# Patient Record
Sex: Female | Born: 1945
Health system: Southern US, Community
[De-identification: ages and names within clinical notes are randomized; demographics above are authoritative.]

## PROBLEM LIST (undated history)

## (undated) DIAGNOSIS — M2040 Other hammer toe(s) (acquired), unspecified foot: Secondary | ICD-10-CM

## (undated) DIAGNOSIS — M169 Osteoarthritis of hip, unspecified: Secondary | ICD-10-CM

## (undated) DIAGNOSIS — I340 Nonrheumatic mitral (valve) insufficiency: Secondary | ICD-10-CM

## (undated) DIAGNOSIS — R269 Unspecified abnormalities of gait and mobility: Secondary | ICD-10-CM

## (undated) DIAGNOSIS — I5042 Chronic combined systolic (congestive) and diastolic (congestive) heart failure: Secondary | ICD-10-CM

## (undated) DIAGNOSIS — I4510 Unspecified right bundle-branch block: Secondary | ICD-10-CM

## (undated) DIAGNOSIS — M722 Plantar fascial fibromatosis: Secondary | ICD-10-CM

## (undated) DIAGNOSIS — E785 Hyperlipidemia, unspecified: Secondary | ICD-10-CM

## (undated) DIAGNOSIS — M1990 Unspecified osteoarthritis, unspecified site: Secondary | ICD-10-CM

## (undated) DIAGNOSIS — Q231 Congenital insufficiency of aortic valve: Secondary | ICD-10-CM

## (undated) DIAGNOSIS — Q2381 Bicuspid aortic valve: Secondary | ICD-10-CM

## (undated) DIAGNOSIS — I83893 Varicose veins of bilateral lower extremities with other complications: Secondary | ICD-10-CM

## (undated) DIAGNOSIS — M19079 Primary osteoarthritis, unspecified ankle and foot: Secondary | ICD-10-CM

## (undated) DIAGNOSIS — I350 Nonrheumatic aortic (valve) stenosis: Secondary | ICD-10-CM

## (undated) DIAGNOSIS — M7742 Metatarsalgia, left foot: Secondary | ICD-10-CM

## (undated) DIAGNOSIS — I2510 Atherosclerotic heart disease of native coronary artery without angina pectoris: Secondary | ICD-10-CM

## (undated) DIAGNOSIS — Z952 Presence of prosthetic heart valve: Secondary | ICD-10-CM

## (undated) HISTORY — DX: Other hammer toe(s) (acquired), unspecified foot: M20.40

## (undated) HISTORY — DX: Varicose veins of bilateral lower extremities with other complications: I83.893

## (undated) HISTORY — PX: BLEPHAROPLASTY: SUR158

## (undated) HISTORY — PX: TONSILLECTOMY: SUR1361

## (undated) HISTORY — DX: Unspecified abnormalities of gait and mobility: R26.9

## (undated) HISTORY — PX: JOINT REPLACEMENT: SHX530

## (undated) HISTORY — DX: Primary osteoarthritis, unspecified ankle and foot: M19.079

## (undated) HISTORY — DX: Plantar fascial fibromatosis: M72.2

## (undated) HISTORY — DX: Unspecified osteoarthritis, unspecified site: M19.90

## (undated) HISTORY — DX: Congenital insufficiency of aortic valve: Q23.1

## (undated) HISTORY — PX: TUBAL LIGATION: SHX77

## (undated) HISTORY — DX: Nonrheumatic mitral (valve) insufficiency: I34.0

## (undated) HISTORY — DX: Metatarsalgia, left foot: M77.42

## (undated) HISTORY — DX: Osteoarthritis of hip, unspecified: M16.9

## (undated) HISTORY — DX: Bicuspid aortic valve: Q23.81

## (undated) HISTORY — DX: Atherosclerotic heart disease of native coronary artery without angina pectoris: I25.10

---

## 1998-09-27 ENCOUNTER — Other Ambulatory Visit: Admission: RE | Admit: 1998-09-27 | Discharge: 1998-09-27 | Payer: Self-pay | Admitting: Obstetrics and Gynecology

## 1999-12-05 ENCOUNTER — Other Ambulatory Visit: Admission: RE | Admit: 1999-12-05 | Discharge: 1999-12-05 | Payer: Self-pay | Admitting: Obstetrics and Gynecology

## 2001-01-09 ENCOUNTER — Other Ambulatory Visit: Admission: RE | Admit: 2001-01-09 | Discharge: 2001-01-09 | Payer: Self-pay | Admitting: Obstetrics and Gynecology

## 2001-10-23 ENCOUNTER — Ambulatory Visit (HOSPITAL_COMMUNITY): Admission: RE | Admit: 2001-10-23 | Discharge: 2001-10-23 | Payer: Self-pay | Admitting: Cardiovascular Disease

## 2002-01-01 ENCOUNTER — Encounter: Admission: RE | Admit: 2002-01-01 | Discharge: 2002-01-01 | Payer: Self-pay | Admitting: Family Medicine

## 2002-01-18 ENCOUNTER — Other Ambulatory Visit: Admission: RE | Admit: 2002-01-18 | Discharge: 2002-01-18 | Payer: Self-pay | Admitting: Obstetrics and Gynecology

## 2002-12-17 ENCOUNTER — Other Ambulatory Visit: Admission: RE | Admit: 2002-12-17 | Discharge: 2002-12-17 | Payer: Self-pay | Admitting: Obstetrics and Gynecology

## 2002-12-28 ENCOUNTER — Encounter: Admission: RE | Admit: 2002-12-28 | Discharge: 2002-12-28 | Payer: Self-pay | Admitting: Sports Medicine

## 2003-12-23 ENCOUNTER — Other Ambulatory Visit: Admission: RE | Admit: 2003-12-23 | Discharge: 2003-12-23 | Payer: Self-pay | Admitting: Obstetrics and Gynecology

## 2004-12-24 ENCOUNTER — Other Ambulatory Visit: Admission: RE | Admit: 2004-12-24 | Discharge: 2004-12-24 | Payer: Self-pay | Admitting: Addiction Medicine

## 2005-12-25 ENCOUNTER — Other Ambulatory Visit: Admission: RE | Admit: 2005-12-25 | Discharge: 2005-12-25 | Payer: Self-pay | Admitting: Obstetrics and Gynecology

## 2007-01-16 ENCOUNTER — Other Ambulatory Visit: Admission: RE | Admit: 2007-01-16 | Discharge: 2007-01-16 | Payer: Self-pay | Admitting: Obstetrics and Gynecology

## 2007-07-24 ENCOUNTER — Telehealth: Payer: Self-pay | Admitting: *Deleted

## 2007-07-28 ENCOUNTER — Ambulatory Visit: Payer: Self-pay | Admitting: Sports Medicine

## 2007-07-28 DIAGNOSIS — M7750 Other enthesopathy of unspecified foot: Secondary | ICD-10-CM | POA: Insufficient documentation

## 2007-07-28 DIAGNOSIS — M2040 Other hammer toe(s) (acquired), unspecified foot: Secondary | ICD-10-CM

## 2007-07-28 DIAGNOSIS — M722 Plantar fascial fibromatosis: Secondary | ICD-10-CM

## 2007-07-28 HISTORY — DX: Plantar fascial fibromatosis: M72.2

## 2007-07-28 HISTORY — DX: Other hammer toe(s) (acquired), unspecified foot: M20.40

## 2008-01-18 ENCOUNTER — Other Ambulatory Visit: Admission: RE | Admit: 2008-01-18 | Discharge: 2008-01-18 | Payer: Self-pay | Admitting: Obstetrics and Gynecology

## 2009-01-19 ENCOUNTER — Ambulatory Visit: Payer: Self-pay | Admitting: Obstetrics and Gynecology

## 2009-01-19 ENCOUNTER — Other Ambulatory Visit: Admission: RE | Admit: 2009-01-19 | Discharge: 2009-01-19 | Payer: Self-pay | Admitting: Obstetrics and Gynecology

## 2009-01-19 ENCOUNTER — Encounter: Payer: Self-pay | Admitting: Obstetrics and Gynecology

## 2010-01-23 ENCOUNTER — Ambulatory Visit: Payer: Self-pay | Admitting: Obstetrics and Gynecology

## 2010-01-23 ENCOUNTER — Other Ambulatory Visit: Admission: RE | Admit: 2010-01-23 | Discharge: 2010-01-23 | Payer: Self-pay | Admitting: Obstetrics and Gynecology

## 2010-02-19 ENCOUNTER — Encounter (INDEPENDENT_AMBULATORY_CARE_PROVIDER_SITE_OTHER): Payer: Self-pay | Admitting: *Deleted

## 2010-02-19 ENCOUNTER — Encounter: Payer: Self-pay | Admitting: Internal Medicine

## 2010-03-19 ENCOUNTER — Encounter (INDEPENDENT_AMBULATORY_CARE_PROVIDER_SITE_OTHER): Payer: Self-pay | Admitting: *Deleted

## 2010-03-22 ENCOUNTER — Ambulatory Visit: Payer: Self-pay | Admitting: Internal Medicine

## 2010-03-28 ENCOUNTER — Telehealth: Payer: Self-pay | Admitting: Internal Medicine

## 2010-04-05 ENCOUNTER — Ambulatory Visit: Payer: Self-pay | Admitting: Internal Medicine

## 2010-10-02 NOTE — Letter (Signed)
Summary: Vianne Bulls MD/[facility] Duane Lope MD/[facility]   Imported By: Lester [place] 04/11/2010 10:21:48  _____________________________________________________________________  External Attachment:    Type:   Image     Comment:   External Document

## 2010-10-02 NOTE — Letter (Signed)
Summary: Previsit letter  [facility]  [address] [place], [place] [address]   Phone: [phone]  Fax: [phone]       02/19/2010 MRN: [id number]  [facility] [address] [place], [place]  [address]  Dear Brittany Shaffer,  Welcome to the Gastroenterology Division at [facility].    You are scheduled to see a nurse for your pre-procedure visit on 03-22-10 at 4:30p.m. on the 3rd floor at [facility], [address]  We ask that you try to arrive at our office 15 minutes prior to your appointment time to allow for check-in.  Your nurse visit will consist of discussing your medical and surgical history, your immediate family medical history, and your medications.    Please bring a complete list of all your medications or, if you prefer, bring the medication bottles and we will list them.  We will need to be aware of both prescribed and over the counter drugs.  We will need to know exact dosage information as well.  If you are on blood thinners (Coumadin, Plavix, Aggrenox, Ticlid, etc.) please call our office today/prior to your appointment, as we need to consult with your physician about holding your medication.   Please be prepared to read and sign documents such as consent forms, a financial agreement, and acknowledgement forms.  If necessary, and with your consent, a friend or relative is welcome to sit-in on the nurse visit with you.  Please bring your insurance card so that we may make a copy of it.  If your insurance requires a referral to see a specialist, please bring your referral form from your primary care physician.  No co-pay is required for this nurse visit.     If you cannot keep your appointment, please call [phone] to cancel or reschedule prior to your appointment date.  This allows [place] the opportunity to schedule an appointment for another patient in need of care.    Thank you for choosing [facility] for your medical needs.  We  appreciate the opportunity to care for you.  Please visit [place] at our website  to learn more about our practice.                     Sincerely.                                                                                                                   The Gastroenterology Division

## 2010-10-02 NOTE — Letter (Signed)
Summary: Miralax Instructions  [facility]  [address]   [place], [place] [address]   Phone: [phone]  Fax: [phone]       Brittany Shaffer    1945/11/13    MRN: [id number]       Procedure Day /Date: Thursday, 04-05-10     Arrival Time:  7:30 a.m.     Procedure Time: 8:00 a.m.     Location of Procedure:                     x  [facility] (4th Floor)    PREPARATION FOR COLONOSCOPY WITH MIRALAX  Starting 5 days prior to your procedure 04-01-10  do not eat nuts, seeds, popcorn, corn, beans, peas,  salads, or any raw vegetables.  Do not take any fiber supplements (e.g. Metamucil, Citrucel, and Benefiber). ____________________________________________________________________________________________________   THE DAY BEFORE YOUR PROCEDURE         DATE: 04-04-10  DAY: Wednesday  1   Drink clear liquids the entire day-NO SOLID FOOD  2   Do not drink anything colored red or purple.  Avoid juices with pulp.  No orange juice.  3   Drink at least 64 oz. (8 glasses) of fluid/clear liquids during the day to prevent dehydration and help the prep work efficiently.  CLEAR LIQUIDS INCLUDE: Water Jello Ice Popsicles Tea (sugar ok, no milk/cream) Powdered fruit flavored drinks Coffee (sugar ok, no milk/cream) Gatorade Juice: apple, white grape, white cranberry  Lemonade Clear bullion, consomm, broth Carbonated beverages (any kind) Strained chicken noodle soup Hard Candy  4   Mix the entire bottle of Miralax with 64 oz. of Gatorade/Powerade in the morning and put in the refrigerator to chill.  5   At 3:00 pm take 2 Dulcolax/Bisacodyl tablets.  6   At 4:30 pm take one Reglan/Metoclopramide tablet.  7  Starting at 5:00 pm drink one 8 oz glass of the Miralax mixture every 15-20 minutes until you have finished drinking the entire 64 oz.  You should finish drinking prep around 7:30 or 8:00 pm.  8   If you are nauseated, you may take the 2nd  Reglan/Metoclopramide tablet at 6:30 pm.        9    At 8:00 pm take 2 more DULCOLAX/Bisacodyl tablets.     THE DAY OF YOUR PROCEDURE      DATE:  04-05-10 DAY: Thursday  You may drink clear liquids until 6:00 a.m.   (2 HOURS BEFORE PROCEDURE).   MEDICATION INSTRUCTIONS  Unless otherwise instructed, you should take regular prescription medications with a small sip of water as early as possible the morning of your procedure.           OTHER INSTRUCTIONS  You will need a responsible adult at least 67 years of age to accompany you and drive you home.   This person must remain in the waiting room during your procedure.  Wear loose fitting clothing that is easily removed.  Leave jewelry and other valuables at home.  However, you may wish to bring a book to read or an iPod/MP3 player to listen to music as you wait for your procedure to start.  Remove all body piercing jewelry and leave at home.  Total time from sign-in until discharge is approximately 2-3 hours.  You should go home directly after your procedure and rest.  You can resume normal activities the day after your procedure.  The day of your procedure you should not:  Drive   Make legal decisions   Operate machinery   Drink alcohol   Return to work  You will receive specific instructions about eating, activities and medications before you leave.   The above instructions have been reviewed and explained to me by   Clide Cliff, RN______________________    I fully understand and can verbalize these instructions _____________________________ Date _______

## 2010-10-02 NOTE — Procedures (Signed)
Summary: Colonoscopy  Patient: [name] Note: All result statuses are Final unless otherwise noted.  Tests: (1) Colonoscopy (COL)   COL Colonoscopy           DONE     [facility]     [address]     [place], [place]  [address]           COLONOSCOPY PROCEDURE REPORT           PATIENT:  Brittany Shaffer, Brittany Shaffer  MR#:  [id number]     BIRTHDATE:  [birth year], 64 yrs. old  GENDER:  female     ENDOSCOPIST:  Hedwig Morton. Juanda Chance, MD     REF. BY:  Duane Lope, M.D.     PROCEDURE DATE:  04/05/2010     PROCEDURE:  Colonoscopy 19147     ASA CLASS:  Class I     INDICATIONS:  Routine Risk Screening     MEDICATIONS:   Versed 9 mg, Fentanyl 75 mcg           DESCRIPTION OF PROCEDURE:   After the risks benefits and     alternatives of the procedure were thoroughly explained, informed     consent was obtained.  Digital rectal exam was performed and     revealed no rectal masses.   The [id number] endoscope was     introduced through the anus and advanced to the cecum, which was     identified by both the appendix and ileocecal valve, without     limitations.  The quality of the prep was good, using MiraLax.     The instrument was then slowly withdrawn as the colon was fully     examined.     <<PROCEDUREIMAGES>>     FINDINGS:  A diminutive polyp was found in the sigmoid colon (see     image3). at 40 cm 1-2 mm tiny polyp seen but was unble to remove     due to scope telescoping, could not find the polyp again  This was     otherwise a normal examination of the colon (see image1,     image2, and image4).   Retroflexed views in the rectum revealed     no abnormalities.    The scope was then withdrawn from the patient     and the procedure completed.           COMPLICATIONS:  None     ENDOSCOPIC IMPRESSION:     1) Diminutive polyp in the sigmoid colon     2) Otherwise normal examination     polyp not removed, lost due to scope telescoping     RECOMMENDATIONS:     1) high fiber diet     REPEAT EXAM:   In 7 year(s) for.  although the polyp was     diminutive, would shorten the recall interval to 7 years           ______________________________     Hedwig Morton. Juanda Chance, MD           CC:           n.     eSIGNED:   Hedwig Morton. Brodie at 04/05/2010 08:40 AM           Stevphen Rochester, [id number]  Note: An exclamation mark (!) indicates a result that was not dispersed into the flowsheet. Document Creation Date: 04/05/2010 8:41 AM _______________________________________________________________________  (1) Order result status: Final Collection or observation date-time: 04/05/2010 08:26 Requested  date-time:  Receipt date-time:  Reported date-time:  Referring Physician:   Ordering Physician: Lina Sar [phone]) Specimen Source:  Source: Launa Grill Order Number: [id number] Lab site:

## 2010-10-02 NOTE — Miscellaneous (Signed)
Summary: direct colon--ch.  Clinical Lists Changes  Medications: Added new medication of MIRALAX   POWD (POLYETHYLENE GLYCOL 3350) As directed - Signed Added new medication of REGLAN 10 MG  TABS (METOCLOPRAMIDE HCL) as directed - Signed Added new medication of DULCOLAX 5 MG  TBEC (BISACODYL) As directed - Signed Rx of MIRALAX   POWD (POLYETHYLENE GLYCOL 3350) As directed;  #255 x 0;  Signed;  Entered by: Clide Cliff RN;  Authorized by: Hart Carwin MD;  Method used: Electronically to [facility] [address] [place], [place]  [address], Ph: [phone] or [phone], Fax: [phone] Rx of REGLAN 10 MG  TABS (METOCLOPRAMIDE HCL) as directed;  #2 x 0;  Signed;  Entered by: Clide Cliff RN;  Authorized by: Hart Carwin MD;  Method used: Electronically to [facility] [address] [place], [place]  [address], Ph: [phone] or [phone], Fax: [phone] Rx of DULCOLAX 5 MG  TBEC (BISACODYL) As directed;  #4 x 0;  Signed;  Entered by: Clide Cliff RN;  Authorized by: Hart Carwin MD;  Method used: Electronically to [facility] [address], [place], [place]  [address], Ph: [phone] or [phone], Fax: [phone] Allergies: Added new allergy or adverse reaction of * LATEX!!!! Observations: Added new observation of ALLERGY REV: Done (03/22/2010 16:18) Added new observation of NKA: F (03/22/2010 16:18)    Prescriptions: DULCOLAX 5 MG  TBEC (BISACODYL) As directed  #4 x 0   Entered by:   Clide Cliff RN   Authorized by:   Hart Carwin MD   Signed by:   Clide Cliff RN on 03/22/2010   Method used:   Electronically to        [facility] (retail)       [address]       [place], [place]  [address]       Ph: [phone] or [phone]       Fax: [phone]   RxID:   [id number] REGLAN 10 MG  TABS (METOCLOPRAMIDE HCL) as directed  #2 x 0   Entered by:   Clide Cliff RN   Authorized by:   Hart Carwin MD   Signed by:   Clide Cliff RN  on 03/22/2010   Method used:   Electronically to        [facility] (retail)       [address]       [place], [place]  [address]       Ph: [phone] or [phone]       Fax: [phone]   RxID:   [id number] MIRALAX   POWD (POLYETHYLENE GLYCOL 3350) As directed  #255 x 0   Entered by:   Clide Cliff RN   Authorized by:   Hart Carwin MD   Signed by:   Clide Cliff RN on 03/22/2010   Method used:   Electronically to        [facility] (retail)       [address]       [place], [place]  [address]       Ph: [phone] or [phone]       Fax: [phone]   RxID:   [id number]

## 2010-10-02 NOTE — Progress Notes (Signed)
Summary: prep ?  Phone Note Call from Patient Call back at Home Phone [phone]   Caller: Patient Call For: Dr. Juanda Chance Reason for Call: Talk to Nurse Summary of Call: prep ? Initial call taken by: Vallarie Mare,  March 28, 2010 4:29 PM  Follow-up for Phone Call        Spoke with patient about prep. she was concern that she did not get right dose of miralax because she got generic form. after asking questions about the med. it seems she did get the right amt. explained to her to mix entire bottle of miralax(generic form) into the 64oz bottle of gateraide. she seems to understand. Follow-up by: Sherren Kerns RN,  March 28, 2010 5:07 PM

## 2011-01-18 NOTE — Procedures (Signed)
[place]  Patient:    Brittany Shaffer, Brittany Shaffer Visit Number: [id number] MRN: [id number]          Service Type: END Location: ENDO Attending Physician:  Berry, Jonathan [place] Dictated by:   Darlin Priestly, M.D. Proc. Date: 10/23/01 Admit Date:  10/23/2001   CC:         Runell Gess, M.D.   Procedure Report  PROCEDURES: Transesophageal echocardiography.  INDICATIONS: The patient is a 65 year old female, patient of Dr. Nanetta Batty, with a questionable history of valvular disease on 2-D echocardiogram in our office suggesting a possible bicuspid aortic valve with mobile density beneath the aortic valve. The patient is now brought for TEE to redefine her aortic valve and questionable subvalvular mass.  DESCRIPTION OF OPERATION: After given informed written consent, the patient was brought to the endoscopy suite in a fasting state. The patient then received 75 mg of Demerol and 2 mg of Versed. After adequate anesthesia, the patient then underwent successful and uncomplicated transesophageal echocardiogram.  There was mild left ventricular hypertrophy with low-normal EF, estimated EF of 50-55%. There were no regional wall motion abnormalities noted.  The aortic valve did appear bicuspid and it was mildly thickened. There was no evidence of significant aortic stenosis with a primarily aortic valve area of 3.4 cm sq, and a mean gradient of 9 mmHg. There appeared to be a trivial to mild aortic regurgitation.  There was noted to be approximately a 1.9 cm subaortic valve membrane which had not appeared to have any significant gradient across it. This appeared to be attached to the ventricular aspect of the anterior mitral valve leaflet.  The mitral was mildly thickened with mild mitral regurgitation. There did not appear to be any mitral valve ______. There was, however, redundant subvalvular chordae noted. As previously stated, the probable  subaortic valve membrane did attach to the base of the anterior mitral valve leaflet.  The tricuspid valve appears to be mildly thickened with mild tricuspid regurgitation. There was again noted to be a redundant chordae.  There is no evidence of intracardiac thrombus or mass noted other than as previously stated.  There is no evidence of patent foramen ovale by color or echocardiogram contrast.  There is normal descending thoracic aorta.  CONCLUSIONS: Successful transesophageal echocardiography with findings as noted above. Dictated by:   Darlin Priestly, M.D. Attending Physician:  Berry, Jonathan [place] DD:  10/23/01 TD:  10/24/01 Job: [id number]

## 2011-01-29 ENCOUNTER — Encounter (INDEPENDENT_AMBULATORY_CARE_PROVIDER_SITE_OTHER): Payer: Medicare Other | Admitting: Obstetrics and Gynecology

## 2011-01-29 DIAGNOSIS — N952 Postmenopausal atrophic vaginitis: Secondary | ICD-10-CM

## 2011-01-29 DIAGNOSIS — N951 Menopausal and female climacteric states: Secondary | ICD-10-CM

## 2011-01-29 DIAGNOSIS — R82998 Other abnormal findings in urine: Secondary | ICD-10-CM

## 2011-01-29 DIAGNOSIS — N950 Postmenopausal bleeding: Secondary | ICD-10-CM

## 2012-01-12 ENCOUNTER — Other Ambulatory Visit: Payer: Self-pay | Admitting: Obstetrics and Gynecology

## 2012-01-15 ENCOUNTER — Other Ambulatory Visit: Payer: Self-pay | Admitting: Obstetrics and Gynecology

## 2012-01-24 ENCOUNTER — Encounter: Payer: Self-pay | Admitting: Gynecology

## 2012-01-24 DIAGNOSIS — M179 Osteoarthritis of knee, unspecified: Secondary | ICD-10-CM | POA: Insufficient documentation

## 2012-01-24 DIAGNOSIS — M1710 Unilateral primary osteoarthritis, unspecified knee: Secondary | ICD-10-CM | POA: Insufficient documentation

## 2012-02-04 ENCOUNTER — Encounter: Payer: Self-pay | Admitting: Obstetrics and Gynecology

## 2012-02-04 ENCOUNTER — Ambulatory Visit (INDEPENDENT_AMBULATORY_CARE_PROVIDER_SITE_OTHER): Payer: Medicare Other | Admitting: Obstetrics and Gynecology

## 2012-02-04 VITALS — BP 122/76 | Ht 67.0 in | Wt 166.0 lb

## 2012-02-04 DIAGNOSIS — R1900 Intra-abdominal and pelvic swelling, mass and lump, unspecified site: Secondary | ICD-10-CM

## 2012-02-04 DIAGNOSIS — G479 Sleep disorder, unspecified: Secondary | ICD-10-CM

## 2012-02-04 DIAGNOSIS — M2550 Pain in unspecified joint: Secondary | ICD-10-CM

## 2012-02-04 DIAGNOSIS — Z780 Asymptomatic menopausal state: Secondary | ICD-10-CM

## 2012-02-04 MED ORDER — MEDROXYPROGESTERONE ACETATE 5 MG PO TABS: 5.0000 mg | ORAL_TABLET | ORAL | Status: DC

## 2012-02-04 MED ORDER — ZOLPIDEM TARTRATE 10 MG PO TABS: 10.0000 mg | ORAL_TABLET | Freq: Every evening | ORAL | Status: DC | PRN

## 2012-02-04 MED ORDER — ESTRADIOL 0.025 MG/24HR TD PTWK: 1.0000 | MEDICATED_PATCH | TRANSDERMAL | Status: DC

## 2012-02-04 NOTE — Progress Notes (Signed)
Patient came to see me today for followup. She remains on her HRT for persistent menopausal symptoms. She gets complete relief with her patch. She is not having withdrawal bleeding. She only takes up 0.025 mg patch. She is also having joint pain and I suspect would be worse without her HRT. She is having no vaginal bleeding. She is having no vaginal dryness. She is overdue for mammogram. She and her last bone density in 2010 which was normal and had a previous normal 1. She has had normal Pap smears her entire life. She does have sleep disturbance and we support her with Ambien with good results. She is having no dysuria, frequency of urination, urgency of urination, or incontinence.  ROS: 12 system review done. Pertinent positives above. Other positives include  metatarsalgia, plantar fasciitis,and osteoarthritis.  Physical examination: Kennon Portela present. HEENT within normal limits. Neck: Thyroid not large. No masses. Supraclavicular nodes: not enlarged. Breasts: Examined in both sitting and lying  position. No skin changes and no masses. Abdomen: Soft no guarding rebound or masses or hernia. Pelvic: External: Within normal limits. BUS: Within normal limits. Vaginal:within normal limits. Good estrogen effect. No evidence of cystocele rectocele or enterocele. Cervix: clean. Uterus: retroverted with the sensation that there is either a mass on the uterus or in the cul-de-sac that is firm of approximately 4 cm. Rectovaginal exam: Confirmatory and negative. Extremities: Within normal limits.  Assessment: #1. Menopausal symptoms #2. Sleep disturbance #3. Joint pain #4. Possible pelvic mass  Plan: We had her usual discussion regarding the higher risk of breast cancer with long-term use of hormone replacement. She feels that benefits outweighed the risks. She will continue her HRT. We scheduled a pelvic ultrasound due to mass felt. We discussed that estrogen is helpful with joint pain. We refilled her  Ambien.

## 2012-02-09 ENCOUNTER — Other Ambulatory Visit: Payer: Self-pay | Admitting: Obstetrics and Gynecology

## 2012-02-20 ENCOUNTER — Ambulatory Visit (INDEPENDENT_AMBULATORY_CARE_PROVIDER_SITE_OTHER): Payer: Medicare Other

## 2012-02-20 ENCOUNTER — Other Ambulatory Visit: Payer: Self-pay | Admitting: Obstetrics and Gynecology

## 2012-02-20 ENCOUNTER — Encounter: Payer: Self-pay | Admitting: Obstetrics and Gynecology

## 2012-02-20 ENCOUNTER — Ambulatory Visit (INDEPENDENT_AMBULATORY_CARE_PROVIDER_SITE_OTHER): Payer: Medicare Other | Admitting: Obstetrics and Gynecology

## 2012-02-20 DIAGNOSIS — N83339 Acquired atrophy of ovary and fallopian tube, unspecified side: Secondary | ICD-10-CM

## 2012-02-20 DIAGNOSIS — R1904 Left lower quadrant abdominal swelling, mass and lump: Secondary | ICD-10-CM

## 2012-02-20 DIAGNOSIS — D4959 Neoplasm of unspecified behavior of other genitourinary organ: Secondary | ICD-10-CM

## 2012-02-20 DIAGNOSIS — R188 Other ascites: Secondary | ICD-10-CM

## 2012-02-20 DIAGNOSIS — D3910 Neoplasm of uncertain behavior of unspecified ovary: Secondary | ICD-10-CM

## 2012-02-20 DIAGNOSIS — R1900 Intra-abdominal and pelvic swelling, mass and lump, unspecified site: Secondary | ICD-10-CM

## 2012-02-20 NOTE — Progress Notes (Signed)
Patient came to see me today for an ultrasound after a pelvic exam where I felt a cul-de-sac mass. On ultrasound today her uterus is anteverted with an endometrial echo 4.8 mm. Her right ovary is atrophic within normal echo pattern. Her left ovary cannot be seen. There is however a left midline mass of 14 x 7 x 10 cm with positive flow Doppler within the mass. The mass has both cystic and solid areas. There is free fluid in the cul-de-sac and surrounding the uterus.  Assessment: Pelvic mass  Plan: CA 125 drawn. Patient referred to GYN oncologist. Patient sister had ovarian cancer and we discussed that she should have both her ovaries removed.

## 2012-02-21 LAB — CA 125: CA 125: 18.1 U/mL (ref 0.0–30.2)

## 2012-02-24 ENCOUNTER — Telehealth: Payer: Self-pay | Admitting: *Deleted

## 2012-02-24 NOTE — Telephone Encounter (Signed)
Lm for patient to call.  Has appt set up with Dr. Stanford Breed on 02/28/12 @ 9am... Needs to arrive 8:30am for check in.

## 2012-02-24 NOTE — Telephone Encounter (Signed)
Patient informed appt information. 

## 2012-02-27 ENCOUNTER — Encounter (HOSPITAL_COMMUNITY): Payer: Self-pay | Admitting: Pharmacy Technician

## 2012-02-28 ENCOUNTER — Encounter (HOSPITAL_COMMUNITY)
Admission: RE | Admit: 2012-02-28 | Discharge: 2012-02-28 | Disposition: A | Discharge status: Home / Self Care | Payer: Medicare Other | Source: Ambulatory Visit | Attending: Obstetrics & Gynecology | Admitting: Obstetrics & Gynecology

## 2012-02-28 ENCOUNTER — Encounter (HOSPITAL_COMMUNITY): Payer: Self-pay

## 2012-02-28 ENCOUNTER — Ambulatory Visit (HOSPITAL_COMMUNITY)
Admission: RE | Admit: 2012-02-28 | Discharge: 2012-02-28 | Disposition: A | Discharge status: Home / Self Care | Payer: Medicare Other | Source: Ambulatory Visit | Attending: Obstetrics & Gynecology | Admitting: Obstetrics & Gynecology

## 2012-02-28 ENCOUNTER — Ambulatory Visit: Payer: Medicare Other | Attending: Gynecology | Admitting: Gynecology

## 2012-02-28 ENCOUNTER — Encounter: Payer: Self-pay | Admitting: Gynecology

## 2012-02-28 VITALS — BP 128/80 | HR 78 | Temp 98.0°F | Resp 16 | Ht 67.13 in | Wt 163.9 lb

## 2012-02-28 DIAGNOSIS — Z801 Family history of malignant neoplasm of trachea, bronchus and lung: Secondary | ICD-10-CM | POA: Insufficient documentation

## 2012-02-28 DIAGNOSIS — Z01810 Encounter for preprocedural cardiovascular examination: Secondary | ICD-10-CM | POA: Insufficient documentation

## 2012-02-28 DIAGNOSIS — Z8249 Family history of ischemic heart disease and other diseases of the circulatory system: Secondary | ICD-10-CM | POA: Insufficient documentation

## 2012-02-28 DIAGNOSIS — Z01812 Encounter for preprocedural laboratory examination: Secondary | ICD-10-CM | POA: Insufficient documentation

## 2012-02-28 DIAGNOSIS — Z8041 Family history of malignant neoplasm of ovary: Secondary | ICD-10-CM | POA: Insufficient documentation

## 2012-02-28 DIAGNOSIS — M1990 Unspecified osteoarthritis, unspecified site: Secondary | ICD-10-CM | POA: Insufficient documentation

## 2012-02-28 DIAGNOSIS — J45909 Unspecified asthma, uncomplicated: Secondary | ICD-10-CM | POA: Insufficient documentation

## 2012-02-28 DIAGNOSIS — Z808 Family history of malignant neoplasm of other organs or systems: Secondary | ICD-10-CM | POA: Insufficient documentation

## 2012-02-28 DIAGNOSIS — Z803 Family history of malignant neoplasm of breast: Secondary | ICD-10-CM | POA: Insufficient documentation

## 2012-02-28 DIAGNOSIS — R1900 Intra-abdominal and pelvic swelling, mass and lump, unspecified site: Secondary | ICD-10-CM | POA: Insufficient documentation

## 2012-02-28 DIAGNOSIS — Z01818 Encounter for other preprocedural examination: Secondary | ICD-10-CM | POA: Insufficient documentation

## 2012-02-28 DIAGNOSIS — Z885 Allergy status to narcotic agent status: Secondary | ICD-10-CM | POA: Insufficient documentation

## 2012-02-28 DIAGNOSIS — Z833 Family history of diabetes mellitus: Secondary | ICD-10-CM | POA: Insufficient documentation

## 2012-02-28 DIAGNOSIS — I498 Other specified cardiac arrhythmias: Secondary | ICD-10-CM | POA: Insufficient documentation

## 2012-02-28 LAB — COMPREHENSIVE METABOLIC PANEL
ALT: 11 U/L (ref 0–35)
Albumin: 4.1 g/dL (ref 3.5–5.2)
Alkaline Phosphatase: 58 U/L (ref 39–117)
Calcium: 9.6 mg/dL (ref 8.4–10.5)
GFR calc Af Amer: 79 mL/min — ABNORMAL LOW (ref >90)
Glucose, Bld: 98 mg/dL (ref 70–99)
Potassium: 4.5 mEq/L (ref 3.5–5.1)
Sodium: 137 mEq/L (ref 135–145)
Total Protein: 7.3 g/dL (ref 6.0–8.3)

## 2012-02-28 LAB — CBC
MCH: 33.5 pg (ref 26.0–34.0)
MCHC: 33.6 g/dL (ref 30.0–36.0)
MCV: 99.8 fL (ref 78.0–100.0)
Platelets: 244 10*3/uL (ref 150–400)
RDW: 12.8 % (ref 11.5–15.5)

## 2012-02-28 LAB — DIFFERENTIAL
Basophils Relative: 0 % (ref 0–1)
Eosinophils Absolute: 0.1 10*3/uL (ref 0.0–0.7)
Eosinophils Relative: 1 % (ref 0–5)
Lymphs Abs: 1.5 10*3/uL (ref 0.7–4.0)
Neutrophils Relative %: 66 % (ref 43–77)

## 2012-02-28 LAB — ABO/RH: ABO/RH(D): O POS

## 2012-02-28 LAB — SURGICAL PCR SCREEN: MRSA, PCR: NEGATIVE

## 2012-02-28 NOTE — Progress Notes (Signed)
Consult Note: Gyn-Onc   Brittany Shaffer 66 y.o. female  Chief Complaint  Patient presents with  . Pelvic mass    New pt       HPI 66 year old white female seen in consultation request of Dr. Edyth Gunnels regarding management of a newly diagnosed pelvic mass.  The mass is asymptomatic and found on routine pelvic exam.  Subsequent ultrasound shows a mass measuring 14 x 10x7 cm which is complex with solid and cystic areas.  There is free fluid noted in the pelvis.  CA125 was 18.1.   The patient has no other gyn history.  She uses a estraderm patch.   Her sister developed ovarian cancer at age 45.  She is still alive at age 47. The patient denies any GU or GI or pelvic symptoms.  She is very active playing tennis several times a week.    Review of Systems:10 point review of systems is negative as noted above.   Vitals: Blood pressure 128/80, pulse 78, temperature 98 F (36.7 C), temperature source Oral, resp. rate 16, height 5' 7.13" (1.705 m), weight 163 lb 14.4 oz (74.345 kg).  Physical Exam: General : The patient is a healthy woman in no acute distress.  HEENT: normocephalic, extraoccular movements normal; neck is supple without thyromegally  Lynphnodes: Supraclavicular and inguinal nodes not enlarged  Abdomen: Soft, non-tender, no ascites, no organomegally, no masses, no hernias  Pelvic:  EGBUS: Normal female  Vagina: Normal, no lesions  Urethra and Bladder: Normal, non-tender  Cervix:  Normal Uterus: Anterior normal shape size and consistency.   Bi-manual examination: There is a firm cystic mass filling the posterior pelvis pushing the uterus anteriorly. This has some mobility.  Rectal: normal sphincter tone, there is no cul-de-sac nodularity Lower extremities: No edema or varicosities. Normal range of motion    Assessment/Plan: Complex large pelvic mass with normal CA 125. The patient's records have been reviewed as well as the ultrasound. There is certainly some concern for  malignancy especially based on the ultrasound characteristics and evidence of free fluid.  Detailed discussion with the patient regarding the differential diagnosis. We would recommend she undergo exploratory laparotomy resection the mass with intraoperative frozen section to guide further therapy. If this is benign we plan on performing a total abdominal hysterectomy and bilateral salpingo-oophorectomy. If it is a malignancy then further surgical staging and/or debulking would be performed at the same operation. The patient is aware of the risks of surgery including hemorrhage, infection, injury to adjacent viscera, and anesthetic risks. All of her questions are answered. She will be seen in preop evaluation today and surgery is scheduled for July 2  Allergies  Allergen Reactions  . Codeine     Hallucinations    Past Medical History  Diagnosis Date  . Arthritis   . Rash     Past Surgical History  Procedure Date  . Tubal ligation   . Eye lid surg     Current Outpatient Prescriptions  Medication Sig Dispense Refill  . ciclopirox (LOPROX) 0.77 % cream Apply 1 application topically 2 (two) times daily.      . diphenhydrAMINE (BENADRYL) 25 MG tablet Take 25 mg by mouth at bedtime as needed. For rash under breast and on stomach      . estradiol (CLIMARA - DOSED IN MG/24 HR) 0.025 mg/24hr Place 1 patch onto the skin once a week. On Saturdays      . etodolac (LODINE) 500 MG tablet Take 500 mg by mouth  daily with breakfast.      . medroxyPROGESTERone (PROVERA) 5 MG tablet Take 1 tablet (5 mg total) by mouth as directed. Days 1-12  12 tablet  12  . zolpidem (AMBIEN) 10 MG tablet Take 10 mg by mouth at bedtime as needed. For sleep        History   Social History  . Marital Status: Married    Spouse Name: N/A    Number of Children: N/A  . Years of Education: N/A   Occupational History  . Not on file.   Social History Main Topics  . Smoking status: Never Smoker   . Smokeless tobacco:  Not on file  . Alcohol Use: 3.5 oz/week    7 drink(s) per week  . Drug Use: Not on file  . Sexually Active: No   Other Topics Concern  . Not on file   Social History Narrative  . No narrative on file    Family History  Problem Relation Age of Onset  . Hypertension Mother   . Bone cancer Father   . Lung cancer Father   . Ovarian cancer Sister   . Diabetes Maternal Aunt   . Breast cancer Maternal Aunt     Age 90's      Jeannette Corpus, MD 02/28/2012, 2:50 PM

## 2012-02-28 NOTE — Patient Instructions (Signed)
Surgery is scheduled for July 2. He will be seen for preoperative evaluation today.

## 2012-02-28 NOTE — Patient Instructions (Signed)
18 Brittany Shaffer  02/28/2012   Your procedure is scheduled on:  03/03/12 AT 11:45 AM  Report to SHORT STAY DEPT  at 9:15 AM.  Call this number if you have problems the morning of surgery: [phone]   Remember:   Do not eat food or drink liquids AFTER MIDNIGHT    Take these medicines the morning of surgery with A SIP OF WATER: NONE   Do not wear jewelry, make-up or nail polish.  Do not wear lotions, powders, or perfumes.   Do not shave legs or underarms 48 hrs. before surgery (men may shave face)  Do not bring valuables to the hospital.  Contacts, dentures or bridgework may not be worn into surgery.  Leave suitcase in the car. After surgery it may be brought to your room.  For patients admitted to the hospital, checkout time is 11:00 AM the day of discharge.   Patients discharged the day of surgery will not be allowed to drive home.    Special Instructions:   Please read over the following fact sheets that you were given: MRSA  Information / Incentive Spirometer               SHOWER WITH BETASEPT THE NIGHT BEFORE SURGERY AND THE MORNING OF SURGERY

## 2012-03-02 MED ORDER — DEXTROSE 5 % IV SOLN
2.0000 g | INTRAVENOUS | Status: AC
  Administered 2012-03-03: 2 g via INTRAVENOUS
  Filled 2012-03-02: qty 2

## 2012-03-03 ENCOUNTER — Encounter (HOSPITAL_COMMUNITY): Payer: Self-pay | Admitting: Anesthesiology

## 2012-03-03 ENCOUNTER — Encounter (HOSPITAL_COMMUNITY): Payer: Self-pay | Admitting: *Deleted

## 2012-03-03 ENCOUNTER — Inpatient Hospital Stay (HOSPITAL_COMMUNITY)
Admission: RE | Admit: 2012-03-03 | Discharge: 2012-03-05 | DRG: 742 | Disposition: A | Discharge status: Home / Self Care | Payer: Medicare Other | Source: Ambulatory Visit | Attending: Obstetrics & Gynecology | Admitting: Obstetrics & Gynecology

## 2012-03-03 ENCOUNTER — Encounter (HOSPITAL_COMMUNITY)
Admission: RE | Disposition: A | Discharge status: Home / Self Care | Payer: Self-pay | Source: Ambulatory Visit | Attending: Obstetrics & Gynecology

## 2012-03-03 ENCOUNTER — Ambulatory Visit (HOSPITAL_COMMUNITY): Payer: Medicare Other | Admitting: Anesthesiology

## 2012-03-03 DIAGNOSIS — Z8041 Family history of malignant neoplasm of ovary: Secondary | ICD-10-CM

## 2012-03-03 DIAGNOSIS — D279 Benign neoplasm of unspecified ovary: Principal | ICD-10-CM | POA: Diagnosis present

## 2012-03-03 DIAGNOSIS — R1900 Intra-abdominal and pelvic swelling, mass and lump, unspecified site: Secondary | ICD-10-CM

## 2012-03-03 DIAGNOSIS — Z79890 Hormone replacement therapy (postmenopausal): Secondary | ICD-10-CM

## 2012-03-03 DIAGNOSIS — E871 Hypo-osmolality and hyponatremia: Secondary | ICD-10-CM | POA: Diagnosis not present

## 2012-03-03 HISTORY — PX: SALPINGOOPHORECTOMY: SHX82

## 2012-03-03 HISTORY — PX: LAPAROTOMY: SHX154

## 2012-03-03 HISTORY — PX: ABDOMINAL HYSTERECTOMY: SHX81

## 2012-03-03 LAB — TYPE AND SCREEN: ABO/RH(D): O POS

## 2012-03-03 SURGERY — LAPAROTOMY, EXPLORATORY
Anesthesia: General | Wound class: Clean Contaminated

## 2012-03-03 MED ORDER — CICLOPIROX OLAMINE 0.77 % EX CREA: 1.0000 "application " | TOPICAL_CREAM | Freq: Two times a day (BID) | CUTANEOUS | Status: DC

## 2012-03-03 MED ORDER — PROMETHAZINE HCL 25 MG/ML IJ SOLN
6.2500 mg | INTRAMUSCULAR | Status: DC | PRN
  Administered 2012-03-03: 6.25 mg via INTRAVENOUS

## 2012-03-03 MED ORDER — ROCURONIUM BROMIDE 100 MG/10ML IV SOLN
INTRAVENOUS | Status: DC | PRN
  Administered 2012-03-03: 40 mg via INTRAVENOUS
  Administered 2012-03-03: 10 mg via INTRAVENOUS

## 2012-03-03 MED ORDER — KETOROLAC TROMETHAMINE 15 MG/ML IJ SOLN: 15.0000 mg | Freq: Four times a day (QID) | INTRAMUSCULAR | Status: DC | PRN

## 2012-03-03 MED ORDER — KCL IN DEXTROSE-NACL 20-5-0.45 MEQ/L-%-% IV SOLN
INTRAVENOUS | Status: AC
  Administered 2012-03-03: 1000 mL
  Filled 2012-03-03: qty 1000

## 2012-03-03 MED ORDER — HYDROMORPHONE HCL PF 1 MG/ML IJ SOLN
0.2500 mg | INTRAMUSCULAR | Status: DC | PRN
  Administered 2012-03-03 (×4): 0.5 mg via INTRAVENOUS

## 2012-03-03 MED ORDER — ONDANSETRON HCL 4 MG/2ML IJ SOLN
4.0000 mg | Freq: Four times a day (QID) | INTRAMUSCULAR | Status: DC | PRN
  Filled 2012-03-03: qty 2

## 2012-03-03 MED ORDER — DEXAMETHASONE SODIUM PHOSPHATE 10 MG/ML IJ SOLN
INTRAMUSCULAR | Status: DC | PRN
  Administered 2012-03-03: 10 mg via INTRAVENOUS

## 2012-03-03 MED ORDER — DIPHENHYDRAMINE HCL 50 MG/ML IJ SOLN: 12.5000 mg | Freq: Four times a day (QID) | INTRAMUSCULAR | Status: DC | PRN

## 2012-03-03 MED ORDER — PROMETHAZINE HCL 25 MG/ML IJ SOLN
INTRAMUSCULAR | Status: AC
  Filled 2012-03-03: qty 1

## 2012-03-03 MED ORDER — NALOXONE HCL 0.4 MG/ML IJ SOLN: 0.4000 mg | INTRAMUSCULAR | Status: DC | PRN

## 2012-03-03 MED ORDER — DIPHENHYDRAMINE HCL 12.5 MG/5ML PO ELIX: 12.5000 mg | ORAL_SOLUTION | Freq: Four times a day (QID) | ORAL | Status: DC | PRN

## 2012-03-03 MED ORDER — ACETAMINOPHEN 10 MG/ML IV SOLN
1000.0000 mg | Freq: Once | INTRAVENOUS | Status: AC
  Administered 2012-03-03: 1000 mg via INTRAVENOUS
  Filled 2012-03-03: qty 100

## 2012-03-03 MED ORDER — SODIUM CHLORIDE 0.9 % IJ SOLN: 9.0000 mL | INTRAMUSCULAR | Status: DC | PRN

## 2012-03-03 MED ORDER — ZOLPIDEM TARTRATE 5 MG PO TABS
5.0000 mg | ORAL_TABLET | Freq: Every evening | ORAL | Status: DC | PRN
  Administered 2012-03-05: 5 mg via ORAL
  Filled 2012-03-03 (×2): qty 1

## 2012-03-03 MED ORDER — BUPIVACAINE LIPOSOME 1.3 % IJ SUSP
20.0000 mL | INTRAMUSCULAR | Status: AC
  Administered 2012-03-03: 20 mL
  Filled 2012-03-03: qty 20

## 2012-03-03 MED ORDER — ONDANSETRON HCL 4 MG/2ML IJ SOLN
INTRAMUSCULAR | Status: DC | PRN
  Administered 2012-03-03: 4 mg via INTRAVENOUS

## 2012-03-03 MED ORDER — LACTATED RINGERS IV SOLN
INTRAVENOUS | Status: DC
Start: 2012-03-03 — End: 2012-03-03
  Administered 2012-03-03: 12:00:00 via INTRAVENOUS
  Administered 2012-03-03: 1000 mL via INTRAVENOUS

## 2012-03-03 MED ORDER — ONDANSETRON HCL 4 MG/2ML IJ SOLN
4.0000 mg | Freq: Four times a day (QID) | INTRAMUSCULAR | Status: DC | PRN
  Administered 2012-03-03 – 2012-03-04 (×2): 4 mg via INTRAVENOUS
  Filled 2012-03-03: qty 2

## 2012-03-03 MED ORDER — MIDAZOLAM HCL 5 MG/5ML IJ SOLN
INTRAMUSCULAR | Status: DC | PRN
  Administered 2012-03-03: 2 mg via INTRAVENOUS

## 2012-03-03 MED ORDER — HYDROMORPHONE 0.3 MG/ML IV SOLN
INTRAVENOUS | Status: DC
  Administered 2012-03-03: 0.9 mg via INTRAVENOUS
  Administered 2012-03-03: 1.19 mg via INTRAVENOUS
  Administered 2012-03-03: 0.3 mg via INTRAVENOUS
  Administered 2012-03-04 (×2): 0.799 mg via INTRAVENOUS
  Administered 2012-03-04: 0.999 mg via INTRAVENOUS
  Administered 2012-03-04: 0.4 mg via INTRAVENOUS

## 2012-03-03 MED ORDER — HYDROMORPHONE HCL PF 1 MG/ML IJ SOLN
INTRAMUSCULAR | Status: AC
  Filled 2012-03-03: qty 1

## 2012-03-03 MED ORDER — ONDANSETRON HCL 4 MG PO TABS: 4.0000 mg | ORAL_TABLET | Freq: Four times a day (QID) | ORAL | Status: DC | PRN

## 2012-03-03 MED ORDER — GLYCOPYRROLATE 0.2 MG/ML IJ SOLN
INTRAMUSCULAR | Status: DC | PRN
  Administered 2012-03-03: .5 mg via INTRAVENOUS

## 2012-03-03 MED ORDER — FENTANYL CITRATE 0.05 MG/ML IJ SOLN
INTRAMUSCULAR | Status: DC | PRN
  Administered 2012-03-03 (×3): 50 ug via INTRAVENOUS
  Administered 2012-03-03: 100 ug via INTRAVENOUS
  Administered 2012-03-03: 50 ug via INTRAVENOUS
  Administered 2012-03-03: 100 ug via INTRAVENOUS
  Administered 2012-03-03: 50 ug via INTRAVENOUS

## 2012-03-03 MED ORDER — HYDROMORPHONE 0.3 MG/ML IV SOLN
INTRAVENOUS | Status: AC
  Filled 2012-03-03: qty 25

## 2012-03-03 MED ORDER — OXYCODONE-ACETAMINOPHEN 5-325 MG PO TABS
1.0000 | ORAL_TABLET | ORAL | Status: DC | PRN
  Filled 2012-03-03 (×2): qty 1

## 2012-03-03 MED ORDER — PROPOFOL 10 MG/ML IV BOLUS
INTRAVENOUS | Status: DC | PRN
  Administered 2012-03-03: 120 mg via INTRAVENOUS

## 2012-03-03 MED ORDER — NEOSTIGMINE METHYLSULFATE 1 MG/ML IJ SOLN
INTRAMUSCULAR | Status: DC | PRN
  Administered 2012-03-03: 4 mg via INTRAVENOUS

## 2012-03-03 MED ORDER — MAGNESIUM HYDROXIDE 400 MG/5ML PO SUSP
30.0000 mL | Freq: Three times a day (TID) | ORAL | Status: DC
  Administered 2012-03-04: 30 mL via ORAL
  Filled 2012-03-03 (×2): qty 30

## 2012-03-03 MED ORDER — ACETAMINOPHEN 10 MG/ML IV SOLN
INTRAVENOUS | Status: AC
  Filled 2012-03-03: qty 100

## 2012-03-03 MED ORDER — KCL IN DEXTROSE-NACL 20-5-0.45 MEQ/L-%-% IV SOLN
INTRAVENOUS | Status: DC
  Administered 2012-03-03 – 2012-03-04 (×2): via INTRAVENOUS
  Filled 2012-03-03 (×4): qty 1000

## 2012-03-03 SURGICAL SUPPLY — 45 items
ATTRACTOMAT 16X20 MAGNETIC DRP (DRAPES) ×3 IMPLANT
BAG URINE DRAINAGE (UROLOGICAL SUPPLIES) IMPLANT
BLADE EXTENDED COATED 6.5IN (ELECTRODE) ×3 IMPLANT
CANISTER SUCTION 2500CC (MISCELLANEOUS) IMPLANT
CHLORAPREP W/TINT 10.5 ML (MISCELLANEOUS) ×3 IMPLANT
CLIP TI MEDIUM LARGE 6 (CLIP) IMPLANT
CLOTH BEACON ORANGE TIMEOUT ST (SAFETY) ×3 IMPLANT
COVER SURGICAL LIGHT HANDLE (MISCELLANEOUS) ×3 IMPLANT
DRAPE UTILITY 15X26 (DRAPE) ×3 IMPLANT
DRAPE UTILITY XL STRL (DRAPES) ×3 IMPLANT
DRAPE WARM FLUID 44X44 (DRAPE) ×3 IMPLANT
ELECT BLADE 6.5 EXT (BLADE) ×3 IMPLANT
ELECT REM PT RETURN 9FT ADLT (ELECTROSURGICAL) ×3
ELECTRODE REM PT RTRN 9FT ADLT (ELECTROSURGICAL) ×2 IMPLANT
GAUZE SPONGE 4X4 16PLY XRAY LF (GAUZE/BANDAGES/DRESSINGS) IMPLANT
GLOVE BIO SURGEON STRL SZ 6.5 (GLOVE) ×3 IMPLANT
GLOVE BIO SURGEON STRL SZ7.5 (GLOVE) ×9 IMPLANT
GLOVE BIOGEL M STRL SZ7.5 (GLOVE) ×15 IMPLANT
GOWN PREVENTION PLUS LG XLONG (DISPOSABLE) ×3 IMPLANT
GOWN PREVENTION PLUS XLARGE (GOWN DISPOSABLE) ×3 IMPLANT
GOWN STRL NON-REIN LRG LVL3 (GOWN DISPOSABLE) ×3 IMPLANT
GOWN STRL REIN XL XLG (GOWN DISPOSABLE) ×3 IMPLANT
NS IRRIG 1000ML POUR BTL (IV SOLUTION) ×6 IMPLANT
PACK ABDOMINAL WL (CUSTOM PROCEDURE TRAY) ×3 IMPLANT
SHEET LAVH (DRAPES) ×3 IMPLANT
SPONGE LAP 18X18 X RAY DECT (DISPOSABLE) ×3 IMPLANT
STAPLER SKIN PROX WIDE 3.9 (STAPLE) ×3 IMPLANT
STAPLER VISISTAT 35W (STAPLE) ×3 IMPLANT
SUT ETHILON 1 LR 30 (SUTURE) IMPLANT
SUT PDS AB 1 CTXB1 36 (SUTURE) ×6 IMPLANT
SUT SILK 2 0 (SUTURE) ×1
SUT SILK 2 0 30  PSL (SUTURE)
SUT SILK 2 0 30 PSL (SUTURE) IMPLANT
SUT SILK 2-0 18XBRD TIE 12 (SUTURE) ×2 IMPLANT
SUT VIC AB 0 CT1 36 (SUTURE) ×12 IMPLANT
SUT VIC AB 2-0 CT2 27 (SUTURE) ×21 IMPLANT
SUT VIC AB 2-0 SH 27 (SUTURE) ×2
SUT VIC AB 2-0 SH 27X BRD (SUTURE) ×4 IMPLANT
SUT VIC AB 3-0 CTX 36 (SUTURE) IMPLANT
SUT VICRYL 2 0 18  UND BR (SUTURE) ×1
SUT VICRYL 2 0 18 UND BR (SUTURE) ×2 IMPLANT
TOWEL OR 17X26 10 PK STRL BLUE (TOWEL DISPOSABLE) ×3 IMPLANT
TOWEL OR NON WOVEN STRL DISP B (DISPOSABLE) ×3 IMPLANT
TRAY FOLEY CATH 14FRSI W/METER (CATHETERS) ×3 IMPLANT
WATER STERILE IRR 1500ML POUR (IV SOLUTION) IMPLANT

## 2012-03-03 NOTE — Progress Notes (Signed)
Utilization review completed.  

## 2012-03-03 NOTE — H&P (View-Only) (Signed)
Consult Note: Gyn-Onc   Brittany Shaffer 66 y.o. female  Chief Complaint  Patient presents with  . Pelvic mass    New pt       HPI 58-year-old white female seen in consultation request of Dr. Eura Mccauslin Gottsegen regarding management of a newly diagnosed pelvic mass.  The mass is asymptomatic and found on routine pelvic exam.  Subsequent ultrasound shows a mass measuring 14 x 10x7 cm which is complex with solid and cystic areas.  There is free fluid noted in the pelvis.  CA125 was 18.1.   The patient has no other gyn history.  She uses a estraderm patch.   Her sister developed ovarian cancer at age 40.  She is still alive at age 73. The patient denies any GU or GI or pelvic symptoms.  She is very active playing tennis several times a week.    Review of Systems:10 point review of systems is negative as noted above.   Vitals: Blood pressure 128/80, pulse 78, temperature 98 F (36.7 C), temperature source Oral, resp. rate 16, height 5' 7.13" (1.705 m), weight 163 lb 14.4 oz (74.345 kg).  Physical Exam: General : The patient is a healthy woman in no acute distress.  HEENT: normocephalic, extraoccular movements normal; neck is supple without thyromegally  Lynphnodes: Supraclavicular and inguinal nodes not enlarged  Abdomen: Soft, non-tender, no ascites, no organomegally, no masses, no hernias  Pelvic:  EGBUS: Normal female  Vagina: Normal, no lesions  Urethra and Bladder: Normal, non-tender  Cervix:  Normal Uterus: Anterior normal shape size and consistency.   Bi-manual examination: There is a firm cystic mass filling the posterior pelvis pushing the uterus anteriorly. This has some mobility.  Rectal: normal sphincter tone, there is no cul-de-sac nodularity Lower extremities: No edema or varicosities. Normal range of motion    Assessment/Plan: Complex large pelvic mass with normal CA 125. The patient's records have been reviewed as well as the ultrasound. There is certainly some concern for  malignancy especially based on the ultrasound characteristics and evidence of free fluid.  Detailed discussion with the patient regarding the differential diagnosis. We would recommend she undergo exploratory laparotomy resection the mass with intraoperative frozen section to guide further therapy. If this is benign we plan on performing a total abdominal hysterectomy and bilateral salpingo-oophorectomy. If it is a malignancy then further surgical staging and/or debulking would be performed at the same operation. The patient is aware of the risks of surgery including hemorrhage, infection, injury to adjacent viscera, and anesthetic risks. All of her questions are answered. She will be seen in preop evaluation today and surgery is scheduled for July 2  Allergies  Allergen Reactions  . Codeine     Hallucinations    Past Medical History  Diagnosis Date  . Arthritis   . Rash     Past Surgical History  Procedure Date  . Tubal ligation   . Eye lid surg     Current Outpatient Prescriptions  Medication Sig Dispense Refill  . ciclopirox (LOPROX) 0.77 % cream Apply 1 application topically 2 (two) times daily.      . diphenhydrAMINE (BENADRYL) 25 MG tablet Take 25 mg by mouth at bedtime as needed. For rash under breast and on stomach      . estradiol (CLIMARA - DOSED IN MG/24 HR) 0.025 mg/24hr Place 1 patch onto the skin once a week. On Saturdays      . etodolac (LODINE) 500 MG tablet Take 500 mg by mouth   daily with breakfast.      . medroxyPROGESTERone (PROVERA) 5 MG tablet Take 1 tablet (5 mg total) by mouth as directed. Days 1-12  12 tablet  12  . zolpidem (AMBIEN) 10 MG tablet Take 10 mg by mouth at bedtime as needed. For sleep        History   Social History  . Marital Status: Married    Spouse Name: N/A    Number of Children: N/A  . Years of Education: N/A   Occupational History  . Not on file.   Social History Main Topics  . Smoking status: Never Smoker   . Smokeless tobacco:  Not on file  . Alcohol Use: 3.5 oz/week    7 drink(s) per week  . Drug Use: Not on file  . Sexually Active: No   Other Topics Concern  . Not on file   Social History Narrative  . No narrative on file    Family History  Problem Relation Age of Onset  . Hypertension Mother   . Bone cancer Father   . Lung cancer Father   . Ovarian cancer Sister   . Diabetes Maternal Aunt   . Breast cancer Maternal Aunt     Age 90's      CLARKE-PEARSON,Shanetra Blumenstock L, MD 02/28/2012, 2:50 PM         

## 2012-03-03 NOTE — Anesthesia Preprocedure Evaluation (Signed)
Anesthesia Evaluation  Patient identified by MRN, date of birth, ID band Patient awake    Reviewed: Allergy & Precautions, H&P , NPO status , Patient's Chart, lab work & pertinent test results, reviewed documented beta blocker date and time   Airway Mallampati: II TM Distance: >3 FB Neck ROM: full    Dental No notable dental hx.    Pulmonary shortness of breath,  breath sounds clear to auscultation  Pulmonary exam normal       Cardiovascular Exercise Tolerance: Good negative cardio ROS  + Valvular Problems/Murmurs Rhythm:regular Rate:Normal     Neuro/Psych negative neurological ROS  negative psych ROS   GI/Hepatic negative GI ROS, Neg liver ROS,   Endo/Other  negative endocrine ROS  Renal/GU negative Renal ROS  negative genitourinary   Musculoskeletal   Abdominal   Peds  Hematology negative hematology ROS (+)   Anesthesia Other Findings   Reproductive/Obstetrics negative OB ROS                           Anesthesia Physical Anesthesia Plan  ASA: II  Anesthesia Plan: General ETT   Post-op Pain Management:    Induction:   Airway Management Planned:   Additional Equipment:   Intra-op Plan:   Post-operative Plan:   Informed Consent: I have reviewed the patients History and Physical, chart, labs and discussed the procedure including the risks, benefits and alternatives for the proposed anesthesia with the patient or authorized representative who has indicated his/her understanding and acceptance.   Dental Advisory Given  Plan Discussed with: CRNA  Anesthesia Plan Comments:         Anesthesia Quick Evaluation

## 2012-03-03 NOTE — Anesthesia Postprocedure Evaluation (Signed)
  Anesthesia Post-op Note  Patient: Brittany Shaffer  Procedure(s) Performed: Procedure(s) (LRB): EXPLORATORY LAPAROTOMY (N/A) HYSTERECTOMY ABDOMINAL (N/A) SALPINGO OOPHERECTOMY (Bilateral)  Patient Location: PACU  Anesthesia Type: General  Level of Consciousness: awake and alert   Airway and Oxygen Therapy: Patient Spontanous Breathing  Post-op Pain: mild  Post-op Assessment: Post-op Vital signs reviewed, Patient's Cardiovascular Status Stable, Respiratory Function Stable, Patent Airway and No signs of Nausea or vomiting  Post-op Vital Signs: stable  Complications: No apparent anesthesia complications

## 2012-03-03 NOTE — Interval H&P Note (Signed)
History and Physical Interval Note:  03/03/2012 11:25 AM  Brittany Shaffer  has presented today for surgery, with the diagnosis of pelvic mass  The various methods of treatment have been discussed with the patient and family. After consideration of risks, benefits and other options for treatment, the patient has consented to  Procedure(s) (LRB): EXPLORATORY LAPAROTOMY (N/A) HYSTERECTOMY ABDOMINAL (N/A) SALPINGO OOPHERECTOMY (Bilateral) LAPAROTOMY WITH STAGING (N/A) as a surgical intervention .  The patient's history has been reviewed, patient examined, no change in status, stable for surgery.  I have reviewed the patients' chart and labs.  Questions were answered to the patient's satisfaction.     CLARKE-PEARSON,Fani Rotondo L

## 2012-03-03 NOTE — Transfer of Care (Signed)
Immediate Anesthesia Transfer of Care Note  Patient: Brittany Shaffer  Procedure(s) Performed: Procedure(s) (LRB): EXPLORATORY LAPAROTOMY (N/A) HYSTERECTOMY ABDOMINAL (N/A) SALPINGO OOPHERECTOMY (Bilateral)  Patient Location: PACU  Anesthesia Type: General  Level of Consciousness: awake, alert , oriented and patient cooperative  Airway & Oxygen Therapy: Patient Spontanous Breathing and Patient connected to face mask oxygen  Post-op Assessment: Report given to PACU RN and Post -op Vital signs reviewed and stable  Post vital signs: Reviewed and stable  Complications: No apparent anesthesia complications

## 2012-03-03 NOTE — Op Note (Signed)
Leighton Parody  female MEDICAL RECORD ZO:[id number] DATE OF BIRTH: September 13, 1945 PHYSICIAN: De Blanch, M.D  DATE OF PROCEDURE: 03/03/2012     OPERATIVE REPORT  PREOPERATIVE DIAGNOSIS: Complex pelvic mass  POSTOPERATIVE DIAGNOSIS: Left ovarian fibroma (frozen section)  PROCEDURE: Total abdominal hysterectomy bilateral salpingo-oophorectomy  SURGEON: De Blanch, M.D ASSISTANT: Antionette Char M.D. Telford Nab RN ANESTHESIA: Gen. with oral tracheal tube ESTIMATED BLOOD LOSS: 50 mL  SURGICAL FINDINGS: At exploratory laparotomy the patient had predominantly solid mass measured approximately 15 cm in diameter arising from the left ovary. On frozen section reversal at this was a fibroadenoma. The right ovary was normal as was the uterus. The appendix appeared normal. Exploration the upper abdomen was normal.    PROCEDURE: Patient brought to the operating room and after satisfactory attainment of general anesthesia was placed in a modified lithotomy position in [place] stirrups. Anterior abdominal wall perineum and vagina were prepped a Foley catheter was inserted the patient was draped. A time out was taken and antibiotics were administered. The abdomen was entered through midline incision. Initially peritoneal washings were obtained for cytology. However when the frozen section report returned as benign the washings were discarded. The upper abdomen and the pelvis were explored with the above-noted findings. A Bookwalter retractor was positioned and the small bowel packed out of the pelvis. The left retroperitoneal space was opened and the ureter and vessels identified. The ovarian vessels were skeletonized clamped cut free tied and suture ligated. The broad ligament beneath the ovarian tumor was incised until we reached the uterus. Uterine cornu was doubly crossclamped and the fallopian tube and ovarian ligament were transected. The left tube and ovary were submitted for  frozen section with the above-noted findings.   Attention was turned to the right side of the pelvis. The right retroperitoneal space was opened, the ovarian vessel skeletonized, clamped, cut free tied and suture ligated. The bladder flap was advanced with sharp and blunt dissection. In a stepwise fashion the uterine vessels were skeletonized clamped cut and suture ligated. The cardinal ligaments and paracervical tissues were clamped cut and suture ligated. The vaginal angles were crossclamped and the vagina transected from its connection to the cervix. The vaginal angles were transfixed with 0 Vicryl. The central portion vagina closed the interrupted sutures of 0 Vicryl. The pelvis is irrigated. Hemostasis achieved with cautery.  The anterior, wall was closed in layers first in a running mass closure using #1 PDS. Subcutaneous tissue was irrigated and hemostasis was achieved with cautery and the skin closed with skin staples. A dressing was applied. Patient was awakened from anesthesia and taken to recovery in satisfactory condition sponge needle and instrument counts correct x2  De Blanch, M.D     And and a

## 2012-03-04 ENCOUNTER — Encounter (HOSPITAL_COMMUNITY): Payer: Self-pay | Admitting: Gynecology

## 2012-03-04 LAB — BASIC METABOLIC PANEL
BUN: 9 mg/dL (ref 6–23)
CO2: 26 mEq/L (ref 19–32)
CO2: 26 mEq/L (ref 19–32)
Calcium: 8.9 mg/dL (ref 8.4–10.5)
Chloride: 98 mEq/L (ref 96–112)
Chloride: 99 mEq/L (ref 96–112)
Creatinine, Ser: 0.67 mg/dL (ref 0.50–1.10)
Creatinine, Ser: 0.69 mg/dL (ref 0.50–1.10)
Glucose, Bld: 147 mg/dL — ABNORMAL HIGH (ref 70–99)
Potassium: 4.5 mEq/L (ref 3.5–5.1)

## 2012-03-04 LAB — CBC
Hemoglobin: 12.1 g/dL (ref 12.0–15.0)
MCH: 33.9 pg (ref 26.0–34.0)
MCHC: 35 g/dL (ref 30.0–36.0)
MCV: 96.9 fL (ref 78.0–100.0)

## 2012-03-04 MED ORDER — CALCIUM CARBONATE ANTACID 500 MG PO CHEW
1.0000 | CHEWABLE_TABLET | Freq: Three times a day (TID) | ORAL | Status: DC | PRN
  Administered 2012-03-04: 400 mg via ORAL
  Filled 2012-03-04 (×2): qty 2

## 2012-03-04 MED ORDER — OXYCODONE-ACETAMINOPHEN 5-325 MG PO TABS
1.0000 | ORAL_TABLET | ORAL | Status: DC | PRN
  Administered 2012-03-04 (×2): 1 via ORAL
  Administered 2012-03-04 – 2012-03-05 (×2): 2 via ORAL
  Filled 2012-03-04: qty 1
  Filled 2012-03-04 (×2): qty 2

## 2012-03-04 MED ORDER — ACETAMINOPHEN 325 MG PO TABS
325.0000 mg | ORAL_TABLET | Freq: Four times a day (QID) | ORAL | Status: DC | PRN
  Administered 2012-03-04: 325 mg via ORAL

## 2012-03-04 MED ORDER — KCL IN DEXTROSE-NACL 20-5-0.9 MEQ/L-%-% IV SOLN
INTRAVENOUS | Status: DC
  Administered 2012-03-04: 11:00:00 via INTRAVENOUS
  Filled 2012-03-04 (×4): qty 1000

## 2012-03-04 MED ORDER — ACETAMINOPHEN 325 MG PO TABS
ORAL_TABLET | ORAL | Status: AC
  Filled 2012-03-04: qty 1

## 2012-03-04 NOTE — Progress Notes (Signed)
1 Day Post-Op Procedure(s) (LRB): EXPLORATORY LAPAROTOMY (N/A) HYSTERECTOMY ABDOMINAL (N/A) SALPINGO OOPHERECTOMY (Bilateral)  Subjective: Patient reports lower back pain, intermittent nausea, tolerating minimum PO intake.  Denies vomiting, flatus, or having a bowel movement.  Reporting minimal incisional pain.   Objective: Vital signs in last 24 hours: Temp:  [97 F (36.1 C)-98.6 F (37 C)] 98.3 F (36.8 C) (07/03 0605) Pulse Rate:  [45-63] 61  (07/03 0605) Resp:  [10-20] 16  (07/03 0605) BP: (118-156)/(60-86) 141/70 mmHg (07/03 0605) SpO2:  [98 %-100 %] 100 % (07/03 0605) Weight:  [170 lb 13.7 oz (77.5 kg)] 170 lb 13.7 oz (77.5 kg) (07/02 1500) Last BM Date: 03/02/12  Intake/Output from previous day: 07/02 0701 - 07/03 0700 In: 4147.9 [P.O.:600; I.V.:3547.9] Out: 2925 [Urine:2825; Blood:100]  Physical Examination: General: alert, cooperative and no distress Resp: clear to auscultation bilaterally Cardio: regular rate and rhythm and mild systolic murmur 2/6 noted GI: soft, non-tender; bowel sounds normal; no masses,  no organomegaly and incision: clean, dry and intact Extremities: extremities normal, atraumatic, no cyanosis or edema  Labs: WBC/Hgb/Hct/Plts:  11.3/12.1/34.6/206 (07/03 0415) BUN/Cr/glu/ALT/AST/amyl/lip:  9/0.67/--/--/--/--/-- (07/03 1610)  Assessment: 66 y.o. s/p Procedure(s): EXPLORATORY LAPAROTOMY HYSTERECTOMY ABDOMINAL SALPINGO OOPHERECTOMY: stable Pain:  Pain is well-controlled on PCA.  GI:  Tolerating po: Yes, minimal intake.  Encouraging PO intake when nausea resolves     FEN: Hyponatremia: 129  Prophylaxis: intermittent pneumatic compression boots.  Plan: Encourage ambulation Kpad for back pain Recheck Bmet at 14:00 to evaluate hyponatremia Change IVF to D5NS with of KCL Encourage PO intake when nausea resolves   LOS: 1 day   CROSS, MELISSA DEAL 03/04/2012, 9:08 AM

## 2012-03-05 LAB — BASIC METABOLIC PANEL
BUN: 9 mg/dL (ref 6–23)
CO2: 28 mEq/L (ref 19–32)
Calcium: 9.3 mg/dL (ref 8.4–10.5)
GFR calc non Af Amer: 84 mL/min — ABNORMAL LOW (ref >90)
Glucose, Bld: 95 mg/dL (ref 70–99)
Potassium: 4 mEq/L (ref 3.5–5.1)
Sodium: 139 mEq/L (ref 135–145)

## 2012-03-05 MED ORDER — OXYCODONE-ACETAMINOPHEN 5-325 MG PO TABS: 1.0000 | ORAL_TABLET | ORAL | Status: AC | PRN

## 2012-03-05 NOTE — Discharge Summary (Signed)
Physician Discharge Summary  Patient ID: Brittany Shaffer MRN: [id number] DOB/AGE: 11/15/1945 66 y.o.  Admit date: 03/03/2012 Discharge date: 03/05/2012  Admission Diagnoses: Pelvic mass in female  Discharge Diagnoses:  Principal Problem:  *Pelvic mass in female  Discharged Condition: good  Hospital Course: On 03/03/2012, the patient underwent the following: Procedure(s): EXPLORATORY LAPAROTOMY HYSTERECTOMY ABDOMINAL SALPINGO OOPHERECTOMY.   The postoperative course was uneventful.  She was discharged to home on postoperative day 2 tolerating a regular diet.  Consults: None  Significant Diagnostic Studies: None  Treatments: surgery: See above  Discharge Exam: Blood pressure 137/78, pulse 60, temperature 98 F (36.7 C), temperature source Oral, resp. rate 18, height 5\' 7"  (1.702 m), weight 170 lb 13.7 oz (77.5 kg), SpO2 95.00%. General appearance: alert, cooperative and no distress Resp: clear to auscultation bilaterally Cardio: regular rate and rhythm and mild systolic murmur noted 2/6 GI: soft, non-tender; bowel sounds normal; no masses,  no organomegaly Extremities: extremities normal, atraumatic, no cyanosis or edema Incision/Wound: Midline abdominal incision with staples clean, dry, and intact  Disposition: Final discharge disposition not confirmed  Discharge Orders    Future Appointments: Provider: Department: Dept Phone: Center:   04/17/2012 11:15 AM Jeannette Corpus, MD [facility] [phone] None     Future Orders Please Complete By Expires   Diet - low sodium heart healthy      Increase activity slowly      Driving Restrictions      Comments:   Do not take narcotics and drive.   Lifting restrictions      Comments:   No lifting greater than 30 lbs.   Sexual Activity Restrictions      Comments:   No sexual activity for 6 weeks.   Call MD for:  temperature >100.4      Call MD for:  persistant nausea and vomiting      Call MD for:  severe uncontrolled  pain      Call MD for:  redness, tenderness, or signs of infection (pain, swelling, redness, odor or green/yellow discharge around incision site)      Call MD for:  difficulty breathing, headache or visual disturbances      Call MD for:  hives      Call MD for:  persistant dizziness or light-headedness      Call MD for:  extreme fatigue        Medication List  As of 03/05/2012  9:04 AM   STOP taking these medications         medroxyPROGESTERone 5 MG tablet         TAKE these medications         ciclopirox 0.77 % cream   Commonly known as: LOPROX   Apply 1 application topically 2 (two) times daily.      diphenhydrAMINE 25 MG tablet   Commonly known as: BENADRYL   Take 25 mg by mouth at bedtime as needed. For rash under breast and on stomach      estradiol 0.025 mg/24hr   Commonly known as: CLIMARA - Dosed in mg/24 hr   Place 1 patch onto the skin once a week. On Saturdays      etodolac 500 MG tablet   Commonly known as: LODINE   Take 500 mg by mouth daily with breakfast.      loperamide 2 MG capsule   Commonly known as: IMODIUM   Take 2 mg by mouth 4 (four) times daily as needed.  oxyCODONE-acetaminophen 5-325 MG per tablet   Commonly known as: PERCOCET   Take 1-2 tablets by mouth every 4 (four) hours as needed (moderate to severe pain (when tolerating fluids)).      zolpidem 10 MG tablet   Commonly known as: AMBIEN   Take 10 mg by mouth at bedtime as needed. For sleep           Follow-up Information    Follow up with Jeannette Corpus, MD on 04/17/2012. (Arrive to the clinic on Wednesday, July 10 in the am for staple removal.  Appt on 04/17/12 at 11:15)    Contact information:   [address] [place] [address] [phone]          Signed: Warner Mccreedy DEAL 03/05/2012, 9:04 AM

## 2012-03-10 ENCOUNTER — Telehealth: Payer: Self-pay | Admitting: Gynecologic Oncology

## 2012-03-10 NOTE — Telephone Encounter (Signed)
Called to speak with patient about post-operative status.  Message left.  Instructed to call for any questions or concerns.

## 2012-03-11 ENCOUNTER — Encounter: Payer: Self-pay | Admitting: Gynecologic Oncology

## 2012-03-11 NOTE — Progress Notes (Signed)
Pt arrived to the office today for staple removal.  13 staples removed from midline incision with no difficulty.  Steri strips applied.  No redness or drainage noted.  Pt continuing to report adequate PO intake and bowel/bladder habits.  No questions or concerns voiced.  Pt to return to the office on April 24, 2012 at 11:15am.

## 2012-04-10 ENCOUNTER — Other Ambulatory Visit: Payer: Self-pay | Admitting: Women's Health

## 2012-04-10 ENCOUNTER — Telehealth: Payer: Self-pay | Admitting: *Deleted

## 2012-04-10 ENCOUNTER — Other Ambulatory Visit: Payer: Medicare Other

## 2012-04-10 DIAGNOSIS — N390 Urinary tract infection, site not specified: Secondary | ICD-10-CM

## 2012-04-10 DIAGNOSIS — R350 Frequency of micturition: Secondary | ICD-10-CM

## 2012-04-10 MED ORDER — CIPROFLOXACIN HCL 500 MG PO TABS: 500.0000 mg | ORAL_TABLET | Freq: Two times a day (BID) | ORAL | Status: AC

## 2012-04-10 NOTE — Telephone Encounter (Signed)
Please have her come to office ASAP for UA with culture, encourage her to avoid caffeinated beverages. Please have UA come to my in box and I will check  the UA.

## 2012-04-10 NOTE — Telephone Encounter (Signed)
Pt informed and will come now,order placed.

## 2012-04-10 NOTE — Progress Notes (Signed)
Telephone call, pt. dropped off a urine for a UA today.Was having no pain or burning but was having incontinence with leakage of urine with walking and bending. Had abdominal hysterectomy with a 14 cm mass removed 03/03/12 by Dr. Argentina Ponder and has done well. Symptoms have been for 2 days prior to that was able to hold her urine with no symptoms or bleeding. Denies a fever. UA is pending, was sent to [facility] instead of being done at the office. Due to patient's symptoms will treat with Cipro 500 twice a day for 3 days. Reviewed if symptoms persist after antibiotic will need further evaluation.

## 2012-04-10 NOTE — Telephone Encounter (Signed)
Dr.G patient calling c/o wetting her pants while sitting, bending ,walking. She had hysterectomy on 03/03/12 that Dr. Loree Fee she is not c/o any pain, just incontinence, no burning, she does have frequency. Pt would like to leave u/a to check for infection. OV offered to pt and she asked if u/a could be dropped off? Please advise

## 2012-04-11 LAB — URINALYSIS W MICROSCOPIC + REFLEX CULTURE
Glucose, UA: NEGATIVE mg/dL
Specific Gravity, Urine: 1.023 (ref 1.005–1.030)
Squamous Epithelial / LPF: NONE SEEN
Urobilinogen, UA: 0.2 mg/dL (ref 0.0–1.0)

## 2012-04-13 ENCOUNTER — Telehealth: Payer: Self-pay | Admitting: *Deleted

## 2012-04-13 NOTE — Telephone Encounter (Signed)
Tc to pt with Dr Nelwyn Salisbury advice to come in on Friday to evaluate urinary incontinence. Will come in 04/17/12 @ 1500

## 2012-04-14 ENCOUNTER — Ambulatory Visit (INDEPENDENT_AMBULATORY_CARE_PROVIDER_SITE_OTHER): Payer: Medicare Other | Admitting: Obstetrics and Gynecology

## 2012-04-14 DIAGNOSIS — N898 Other specified noninflammatory disorders of vagina: Secondary | ICD-10-CM

## 2012-04-14 LAB — WET PREP FOR TRICH, YEAST, CLUE

## 2012-04-14 NOTE — Progress Notes (Signed)
Patient came to see me today with profuse vaginal discharge which is clear without odor or itching. She had a TAH, BSO by Dr. 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 She is scheduled to see him later this week for followup. She called [place] last week and she thought at that time she was having urinary incontinence and was treated by my nurse practitioner with Cipro without response. She now does not think it is urine. It is however profuse and frequent.  Exam: Kennon Portela present. Abdomen is soft without guarding. Incision healing well. Pelvic: External within normal limits. BUS within normal limits. Vaginal examination does show a heavy watery discharge. No evidence of a fistula seen. Wet prep negative for yeast, amine, trich, or clue cells. There are many white blood cells and moderate bacteria present. Cuff was closed and healing well. Bimanual exam is negative.  Assessment: Profuse vaginal discharge  Plan: It is not really appear to either be urine from a fistula or vaginitis. I suspect is peritoneal fluid leaking through the cuff which is not completely sealed. For the moment we did not treat her. She will see Dr. Argentina Ponder on Friday and get his opinion.

## 2012-04-14 NOTE — Patient Instructions (Signed)
Keep appointment with Dr. Argentina Ponder on Friday.

## 2012-04-15 ENCOUNTER — Telehealth: Payer: Self-pay | Admitting: Gynecologic Oncology

## 2012-04-15 NOTE — Telephone Encounter (Signed)
Message left for patient stating that she needs to keep her appointment with Dr. Stanford Breed on Friday.  Instructed to call the office for any questions or concerns.

## 2012-04-17 ENCOUNTER — Encounter: Payer: Self-pay | Admitting: Gynecology

## 2012-04-17 ENCOUNTER — Ambulatory Visit: Payer: Medicare Other | Attending: Gynecology | Admitting: Gynecology

## 2012-04-17 ENCOUNTER — Ambulatory Visit: Payer: Medicare Other | Admitting: Gynecology

## 2012-04-17 VITALS — BP 152/70 | HR 70 | Temp 97.6°F | Resp 16 | Ht 67.13 in | Wt 158.6 lb

## 2012-04-17 DIAGNOSIS — N898 Other specified noninflammatory disorders of vagina: Secondary | ICD-10-CM | POA: Insufficient documentation

## 2012-04-17 DIAGNOSIS — D279 Benign neoplasm of unspecified ovary: Secondary | ICD-10-CM

## 2012-04-17 DIAGNOSIS — Z8041 Family history of malignant neoplasm of ovary: Secondary | ICD-10-CM | POA: Insufficient documentation

## 2012-04-17 DIAGNOSIS — Z90710 Acquired absence of both cervix and uterus: Secondary | ICD-10-CM | POA: Insufficient documentation

## 2012-04-17 DIAGNOSIS — Z9079 Acquired absence of other genital organ(s): Secondary | ICD-10-CM | POA: Insufficient documentation

## 2012-04-17 DIAGNOSIS — Z79899 Other long term (current) drug therapy: Secondary | ICD-10-CM | POA: Insufficient documentation

## 2012-04-17 DIAGNOSIS — Z09 Encounter for follow-up examination after completed treatment for conditions other than malignant neoplasm: Secondary | ICD-10-CM | POA: Insufficient documentation

## 2012-04-17 DIAGNOSIS — Z803 Family history of malignant neoplasm of breast: Secondary | ICD-10-CM | POA: Insufficient documentation

## 2012-04-17 DIAGNOSIS — R1900 Intra-abdominal and pelvic swelling, mass and lump, unspecified site: Secondary | ICD-10-CM

## 2012-04-17 NOTE — Progress Notes (Signed)
Consult Note: Gyn-Onc   Brittany Shaffer 66 y.o. female  Chief Complaint  Patient presents with  . Pelvic Mass    Follow up post-op    Interval History: The patient underwent a total abdominal hysterectomy and bilateral salpingo-oophorectomy on 03/21/2012 for a large pelvic mass. 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 Her initial postoperative course was uncomplicated. However approximately a week ago the patient began to have a profuse clear vaginal discharge which did not have any odor to it. She notes this is particularly problem when she is upright and walking and does not drain when she is lying in bed. She changes 4 pads a day. She has seen Dr Eda Paschal on August 13. Review of his records showed no clear evidence as to the source of the drainage. She denies any fever or chills. She says her bladder function is good and she does not feel like she is having urinary incontinence.  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  HPI 66 year old white female seen in consultation request of Dr. Edyth Gunnels regarding management of a newly diagnosed pelvic mass. The mass is asymptomatic and found on routine pelvic exam. Subsequent ultrasound shows a mass measuring 14 x 10x7 cm which is complex with solid and cystic areas. There is free fluid noted in the pelvis. CA125 was 18.1.  The patient has no other gyn history. She uses a estraderm patch.    Review of Systems:10 point review of systems is negative as noted above.   Vitals: Blood pressure 152/70, pulse 70, temperature 97.6 F (36.4 C), temperature source Oral, resp. rate 16, height 5' 7.13" (1.705 m), weight 158 lb 9.6 oz (71.94 kg).  Physical Exam: General : The patient is a healthy woman in no acute distress.  HEENT: normocephalic, extraoccular movements normal; neck is supple without thyromegally  Lynphnodes: Supraclavicular and inguinal nodes not enlarged    Abdomen: Soft, non-tender, no ascites, no organomegally, no masses, no hernias  Pelvic:  EGBUS: Normal female  Vagina: Normal, no lesions careful inspection the vaginal cuff shows no evidence of any disruption of the suture line. In the supine position there is no drainage into the vagina. However after I had the patient stand for a few minutes and then reexamined her there is clear fluid in the vagina. Palpation the vaginal cuff reveals postoperative induration which is appropriate.  Urethra and Bladder: Normal, non-tender  Cervix: Surgically absent  Uterus: Surgically absent  Bi-manual examination: Non-tender; no adenxal masses or nodularity  Rectal: normal sphincter tone, no masses, no blood  Lower extremities: No edema or varicosities. Normal range of motion    Assessment/Plan: Vaginal drainage of uncertain etiology. Differential diagnosis includes peritoneal fluid draining through the vaginal cuff versus a urinary tract fistula.  To further evaluate this over the weekend the patient is given a prescription for Pyridium 200 mg. We will ask her to place a tampon and then take a Pyridium. Once she is voiding yellow urine (from the pyridium) she will remove the tampon and inspect the end away from the string to see whether there is the yellow on the tampon. She will call on Monday with the results. In the interval, presuming that this is disruption of the upper vagina, we'll place the patient on Premarin cream 1/2 g at bedtime every night. Even if we need to revise the vaginal cuff surgically, use of Premarin cream should improve the  chances of cuff healing.  Allergies  Allergen Reactions  . Codeine     Hallucinations    Past Medical History  Diagnosis Date  . Arthritis   . Rash     ACROSS ABD  . Heart murmur     MVP  . Shortness of breath     WITH EXERTION  . Leg cramps     Past Surgical History  Procedure Date  . Tubal ligation   . Eye lid surg   . Laparotomy 03/03/2012     Procedure: EXPLORATORY LAPAROTOMY;  Surgeon: Jeannette Corpus, MD;  Location: [facility];  Service: Gynecology;  Laterality: N/A;  . Abdominal hysterectomy 03/03/2012    Procedure: HYSTERECTOMY ABDOMINAL;  Surgeon: Jeannette Corpus, MD;  Location: [facility];  Service: Gynecology;  Laterality: N/A;  . Salpingoophorectomy 03/03/2012    Procedure: SALPINGO OOPHERECTOMY;  Surgeon: Jeannette Corpus, MD;  Location: [facility];  Service: Gynecology;  Laterality: Bilateral;    Current Outpatient Prescriptions  Medication Sig Dispense Refill  . estradiol (CLIMARA - DOSED IN MG/24 HR) 0.025 mg/24hr Place 1 patch onto the skin once a week. On Saturdays      . etodolac (LODINE) 500 MG tablet Take 500 mg by mouth daily with breakfast.      . zolpidem (AMBIEN) 10 MG tablet Take 10 mg by mouth at bedtime as needed. For sleep      . ciclopirox (LOPROX) 0.77 % cream Apply 1 application topically 2 (two) times daily.      . ciprofloxacin (CIPRO) 500 MG tablet Take 1 tablet (500 mg total) by mouth 2 (two) times daily.  6 tablet  0  . diphenhydrAMINE (BENADRYL) 25 MG tablet Take 25 mg by mouth at bedtime as needed. For rash under breast and on stomach      . loperamide (IMODIUM) 2 MG capsule Take 2 mg by mouth 4 (four) times daily as needed.        History   Social History  . Marital Status: Married    Spouse Name: N/A    Number of Children: N/A  . Years of Education: N/A   Occupational History  . Not on file.   Social History Main Topics  . Smoking status: Never Smoker   . Smokeless tobacco: Not on file  . Alcohol Use: 3.5 oz/week    7 drink(s) per week  . Drug Use: No  . Sexually Active: No   Other Topics Concern  . Not on file   Social History Narrative  . No narrative on file    Family History  Problem Relation Age of Onset  . Hypertension Mother   . Bone cancer Father   . Lung cancer Father   . Ovarian cancer Sister   . Diabetes Maternal Aunt   . Breast cancer Maternal Aunt      Age 54's      Jeannette Corpus, MD 04/17/2012, 4:10 PM

## 2012-04-17 NOTE — Patient Instructions (Signed)
Again using Premarin cream 1/2 g every night beginning tonight.  Follow through with the use of Pyridium and a vaginal tampon on Monday morning and please call us with the results of that test.

## 2012-04-23 ENCOUNTER — Other Ambulatory Visit: Payer: Self-pay | Admitting: *Deleted

## 2012-04-23 DIAGNOSIS — R32 Unspecified urinary incontinence: Secondary | ICD-10-CM

## 2012-04-24 ENCOUNTER — Ambulatory Visit: Payer: Medicare Other | Attending: Gynecology | Admitting: Gynecology

## 2012-04-24 ENCOUNTER — Encounter: Payer: Self-pay | Admitting: Gynecology

## 2012-04-24 VITALS — BP 138/82 | HR 76 | Temp 98.4°F | Resp 20 | Ht 67.13 in | Wt 156.4 lb

## 2012-04-24 DIAGNOSIS — R1909 Other intra-abdominal and pelvic swelling, mass and lump: Secondary | ICD-10-CM | POA: Insufficient documentation

## 2012-04-24 DIAGNOSIS — R1900 Intra-abdominal and pelvic swelling, mass and lump, unspecified site: Secondary | ICD-10-CM

## 2012-04-24 DIAGNOSIS — Z9079 Acquired absence of other genital organ(s): Secondary | ICD-10-CM | POA: Insufficient documentation

## 2012-04-24 DIAGNOSIS — Z79899 Other long term (current) drug therapy: Secondary | ICD-10-CM | POA: Insufficient documentation

## 2012-04-24 DIAGNOSIS — Z90710 Acquired absence of both cervix and uterus: Secondary | ICD-10-CM | POA: Insufficient documentation

## 2012-04-24 NOTE — Progress Notes (Signed)
Consult Note: Gyn-Onc   Brittany Shaffer 66 y.o. female  Chief Complaint  Patient presents with  . Pelvic mass    Follow up     Following last week's visit, we had her take pyridium and insert a tampon. With the possibility that this was peritoneal fluid leaking from the vaginal cuff, I placed her on premarin cream on half gram q hs.   On Monday she told us that the tampon had yellow dye on the proximal end.  We scheduled a cystogram to evaluate for possible VV fistula.  However, 3 days ago all of the drainage stopped!! She comes today to have another tampon test.  She feels well.  HPI 66 year old white female seen in consultation request of Dr. Edyth Gunnels regarding management of a newly diagnosed pelvic mass. The mass is asymptomatic and found on routine pelvic exam. Subsequent ultrasound shows a mass measuring 14 x 10x7 cm which is complex with solid and cystic areas. There is free fluid noted in the pelvis. CA125 was 18.1.  The patient has no other gyn history. She uses a estraderm patch.   The patient underwent a total abdominal hysterectomy and bilateral salpingo-oophorectomy on 03/21/2012 for a large pelvic mass. 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 Her initial postoperative course was uncomplicated. However approximately four weeks postop she the patient to have a profuse clear vaginal discharge which did not have any odor to it. She noteed this is particularly problem when she is upright and walking and does not drain when she is lying in bed. She changes 4 pads a day. She saw Dr Eda Paschal on August 13. Review of his records showed no clear evidence as to the source of the drainage. She denied any fever or chills. She says her bladder function is good and she does not feel like she is having urinary incontinence.        Review of Systems:10 point review of systems is negative as noted above.   Vitals: Blood pressure 138/82, pulse 76, temperature 98.4 F (36.9 C),  temperature source Oral, resp. rate 20, height 5' 7.13" (1.705 m), weight 156 lb 6.4 oz (70.943 kg).  Physical Exam: General : The patient is a healthy woman in no acute distress.  HEENT: normocephalic, extraoccular movements normal; neck is supple without thyromegally  Lynphnodes: Supraclavicular and inguinal nodes not enlarged  Abdomen: Soft, non-tender, no ascites, no organomegally, no masses, no hernias  Pelvic:  EGBUS: Normal female  Vagina: Normal, no lesions  Urethra and Bladder: Normal, non-tender    Lower extremities: No edema or varicosities. Normal range of motion   The tampon is removed and there is no yellow dye at the proximal end.   Assessment/Plan: I must presume that the drainage the patient's had over the past 2 weeks was secondary to leaking of peritoneal fluid from the vaginal apex. Further, the resolution of the drainage is secondary to closure of what must have been a small hole the vaginal cuff. The patient is reassured regarding these findings and will return to full levels of activity although I will ask her not to have any vaginal intercourse for the next 4 weeks. She will continue to use Premarin cream 1/2 g 3 times a week. She will contact us if she has any further drainage. Otherwise she will return to the care of Dr.Gottsegen  Allergies  Allergen Reactions  . Codeine     Hallucinations    Past Medical History  Diagnosis Date  .  Arthritis   . Rash     ACROSS ABD  . Heart murmur     MVP  . Shortness of breath     WITH EXERTION  . Leg cramps     Past Surgical History  Procedure Date  . Tubal ligation   . Eye lid surg   . Laparotomy 03/03/2012    Procedure: EXPLORATORY LAPAROTOMY;  Surgeon: Jeannette Corpus, MD;  Location: [facility];  Service: Gynecology;  Laterality: N/A;  . Abdominal hysterectomy 03/03/2012    Procedure: HYSTERECTOMY ABDOMINAL;  Surgeon: Jeannette Corpus, MD;  Location: [facility];  Service: Gynecology;  Laterality: N/A;  .  Salpingoophorectomy 03/03/2012    Procedure: SALPINGO OOPHERECTOMY;  Surgeon: Jeannette Corpus, MD;  Location: [facility];  Service: Gynecology;  Laterality: Bilateral;    Current Outpatient Prescriptions  Medication Sig Dispense Refill  . estradiol (CLIMARA - DOSED IN MG/24 HR) 0.025 mg/24hr Place 1 patch onto the skin once a week. On Saturdays      . etodolac (LODINE) 500 MG tablet Take 500 mg by mouth daily with breakfast.      . zolpidem (AMBIEN) 10 MG tablet Take 10 mg by mouth at bedtime as needed. For sleep      . loperamide (IMODIUM) 2 MG capsule Take 2 mg by mouth 4 (four) times daily as needed.        History   Social History  . Marital Status: Married    Spouse Name: N/A    Number of Children: N/A  . Years of Education: N/A   Occupational History  . Not on file.   Social History Main Topics  . Smoking status: Never Smoker   . Smokeless tobacco: Not on file  . Alcohol Use: 3.5 oz/week    7 drink(s) per week  . Drug Use: No  . Sexually Active: No   Other Topics Concern  . Not on file   Social History Narrative  . No narrative on file    Family History  Problem Relation Age of Onset  . Hypertension Mother   . Bone cancer Father   . Lung cancer Father   . Ovarian cancer Sister   . Diabetes Maternal Aunt   . Breast cancer Maternal Aunt     Age 74's      Jeannette Corpus, MD 04/24/2012, 11:36 AM

## 2012-04-24 NOTE — Patient Instructions (Signed)
Please continue to use Premarin vaginal cream 1/2 g 3 times a week applied at bedtime  Please contact us if she had any further drainage. Otherwise we will release you to the care of Dr. Eda Paschal.

## 2012-09-25 ENCOUNTER — Encounter: Payer: Self-pay | Admitting: *Deleted

## 2012-09-25 NOTE — Progress Notes (Signed)
Patient ID: Brittany Shaffer, female   DOB: 02/10/1946, 67 y.o.   MRN: [id number] Pt climara 0.025 mg patch has been approved by [facility] for level Tier 2. Insurance company faxed conformation letter and sent pt letter as well.

## 2013-02-04 ENCOUNTER — Encounter: Payer: Self-pay | Admitting: Gynecology

## 2013-02-04 ENCOUNTER — Ambulatory Visit (INDEPENDENT_AMBULATORY_CARE_PROVIDER_SITE_OTHER): Payer: Medicare Other | Admitting: Gynecology

## 2013-02-04 VITALS — BP 122/78 | Ht 66.75 in | Wt 141.0 lb

## 2013-02-04 DIAGNOSIS — G4700 Insomnia, unspecified: Secondary | ICD-10-CM

## 2013-02-04 DIAGNOSIS — N952 Postmenopausal atrophic vaginitis: Secondary | ICD-10-CM

## 2013-02-04 DIAGNOSIS — Z780 Asymptomatic menopausal state: Secondary | ICD-10-CM

## 2013-02-04 DIAGNOSIS — Z79890 Hormone replacement therapy (postmenopausal): Secondary | ICD-10-CM

## 2013-02-04 MED ORDER — ZOLPIDEM TARTRATE 10 MG PO TABS: 10.0000 mg | ORAL_TABLET | Freq: Every evening | ORAL | Status: DC | PRN

## 2013-02-04 MED ORDER — ESTRADIOL 0.025 MG/24HR TD PTWK: 1.0000 | MEDICATED_PATCH | TRANSDERMAL | Status: DC

## 2013-02-04 NOTE — Progress Notes (Addendum)
Brittany Shaffer 04-10-1946 [id number]        67 y.o.  [id number] for followup exam.  Former patient of Dr. Verl Dicker  Past medical history,surgical history, medications, allergies, family history and social history were all reviewed and documented in the EPIC chart.  ROS:  Performed and pertinent positives and negatives are included in the history, assessment and plan .  Exam: Biomedical scientist Filed Vitals:   02/04/13 0908  BP: 122/78  Height: 5' 6.75" (1.695 m)  Weight: 141 lb (63.957 kg)   General appearance  Normal Skin grossly normal Head/Neck normal with no cervical or supraclavicular adenopathy thyroid normal Lungs  clear Cardiac RR, without RMG Abdominal  soft, nontender, without masses, organomegaly or hernia Breasts  examined lying and sitting without masses, retractions, discharge or axillary adenopathy. Pelvic  Ext/BUS/vagina  atrophic changes  Adnexa  Without masses or tenderness    Anus and perineum  normal   Rectovaginal  normal sphincter tone without palpated masses or tenderness.    Assessment/Plan:  67 y.o. [id number] female for followup exam, status post TAH/BSO last year by Dr. De Blanch for benign fibrothecoma..   1. HRT. Patient on estradiol 0.025 patch.She tried stopping but had return of hot flashes sweats and just did not feel well. I reviewed the whole issue of HRT with her to include the WHI study with increased risk of stroke heart attack DVT and breast cancer. The ACOG and NAMS statements for lowest dose for the shortest period of time discussed. Transdermal benefit versus oral/first pass effect benefit discussed.  After a lengthy discussion the patient wants to continue and I refilled her patch x1 year. 2. Insomnia. Patient has occasional insomnia and uses Ambien 10 mg intermittently. I refilled her Ambien 10 mg #30 with 3 refills. 3. Atrophic vaginitis. Patient is asymptomatic without vaginal dryness irritation dyspareunia. Will continue to  monitor 4. Pap smear 2011. No Pap smear done today. No history of abnormal Pap smears. Reviewed current screening guidelines. She is status post hysterectomy for benign indications and over the age of 41. We'll plan to stop screening and she is comfortable with this. 5. Mammography 2013. Patient knows to schedule this year. SBE monthly reviewed. 6. Colonoscopy 2011. Repeat at their recommended interval. 7. DEXA 2010 normal. Plan repeat now. Increase calcium vitamin D reviewed. 8. Health maintenance. The blood work done as it is all done through her primary physician's office who she sees on a regular basis. Followup 1 year, sooner as needed.    Dara Lords MD, 9:51 AM 02/04/2013

## 2013-02-04 NOTE — Patient Instructions (Signed)

## 2013-02-05 LAB — URINALYSIS W MICROSCOPIC + REFLEX CULTURE
Crystals: NONE SEEN
Glucose, UA: NEGATIVE mg/dL
Hgb urine dipstick: NEGATIVE
Leukocytes, UA: NEGATIVE
Nitrite: NEGATIVE
Specific Gravity, Urine: 1.018 (ref 1.005–1.030)
pH: 5 (ref 5.0–8.0)

## 2013-02-06 LAB — URINE CULTURE
Colony Count: NO GROWTH
Organism ID, Bacteria: NO GROWTH

## 2013-02-16 ENCOUNTER — Telehealth: Payer: Self-pay

## 2013-02-16 ENCOUNTER — Other Ambulatory Visit: Payer: Self-pay | Admitting: Gynecology

## 2013-02-16 DIAGNOSIS — R822 Biliuria: Secondary | ICD-10-CM

## 2013-02-16 NOTE — Telephone Encounter (Signed)
Patient informed. She will have labs drawn in the am when she comes for Dexa Scan.  She said to tell you she is on Lodine for her arthritis and has been for along time. She did not know if perhaps that could be affecting her liver and causing this.  I told her I would mention it to you.

## 2013-02-16 NOTE — Telephone Encounter (Signed)
Message copied by Keenan Bachelor on Tue Feb 16, 2013  4:10 PM ------      Message from: Dara Lords      Created: Tue Feb 16, 2013 12:32 PM       Thanks for taking that. I want her to do a liver panel to include bilirubin and liver function tests. ------

## 2013-02-17 ENCOUNTER — Other Ambulatory Visit: Payer: Medicare Other

## 2013-02-18 ENCOUNTER — Other Ambulatory Visit: Payer: Medicare Other

## 2013-02-18 ENCOUNTER — Ambulatory Visit (INDEPENDENT_AMBULATORY_CARE_PROVIDER_SITE_OTHER): Payer: Medicare Other

## 2013-02-18 DIAGNOSIS — R822 Biliuria: Secondary | ICD-10-CM

## 2013-02-18 DIAGNOSIS — Z780 Asymptomatic menopausal state: Secondary | ICD-10-CM

## 2013-02-18 LAB — HEPATIC FUNCTION PANEL
AST: 17 U/L (ref 0–37)
Alkaline Phosphatase: 65 U/L (ref 39–117)
Bilirubin, Direct: 0.1 mg/dL (ref 0.0–0.3)
Total Bilirubin: 0.7 mg/dL (ref 0.3–1.2)

## 2013-02-28 ENCOUNTER — Other Ambulatory Visit: Payer: Self-pay | Admitting: Obstetrics and Gynecology

## 2013-03-12 ENCOUNTER — Encounter (HOSPITAL_COMMUNITY): Payer: Self-pay | Admitting: Pharmacy Technician

## 2013-03-12 NOTE — Progress Notes (Signed)
Surgery scheduled for 03/24/13.  Need orders in EPIC.  Thank You.   

## 2013-03-14 ENCOUNTER — Other Ambulatory Visit: Payer: Self-pay | Admitting: Orthopedic Surgery

## 2013-03-14 MED ORDER — BUPIVACAINE LIPOSOME 1.3 % IJ SUSP: 20.0000 mL | Freq: Once | INTRAMUSCULAR | Status: DC

## 2013-03-14 MED ORDER — DEXAMETHASONE SODIUM PHOSPHATE 10 MG/ML IJ SOLN: 10.0000 mg | Freq: Once | INTRAMUSCULAR | Status: DC

## 2013-03-14 NOTE — Progress Notes (Signed)
Preoperative surgical orders have been place into the Epic hospital system for Brittany Shaffer on 03/14/2013, 11:00 PM  by Patrica Duel for surgery on 03/24/2013.  Preop Total Hip - Anterior Approach orders including Experel Injecion, PO Tylenol, and IV Decadron as long as there are no contraindications to the above medications. Avel Peace, PA-C

## 2013-03-16 ENCOUNTER — Ambulatory Visit (HOSPITAL_COMMUNITY)
Admission: RE | Admit: 2013-03-16 | Discharge: 2013-03-16 | Disposition: A | Discharge status: Home / Self Care | Payer: Medicare Other | Source: Ambulatory Visit | Attending: Orthopedic Surgery | Admitting: Orthopedic Surgery

## 2013-03-16 ENCOUNTER — Encounter (HOSPITAL_COMMUNITY)
Admission: RE | Admit: 2013-03-16 | Discharge: 2013-03-16 | Disposition: A | Discharge status: Home / Self Care | Payer: Medicare Other | Source: Ambulatory Visit | Attending: Orthopedic Surgery | Admitting: Orthopedic Surgery

## 2013-03-16 ENCOUNTER — Encounter (HOSPITAL_COMMUNITY): Payer: Self-pay

## 2013-03-16 DIAGNOSIS — M51379 Other intervertebral disc degeneration, lumbosacral region without mention of lumbar back pain or lower extremity pain: Secondary | ICD-10-CM | POA: Insufficient documentation

## 2013-03-16 DIAGNOSIS — M169 Osteoarthritis of hip, unspecified: Secondary | ICD-10-CM | POA: Insufficient documentation

## 2013-03-16 DIAGNOSIS — Z01810 Encounter for preprocedural cardiovascular examination: Secondary | ICD-10-CM | POA: Insufficient documentation

## 2013-03-16 DIAGNOSIS — M5137 Other intervertebral disc degeneration, lumbosacral region: Secondary | ICD-10-CM | POA: Insufficient documentation

## 2013-03-16 DIAGNOSIS — M1610 Unilateral primary osteoarthritis, unspecified hip: Secondary | ICD-10-CM | POA: Insufficient documentation

## 2013-03-16 DIAGNOSIS — Z01818 Encounter for other preprocedural examination: Secondary | ICD-10-CM | POA: Insufficient documentation

## 2013-03-16 LAB — COMPREHENSIVE METABOLIC PANEL
ALT: 10 U/L (ref 0–35)
AST: 17 U/L (ref 0–37)
Albumin: 3.9 g/dL (ref 3.5–5.2)
Alkaline Phosphatase: 71 U/L (ref 39–117)
Calcium: 9.4 mg/dL (ref 8.4–10.5)
Glucose, Bld: 95 mg/dL (ref 70–99)
Potassium: 3.6 mEq/L (ref 3.5–5.1)
Sodium: 138 mEq/L (ref 135–145)
Total Protein: 7.2 g/dL (ref 6.0–8.3)

## 2013-03-16 LAB — CBC
Hemoglobin: 13 g/dL (ref 12.0–15.0)
MCHC: 33.3 g/dL (ref 30.0–36.0)
Platelets: 251 10*3/uL (ref 150–400)

## 2013-03-16 LAB — APTT: aPTT: 30 seconds (ref 24–37)

## 2013-03-16 LAB — URINALYSIS, ROUTINE W REFLEX MICROSCOPIC
Nitrite: NEGATIVE
Protein, ur: NEGATIVE mg/dL
Specific Gravity, Urine: 1.016 (ref 1.005–1.030)
Urobilinogen, UA: 0.2 mg/dL (ref 0.0–1.0)

## 2013-03-16 LAB — SURGICAL PCR SCREEN
MRSA, PCR: NEGATIVE
Staphylococcus aureus: NEGATIVE

## 2013-03-16 NOTE — Patient Instructions (Addendum)
MACRINA LEHNERT  03/16/2013                           YOUR PROCEDURE IS SCHEDULED ON: 03/24/13               PLEASE REPORT TO SHORT STAY CENTER AT : 7:15 AM               CALL THIS NUMBER IF ANY PROBLEMS THE DAY OF SURGERY :               [phone]                      REMEMBER:   Do not eat food or drink liquids AFTER MIDNIGHT .  Take these medicines the morning of surgery with A SIP OF WATER: NONE   Do not wear jewelry, make-up   Do not wear lotions, powders, or perfumes.   Do not shave legs or underarms 12 hrs. before surgery (men may shave face)  Do not bring valuables to the hospital.  Contacts, dentures or bridgework may not be worn into surgery.  Leave suitcase in the car. After surgery it may be brought to your room.  For patients admitted to the hospital more than one night, checkout time is 11:00                          The day of discharge.   Patients discharged the day of surgery will not be allowed to drive home                             If going home same day of surgery, must have someone stay with you first                           24 hrs at home and arrange for some one to drive you home from hospital.    Special Instructions:   Please read over the following fact sheets that you were given:               1. MRSA  INFORMATION                      2.  PREPARING FOR SURGERY SHEET               3. INCENTIVE SPIROMETER                                                X_____________________________________________________________________        Failure to follow these instructions may result in cancellation of your surgery

## 2013-03-23 ENCOUNTER — Other Ambulatory Visit: Payer: Self-pay | Admitting: Orthopedic Surgery

## 2013-03-23 NOTE — H&P (Signed)
Brittany Shaffer. Amezcua  DOB: 02/16/1946 Married / Language: English / Race: White Female  Date of Admission:  03/24/2013  Chief Complaint:  Left hip pain  History of Present Illness The patient is a 67 year old female who comes in for a preoperative History and Physical. The patient is scheduled for a left total hip arthroplasty (anterior approach) to be performed by Dr. Gus Rankin. Aluisio, MD at [facility] on 03/24/2013. The patient is a 67 year old female who presents for follow up of their hip. The patient is being followed for their left hip pain and osteoarthritis. They are out from intra-articular injection. Symptoms reported today include: pain and locking (right low back area). Current treatment includes: NSAIDs. The following medication has been used for pain control: antiinflammatory medication. Note for "Follow-up Hip": Injection did not help as well as the first injection did last year. Pain in groin down to the knee. Voltaren gel did help. Unfortunately her hip pain and dysfunction is getting worse. She is unable to do things she desires. She likes to play tennis and if she tries to play then she will have to stop playing for several days after. She is having some discomfort at night. It does wake her up at night at times. She is having more limited range of motion. She had an intra-articular injection recently which did only help some. She is now ready to proceed with surgery. They have been treated conservatively in the past for the above stated problem and despite conservative measures, they continue to have progressive pain and severe functional limitations and dysfunction. They have failed non-operative management including home exercise, medications, and injections. It is felt that they would benefit from undergoing total joint replacement. Risks and benefits of the procedure have been discussed with the patient and they elect to proceed with surgery. There are no active  contraindications to surgery such as ongoing infection or rapidly progressive neurological disease.   Problem List Osteoarthritis, Hip (715.35)   Allergies Codeine/Codeine Derivatives. intolerance   Family History Hypertension. mother Cancer. mother, father and sister   Social History Marital status. married Number of flights of stairs before winded. greater than 5 Living situation. live with spouse Exercise. Exercises daily; does running / walking and team sport Illicit drug use. no Pain Contract. no Tobacco use. never smoker Previously in rehab. no Tobacco / smoke exposure. no Drug/Alcohol Rehab (Currently). no Children. 2 Current work status. retired Alcohol use. current drinker; drinks beer, wine and hard liquor; 5-7 per week Advance Directives. Living Will, Healthcare POA Post-Surgical Plans. Plan is to go home.   Medication History Estradiol (0.025MG /24HR Patch Weekly, Transdermal) Active. Etodolac (500MG  Tablet ER, Oral) Active. Ambien ( Oral) Specific dose unknown - Active. (1/2 tab prn) Voltaren (1% Gel, Transdermal) Active. (prn when she plays tennis)  Past Surgical History Tubal Ligation Hysterectomy (not due to cancer) - Complete. Post-op complications. incontinence, which has resolved   Medical History Chronic Pain Heart murmur Hypercholesterolemia Osteoarthritis Degeneration, lumbar/lumbosacral disc (722.52). 06/30/1997 Spondylosis, lumbosacral (721.3). 06/30/1997  Review of Systems General:Present- Weight Loss > 10lbs. (She reports weight loss in the past year after her hysterectomy.). Not Present- Chills, Fever, Night Sweats, Fatigue, Weight Gain, Weight Loss and Memory Loss. Skin:Not Present- Hives, Itching, Rash, Eczema and Lesions. HEENT:Not Present- Tinnitus, Headache, Double Vision, Visual Loss, Hearing Loss and Dentures. Respiratory:Not Present- Shortness of breath with exertion, Shortness of breath at  rest, Allergies, Coughing up blood and Chronic Cough. Cardiovascular:Present- Murmur. Not Present- Chest  Pain, Racing/skipping heartbeats, Difficulty Breathing Lying Down, Swelling and Palpitations. Gastrointestinal:Not Present- Bloody Stool, Heartburn, Abdominal Pain, Vomiting, Nausea, Constipation, Diarrhea, Difficulty Swallowing, Jaundice and Loss of appetitie. Female Genitourinary:Not Present- Blood in Urine, Urinary frequency, Weak urinary stream, Discharge, Flank Pain, Incontinence, Painful Urination, Urgency, Urinary Retention and Urinating at Night. Musculoskeletal:Present- Joint Pain (In the hip joint and the left knee.), Back Pain (In the lower back, with a catch on the right side.) and Morning Stiffness. Not Present- Muscle Weakness, Muscle Pain, Joint Swelling and Spasms. Neurological:Not Present- Tremor, Dizziness, Blackout spells, Paralysis, Difficulty with balance and Weakness. Psychiatric:Not Present- Insomnia.   Vitals Weight: 140 lb Height: 66.75 in Weight was reported by patient. Height was reported by patient. Body Surface Area: 1.73 m Body Mass Index: 22.09 kg/m Pulse: 54 (Regular) Resp.: 12 (Unlabored) BP: 142/86 (Sitting, Left Arm, Standard)    Physical Exam The physical exam findings are as follows:   General Mental Status - Alert, cooperative and good historian. General Appearance- pleasant. Not in acute distress. Orientation- Oriented X3. Build & Nutrition- Well nourished and Well developed.   Head and Neck Head- normocephalic, atraumatic . Neck Global Assessment- supple. no bruit auscultated on the right and no bruit auscultated on the left.   Eye Vision- Wears corrective lenses. Pupil- Bilateral- Regular and Round. Motion- Bilateral- EOMI.   Chest and Lung Exam Auscultation: Breath sounds:- clear at anterior chest wall and - clear at posterior chest wall. Adventitious sounds:- No Adventitious  sounds.   Cardiovascular Auscultation:Rhythm- Regular rate and rhythm. Heart Sounds- S1 WNL and S2 WNL. Murmurs & Other Heart Sounds:Auscultation of the heart reveals - No Murmurs.   Abdomen Palpation/Percussion:Tenderness- Abdomen is non-tender to palpation. Rigidity (guarding)- Abdomen is soft. Auscultation:Auscultation of the abdomen reveals - Bowel sounds normal.   Female Genitourinary  Not done, not pertinent to present illness  Musculoskeletal Well developed female alert and oriented in no apparent distress. Evaluation of her left hip flexion 90, no internal rotation, about 5 degrees of external rotation and about 15 to 20 degrees of abduction. Right hip flexion about 110, rotation in 10, out 30, abduction 30.  RADIOGRAPHS AP pelvis, lateral of the left hip show endstage severe arthritis left hip bone on bone throughout with large osteophyte formation. Right hip is arthritic also but to a lesser degree.  Assessment & Plan Osteoarthritis, Hip (715.35) Impression: Left Hip  Note: Plan is for a Left Total Hip Replacement - Anterior Approach by Dr. Lequita Halt.  Plan is to go home.  The patient does not have any contraindications and will recieve TXA (tranexamic acid) prior to surgery.  PCP - Dr. Vianne Bulls - Patient has been seen preoperatively and felt to be stable for surgery.  Signed electronically by Lauraine Rinne, III PA-C

## 2013-03-24 ENCOUNTER — Inpatient Hospital Stay (HOSPITAL_COMMUNITY): Payer: Medicare Other

## 2013-03-24 ENCOUNTER — Encounter (HOSPITAL_COMMUNITY): Payer: Self-pay | Admitting: Anesthesiology

## 2013-03-24 ENCOUNTER — Encounter (HOSPITAL_COMMUNITY): Payer: Self-pay | Admitting: *Deleted

## 2013-03-24 ENCOUNTER — Encounter (HOSPITAL_COMMUNITY)
Admission: RE | Disposition: A | Discharge status: Home Health | Payer: Self-pay | Source: Ambulatory Visit | Attending: Orthopedic Surgery

## 2013-03-24 ENCOUNTER — Inpatient Hospital Stay (HOSPITAL_COMMUNITY): Payer: Medicare Other | Admitting: Anesthesiology

## 2013-03-24 ENCOUNTER — Inpatient Hospital Stay (HOSPITAL_COMMUNITY)
Admission: RE | Admit: 2013-03-24 | Discharge: 2013-03-25 | DRG: 470 | Disposition: A | Discharge status: Home Health | Payer: Medicare Other | Source: Ambulatory Visit | Attending: Orthopedic Surgery | Admitting: Orthopedic Surgery

## 2013-03-24 DIAGNOSIS — Z96649 Presence of unspecified artificial hip joint: Secondary | ICD-10-CM

## 2013-03-24 DIAGNOSIS — D62 Acute posthemorrhagic anemia: Secondary | ICD-10-CM | POA: Diagnosis not present

## 2013-03-24 DIAGNOSIS — M169 Osteoarthritis of hip, unspecified: Secondary | ICD-10-CM

## 2013-03-24 DIAGNOSIS — M1610 Unilateral primary osteoarthritis, unspecified hip: Principal | ICD-10-CM | POA: Diagnosis present

## 2013-03-24 DIAGNOSIS — E7800 Pure hypercholesterolemia, unspecified: Secondary | ICD-10-CM | POA: Diagnosis present

## 2013-03-24 HISTORY — PX: TOTAL HIP ARTHROPLASTY: SHX124

## 2013-03-24 HISTORY — DX: Osteoarthritis of hip, unspecified: M16.9

## 2013-03-24 LAB — TYPE AND SCREEN
ABO/RH(D): O POS
Antibody Screen: NEGATIVE

## 2013-03-24 SURGERY — ARTHROPLASTY, HIP, TOTAL, ANTERIOR APPROACH
Anesthesia: Spinal | Site: Hip | Laterality: Left | Wound class: Clean

## 2013-03-24 MED ORDER — DOCUSATE SODIUM 100 MG PO CAPS
100.0000 mg | ORAL_CAPSULE | Freq: Two times a day (BID) | ORAL | Status: DC
  Administered 2013-03-24 – 2013-03-25 (×2): 100 mg via ORAL

## 2013-03-24 MED ORDER — PHENOL 1.4 % MT LIQD: 1.0000 | OROMUCOSAL | Status: DC | PRN

## 2013-03-24 MED ORDER — MENTHOL 3 MG MT LOZG: 1.0000 | LOZENGE | OROMUCOSAL | Status: DC | PRN

## 2013-03-24 MED ORDER — RIVAROXABAN 10 MG PO TABS
10.0000 mg | ORAL_TABLET | Freq: Every day | ORAL | Status: DC
  Administered 2013-03-25: 10 mg via ORAL
  Filled 2013-03-24 (×2): qty 1

## 2013-03-24 MED ORDER — DEXAMETHASONE SODIUM PHOSPHATE 10 MG/ML IJ SOLN
INTRAMUSCULAR | Status: DC | PRN
  Administered 2013-03-24: 10 mg via INTRAVENOUS

## 2013-03-24 MED ORDER — DIPHENHYDRAMINE HCL 12.5 MG/5ML PO ELIX: 12.5000 mg | ORAL_SOLUTION | ORAL | Status: DC | PRN

## 2013-03-24 MED ORDER — ESTRADIOL 0.025 MG/24HR TD PTWK: 0.0250 mg | MEDICATED_PATCH | TRANSDERMAL | Status: DC

## 2013-03-24 MED ORDER — KETOROLAC TROMETHAMINE 15 MG/ML IJ SOLN: 7.5000 mg | Freq: Four times a day (QID) | INTRAMUSCULAR | Status: AC | PRN

## 2013-03-24 MED ORDER — DEXAMETHASONE 6 MG PO TABS
10.0000 mg | ORAL_TABLET | Freq: Every day | ORAL | Status: AC
  Administered 2013-03-25: 10 mg via ORAL
  Filled 2013-03-24: qty 1

## 2013-03-24 MED ORDER — METHOCARBAMOL 500 MG PO TABS
500.0000 mg | ORAL_TABLET | Freq: Four times a day (QID) | ORAL | Status: DC | PRN
  Administered 2013-03-24 – 2013-03-25 (×2): 500 mg via ORAL
  Filled 2013-03-24 (×2): qty 1

## 2013-03-24 MED ORDER — ROPIVACAINE HCL 5 MG/ML IJ SOLN
INTRAMUSCULAR | Status: DC | PRN
  Administered 2013-03-24: 12.5 mg via EPIDURAL

## 2013-03-24 MED ORDER — PROPOFOL INFUSION 10 MG/ML OPTIME
INTRAVENOUS | Status: DC | PRN
  Administered 2013-03-24: 120 ug/kg/min via INTRAVENOUS

## 2013-03-24 MED ORDER — CHLORHEXIDINE GLUCONATE 4 % EX LIQD
60.0000 mL | Freq: Once | CUTANEOUS | Status: DC
  Filled 2013-03-24: qty 60

## 2013-03-24 MED ORDER — 0.9 % SODIUM CHLORIDE (POUR BTL) OPTIME
TOPICAL | Status: DC | PRN
  Administered 2013-03-24: 1000 mL

## 2013-03-24 MED ORDER — BISACODYL 10 MG RE SUPP: 10.0000 mg | Freq: Every day | RECTAL | Status: DC | PRN

## 2013-03-24 MED ORDER — KETOROLAC TROMETHAMINE 30 MG/ML IJ SOLN: 15.0000 mg | Freq: Once | INTRAMUSCULAR | Status: DC | PRN

## 2013-03-24 MED ORDER — DEXAMETHASONE SODIUM PHOSPHATE 10 MG/ML IJ SOLN
10.0000 mg | Freq: Every day | INTRAMUSCULAR | Status: AC
  Filled 2013-03-24: qty 1

## 2013-03-24 MED ORDER — ACETAMINOPHEN 500 MG PO TABS
1000.0000 mg | ORAL_TABLET | Freq: Once | ORAL | Status: AC
  Administered 2013-03-24: 1000 mg via ORAL
  Filled 2013-03-24: qty 2

## 2013-03-24 MED ORDER — ONDANSETRON HCL 4 MG PO TABS: 4.0000 mg | ORAL_TABLET | Freq: Four times a day (QID) | ORAL | Status: DC | PRN

## 2013-03-24 MED ORDER — ACETAMINOPHEN 500 MG PO TABS
1000.0000 mg | ORAL_TABLET | Freq: Four times a day (QID) | ORAL | Status: AC
  Administered 2013-03-24 – 2013-03-25 (×4): 1000 mg via ORAL
  Filled 2013-03-24 (×4): qty 2

## 2013-03-24 MED ORDER — METOCLOPRAMIDE HCL 5 MG/ML IJ SOLN: 5.0000 mg | Freq: Three times a day (TID) | INTRAMUSCULAR | Status: DC | PRN

## 2013-03-24 MED ORDER — ZOLPIDEM TARTRATE 5 MG PO TABS
5.0000 mg | ORAL_TABLET | Freq: Every evening | ORAL | Status: DC | PRN
  Administered 2013-03-25: 5 mg via ORAL
  Filled 2013-03-24: qty 1

## 2013-03-24 MED ORDER — METOCLOPRAMIDE HCL 5 MG PO TABS
5.0000 mg | ORAL_TABLET | Freq: Three times a day (TID) | ORAL | Status: DC | PRN
  Filled 2013-03-24: qty 2

## 2013-03-24 MED ORDER — FLEET ENEMA 7-19 GM/118ML RE ENEM: 1.0000 | ENEMA | Freq: Once | RECTAL | Status: AC | PRN

## 2013-03-24 MED ORDER — SODIUM CHLORIDE 0.9 % IV SOLN: INTRAVENOUS | Status: DC

## 2013-03-24 MED ORDER — ACETAMINOPHEN 650 MG RE SUPP
650.0000 mg | Freq: Four times a day (QID) | RECTAL | Status: AC
  Filled 2013-03-24: qty 1

## 2013-03-24 MED ORDER — TRANEXAMIC ACID 100 MG/ML IV SOLN
1000.0000 mg | INTRAVENOUS | Status: AC
  Administered 2013-03-24: 1000 mg via INTRAVENOUS
  Filled 2013-03-24: qty 10

## 2013-03-24 MED ORDER — MIDAZOLAM HCL 5 MG/5ML IJ SOLN
INTRAMUSCULAR | Status: DC | PRN
  Administered 2013-03-24: 2 mg via INTRAVENOUS

## 2013-03-24 MED ORDER — MORPHINE SULFATE 10 MG/ML IJ SOLN: 1.0000 mg | INTRAMUSCULAR | Status: DC | PRN

## 2013-03-24 MED ORDER — OXYCODONE HCL 5 MG PO TABS
5.0000 mg | ORAL_TABLET | ORAL | Status: DC | PRN
  Administered 2013-03-24 – 2013-03-25 (×4): 5 mg via ORAL
  Filled 2013-03-24 (×4): qty 1

## 2013-03-24 MED ORDER — BUPIVACAINE LIPOSOME 1.3 % IJ SUSP
20.0000 mL | Freq: Once | INTRAMUSCULAR | Status: DC
  Filled 2013-03-24: qty 20

## 2013-03-24 MED ORDER — BUPIVACAINE HCL 0.25 % IJ SOLN
INTRAMUSCULAR | Status: DC | PRN
  Administered 2013-03-24: 20 mL

## 2013-03-24 MED ORDER — KCL IN DEXTROSE-NACL 20-5-0.9 MEQ/L-%-% IV SOLN
INTRAVENOUS | Status: DC
  Administered 2013-03-24: 75 mL/h via INTRAVENOUS
  Administered 2013-03-25: 03:00:00 via INTRAVENOUS
  Filled 2013-03-24 (×3): qty 1000

## 2013-03-24 MED ORDER — HYDROMORPHONE HCL PF 1 MG/ML IJ SOLN: 0.2500 mg | INTRAMUSCULAR | Status: DC | PRN

## 2013-03-24 MED ORDER — CEFAZOLIN SODIUM 1-5 GM-% IV SOLN
1.0000 g | Freq: Four times a day (QID) | INTRAVENOUS | Status: AC
  Administered 2013-03-24 (×2): 1 g via INTRAVENOUS
  Filled 2013-03-24 (×2): qty 50

## 2013-03-24 MED ORDER — PROMETHAZINE HCL 25 MG/ML IJ SOLN
6.2500 mg | INTRAMUSCULAR | Status: DC | PRN
  Administered 2013-03-24: 12.5 mg via INTRAVENOUS

## 2013-03-24 MED ORDER — FENTANYL CITRATE 0.05 MG/ML IJ SOLN
INTRAMUSCULAR | Status: DC | PRN
  Administered 2013-03-24: 100 ug via INTRAVENOUS

## 2013-03-24 MED ORDER — SODIUM CHLORIDE 0.9 % IJ SOLN
INTRAMUSCULAR | Status: DC | PRN
  Administered 2013-03-24: 11:00:00

## 2013-03-24 MED ORDER — CEFAZOLIN SODIUM-DEXTROSE 2-3 GM-% IV SOLR
2.0000 g | INTRAVENOUS | Status: AC
  Administered 2013-03-24: 2 g via INTRAVENOUS

## 2013-03-24 MED ORDER — POLYETHYLENE GLYCOL 3350 17 G PO PACK: 17.0000 g | PACK | Freq: Every day | ORAL | Status: DC | PRN

## 2013-03-24 MED ORDER — TRAMADOL HCL 50 MG PO TABS: 50.0000 mg | ORAL_TABLET | Freq: Four times a day (QID) | ORAL | Status: DC | PRN

## 2013-03-24 MED ORDER — LACTATED RINGERS IV SOLN
INTRAVENOUS | Status: DC | PRN
  Administered 2013-03-24 (×2): via INTRAVENOUS

## 2013-03-24 MED ORDER — ONDANSETRON HCL 4 MG/2ML IJ SOLN: 4.0000 mg | Freq: Four times a day (QID) | INTRAMUSCULAR | Status: DC | PRN

## 2013-03-24 MED ORDER — METHOCARBAMOL 100 MG/ML IJ SOLN
500.0000 mg | Freq: Four times a day (QID) | INTRAVENOUS | Status: DC | PRN
  Filled 2013-03-24: qty 5

## 2013-03-24 SURGICAL SUPPLY — 41 items
BAG ZIPLOCK 12X15 (MISCELLANEOUS) IMPLANT
BLADE SAW SGTL 18X1.27X75 (BLADE) ×2 IMPLANT
CAPT HIP PF COP ×2 IMPLANT
CLOTH BEACON ORANGE TIMEOUT ST (SAFETY) ×2 IMPLANT
DECANTER SPIKE VIAL GLASS SM (MISCELLANEOUS) ×2 IMPLANT
DRAPE C-ARM 42X120 X-RAY (DRAPES) ×2 IMPLANT
DRAPE STERI IOBAN 125X83 (DRAPES) ×2 IMPLANT
DRAPE U-SHAPE 47X51 STRL (DRAPES) ×6 IMPLANT
DRSG ADAPTIC 3X8 NADH LF (GAUZE/BANDAGES/DRESSINGS) ×2 IMPLANT
DRSG MEPILEX BORDER 4X4 (GAUZE/BANDAGES/DRESSINGS) ×2 IMPLANT
DRSG MEPILEX BORDER 4X8 (GAUZE/BANDAGES/DRESSINGS) ×2 IMPLANT
DURAPREP 26ML APPLICATOR (WOUND CARE) ×2 IMPLANT
ELECT BLADE 6.5 EXT (BLADE) ×2 IMPLANT
ELECT REM PT RETURN 9FT ADLT (ELECTROSURGICAL) ×2
ELECTRODE REM PT RTRN 9FT ADLT (ELECTROSURGICAL) ×1 IMPLANT
EVACUATOR 1/8 PVC DRAIN (DRAIN) ×2 IMPLANT
FACESHIELD LNG OPTICON STERILE (SAFETY) ×6 IMPLANT
GLOVE BIO SURGEON STRL SZ7.5 (GLOVE) ×2 IMPLANT
GLOVE BIO SURGEON STRL SZ8 (GLOVE) ×2 IMPLANT
GLOVE BIOGEL PI IND STRL 8 (GLOVE) ×2 IMPLANT
GLOVE BIOGEL PI INDICATOR 8 (GLOVE) ×2
GOWN STRL NON-REIN LRG LVL3 (GOWN DISPOSABLE) ×2 IMPLANT
GOWN STRL REIN XL XLG (GOWN DISPOSABLE) ×4 IMPLANT
KIT BASIN OR (CUSTOM PROCEDURE TRAY) ×2 IMPLANT
NDL SAFETY ECLIPSE 18X1.5 (NEEDLE) ×2 IMPLANT
NEEDLE HYPO 18GX1.5 SHARP (NEEDLE) ×2
PACK TOTAL JOINT (CUSTOM PROCEDURE TRAY) ×2 IMPLANT
PADDING CAST COTTON 6X4 STRL (CAST SUPPLIES) ×2 IMPLANT
STRIP CLOSURE SKIN 1/2X4 (GAUZE/BANDAGES/DRESSINGS) ×4 IMPLANT
SUCTION FRAZIER 12FR DISP (SUCTIONS) ×2 IMPLANT
SUT ETHIBOND NAB CT1 #1 30IN (SUTURE) ×2 IMPLANT
SUT MNCRL AB 4-0 PS2 18 (SUTURE) ×2 IMPLANT
SUT VIC AB 1 CT1 27 (SUTURE) ×1
SUT VIC AB 1 CT1 27XBRD ANTBC (SUTURE) ×1 IMPLANT
SUT VIC AB 2-0 CT1 27 (SUTURE) ×2
SUT VIC AB 2-0 CT1 TAPERPNT 27 (SUTURE) ×2 IMPLANT
SUT VLOC 180 0 24IN GS25 (SUTURE) ×2 IMPLANT
SYR 20CC LL (SYRINGE) ×2 IMPLANT
SYR 50ML LL SCALE MARK (SYRINGE) ×2 IMPLANT
TOWEL OR 17X26 10 PK STRL BLUE (TOWEL DISPOSABLE) ×2 IMPLANT
TRAY FOLEY CATH 14FRSI W/METER (CATHETERS) ×2 IMPLANT

## 2013-03-24 NOTE — Progress Notes (Signed)
Utilization review completed.  

## 2013-03-24 NOTE — Op Note (Signed)
OPERATIVE REPORT  PREOPERATIVE DIAGNOSIS: Osteoarthritis of the Left hip.   POSTOPERATIVE DIAGNOSIS: Osteoarthritis of the Left  hip.   PROCEDURE: Left total hip arthroplasty, anterior approach.   SURGEON: Ollen Gross, MD   ASSISTANT: Avel Peace, PA-C  ANESTHESIA:  Spinal  ESTIMATED BLOOD LOSS:-300 ml  DRAINS: Hemovac x1.   COMPLICATIONS: None   CONDITION: PACU - hemodynamically stable.   BRIEF CLINICAL NOTE: Brittany Shaffer is a 67 y.o. female who has advanced end-  stage arthritis of his Left  hip with progressively worsening pain and  dysfunction.The patient has failed nonoperative management and presents for  total hip arthroplasty.   PROCEDURE IN DETAIL: After successful administration of spinal  anesthetic, the traction boots for the [facility] bed were placed on both  feet and the patient was placed onto the [facility] bed, boots placed into the leg  holders. The Left hip was then isolated from the perineum with plastic  drapes and prepped and draped in the usual sterile fashion. ASIS and  greater trochanter were marked and a oblique incision was made, starting  at about 1 cm lateral and 2 cm distal to the ASIS and coursing towards  the anterior cortex of the femur. The skin was cut with a 10 blade  through subcutaneous tissue to the level of the fascia overlying the  tensor fascia lata muscle. The fascia was then incised in line with the  incision at the junction of the anterior third and posterior 2/3rd. The  muscle was teased off the fascia and then the interval between the TFL  and the rectus was developed. The Hohmann retractor was then placed at  the top of the femoral neck over the capsule. The vessels overlying the  capsule were cauterized and the fat on top of the capsule was removed.  A Hohmann retractor was then placed anterior underneath the rectus  femoris to give exposure to the entire anterior capsule. A T-shaped  capsulotomy was performed. The edges  were tagged and the femoral head  was identified.       Osteophytes are removed off the superior acetabulum.  The femoral neck was then cut in situ with an oscillating saw. Traction  was then applied to the left lower extremity utilizing the [facility]  traction. The femoral head was then removed. Retractors were placed  around the acetabulum and then circumferential removal of the labrum was  performed. Osteophytes were also removed. Reaming starts at 45 mm to  medialize and  Increased in 2 mm increments to 49 mm. We reamed in  approximately 40 degrees of abduction, 20 degrees anteversion. A 50 mm  pinnacle acetabular shell was then impacted in anatomic position under  fluoroscopic guidance with excellent purchase. We did not need to place  any additional dome screws. A 32 mm neutral + 4 marathon liner was then  placed into the acetabular shell.       The femoral lift was then placed along the lateral aspect of the femur  just distal to the vastus ridge. The leg was  externally rotated and capsule  was stripped off the inferior aspect of the femoral neck down to the  level of the lesser trochanter, this was done with electrocautery. The femur was lifted after this was performed. The  leg was then placed and extended in adducted position to essentially delivering the femur. We also removed the capsule superiorly and the  piriformis from the piriformis fossa  to gain excellent exposure of the  proximal femur. Rongeur was used to remove some cancellous bone to get  into the lateral portion of the proximal femur for placement of the  initial starter reamer. The starter broaches was placed  the starter broach  and was shown to go down the center of the canal. Broaching  with the  Corail system was then performed starting at size 8, coursing  Up to size 12. A size 12 had excellent torsional and rotational  and axial stability. The trial standard offset neck was then placed  with a 32 + 1 trial head.  The hip was then reduced. We confirmed that  the stem was in the canal both on AP and lateral x-rays. It also has excellent sizing. The hip was reduced with outstanding stability through full extension, full external rotation,  and then flexion in adduction internal rotation. AP pelvis was taken  and the leg lengths were measured and found to be exactly equal. Hip  was then dislocated again and the femoral head and neck removed. The  femoral broach was removed. Size 12 Corail stem with a standard offset  neck was then impacted into the femur following native anteversion. Has  excellent purchase in the canal. Excellent torsional and rotational and  axial stability. It is confirmed to be in the canal on AP and lateral  fluoroscopic views. The 32 + 1  ceramic head was placed and the hip  reduced with outstanding stability. Again AP pelvis was taken and it  confirmed that the leg lengths were equal. The wound was then copiously  irrigated with saline solution and the capsule reattached and repaired  with Ethibond suture.  20 mL of Exparel mixed with 50 mL of saline then additional 20 ml of .25% Bupivicaine injected into the capsule and into the edge of the tensor fascia lata as well as subcutaneous tissue. The fascia overlying the tensor fascia lata was  then closed with a running #1 V-Loc. Subcu was closed with interrupted  2-0 Vicryl and subcuticular running 4-0 Monocryl. Incision was cleaned  and dried. Steri-Strips and a bulky sterile dressing applied. Hemovac  drain was hooked to suction and then he was awakened and transported to  recovery in stable condition.        Please note that a surgical assistant was a medical necessity for this procedure to perform it in a safe and expeditious manner. Assistant was necessary to provide appropriate retraction of vital neurovascular structures and to prevent femoral fracture and allow for anatomic placement of the prosthesis.  Ollen Gross, M.D.

## 2013-03-24 NOTE — H&P (View-Only) (Signed)
Brittany Shaffer  DOB: 03/06/1946 Married / Language: English / Race: White Female  Date of Admission:  03/24/2013  Chief Complaint:  Left hip pain  History of Present Illness The patient is a 67 year old female who comes in for a preoperative History and Physical. The patient is scheduled for a left total hip arthroplasty (anterior approach) to be performed by Dr. Frank V. Aluisio, MD at [facility] on 03/24/2013. The patient is a 67 year old female who presents for follow up of their hip. The patient is being followed for their left hip pain and osteoarthritis. They are out from intra-articular injection. Symptoms reported today include: pain and locking (right low back area). Current treatment includes: NSAIDs. The following medication has been used for pain control: antiinflammatory medication. Note for "Follow-up Hip": Injection did not help as well as the first injection did last year. Pain in groin down to the knee. Voltaren gel did help. Unfortunately her hip pain and dysfunction is getting worse. She is unable to do things she desires. She likes to play tennis and if she tries to play then she will have to stop playing for several days after. She is having some discomfort at night. It does wake her up at night at times. She is having more limited range of motion. She had an intra-articular injection recently which did only help some. She is now ready to proceed with surgery. They have been treated conservatively in the past for the above stated problem and despite conservative measures, they continue to have progressive pain and severe functional limitations and dysfunction. They have failed non-operative management including home exercise, medications, and injections. It is felt that they would benefit from undergoing total joint replacement. Risks and benefits of the procedure have been discussed with the patient and they elect to proceed with surgery. There are no active  contraindications to surgery such as ongoing infection or rapidly progressive neurological disease.   Problem List Osteoarthritis, Hip (715.35)   Allergies Codeine/Codeine Derivatives. intolerance   Family History Hypertension. mother Cancer. mother, father and sister   Social History Marital status. married Number of flights of stairs before winded. greater than 5 Living situation. live with spouse Exercise. Exercises daily; does running / walking and team sport Illicit drug use. no Pain Contract. no Tobacco use. never smoker Previously in rehab. no Tobacco / smoke exposure. no Drug/Alcohol Rehab (Currently). no Children. 2 Current work status. retired Alcohol use. current drinker; drinks beer, wine and hard liquor; 5-7 per week Advance Directives. Living Will, Healthcare POA Post-Surgical Plans. Plan is to go home.   Medication History Estradiol (0.025MG/24HR Patch Weekly, Transdermal) Active. Etodolac (500MG Tablet ER, Oral) Active. Ambien ( Oral) Specific dose unknown - Active. (1/2 tab prn) Voltaren (1% Gel, Transdermal) Active. (prn when she plays tennis)  Past Surgical History Tubal Ligation Hysterectomy (not due to cancer) - Complete. Post-op complications. incontinence, which has resolved   Medical History Chronic Pain Heart murmur Hypercholesterolemia Osteoarthritis Degeneration, lumbar/lumbosacral disc (722.52). 06/30/1997 Spondylosis, lumbosacral (721.3). 06/30/1997  Review of Systems General:Present- Weight Loss > 10lbs. (She reports weight loss in the past year after her hysterectomy.). Not Present- Chills, Fever, Night Sweats, Fatigue, Weight Gain, Weight Loss and Memory Loss. Skin:Not Present- Hives, Itching, Rash, Eczema and Lesions. HEENT:Not Present- Tinnitus, Headache, Double Vision, Visual Loss, Hearing Loss and Dentures. Respiratory:Not Present- Shortness of breath with exertion, Shortness of breath at  rest, Allergies, Coughing up blood and Chronic Cough. Cardiovascular:Present- Murmur. Not Present- Chest   Pain, Racing/skipping heartbeats, Difficulty Breathing Lying Down, Swelling and Palpitations. Gastrointestinal:Not Present- Bloody Stool, Heartburn, Abdominal Pain, Vomiting, Nausea, Constipation, Diarrhea, Difficulty Swallowing, Jaundice and Loss of appetitie. Female Genitourinary:Not Present- Blood in Urine, Urinary frequency, Weak urinary stream, Discharge, Flank Pain, Incontinence, Painful Urination, Urgency, Urinary Retention and Urinating at Night. Musculoskeletal:Present- Joint Pain (In the hip joint and the left knee.), Back Pain (In the lower back, with a catch on the right side.) and Morning Stiffness. Not Present- Muscle Weakness, Muscle Pain, Joint Swelling and Spasms. Neurological:Not Present- Tremor, Dizziness, Blackout spells, Paralysis, Difficulty with balance and Weakness. Psychiatric:Not Present- Insomnia.   Vitals Weight: 140 lb Height: 66.75 in Weight was reported by patient. Height was reported by patient. Body Surface Area: 1.73 m Body Mass Index: 22.09 kg/m Pulse: 54 (Regular) Resp.: 12 (Unlabored) BP: 142/86 (Sitting, Left Arm, Standard)    Physical Exam The physical exam findings are as follows:   General Mental Status - Alert, cooperative and good historian. General Appearance- pleasant. Not in acute distress. Orientation- Oriented X3. Build & Nutrition- Well nourished and Well developed.   Head and Neck Head- normocephalic, atraumatic . Neck Global Assessment- supple. no bruit auscultated on the right and no bruit auscultated on the left.   Eye Vision- Wears corrective lenses. Pupil- Bilateral- Regular and Round. Motion- Bilateral- EOMI.   Chest and Lung Exam Auscultation: Breath sounds:- clear at anterior chest wall and - clear at posterior chest wall. Adventitious sounds:- No Adventitious  sounds.   Cardiovascular Auscultation:Rhythm- Regular rate and rhythm. Heart Sounds- S1 WNL and S2 WNL. Murmurs & Other Heart Sounds:Auscultation of the heart reveals - No Murmurs.   Abdomen Palpation/Percussion:Tenderness- Abdomen is non-tender to palpation. Rigidity (guarding)- Abdomen is soft. Auscultation:Auscultation of the abdomen reveals - Bowel sounds normal.   Female Genitourinary  Not done, not pertinent to present illness  Musculoskeletal Well developed female alert and oriented in no apparent distress. Evaluation of her left hip flexion 90, no internal rotation, about 5 degrees of external rotation and about 15 to 20 degrees of abduction. Right hip flexion about 110, rotation in 10, out 30, abduction 30.  RADIOGRAPHS AP pelvis, lateral of the left hip show endstage severe arthritis left hip bone on bone throughout with large osteophyte formation. Right hip is arthritic also but to a lesser degree.  Assessment & Plan Osteoarthritis, Hip (715.35) Impression: Left Hip  Note: Plan is for a Left Total Hip Replacement - Anterior Approach by Dr. Aluisio.  Plan is to go home.  The patient does not have any contraindications and will recieve TXA (tranexamic acid) prior to surgery.  PCP - Dr. C. Alan [name] - Patient has been seen preoperatively and felt to be stable for surgery.  Signed electronically by Shikara Mcauliffe L Domenick Quebedeaux, III PA-C  

## 2013-03-24 NOTE — Interval H&P Note (Signed)
History and Physical Interval Note:  03/24/2013 9:51 AM  Brittany Shaffer  has presented today for surgery, with the diagnosis of OSTEOARTHRITIS OF LEFT HIP  The various methods of treatment have been discussed with the patient and family. After consideration of risks, benefits and other options for treatment, the patient has consented to  Procedure(s): LEFT TOTAL HIP ARTHROPLASTY ANTERIOR APPROACH (Left) as a surgical intervention .  The patient's history has been reviewed, patient examined, no change in status, stable for surgery.  I have reviewed the patient's chart and labs.  Questions were answered to the patient's satisfaction.     Loanne Drilling

## 2013-03-24 NOTE — Progress Notes (Signed)
Dr. Okey Dupre made aware of heart rate 40's; no new orders at present; will continue to observe

## 2013-03-24 NOTE — Anesthesia Postprocedure Evaluation (Signed)
  Anesthesia Post-op Note  Patient: Brittany Shaffer  Procedure(s) Performed: Procedure(s) (LRB): LEFT TOTAL HIP ARTHROPLASTY ANTERIOR APPROACH (Left)  Patient Location: PACU  Anesthesia Type: Spinal  Level of Consciousness: awake and alert   Airway and Oxygen Therapy: Patient Spontanous Breathing  Post-op Pain: mild  Post-op Assessment: Post-op Vital signs reviewed, Patient's Cardiovascular Status Stable, Respiratory Function Stable, Patent Airway and No signs of Nausea or vomiting  Last Vitals:  Filed Vitals:   03/24/13 1215  BP:   Pulse: 43  Temp:   Resp: 9    Post-op Vital Signs: stable   Complications: No apparent anesthesia complications

## 2013-03-24 NOTE — Transfer of Care (Signed)
Immediate Anesthesia Transfer of Care Note  Patient: Brittany Shaffer  Procedure(s) Performed: Procedure(s): LEFT TOTAL HIP ARTHROPLASTY ANTERIOR APPROACH (Left)  Patient Location: PACU  Anesthesia Type:MAC and Spinal  Level of Consciousness: awake and alert   Airway & Oxygen Therapy: Patient Spontanous Breathing and Patient connected to face mask oxygen  Post-op Assessment: Report given to PACU RN and Post -op Vital signs reviewed and stable  Post vital signs: Reviewed and stable  Complications: No apparent anesthesia complications

## 2013-03-24 NOTE — Anesthesia Preprocedure Evaluation (Signed)
Anesthesia Evaluation  Patient identified by MRN, date of birth, ID band Patient awake    Reviewed: Allergy & Precautions, H&P , NPO status , Patient's Chart, lab work & pertinent test results  Airway Mallampati: II TM Distance: >3 FB Neck ROM: Full    Dental no notable dental hx.    Pulmonary neg pulmonary ROS,  breath sounds clear to auscultation  Pulmonary exam normal       Cardiovascular negative cardio ROS  Rhythm:Regular Rate:Normal     Neuro/Psych negative neurological ROS  negative psych ROS   GI/Hepatic negative GI ROS, Neg liver ROS,   Endo/Other  negative endocrine ROS  Renal/GU negative Renal ROS  negative genitourinary   Musculoskeletal negative musculoskeletal ROS (+)   Abdominal   Peds negative pediatric ROS (+)  Hematology negative hematology ROS (+)   Anesthesia Other Findings   Reproductive/Obstetrics negative OB ROS                           Anesthesia Physical Anesthesia Plan  ASA: I  Anesthesia Plan: Spinal   Post-op Pain Management:    Induction: Intravenous  Airway Management Planned: Simple Face Mask  Additional Equipment:   Intra-op Plan:   Post-operative Plan:   Informed Consent: I have reviewed the patients History and Physical, chart, labs and discussed the procedure including the risks, benefits and alternatives for the proposed anesthesia with the patient or authorized representative who has indicated his/her understanding and acceptance.   Dental advisory given  Plan Discussed with: CRNA and Surgeon  Anesthesia Plan Comments:         Anesthesia Quick Evaluation  

## 2013-03-24 NOTE — Anesthesia Procedure Notes (Signed)
Spinal  Patient location during procedure: OR Staffing Performed by: anesthesiologist  Preanesthetic Checklist Completed: patient identified, site marked, surgical consent, pre-op evaluation, timeout performed, IV checked, risks and benefits discussed and monitors and equipment checked Spinal Block Patient position: sitting Prep: Betadine Patient monitoring: heart rate, continuous pulse ox and blood pressure Injection technique: single-shot Needle Needle type: Quincke  Needle gauge: 24 G Needle length: 9 cm Additional Notes Expiration date of kit checked and confirmed. Patient tolerated procedure well, without complications.     

## 2013-03-25 ENCOUNTER — Encounter (HOSPITAL_COMMUNITY): Payer: Self-pay | Admitting: Orthopedic Surgery

## 2013-03-25 DIAGNOSIS — D62 Acute posthemorrhagic anemia: Secondary | ICD-10-CM

## 2013-03-25 LAB — CBC
HCT: 31.9 % — ABNORMAL LOW (ref 36.0–46.0)
MCHC: 34.5 g/dL (ref 30.0–36.0)
MCV: 98.2 fL (ref 78.0–100.0)
RDW: 13 % (ref 11.5–15.5)

## 2013-03-25 LAB — BASIC METABOLIC PANEL
BUN: 16 mg/dL (ref 6–23)
Calcium: 8.7 mg/dL (ref 8.4–10.5)
Creatinine, Ser: 0.81 mg/dL (ref 0.50–1.10)
GFR calc Af Amer: 84 mL/min — ABNORMAL LOW (ref >90)
GFR calc non Af Amer: 72 mL/min — ABNORMAL LOW (ref >90)

## 2013-03-25 MED ORDER — OXYCODONE HCL 5 MG PO TABS: 5.0000 mg | ORAL_TABLET | ORAL | Status: DC | PRN

## 2013-03-25 MED ORDER — RIVAROXABAN 10 MG PO TABS: 10.0000 mg | ORAL_TABLET | Freq: Every day | ORAL | Status: DC

## 2013-03-25 MED ORDER — TRAMADOL HCL 50 MG PO TABS: 50.0000 mg | ORAL_TABLET | Freq: Four times a day (QID) | ORAL | Status: DC | PRN

## 2013-03-25 MED ORDER — METHOCARBAMOL 500 MG PO TABS: 500.0000 mg | ORAL_TABLET | Freq: Four times a day (QID) | ORAL | Status: DC | PRN

## 2013-03-25 NOTE — Evaluation (Signed)
Physical Therapy Evaluation Patient Details Name: Brittany Shaffer MRN: [id number] DOB: Dec 10, 1945 Today's Date: 03/25/2013 Time: 1000-1030 PT Time Calculation (min): 30 min  PT Assessment / Plan / Recommendation History of Present Illness     Clinical Impression  Pt s/p L THR presents with decreased L LE strength/ROM and post op discomfort limiting functional mobility    PT Assessment  Patient needs continued PT services    Follow Up Recommendations  Home health PT    Does the patient have the potential to tolerate intense rehabilitation      Barriers to Discharge        Equipment Recommendations  None recommended by PT    Recommendations for Other Services OT consult   Frequency 7X/week    Precautions / Restrictions Precautions Precautions: Fall Restrictions Weight Bearing Restrictions: No LLE Weight Bearing: Weight bearing as tolerated   Pertinent Vitals/Pain 3/10, premed, ice pack provided      Mobility  Bed Mobility Bed Mobility: Supine to Sit Supine to Sit: 4: Min guard Details for Bed Mobility Assistance: cues for sequence and use of R LE to self assist Transfers Transfers: Sit to Stand;Stand to Sit Sit to Stand: 4: Min assist Stand to Sit: 4: Min guard Details for Transfer Assistance: cues for LE management and use of UEs to self assist` Ambulation/Gait Ambulation/Gait Assistance: 4: Min guard Ambulation Distance (Feet): 400 Feet Assistive device: 4-wheeled walker Ambulation/Gait Assistance Details: min cues for posture and position from RW Gait Pattern: Step-to pattern Stairs: No    Exercises Total Joint Exercises Ankle Circles/Pumps: AROM;Both;15 reps;Supine Quad Sets: AROM;Both;10 reps;Supine Heel Slides: AAROM;15 reps;Left;Supine Hip ABduction/ADduction: AAROM;15 reps;Left;Supine   PT Diagnosis: Difficulty walking  PT Problem List: Decreased strength;Decreased range of motion;Decreased activity tolerance;Decreased mobility;Pain;Decreased  knowledge of use of DME PT Treatment Interventions: DME instruction;Gait training;Stair training;Functional mobility training;Therapeutic activities;Therapeutic exercise;Patient/family education     PT Goals(Current goals can be found in the care plan section) Acute Rehab PT Goals Patient Stated Goal: Play tennis again sooner rather than later PT Goal Formulation: With patient Time For Goal Achievement: 04/01/13 Potential to Achieve Goals: Good  Visit Information  Last PT Received On: 03/25/13 Assistance Needed: +1       Prior Functioning  Home Living Family/patient expects to be discharged to:: Private residence Living Arrangements: Spouse/significant other Available Help at Discharge: Family Type of Home: House Home Access: Stairs to enter Secretary/administrator of Steps: 3 Entrance Stairs-Rails: None Home Layout: One level Home Equipment: Environmental consultant - 4 wheels Prior Function Level of Independence: Independent Communication Communication: No difficulties Dominant Hand: Right    Cognition  Cognition Arousal/Alertness: Awake/alert Behavior During Therapy: WFL for tasks assessed/performed Overall Cognitive Status: Within Functional Limits for tasks assessed    Extremity/Trunk Assessment Upper Extremity Assessment Upper Extremity Assessment: Overall WFL for tasks assessed Lower Extremity Assessment Lower Extremity Assessment: LLE deficits/detail LLE Deficits / Details: hip strength 3-/5 with AAROM at hip to 90 flex and 20 abd   Balance    End of Session PT - End of Session Equipment Utilized During Treatment: Gait belt Activity Tolerance: Patient tolerated treatment well Patient left: in chair;with call K[place]cker/phone within reach Nurse Communication: Mobility status  GP     Harpreet Signore 03/25/2013, 12:39 PM

## 2013-03-25 NOTE — Discharge Summary (Signed)
Physician Discharge Summary   Patient ID: Brittany Shaffer MRN: [id number] DOB/AGE: 1946/03/09 67 y.o.  Admit date: 03/24/2013 Discharge date: 03-25-2013  Primary Diagnosis:  Osteoarthritis of the Left hip.  Admission Diagnoses:  Past Medical History  Diagnosis Date  . Arthritis   . Leg cramps   . Heart murmur    Discharge Diagnoses:   Principal Problem:   OA (osteoarthritis) of hip Active Problems:   Postoperative anemia due to acute blood loss  Estimated body mass index is 23.09 kg/(m^2) as calculated from the following:   Height as of this encounter: 5\' 6"  (1.676 m).   Weight as of this encounter: 64.864 kg (143 lb).  Procedure(s) (LRB): LEFT TOTAL HIP ARTHROPLASTY ANTERIOR APPROACH (Left)   Consults: None  HPI: Brittany Shaffer is a 67 y.o. female who has advanced end-  stage arthritis of his Left hip with progressively worsening pain and  dysfunction.The patient has failed nonoperative management and presents for  total hip arthroplasty.   Laboratory Data: Admission on 03/24/2013  Component Date Value Range Status  . WBC 03/25/2013 11.9* 4.0 - 10.5 K/uL Final  . RBC 03/25/2013 3.25* 3.87 - 5.11 MIL/uL Final  . Hemoglobin 03/25/2013 11.0* 12.0 - 15.0 g/dL Final  . HCT 62/95/2841 31.9* 36.0 - 46.0 % Final  . MCV 03/25/2013 98.2  78.0 - 100.0 fL Final  . MCH 03/25/2013 33.8  26.0 - 34.0 pg Final  . MCHC 03/25/2013 34.5  30.0 - 36.0 g/dL Final  . RDW 32/44/0102 13.0  11.5 - 15.5 % Final  . Platelets 03/25/2013 165  150 - 400 K/uL Final  . Sodium 03/25/2013 139  135 - 145 mEq/L Final  . Potassium 03/25/2013 4.2  3.5 - 5.1 mEq/L Final  . Chloride 03/25/2013 105  96 - 112 mEq/L Final  . CO2 03/25/2013 28  19 - 32 mEq/L Final  . Glucose, Bld 03/25/2013 148* 70 - 99 mg/dL Final  . BUN 72/53/6644 16  6 - 23 mg/dL Final  . Creatinine, Ser 03/25/2013 0.81  0.50 - 1.10 mg/dL Final  . Calcium 03/47/4259 8.7  8.4 - 10.5 mg/dL Final  . GFR calc non Af Amer 03/25/2013 72* >90  mL/min Final  . GFR calc Af Amer 03/25/2013 84* >90 mL/min Final   Comment:                                 The eGFR has been calculated                          using the CKD EPI equation.                          This calculation has not been                          validated in all clinical                          situations.                          eGFR's persistently                          <90 mL/min  signify                          possible Chronic Kidney Disease.  Hospital Outpatient Visit on 03/16/2013  Component Date Value Range Status  . aPTT 03/16/2013 30  24 - 37 seconds Final  . WBC 03/16/2013 6.0  4.0 - 10.5 K/uL Final  . RBC 03/16/2013 3.92  3.87 - 5.11 MIL/uL Final  . Hemoglobin 03/16/2013 13.0  12.0 - 15.0 g/dL Final  . HCT 40/98/1191 39.0  36.0 - 46.0 % Final  . MCV 03/16/2013 99.5  78.0 - 100.0 fL Final  . MCH 03/16/2013 33.2  26.0 - 34.0 pg Final  . MCHC 03/16/2013 33.3  30.0 - 36.0 g/dL Final  . RDW 47/82/9562 13.0  11.5 - 15.5 % Final  . Platelets 03/16/2013 251  150 - 400 K/uL Final  . Sodium 03/16/2013 138  135 - 145 mEq/L Final  . Potassium 03/16/2013 3.6  3.5 - 5.1 mEq/L Final  . Chloride 03/16/2013 101  96 - 112 mEq/L Final  . CO2 03/16/2013 28  19 - 32 mEq/L Final  . Glucose, Bld 03/16/2013 95  70 - 99 mg/dL Final  . BUN 13/04/6577 22  6 - 23 mg/dL Final  . Creatinine, Ser 03/16/2013 0.78  0.50 - 1.10 mg/dL Final  . Calcium 46/96/2952 9.4  8.4 - 10.5 mg/dL Final  . Total Protein 03/16/2013 7.2  6.0 - 8.3 g/dL Final  . Albumin 84/13/2440 3.9  3.5 - 5.2 g/dL Final  . AST 06/29/2535 17  0 - 37 U/L Final  . ALT 03/16/2013 10  0 - 35 U/L Final  . Alkaline Phosphatase 03/16/2013 71  39 - 117 U/L Final  . Total Bilirubin 03/16/2013 0.4  0.3 - 1.2 mg/dL Final  . GFR calc non Af Amer 03/16/2013 83* >90 mL/min Final  . GFR calc Af Amer 03/16/2013 >90  >90 mL/min Final   Comment:                                 The eGFR has been calculated                           using the CKD EPI equation.                          This calculation has not been                          validated in all clinical                          situations.                          eGFR's persistently                          <90 mL/min signify                          possible Chronic Kidney Disease.  [name] Prothrombin Time 03/16/2013 12.6  11.6 - 15.2 seconds Final  . INR 03/16/2013 0.96  0.00 - 1.49 Final  . ABO/RH(D)  03/16/2013 O POS   Final  . Antibody Screen 03/16/2013 NEG   Final  . Sample Expiration 03/16/2013 03/27/2013   Final  . Color, Urine 03/16/2013 YELLOW  YELLOW Final  . APPearance 03/16/2013 CLEAR  CLEAR Final  . Specific Gravity, Urine 03/16/2013 1.016  1.005 - 1.030 Final  . pH 03/16/2013 5.0  5.0 - 8.0 Final  . Glucose, UA 03/16/2013 NEGATIVE  NEGATIVE mg/dL Final  . Hgb urine dipstick 03/16/2013 NEGATIVE  NEGATIVE Final  . Bilirubin Urine 03/16/2013 MODERATE* NEGATIVE Final  . Ketones, ur 03/16/2013 NEGATIVE  NEGATIVE mg/dL Final  . Protein, ur 40/98/1191 NEGATIVE  NEGATIVE mg/dL Final  . Urobilinogen, UA 03/16/2013 0.2  0.0 - 1.0 mg/dL Final  . Nitrite 47/82/9562 NEGATIVE  NEGATIVE Final  . Leukocytes, UA 03/16/2013 NEGATIVE  NEGATIVE Final   MICROSCOPIC NOT DONE ON URINES WITH NEGATIVE PROTEIN, BLOOD, LEUKOCYTES, NITRITE, OR GLUCOSE <1000 mg/dL.  [name] MRSA, PCR 03/16/2013 NEGATIVE  NEGATIVE Final  . Staphylococcus aureus 03/16/2013 NEGATIVE  NEGATIVE Final   Comment:                                 The Xpert SA Assay (FDA                          approved for NASAL specimens                          in patients over 22 years of age),                          is one component of                          a comprehensive surveillance                          program.  Test performance has                          been validated by [employer] for patients greater                          than or equal to 62 year old.                            It is not intended                          to diagnose infection nor to                          guide or monitor treatment.  Appointment on 02/18/2013  Component Date Value Range Status  . Total Bilirubin 02/18/2013 0.7  0.3 - 1.2 mg/dL Final  . Bilirubin, Direct 02/18/2013 0.1  0.0 - 0.3 mg/dL Final  . Indirect Bilirubin 02/18/2013 0.6  0.0 - 0.9 mg/dL Final  . Alkaline Phosphatase 02/18/2013 65  39 - 117 U/L  Final  . AST 02/18/2013 17  0 - 37 U/L Final  . ALT 02/18/2013 11  0 - 35 U/L Final  . Total Protein 02/18/2013 6.1  6.0 - 8.3 g/dL Final  . Albumin 91/47/8295 3.8  3.5 - 5.2 g/dL Final  Office Visit on 02/04/2013  Component Date Value Range Status  . Color, Urine 02/04/2013 YELLOW  YELLOW Final  . APPearance 02/04/2013 CLEAR  CLEAR Final  . Specific Gravity, Urine 02/04/2013 1.018  1.005 - 1.030 Final  . pH 02/04/2013 5.0  5.0 - 8.0 Final  . Glucose, UA 02/04/2013 NEG  NEG mg/dL Final  . Bilirubin Urine 02/04/2013 LARGE* NEG Final  . Ketones, ur 02/04/2013 NEG  NEG mg/dL Final  . Hgb urine dipstick 02/04/2013 NEG  NEG Final  . Protein, ur 02/04/2013 NEG  NEG mg/dL Final  . Urobilinogen, UA 02/04/2013 0.2  0.0 - 1.0 mg/dL Final  . Nitrite 62/13/0865 NEG  NEG Final  . Leukocytes, UA 02/04/2013 NEG  NEG Final  . Squamous Epithelial / LPF 02/04/2013 FEW  RARE Final  . Crystals 02/04/2013 NONE SEEN  NONE SEEN Final  . Casts 02/04/2013 Hyaline casts noted  NONE SEEN Final  . WBC, UA 02/04/2013 3-6* <3 WBC/hpf Final   CONFIRMED BY MICROSCOPIC REVIEW  . RBC / HPF 02/04/2013 0-2  <3 RBC/hpf Final  . Bacteria, UA 02/04/2013 MANY* RARE Final  . Colony Count 02/04/2013 NO GROWTH   Final  . Organism ID, Bacteria 02/04/2013 NO GROWTH   Final     X-Rays:Dg Hip Complete Left  03/16/2013   *RADIOLOGY REPORT*  Clinical Data: Osteoarthritis left hip, preoperative evaluation for left hip surgery  LEFT HIP - COMPLETE 2+ VIEW  Comparison: Pelvic radiograph  09/18/2011  Findings: Osseous mineralization grossly normal for technique. Bilateral hip joint space narrowing and spur formation consistent with osteoarthritis. SI joints symmetric. Pelvis intact. No acute fracture, dislocation or bone destruction. Degenerative disc disease changes lower lumbar spine.  IMPRESSION: Osteoarthritic changes of bilateral hip joints. Degenerative disc disease changes lower lumbar spine.   Original Report Authenticated By: Ulyses Southward, M.D.   Dg Pelvis Portable  03/24/2013   *RADIOLOGY REPORT*  Clinical Data: Postop from the left hip arthroplasty.  PORTABLE PELVIS  Comparison: 09/18/2011  Findings: A new left hip prosthesis has its acetabular and femoral components well seated and well-aligned.  There is no acute fracture or evidence of an operative complication.  Arthropathic changes of the right hip are stable from prior study.  IMPRESSION: New left hip prosthesis is well seated and aligned.  No evidence of an operative complication.   Original Report Authenticated By: Amie Portland, M.D.   Dg C-arm 1-60 Min-no Report  03/24/2013   CLINICAL DATA: left anterior hip   C-ARM 1-60 MINUTES  Fluoroscopy was utilized by the requesting physician.  No radiographic  interpretation.     EKG: Orders placed during the hospital encounter of 03/16/13  . EKG 12-LEAD  . EKG 12-LEAD     Hospital Course: Patient was admitted to [facility] and taken to the OR and underwent the above state procedure without complications.  Patient tolerated the procedure well and was later transferred to the recovery room and then to the orthopaedic floor for postoperative care.  They were given PO and IV analgesics for pain control following their surgery.  They were given 24 hours of postoperative antibiotics of  Anti-infectives   Start     Dose/Rate Route Frequency Ordered Stop   03/24/13  1600  ceFAZolin (ANCEF) IVPB 1 g/50 mL premix     1 g 100 mL/hr over 30 Minutes Intravenous Every 6 hours  03/24/13 1457 03/24/13 2236   03/24/13 0730  ceFAZolin (ANCEF) IVPB 2 g/50 mL premix     2 g 100 mL/hr over 30 Minutes Intravenous On call to O.R. 03/24/13 1610 03/24/13 0959     and started on DVT prophylaxis in the form of Xarelto.   PT and OT were ordered for total hip protocol.  The patient was allowed to be WBAT with therapy. Discharge planning was consulted to help with postop disposition and equipment needs.  Patient had a excellent night on the evening of surgery.  They started to get up OOB with therapy on day one walking 400 feet and then 300 feet.  Hemovac drain was pulled without difficulty.  She stated that she was doing well and wanted to go home. Dr. Lequita Halt spoke with her about this and the patient as adamant about going home today. Worked with therapy twice and made sure that she met her goals and then allowed home.    Discharge Medications: Prior to Admission medications   Medication Sig Start Date End Date Taking? Authorizing Provider  zolpidem (AMBIEN) 10 MG tablet Take 1 tablet (10 mg total) by mouth at bedtime as needed. For sleep 02/04/13  Yes Dara Lords, MD  methocarbamol (ROBAXIN) 500 MG tablet Take 1 tablet (500 mg total) by mouth every 6 (six) hours as needed. 03/25/13   Faizan Geraci, PA-C  oxyCODONE (OXY IR/ROXICODONE) 5 MG immediate release tablet Take 1-2 tablets (5-10 mg total) by mouth every 3 (three) hours as needed. 03/25/13   Kaire Stary Julien Girt, PA-C  rivaroxaban (XARELTO) 10 MG TABS tablet Take 1 tablet (10 mg total) by mouth daily with breakfast. Take Xarelto for three weeks, then discontinue Xarelto. Once the patient has completed the blood thinner regimen, then take a Baby 81 mg Aspirin daily for four more weeks. 03/25/13   Newell Frater, PA-C  traMADol (ULTRAM) 50 MG tablet Take 1-2 tablets (50-100 mg total) by mouth every 6 (six) hours as needed (mild pain). 03/25/13   Derius Ghosh Julien Girt, PA-C    Diet: Cardiac  diet Activity:WBAT Follow-up:in 2 weeks Disposition - Home Discharged Condition: good   Discharge Orders   Future Orders Complete By Expires     Call MD / Call 911  As directed     Comments:      If you experience chest pain or shortness of breath, CALL 911 and be transported to the hospital emergency room.  If you develope a fever above 101 F, pus (white drainage) or increased drainage or redness at the wound, or calf pain, call your surgeon's office.    Change dressing  As directed     Comments:      You may change your dressing dressing daily with sterile 4 x 4 inch gauze dressing and paper tape.  Do not submerge the incision under water.    Constipation Prevention  As directed     Comments:      Drink plenty of fluids.  Prune juice may be helpful.  You may use a stool softener, such as Colace (over the counter) 100 mg twice a day.  Use MiraLax (over the counter) for constipation as needed.    Diet - low sodium heart healthy  As directed     Discharge instructions  As directed     Comments:  Pick up stool softner and laxative for home. Do not submerge incision under water. May shower starting Saturday Continue to use ice for pain and swelling from surgery. Total Hip Protocol.  Take Xarelto for two and a half more weeks, then discontinue Xarelto. Once the patient has completed the blood thinner regimen, then take a Baby 81 mg Aspirin daily for four more weeks.    Do not sit on low chairs, stoools or toilet seats, as it may be difficult to get up from low surfaces  As directed     Driving restrictions  As directed     Comments:      No driving until released by the physician.    Increase activity slowly as tolerated  As directed     Lifting restrictions  As directed     Comments:      No lifting until released by the physician.    Patient may shower  As directed     Comments:      You may shower without a dressing once there is no drainage.  Do not wash over the wound.  If  drainage remains, do not shower until drainage stops.    TED hose  As directed     Comments:      Use stockings (TED hose) for 3 weeks on both leg(s).  You may remove them at night for sleeping.    Weight bearing as tolerated  As directed         Medication List    STOP taking these medications       estradiol 0.025 mg/24hr  Commonly known as:  CLIMARA - Dosed in mg/24 hr     etodolac 500 MG tablet  Commonly known as:  LODINE      TAKE these medications       methocarbamol 500 MG tablet  Commonly known as:  ROBAXIN  Take 1 tablet (500 mg total) by mouth every 6 (six) hours as needed.     oxyCODONE 5 MG immediate release tablet  Commonly known as:  Oxy IR/ROXICODONE  Take 1-2 tablets (5-10 mg total) by mouth every 3 (three) hours as needed.     rivaroxaban 10 MG Tabs tablet  Commonly known as:  XARELTO  - Take 1 tablet (10 mg total) by mouth daily with breakfast. Take Xarelto for three weeks, then discontinue Xarelto.  - Once the patient has completed the blood thinner regimen, then take a Baby 81 mg Aspirin daily for four more weeks.     traMADol 50 MG tablet  Commonly known as:  ULTRAM  Take 1-2 tablets (50-100 mg total) by mouth every 6 (six) hours as needed (mild pain).     zolpidem 10 MG tablet  Commonly known as:  AMBIEN  Take 1 tablet (10 mg total) by mouth at bedtime as needed. For sleep           Follow-up Information   Follow up with Loanne Drilling, MD. Schedule an appointment as soon as possible for a visit in 2 weeks.   Contact information:   [address] [place] [address] [phone]       Signed: Patrica Duel 03/25/2013, 9:20 AM

## 2013-03-25 NOTE — Progress Notes (Signed)
Physical Therapy Treatment Patient Details Name: RENATE DANH MRN: [id number] DOB: 14-Feb-1946 Today's Date: 03/25/2013 Time: 4098-1191 PT Time Calculation (min): 15 min  PT Assessment / Plan / Recommendation  History of Present Illness     Clinical Impression    PT Comments     Follow Up Recommendations  Home health PT     Does the patient have the potential to tolerate intense rehabilitation     Barriers to Discharge        Equipment Recommendations  None recommended by PT    Recommendations for Other Services OT consult  Frequency 7X/week   Progress towards PT Goals Progress towards PT goals: Progressing toward goals  Plan Current plan remains appropriate    Precautions / Restrictions Precautions Precautions: Fall Restrictions Weight Bearing Restrictions: No LLE Weight Bearing: Weight bearing as tolerated   Pertinent Vitals/Pain Min c/o pain    Mobility  Transfers Transfers: Sit to Stand;Stand to Sit Sit to Stand: 5: Supervision;From chair/3-in-1;With armrests Stand to Sit: 5: Supervision Details for Transfer Assistance: cues for LE placement Ambulation/Gait Ambulation/Gait Assistance: 5: Supervision Ambulation Distance (Feet): 300 Feet Assistive device: 4-wheeled walker Ambulation/Gait Assistance Details: min cues for position from RW Gait Pattern: Step-to pattern;Step-through pattern Stairs: Yes Stairs Assistance: 4: Min assist Stairs Assistance Details (indicate cue type and reason): cues for sequence Stair Management Technique: No rails;Other (comment) (wall on one side and HHA on other) Number of Stairs: 8    Exercises     PT Diagnosis:    PT Problem List:   PT Treatment Interventions:     PT Goals (current goals can now be found in the care plan section) Acute Rehab PT Goals Patient Stated Goal: Play tennis again sooner rather than later PT Goal Formulation: With patient Time For Goal Achievement: 04/01/13 Potential to Achieve Goals:  Good  Visit Information  Last PT Received On: 03/25/13 Assistance Needed: +1    Subjective Data  Patient Stated Goal: Play tennis again sooner rather than later   Cognition  Cognition Arousal/Alertness: Awake/alert Behavior During Therapy: WFL for tasks assessed/performed Overall Cognitive Status: Within Functional Limits for tasks assessed    Balance     End of Session PT - End of Session Equipment Utilized During Treatment: Gait belt Activity Tolerance: Patient tolerated treatment well Patient left: in chair;with call Eckstein/phone within reach Nurse Communication: Mobility status   GP     Javious Hallisey 03/25/2013, 4:35 PM

## 2013-03-25 NOTE — Evaluation (Signed)
Occupational Therapy Evaluation Patient Details Name: EUNIQUE BALIK MRN: [id number] DOB: 05-16-1946 Today's Date: 03/25/2013 Time: 1478-2956 OT Time Calculation (min): 10 min  OT Assessment / Plan / Recommendation History of present illness     Clinical Impression   Pt was admitted for L anterior direct THA.  All education was completed.  Pt does not need any further OT at this time.      OT Assessment  Patient does not need any further OT services    Follow Up Recommendations  No OT follow up    Barriers to Discharge      Equipment Recommendations  None recommended by OT    Recommendations for Other Services    Frequency       Precautions / Restrictions Precautions Precautions: Fall Restrictions Weight Bearing Restrictions: No LLE Weight Bearing: Weight bearing as tolerated   Pertinent Vitals/Pain Stiff, L hip.  Repositioned with ice    ADL  Lower Body Bathing: Supervision/safety (with ae, ) Where Assessed - Lower Body Bathing: Supported sit to stand Lower Body Dressing: Supervision/safety (with reacher, pants ) Where Assessed - Lower Body Dressing: Supported sit to stand Toilet Transfer: Buyer, retail Method: Sit to stand Toileting - Architect and Hygiene: Supervision/safety Where Assessed - Engineer, mining and Hygiene: Sit to stand from 3-in-1 or toilet Tub/Shower Transfer: Supervision/safety Tub/Shower Transfer Method: Science writer: Walk in Scientist, research (physical sciences) Used: Reacher Transfers/Ambulation Related to ADLs: supervision level mobility.  Cued for LE for comfort ADL Comments: reinforced need to listen to body/respect pain and not push through this for adls.  Pt's husband can assist with shoes, if needed.  Pt can borrow a reacher: used for LB dressing.  Pt can perform UB adls with set up    OT Diagnosis:    OT Problem List:   OT Treatment Interventions:     OT Goals(Current  goals can be found in the care plan section) Acute Rehab OT Goals Patient Stated Goal: Play tennis again sooner rather than later  Visit Information  Last OT Received On: 03/25/13 Assistance Needed: +1       Prior Functioning     Home Living Family/patient expects to be discharged to:: Private residence Living Arrangements: Spouse/significant other Available Help at Discharge: Family Type of Home: House Home Access: Stairs to enter Secretary/administrator of Steps: 3 Entrance Stairs-Rails: None Home Layout: One level Home Equipment: Environmental consultant - 4 wheels Additional Comments: rails and seat in shower Prior Function Level of Independence: Independent Communication Communication: No difficulties Dominant Hand: Right         Vision/Perception     Cognition  Cognition Arousal/Alertness: Awake/alert Behavior During Therapy: WFL for tasks assessed/performed Overall Cognitive Status: Within Functional Limits for tasks assessed    Extremity/Trunk Assessment Upper Extremity Assessment Upper Extremity Assessment: Overall WFL for tasks assessed Lower Extremity Assessment Lower Extremity Assessment: LLE deficits/detail LLE Deficits / Details: hip strength 3-/5 with AAROM at hip to 90 flex and 20 abd     Mobility   Transfers Sit to Stand: 5: Supervision;From chair/3-in-1;With armrests Stand to Sit: 4: Min guard Details for Transfer Assistance: cues for LE placement     Exercise    Balance     End of Session OT - End of Session Activity Tolerance: Patient tolerated treatment well Patient left: in chair;with call Gane/phone within reach  GO     Lemar Bakos 03/25/2013, 1:52 PM Marica Otter, OTR/L [phone] 03/25/2013

## 2013-03-25 NOTE — Progress Notes (Signed)
   Subjective: 1 Day Post-Op Procedure(s) (LRB): LEFT TOTAL HIP ARTHROPLASTY ANTERIOR APPROACH (Left) Patient reports pain as mild.   Patient seen in rounds with Dr. Lequita Halt.  She stated that she was doing well and wanted to go home.  Dr. Lequita Halt spoke with her about this and the patient as adamant about going home today.  Will work with therapy twice today and make sure that she meets goals and then allow home.  Patient is well, and has had no acute complaints or problems Patient is ready to go home later today.  Objective: Vital signs in last 24 hours: Temp:  [95 F (35 C)-98.3 F (36.8 C)] 97.4 F (36.3 C) (07/24 0558) Pulse Rate:  [43-76] 57 (07/24 0558) Resp:  [9-17] 16 (07/24 0558) BP: (106-163)/(59-79) 121/77 mmHg (07/24 0558) SpO2:  [96 %-100 %] 97 % (07/24 0558) Weight:  [64.864 kg (143 lb)] 64.864 kg (143 lb) (07/23 1445)  Intake/Output from previous day:  Intake/Output Summary (Last 24 hours) at 03/25/13 0913 Last data filed at 03/25/13 0981  Gross per 24 hour  Intake 2728.75 ml  Output   3585 ml  Net -856.25 ml    Intake/Output this shift:    Labs:  Recent Labs  03/25/13 0443  HGB 11.0*    Recent Labs  03/25/13 0443  WBC 11.9*  RBC 3.25*  HCT 31.9*  PLT 165    Recent Labs  03/25/13 0443  NA 139  K 4.2  CL 105  CO2 28  BUN 16  CREATININE 0.81  GLUCOSE 148*  CALCIUM 8.7   No results found for this basename: LABPT, INR,  in the last 72 hours  EXAM: General - Patient is Alert, Appropriate and Oriented Extremity - Neurovascular intact Sensation intact distally Dorsiflexion/Plantar flexion intact Dressing - clean, dry, no drainage Motor Function - intact, moving foot and toes well on exam.   Assessment/Plan: 1 Day Post-Op Procedure(s) (LRB): LEFT TOTAL HIP ARTHROPLASTY ANTERIOR APPROACH (Left) Procedure(s) (LRB): LEFT TOTAL HIP ARTHROPLASTY ANTERIOR APPROACH (Left) Past Medical History  Diagnosis Date  . Arthritis   . Leg cramps     . Heart murmur    Principal Problem:   OA (osteoarthritis) of hip Active Problems:   Postoperative anemia due to acute blood loss  Estimated body mass index is 23.09 kg/(m^2) as calculated from the following:   Height as of this encounter: 5\' 6"  (1.676 m).   Weight as of this encounter: 64.864 kg (143 lb). Up with therapy Discharge home with home health Diet - Cardiac diet Follow up - in 2 weeks Activity - WBAT Disposition - Home Condition Upon Discharge - Stable D/C Meds - See DC Summary DVT Prophylaxis - Xarelto  Dustie Brittle 03/25/2013, 9:13 AM

## 2013-03-26 NOTE — Progress Notes (Signed)
Pt selected [facility] for HH needs, referral given to in house rep. 

## 2013-09-17 ENCOUNTER — Telehealth: Payer: Self-pay | Admitting: *Deleted

## 2013-09-17 NOTE — Telephone Encounter (Signed)
PA FORM FILLED OUT FOR ESTRADIOL (CLIMARA) PATCH 0.025 MG FAXED TO [employer], WILL WAIT FOR RESPONSE.

## 2013-09-20 NOTE — Telephone Encounter (Signed)
[employer] called stating medication is approved effective 09/2014-09/2014, pharmacy informed as well.

## 2014-02-08 ENCOUNTER — Ambulatory Visit (INDEPENDENT_AMBULATORY_CARE_PROVIDER_SITE_OTHER): Payer: Medicare Other | Admitting: Gynecology

## 2014-02-08 ENCOUNTER — Encounter: Payer: Self-pay | Admitting: Gynecology

## 2014-02-08 VITALS — BP 120/76 | Ht 67.0 in | Wt 143.0 lb

## 2014-02-08 DIAGNOSIS — Z79890 Hormone replacement therapy (postmenopausal): Secondary | ICD-10-CM

## 2014-02-08 DIAGNOSIS — N952 Postmenopausal atrophic vaginitis: Secondary | ICD-10-CM

## 2014-02-08 DIAGNOSIS — N951 Menopausal and female climacteric states: Secondary | ICD-10-CM

## 2014-02-08 MED ORDER — ESTRADIOL 0.025 MG/24HR TD PTTW: 1.0000 | MEDICATED_PATCH | TRANSDERMAL | Status: DC

## 2014-02-08 NOTE — Patient Instructions (Signed)
Continue on the estrogen patch as we discussed. Followup in one year for annual exam, sooner if any issues.  You may obtain a copy of any labs that were done today by logging onto MyChart as outlined in the instructions provided with your AVS (after visit summary). The office will not call with normal lab results but certainly if there are any significant abnormalities then we will contact you.   Health Maintenance, Female A healthy lifestyle and preventative care can promote health and wellness.  Maintain regular health, dental, and eye exams.  Eat a healthy diet. Foods like vegetables, fruits, whole grains, low-fat dairy products, and lean protein foods contain the nutrients you need without too many calories. Decrease your intake of foods high in solid fats, added sugars, and salt. Get information about a proper diet from your caregiver, if necessary.  Regular physical exercise is one of the most important things you can do for your health. Most adults should get at least 150 minutes of moderate-intensity exercise (any activity that increases your heart rate and causes you to sweat) each week. In addition, most adults need muscle-strengthening exercises on 2 or more days a week.   Maintain a healthy weight. The body mass index (BMI) is a screening tool to identify possible weight problems. It provides an estimate of body fat based on height and weight. Your caregiver can help determine your BMI, and can help you achieve or maintain a healthy weight. For adults 20 years and older:  A BMI below 18.5 is considered underweight.  A BMI of 18.5 to 24.9 is normal.  A BMI of 25 to 29.9 is considered overweight.  A BMI of 30 and above is considered obese.  Maintain normal blood lipids and cholesterol by exercising and minimizing your intake of saturated fat. Eat a balanced diet with plenty of fruits and vegetables. Blood tests for lipids and cholesterol should begin at age 61 and be repeated every  5 years. If your lipid or cholesterol levels are high, you are over 50, or you are a high risk for heart disease, you may need your cholesterol levels checked more frequently.Ongoing high lipid and cholesterol levels should be treated with medicines if diet and exercise are not effective.  If you smoke, find out from your caregiver how to quit. If you do not use tobacco, do not start.  Lung cancer screening is recommended for adults aged 1 80 years who are at high risk for developing lung cancer because of a history of smoking. Yearly low-dose computed tomography (CT) is recommended for people who have at least a 30-pack-year history of smoking and are a current smoker or have quit within the past 15 years. A pack year of smoking is smoking an average of 1 pack of cigarettes a day for 1 year (for example: 1 pack a day for 30 years or 2 packs a day for 15 years). Yearly screening should continue until the smoker has stopped smoking for at least 15 years. Yearly screening should also be stopped for people who develop a health problem that would prevent them from having lung cancer treatment.  If you are pregnant, do not drink alcohol. If you are breastfeeding, be very cautious about drinking alcohol. If you are not pregnant and choose to drink alcohol, do not exceed 1 drink per day. One drink is considered to be 12 ounces (355 mL) of beer, 5 ounces (148 mL) of wine, or 1.5 ounces (44 mL) of liquor.  Avoid use  of street drugs. Do not share needles with anyone. Ask for help if you need support or instructions about stopping the use of drugs.  High blood pressure causes heart disease and increases the risk of stroke. Blood pressure should be checked at least every 1 to 2 years. Ongoing high blood pressure should be treated with medicines, if weight loss and exercise are not effective.  If you are 40 to 70 years old, ask your caregiver if you should take aspirin to prevent strokes.  Diabetes screening  involves taking a blood sample to check your fasting blood sugar level. This should be done once every 3 years, after age 68, if you are within normal weight and without risk factors for diabetes. Testing should be considered at a younger age or be carried out more frequently if you are overweight and have at least 1 risk factor for diabetes.  Breast cancer screening is essential preventative care for women. You should practice "breast self-awareness." This means understanding the normal appearance and feel of your breasts and may include breast self-examination. Any changes detected, no matter how small, should be reported to a caregiver. Women in their 63s and 30s should have a clinical breast exam (CBE) by a caregiver as part of a regular health exam every 1 to 3 years. After age 14, women should have a CBE every year. Starting at age 91, women should consider having a mammogram (breast X-ray) every year. Women who have a family history of breast cancer should talk to their caregiver about genetic screening. Women at a high risk of breast cancer should talk to their caregiver about having an MRI and a mammogram every year.  Breast cancer gene (BRCA)-related cancer risk assessment is recommended for women who have family members with BRCA-related cancers. BRCA-related cancers include breast, ovarian, tubal, and peritoneal cancers. Having family members with these cancers may be associated with an increased risk for harmful changes (mutations) in the breast cancer genes BRCA1 and BRCA2. Results of the assessment will determine the need for genetic counseling and BRCA1 and BRCA2 testing.  The Pap test is a screening test for cervical cancer. Women should have a Pap test starting at age 79. Between ages 40 and 105, Pap tests should be repeated every 2 years. Beginning at age 37, you should have a Pap test every 3 years as long as the past 3 Pap tests have been normal. If you had a hysterectomy for a problem that  was not cancer or a condition that could lead to cancer, then you no longer need Pap tests. If you are between ages 96 and 21, and you have had normal Pap tests going back 10 years, you no longer need Pap tests. If you have had past treatment for cervical cancer or a condition that could lead to cancer, you need Pap tests and screening for cancer for at least 20 years after your treatment. If Pap tests have been discontinued, risk factors (such as a new sexual partner) need to be reassessed to determine if screening should be resumed. Some women have medical problems that increase the chance of getting cervical cancer. In these cases, your caregiver may recommend more frequent screening and Pap tests.  The human papillomavirus (HPV) test is an additional test that may be used for cervical cancer screening. The HPV test looks for the virus that can cause the cell changes on the cervix. The cells collected during the Pap test can be tested for HPV. The HPV test  could be used to screen women aged 61 years and older, and should be used in women of any age who have unclear Pap test results. After the age of 29, women should have HPV testing at the same frequency as a Pap test.  Colorectal cancer can be detected and often prevented. Most routine colorectal cancer screening begins at the age of 17 and continues through age 35. However, your caregiver may recommend screening at an earlier age if you have risk factors for colon cancer. On a yearly basis, your caregiver may provide home test kits to check for hidden blood in the stool. Use of a small camera at the end of a tube, to directly examine the colon (sigmoidoscopy or colonoscopy), can detect the earliest forms of colorectal cancer. Talk to your caregiver about this at age 54, when routine screening begins. Direct examination of the colon should be repeated every 5 to 10 years through age 50, unless early forms of pre-cancerous polyps or small growths are  found.  Hepatitis C blood testing is recommended for all people born from [birth year] through 1965 and any individual with known risks for hepatitis C.  Practice safe sex. Use condoms and avoid high-risk sexual practices to reduce the spread of sexually transmitted infections (STIs). Sexually active women aged 15 and younger should be checked for Chlamydia, which is a common sexually transmitted infection. Older women with new or multiple partners should also be tested for Chlamydia. Testing for other STIs is recommended if you are sexually active and at increased risk.  Osteoporosis is a disease in which the bones lose minerals and strength with aging. This can result in serious bone fractures. The risk of osteoporosis can be identified using a bone density scan. Women ages 72 and over and women at risk for fractures or osteoporosis should discuss screening with their caregivers. Ask your caregiver whether you should be taking a calcium supplement or vitamin D to reduce the rate of osteoporosis.  Menopause can be associated with physical symptoms and risks. Hormone replacement therapy is available to decrease symptoms and risks. You should talk to your caregiver about whether hormone replacement therapy is right for you.  Use sunscreen. Apply sunscreen liberally and repeatedly throughout the day. You should seek shade when your shadow is shorter than you. Protect yourself by wearing long sleeves, pants, a wide-brimmed hat, and sunglasses year round, whenever you are outdoors.  Notify your caregiver of new moles or changes in moles, especially if there is a change in shape or color. Also notify your caregiver if a mole is larger than the size of a pencil eraser.  Stay current with your immunizations. Document Released: 03/04/2011 Document Revised: 12/14/2012 Document Reviewed: 03/04/2011 [facility] Patient Information 2014 [place].

## 2014-02-08 NOTE — Progress Notes (Signed)
Brittany Shaffer 1946-06-05 [id number]        68 y.o.  [id number] for followup exam.  Several issues that are below.  Past medical history,surgical history, problem list, medications, allergies, family history and social history were all reviewed and documented as reviewed in the EPIC chart.  ROS:  12 system ROS performed with pertinent positives and negatives included in the history, assessment and plan.  Included Systems: General, HEENT, Neck, Cardiovascular, Pulmonary, Gastrointestinal, Genitourinary, Musculoskeletal, Dermatologic, Endocrine, Hematological, Neurologic, Psychiatric Additional significant findings :  None   Exam: Kim assistant Filed Vitals:   02/08/14 1502  BP: 120/76  Height: 5\' 7"  (1.702 m)  Weight: 143 lb (64.864 kg)   General appearance:  Normal affect, orientation and appearance. Skin: Grossly normal HEENT: Without gross lesions.  No cervical or supraclavicular adenopathy. Thyroid normal.  Lungs:  Clear without wheezing, rales or rhonchi Cardiac: RR, without RMG Abdominal:  Soft, nontender, without masses, guarding, rebound, organomegaly or hernia Breasts:  Examined lying and sitting without masses, retractions, discharge or axillary adenopathy. Pelvic:  Ext/BUS/vagina with generalized atrophic changes  Adnexa  Without masses or tenderness    Anus and perineum  Normal   Rectovaginal  Normal sphincter tone without palpated masses or tenderness.    Assessment/Plan:  68 y.o. [id number] female for annual exam.   1. Postmenopausal, status post TAH BSO 2013 by Dr. 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 On estradiol 0.025 mg patch doing well and wants to continue.  I again reviewed the whole issue of HRT with her to include the WHI study with increased risk of stroke, heart attack, DVT and breast cancer. The ACOG and NAMS statements for lowest dose for the shortest period of time reviewed. Transdermal versus oral first-pass effect benefit discussed. Issues of higher risk  of stroke in older individuals also discussed. Patient feels it's a quality of life issue. When she tries stopping she has acceptable hot flashes and just does not feel well. She understands and clearly accepts the risks. I refilled her x1 year. 2. DEXA 01/2013 normal. Plan repeat at 5 year interval. Increase calcium vitamin D reviewed. 3. Mammography several years ago. Need to schedule now discussed. Breast cancer most common cancer in women reviewed and benefits of early detection discussed. Patient agrees to schedule. SBE monthly reviewed. 4. Colonoscopy 2011. Repeat at their recommended interval. 5. Pap smear 2011. No Pap smear done today. No history of abnormal Pap smears. Over the age of 69. Status post hysterectomy for benign indication. We both agree to stop screening per current screening guidelines. 6. Occasional insomnia. Uses Ambien when necessary. Has supply at home but will call if she needs a refill during the year. 7. Health maintenance. No routine blood work done and she reports this done at her primary physician's office. Followup one year, sooner as needed.   Note: This document was prepared with digital dictation and possible smart phrase technology. Any transcriptional errors that result from this process are unintentional.   Anastasio Auerbach MD, 3:28 PM 02/08/2014

## 2014-02-09 LAB — URINALYSIS W MICROSCOPIC + REFLEX CULTURE
CASTS: NONE SEEN
CRYSTALS: NONE SEEN
Glucose, UA: NEGATIVE mg/dL
HGB URINE DIPSTICK: NEGATIVE
KETONES UR: NEGATIVE mg/dL
Leukocytes, UA: NEGATIVE
NITRITE: NEGATIVE
Protein, ur: NEGATIVE mg/dL
SPECIFIC GRAVITY, URINE: 1.013 (ref 1.005–1.030)
Squamous Epithelial / LPF: NONE SEEN
UROBILINOGEN UA: 0.2 mg/dL (ref 0.0–1.0)
pH: 5 (ref 5.0–8.0)

## 2014-03-18 ENCOUNTER — Other Ambulatory Visit: Payer: Self-pay | Admitting: Gynecology

## 2014-04-24 ENCOUNTER — Other Ambulatory Visit: Payer: Self-pay | Admitting: Gynecology

## 2014-07-04 ENCOUNTER — Encounter: Payer: Self-pay | Admitting: Gynecology

## 2014-10-03 ENCOUNTER — Telehealth: Payer: Self-pay | Admitting: *Deleted

## 2014-10-03 NOTE — Telephone Encounter (Signed)
Prior authorization for estradiol patch 0.025mg  filled out and faxed to [facility], will wait for response.

## 2014-10-04 ENCOUNTER — Encounter: Payer: Self-pay | Admitting: Gynecology

## 2014-10-06 NOTE — Telephone Encounter (Signed)
Patch approved until 09/02/15.

## 2014-10-20 ENCOUNTER — Ambulatory Visit (INDEPENDENT_AMBULATORY_CARE_PROVIDER_SITE_OTHER): Payer: PPO | Admitting: Sports Medicine

## 2014-10-20 ENCOUNTER — Encounter: Payer: Self-pay | Admitting: Sports Medicine

## 2014-10-20 VITALS — BP 113/81 | HR 69 | Ht 67.0 in | Wt 145.0 lb

## 2014-10-20 DIAGNOSIS — R269 Unspecified abnormalities of gait and mobility: Secondary | ICD-10-CM

## 2014-10-20 DIAGNOSIS — M19079 Primary osteoarthritis, unspecified ankle and foot: Secondary | ICD-10-CM | POA: Insufficient documentation

## 2014-10-20 DIAGNOSIS — M7742 Metatarsalgia, left foot: Secondary | ICD-10-CM

## 2014-10-20 DIAGNOSIS — M19072 Primary osteoarthritis, left ankle and foot: Secondary | ICD-10-CM

## 2014-10-20 HISTORY — DX: Primary osteoarthritis, unspecified ankle and foot: M19.079

## 2014-10-20 HISTORY — DX: Unspecified abnormalities of gait and mobility: R26.9

## 2014-10-20 HISTORY — DX: Metatarsalgia, left foot: M77.42

## 2014-10-20 NOTE — Progress Notes (Signed)
Brittany Shaffer - 69 y.o. female MRN [id number]  Date of birth: [birth year]  SUBJECTIVE:  Including CC & ROS.  Brittany Shaffer is a 69 year old avid tennis player who presents today for left midfoot pain that has been present now for several weeks. She has a history of plantar fasciitis and metatarsalgia in the past and has been fitted for custom orthotics in the office which have provided her early adequate support. Her most recent pair orthotics are over 42 years old. She describes several pains.  1. dorsal midfoot pain at the tarsal joints where she has a bony prominance and bossing and irrition from pressure in shoes 2. Patient also describes medial arch pain along the first metatarsal which she describes as a dull ache particularly with playing tennis. 3. The pain can radiate across her transverse arch as well 4. She's attempted to taper arch with kinesiotaping provide extra support and compression. This will help at times but does not completely resolve her pain. She's also attempted over-the-counter orthotics such as Dr. Felicie Morn gels which have not provided any significant relief.   ROS: Review of systems otherwise negative except for information present in HPI  HISTORY: Past Medical, Surgical, Social, and Family History Reviewed & Updated per EMR. Pertinent Historical Findings include: Nonsmoker OA No DM, CAD or HTN  DATA REVIEWED: No other image avialable  PHYSICAL EXAM:  VS: BP:113/81 mmHg  HR:69bpm  TEMP: ( )  RESP:   HT:5\' 7"  (170.2 cm)   WT:145 lb (65.772 kg)  BMI:22.8 FEET/ GAIT EXAM:  General: well nourished Skin of LE: warm; dry, no rashes, lesions, ecchymosis or erythema. Vascular: Dorsal pedal pulses 2+ bilaterally Neurologically: Sensation to light touch lower extremities equal and intact bilaterally.  Observation - no ecchymosis, no edema, or hematoma present  Palpation: mild TTP over the dorsal foot boss at the 1st cumiform-navicular joint Mild TTP at the base of the 1st  metatarsal at the 1st MTP joint No pain at the plantar aspect. Diffuse pain across the midfoot transverse arch Normal ankle motion and strength bilaterally  Extension/flexion 5/5 strength bilaterally in toes Weight-bearing foot exam:  Forefoot fat pad loss Longitudinal arch: medial arch collapse worse on left Heel position: neutral on right valgus slightly on left Leg Length discrepancy: symmetric 92cm bilateral (left Total hip) Gait analysis:  Striking location: midfoot Foot motion: pronation on left > right  MSK [place]: No stress fracture along the 1st metatarsal, bone spurring at the first MTP joint, significant bone spurring at the first cuneiform navicular joint, no signs of stress fracture of the navicular.  ASSESSMENT & PLAN: See problem based charting & AVS for pt instructions. Impression: 1. Midfoot osteoarthritic changes with bone spurring left greater than right 2. Left foot medial arch collapse related to loss of soft tissue support 3. Left foot metatarsalgia from fat pad loss in the midfoot and forefoot  Recommendations: -We'll fitted patient for new pair of custom orthotics to help  further additional support, and gait abnormalities. -Provided patient with arch strap for additional arch support -Provided a doughnut padding for her midfoot bossing to relief pressure -Patient will follow-up in 3 weeks to reassess determine if any additional changes will need to be made.  Orthotic Fitting and Adjustment note: Patient was fitted for a : standard, cushioned, semi-rigid orthotic.  The orthotic was heated and afterward the patient stood on the orthotic blank positioned on the orthotic stand.  The patient was positioned in subtalar neutral position and 10 degrees of ankle  dorsiflexion in a weight bearing stance.  After completion of molding, a stable base was applied to the orthotic blank.  The blank was ground to a stable position for weight bearing.  Size: 9 Base: Blue  EVA Posting: none Additional orthotic padding: metatarsal bar  Greater than 50% of the patient's visit for a total of 30 minutes, was spent conducting face-to-face counseling for bilateral foot orthotics fitting and constructing

## 2015-02-20 ENCOUNTER — Ambulatory Visit (INDEPENDENT_AMBULATORY_CARE_PROVIDER_SITE_OTHER): Payer: PPO | Admitting: Gynecology

## 2015-02-20 ENCOUNTER — Encounter: Payer: Self-pay | Admitting: Gynecology

## 2015-02-20 VITALS — BP 122/76 | Ht 67.5 in | Wt 146.0 lb

## 2015-02-20 DIAGNOSIS — Z79890 Hormone replacement therapy (postmenopausal): Secondary | ICD-10-CM

## 2015-02-20 DIAGNOSIS — G4700 Insomnia, unspecified: Secondary | ICD-10-CM | POA: Diagnosis not present

## 2015-02-20 DIAGNOSIS — N952 Postmenopausal atrophic vaginitis: Secondary | ICD-10-CM | POA: Diagnosis not present

## 2015-02-20 DIAGNOSIS — Z01419 Encounter for gynecological examination (general) (routine) without abnormal findings: Secondary | ICD-10-CM

## 2015-02-20 MED ORDER — ESTRADIOL 0.025 MG/24HR TD PTWK: MEDICATED_PATCH | TRANSDERMAL | Status: DC

## 2015-02-20 MED ORDER — ZOLPIDEM TARTRATE 10 MG PO TABS: 10.0000 mg | ORAL_TABLET | Freq: Every evening | ORAL | Status: DC | PRN

## 2015-02-20 NOTE — Progress Notes (Signed)
Brittany Shaffer [birth year] [id number]        69 y.o.  [id number] for breast and pelvic exam. Several issues noted below.  Past medical history,surgical history, problem list, medications, allergies, family history and social history were all reviewed and documented as reviewed in the EPIC chart.  ROS:  Performed with pertinent positives and negatives included in the history, assessment and plan.   Additional significant findings :  none   Exam: Kim Counsellor Vitals:   02/20/15 1552  BP: 122/76  Height: 5' 7.5" (1.715 m)  Weight: 146 lb (66.225 kg)   General appearance:  Normal affect, orientation and appearance. Skin: Grossly normal HEENT: Without gross lesions.  No cervical or supraclavicular adenopathy. Thyroid normal.  Lungs:  Clear without wheezing, rales or rhonchi Cardiac: RR, without RMG Abdominal:  Soft, nontender, without masses, guarding, rebound, organomegaly or hernia Breasts:  Examined lying and sitting. Left without masses, retractions, discharge or axillary adenopathy.  Right tail of Spence with increased density. No cleared palpable masses but denser than the left side. No retractions discharge or axillary adenopathy. Pelvic:  Ext/BUS/vagina with atrophic changes  Adnexa  Without masses or tenderness    Anus and perineum  Normal   Rectovaginal  Normal sphincter tone without palpated masses or tenderness.    Assessment/Plan:  68 y.o. [id number] female for breast and pelvic exam.   1. Increased density right tell of Spence. Not appreciated previously. Recent mammogram 10/2014 normal. We'll check baseline ultrasound and diagnostic mammogram of this area for reassurance. Patient understands she'll be contacted to arrange this and will call me if she does not hear within the next 1-2 weeks. Assuming negative them plan expectant management. 2. HRT. Patient continues on Climara 0.025 mg patch.  States that she just feels better on this. Has tried stopping it and didn't like the  way she felt overall. I reviewed the issues of using the patch into the 70s. Increased risk of stroke heart attack DVT and possible breast cancer. Patient's comfortable with these risks and wants to continue and I refilled her 1 year. 3. Insomnia. Patient occasionally uses Ambien to help with sleep. Breaks the 10 mg in half. #30 with 1 refill provided. 4. Postmenopausal/atrophic genital changes. No significant hot flushes, night sweats, vaginal dryness. Status post TAH/BSO for fibrothecoma 2013. 5. DEXA 2014 normal. Plan to repeat at 5 year interval. Increased calcium vitamin D reviewed. 6. Pap smear 2011. No Pap smear done today. Were both comfortable stop screening is she's never had an abnormal Pap smear and is status post hysterectomy for benign indications as well as over the age of 45. 50. Colonoscopy 2011. Repeat at their recommended interval. 8. Health maintenance. No routine blood work done as patient does this through her primary physician. Follow up 1 year, sooner as needed.     Anastasio Auerbach MD, 4:14 PM 02/20/2015

## 2015-02-20 NOTE — Patient Instructions (Signed)
Follow up for the breast ultrasound and mammogram as arranged. Call if you do not hear from their office to schedule this within 1-2 weeks

## 2015-02-21 ENCOUNTER — Telehealth: Payer: Self-pay | Admitting: *Deleted

## 2015-02-21 LAB — URINALYSIS W MICROSCOPIC + REFLEX CULTURE
CASTS: NONE SEEN
CRYSTALS: NONE SEEN
GLUCOSE, UA: NEGATIVE mg/dL
Hgb urine dipstick: NEGATIVE
Ketones, ur: NEGATIVE mg/dL
LEUKOCYTES UA: NEGATIVE
Nitrite: NEGATIVE
PH: 5 (ref 5.0–8.0)
Protein, ur: NEGATIVE mg/dL
SQUAMOUS EPITHELIAL / LPF: NONE SEEN
Specific Gravity, Urine: 1.018 (ref 1.005–1.030)
Urobilinogen, UA: 0.2 mg/dL (ref 0.0–1.0)

## 2015-02-21 NOTE — Telephone Encounter (Signed)
Appointment on 03/01/15 @ 9:30am at [facility] order faxed, left message for pt to call.

## 2015-02-21 NOTE — Telephone Encounter (Signed)
Pt informed

## 2015-02-21 NOTE — Telephone Encounter (Signed)
-----   Message from Anastasio Auerbach, MD sent at 02/20/2015  4:18 PM EDT ----- Schedule ultrasound and diagnostic mammography at [facility] reference increased density right tail of Spence not noticed previously.

## 2015-02-28 ENCOUNTER — Encounter: Payer: Self-pay | Admitting: Gynecology

## 2015-03-15 ENCOUNTER — Encounter: Payer: Self-pay | Admitting: Sports Medicine

## 2015-03-15 ENCOUNTER — Ambulatory Visit (INDEPENDENT_AMBULATORY_CARE_PROVIDER_SITE_OTHER): Payer: PPO | Admitting: Sports Medicine

## 2015-03-15 VITALS — BP 149/79 | Ht 67.0 in | Wt 145.0 lb

## 2015-03-15 DIAGNOSIS — S76312A Strain of muscle, fascia and tendon of the posterior muscle group at thigh level, left thigh, initial encounter: Secondary | ICD-10-CM

## 2015-03-15 DIAGNOSIS — M6289 Other specified disorders of muscle: Secondary | ICD-10-CM

## 2015-03-15 DIAGNOSIS — M629 Disorder of muscle, unspecified: Secondary | ICD-10-CM | POA: Diagnosis not present

## 2015-03-15 NOTE — Progress Notes (Signed)
  BRYER GOTTSCH - 69 y.o. female MRN [id number]  Date of birth: [birth year]  CC: Left Hamstring Pina  SUBJECTIVE:   HPI  Mrs. Cordrey and extremely pleasant 69 year old comes in with 1 month persistent left hamstring pain. She has a history of hip replacement on the left side 2 years ago. Prior to the injury she had taken 2 weeks off from tenderness and upon resuming felt a twinge during her first game back. She now has recurrent pain accelerating the net to field drop shots. She never experienced any significant bruising or overt weakness. She does have some pain on the right side as well although this is much more mild. She has tried ice and massage and with a tennis ball both of which provide minimal to no relief. She does try stretching prior to exercise on most occasions.   ROS:     Negative besides that listed in the history of present illness.  HISTORY: Past Medical, Surgical, Social, and Family History Reviewed & Updated per EMR.  Pertinent Historical Findings include: As above she had a left hip replacement 2 years ago  OBJECTIVE: BP 149/79 mmHg  Ht 5\' 7"  (1.702 m)  Wt 145 lb (65.772 kg)  BMI 22.71 kg/m2  Physical Exam  No acute distress sitting comfortably on the exam table.  Respiratory: Nonlabored breathing. MSK:  - Back: She has minimal if any lumbar lordosis. Curvature of the lumbar spine to the right. Hip flexion limited by hamstring tightness. Lumbar spine rotation within normal limits. Extension significantly limited especially in the lumbar spine. Lateral flexion significantly limited bilaterally in the lumbar spine. - Lower extremity: No visible bruising overlying the proximal posterior leg. Mild tenderness over the site of proximal hamstring insertion on the ischial tuberosity.   Full passive range of motion of hamstring. H-test reveals hamstring tightness on the left compared to the right (80 versus 90). 5/5 hip abductor strength, symmetric. Knee flexion 5 out of 5 with minimal  discomfort. Extension 5 out of 5 strength. Patellar and achilles reflexes 2+ Neurovascularly intact.  LABS & OTHER ORDERS: Previous Medications   ESTRADIOL (CLIMARA - DOSED IN MG/24 HR) 0.025 MG/24HR PATCH    PLACE 1 PATCH (0.025 MG TOTAL) ONTO THE SKIN ONCE A WEEK.   ETODOLAC (LODINE) 500 MG TABLET    Take 500 mg by mouth daily.   HOMEOPATHIC PRODUCTS (LEG CRAMP RELIEF PO)    Take by mouth.   ZOLPIDEM (AMBIEN) 10 MG TABLET    Take 1 tablet (10 mg total) by mouth at bedtime as needed. For sleep   Modified Medications   No medications on file   New Prescriptions   No medications on file   Discontinued Medications   No medications on file  No orders of the defined types were placed in this encounter.   ASSESSMENT & PLAN: See problem based charting & AVS for pt instructions.

## 2015-05-02 ENCOUNTER — Ambulatory Visit: Payer: PPO | Admitting: Sports Medicine

## 2015-06-06 ENCOUNTER — Encounter: Payer: Self-pay | Admitting: Internal Medicine

## 2015-10-26 ENCOUNTER — Encounter: Payer: Self-pay | Admitting: Sports Medicine

## 2015-10-26 ENCOUNTER — Ambulatory Visit (INDEPENDENT_AMBULATORY_CARE_PROVIDER_SITE_OTHER): Payer: PPO | Admitting: Sports Medicine

## 2015-10-26 VITALS — BP 142/89 | HR 65 | Ht 67.0 in | Wt 145.0 lb

## 2015-10-26 DIAGNOSIS — M1712 Unilateral primary osteoarthritis, left knee: Secondary | ICD-10-CM | POA: Diagnosis not present

## 2015-10-26 MED ORDER — TRAMADOL HCL 50 MG PO TABS: 50.0000 mg | ORAL_TABLET | Freq: Four times a day (QID) | ORAL | Status: DC | PRN

## 2015-10-26 NOTE — Progress Notes (Signed)
Patient ID: Brittany Shaffer, female   DOB: May 03, 1946, 70 y.o.   MRN: [id number]   BP 142/89 mmHg  Pulse 65  Ht 5\' 7"  (1.702 m)  Wt 145 lb (65.772 kg)  BMI 22.71 kg/m2   CC: Patient complains of right and left knee pain  Right knee pain more proximal Ant knee pain that has now resolved Unsure if hurt during tennis  Left knee pain lateral and swelling on back of knee stiff and painful with walking her dog and playing tennis Hurt this moving for a ball in tennis  Leg cramps at night   Uses several different knee braces; body helix and mcdavid strap  Past HX S/P lt hip replacement HS strain  Soc hx; never smoked/ lives with husband who has mild dementia  ROS No locking No giving way No generalized joint swelling Varicose vv in legs sometimes hurt  Exam: nad O x 3 BP 142/89 mmHg  Pulse 65  Ht 5\' 7"  (1.702 m)  Wt 145 lb (65.772 kg)  BMI 22.71 kg/m2    Left:  Swollen with effusion superiorly warm to touch straighten to - 8 , flex to 140 Stable ligaments Mild pain on Mcmurry or on 1 leg squat on the outside of knee  Right No effusion Right: straighten to -3 and flexion is 150 deg Stable ligaments Neg McMurray No pain on 1 leg squat  [place] both knees  LT  Mod large effusion degnerative lat meniscus Mild OA with spurs Lat meniscal cyst Pt/QT nl Med men nl  RT No effusion Med and lat men nl PT/QT nl

## 2015-10-26 NOTE — Assessment & Plan Note (Signed)
Easy exercise regimen She can use any of the supports that are comfortable Add some tramadol as needed for pain  Wants to participate in knee study and we will enroll her  Trigger for acute sxs is likely worsening of deg meniscus left lat. knee

## 2015-11-20 DIAGNOSIS — M1611 Unilateral primary osteoarthritis, right hip: Secondary | ICD-10-CM | POA: Diagnosis not present

## 2015-11-20 DIAGNOSIS — M1711 Unilateral primary osteoarthritis, right knee: Secondary | ICD-10-CM | POA: Diagnosis not present

## 2015-11-20 DIAGNOSIS — M1712 Unilateral primary osteoarthritis, left knee: Secondary | ICD-10-CM | POA: Diagnosis not present

## 2015-11-20 DIAGNOSIS — M170 Bilateral primary osteoarthritis of knee: Secondary | ICD-10-CM | POA: Diagnosis not present

## 2015-11-20 DIAGNOSIS — Z96642 Presence of left artificial hip joint: Secondary | ICD-10-CM | POA: Diagnosis not present

## 2015-11-20 DIAGNOSIS — Z471 Aftercare following joint replacement surgery: Secondary | ICD-10-CM | POA: Diagnosis not present

## 2015-12-07 DIAGNOSIS — L570 Actinic keratosis: Secondary | ICD-10-CM | POA: Diagnosis not present

## 2015-12-07 DIAGNOSIS — B078 Other viral warts: Secondary | ICD-10-CM | POA: Diagnosis not present

## 2015-12-07 DIAGNOSIS — I8392 Asymptomatic varicose veins of left lower extremity: Secondary | ICD-10-CM | POA: Diagnosis not present

## 2015-12-07 DIAGNOSIS — L821 Other seborrheic keratosis: Secondary | ICD-10-CM | POA: Diagnosis not present

## 2015-12-07 DIAGNOSIS — D1801 Hemangioma of skin and subcutaneous tissue: Secondary | ICD-10-CM | POA: Diagnosis not present

## 2015-12-07 DIAGNOSIS — L814 Other melanin hyperpigmentation: Secondary | ICD-10-CM | POA: Diagnosis not present

## 2015-12-07 DIAGNOSIS — I8391 Asymptomatic varicose veins of right lower extremity: Secondary | ICD-10-CM | POA: Diagnosis not present

## 2016-02-06 ENCOUNTER — Encounter: Payer: Self-pay | Admitting: Family Medicine

## 2016-02-06 ENCOUNTER — Ambulatory Visit (INDEPENDENT_AMBULATORY_CARE_PROVIDER_SITE_OTHER): Payer: PPO | Admitting: Family Medicine

## 2016-02-06 VITALS — BP 150/88 | Ht 68.0 in | Wt 147.0 lb

## 2016-02-06 DIAGNOSIS — M79671 Pain in right foot: Secondary | ICD-10-CM | POA: Diagnosis not present

## 2016-02-07 ENCOUNTER — Encounter: Payer: Self-pay | Admitting: Family Medicine

## 2016-02-07 NOTE — Progress Notes (Signed)
Brittany Shaffer - 70 y.o. 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 She has a previous history of plantar fasciitis, pes planus, and advancedMorton's changes bilaterally.  She has orthotics which she wears. She suspects her foot pain is plantar fasciitis. She is stretched minimally over the last month. She is not had an injection before. She takes etodolac which helps somewhat. She has continued to be able to play tennis of the pain. The pain is worse in the morning when she is not wearing any support. She is not required any injections in the past. She is otherwise feeling well. Unfortunately today while playing tennis she felt a pop. She has very minimal pain without weight bearing but any weightbearing causes her significant discomfort just distal to the plantar fascial insertion. No swelling or bruising identified. Prolonged icing does seem to help the pain.  ROS:     As above, no fevers chills or night sweats. No new joint swelling or new rashes. No unexplained weight loss.  HISTORY: Past Medical, Surgical, Social, and Family History Reviewed & Updated per EMR.  Pertinent Historical Findings include: Very active tennis player. OA of the hip and knee. History of plantar fasciitis. Metatarsalgia more prominent on the left, acquired pes planus  OBJECTIVE: BP 150/88 mmHg  Ht 5\' 8"  (1.727 m)  Wt 147 lb (66.679 kg)  BMI 22.36 kg/m2  Physical Exam  Calm, no acute distress Nonlabored breathing  FEET/ GAIT EXAM:  General: well nourished Skin of LE: warm; dry, no rashes, lesions, ecchymosis or erythema. Vascular: Dorsal pedal pulses 2+ bilaterally Neurologically: Sensation to light touch lower extremities equal and intact bilaterally.   Observation - no ecchymosis, no edema, or hematoma present  Palpation:  Tenderness to palpation over the plantar fascial insertion on the right and just distal with firm pressure. No pain at the plantar aspect.  Normal ankle motion and strength bilaterally  Extension/flexion 5/5 strength bilaterally in toes No pain with passive dorsiflexion of the great toe on the right. Weight-bearing foot exam:  Forefoot fat pad loss bilaterally, left greater than right Longitudinal arch: advanced medial arch collapse worse on left>right  Heel position: neutral on right,  valgus slightly on left Negative calcaneal squeeze   MSK [place]: Long and short axis views of the right plantar fascia shows thickening of the insertion, ~0.7cm compared to more distally where it is~0.40. Deep to the plantar fascia within the intrinsic foot muscles there is significantly increased blood flow and slow movement consistent with a hematoma. Overall this ultrasound would be consistent with chronic plantar fasciitis with a acute partial intrinsic foot muscle rupture.  MEDICATIONS, LABS & OTHER ORDERS: Previous Medications   ESTRADIOL (CLIMARA - DOSED IN MG/24 HR) 0.025 MG/24HR PATCH    PLACE 1 PATCH (0.025 MG TOTAL) ONTO THE SKIN ONCE A WEEK.   ETODOLAC (LODINE) 500 MG TABLET    Take 500 mg by mouth daily.   HOMEOPATHIC PRODUCTS (LEG CRAMP RELIEF PO)    Take by mouth.   TRAMADOL (ULTRAM) 50 MG TABLET    Take 1 tablet (50 mg total) by mouth every 6 (six) hours as needed.   ZOLPIDEM (AMBIEN) 10 MG TABLET    Take 1 tablet (10 mg total) by mouth at bedtime as needed. For sleep   Modified Medications   No medications  on file   New Prescriptions   No medications on file   Discontinued Medications   No medications on file  No orders of the defined types were placed in this encounter.   ASSESSMENT & PLAN: Chronic plantar fasciitis with an acute intrinsic foot muscle tear: Unfortunately Brittany Shaffer will need to take some time off from tenderness. She is able to ambulate with minimal discomfort although  I doubt she'll be able to play without a limp. This should be her barometer as to when she can return to play. We did add metatarsal pads to her orthotics as she appeared to be wearing down in the region of her third metatarsal on the left. We also added 3/16 inch heel lifts to help with her plantar fasciitis. Also discussed stretching and icing as well as deep massage. We believe this acute exacerbation will heal within a week or 2 however the plantar fasciitis she was experiencing prior to today's injury may linger. If she does not feel substantially better in 2 or 3 weeks we can consider an injection. Continue Lodine as well as Tylenol if needed. Call with any questions or concerns.

## 2016-02-21 ENCOUNTER — Encounter: Payer: Self-pay | Admitting: Gynecology

## 2016-02-21 ENCOUNTER — Ambulatory Visit (INDEPENDENT_AMBULATORY_CARE_PROVIDER_SITE_OTHER): Payer: PPO | Admitting: Gynecology

## 2016-02-21 VITALS — BP 118/78 | Ht 67.0 in | Wt 147.0 lb

## 2016-02-21 DIAGNOSIS — Z79890 Hormone replacement therapy (postmenopausal): Secondary | ICD-10-CM

## 2016-02-21 DIAGNOSIS — Z01419 Encounter for gynecological examination (general) (routine) without abnormal findings: Secondary | ICD-10-CM

## 2016-02-21 DIAGNOSIS — N952 Postmenopausal atrophic vaginitis: Secondary | ICD-10-CM

## 2016-02-21 DIAGNOSIS — G4700 Insomnia, unspecified: Secondary | ICD-10-CM | POA: Diagnosis not present

## 2016-02-21 MED ORDER — ZOLPIDEM TARTRATE 10 MG PO TABS: 10.0000 mg | ORAL_TABLET | Freq: Every evening | ORAL | Status: DC | PRN

## 2016-02-21 MED ORDER — ESTRADIOL 0.025 MG/24HR TD PTWK: MEDICATED_PATCH | TRANSDERMAL | Status: DC

## 2016-02-21 NOTE — Patient Instructions (Signed)

## 2016-02-21 NOTE — Progress Notes (Signed)
    Brittany Shaffer [birth year] [id number]        70 y.o.  [id number]  for breast and pelvic exam.  Past medical history,surgical history, problem list, medications, allergies, family history and social history were all reviewed and documented as reviewed in the EPIC chart.  ROS:  Performed with pertinent positives and negatives included in the history, assessment and plan.   Additional significant findings :  None   Exam: Caryn Bee assistant Filed Vitals:   02/21/16 1557  BP: 118/78  Height: 5\' 7"  (1.702 m)  Weight: 147 lb (66.679 kg)   General appearance:  Normal affect, orientation and appearance. Skin: Grossly normal HEENT: Without gross lesions.  No cervical or supraclavicular adenopathy. Thyroid normal.  Lungs:  Clear without wheezing, rales or rhonchi Cardiac: RR, without RMG Abdominal:  Soft, nontender, without masses, guarding, rebound, organomegaly or hernia Breasts:  Examined lying and sitting without masses, retractions, discharge or axillary adenopathy.  Breast tissue more prominent in the right tail of Spence than left. No palpable masses or other abnormalities Pelvic:  Ext/BUS/vagina With atrophic changes  Adnexa without masses or tenderness    Anus and perineum normal   Rectovaginal normal sphincter tone without palpated masses or tenderness.    Assessment/Plan:  70 y.o. [id number] female for breast and pelvic exam.   1. Postmenopausal/atrophic changes/HRT. Patient continues on Climara 0.025 mg patch. Has tried stopping but does not feel as good. She feels this is a quality-of-life issue and prefers to stay on her ERT. Status post TAH/BSO in the past for fibrothecoma. Risks of HRT particularly with aging to include increased risk of thrombosis such as stroke heart attack DVT possible breast cancer reviewed. Patient understands and accepts these risks and I refilled her 1 year. 2. Mammography coming due. Had diagnostic mammography and ultrasound of the right breast last  year due to increased density of the right tail of Spence noted on exam. Both studies were negative. Her exam remains unchanged since last year consistent with physiologic changes. Recommended continue with annual mammography's. SBE monthly reviewed. 3. Colonoscopy 2011 with reported recommended repeat interval 10 years. 4. Pap smear 2011. No Pap smear done today. We again reviewed current screening guidelines and both agree to stop screening as she is over the age of 55, status post hysterectomy for benign indications and no history of significant abnormal Pap smears. 5. DEXA 2014 normal. Plan repeat at 5 year interval. Increased calcium and vitamin D. 6. Insomnia. Patient uses half of 10 mg Ambien as needed for occasional insomnia. Discussed alternative OTC products. Ambien 10 mg #30 with 1 refill provided. 7. Health maintenance. No routine lab work done as this is done elsewhere. Follow up in one year, sooner as needed.  Greater than 10 minutes of my time in excess of her breast and pelvic exam was spent in direct face to face counseling and coordination of care in regards to her problems of insomnia, HRT.  Anastasio Auerbach MD, 4:25 PM 02/21/2016

## 2016-02-29 ENCOUNTER — Other Ambulatory Visit: Payer: Self-pay | Admitting: Gynecology

## 2016-03-11 ENCOUNTER — Telehealth: Payer: Self-pay | Admitting: *Deleted

## 2016-03-11 DIAGNOSIS — M722 Plantar fascial fibromatosis: Secondary | ICD-10-CM | POA: Diagnosis not present

## 2016-03-11 DIAGNOSIS — M79671 Pain in right foot: Secondary | ICD-10-CM | POA: Diagnosis not present

## 2016-03-11 NOTE — Telephone Encounter (Signed)
Prior Authorization for Zolpidem tartrate 10 mg tablet approved until 09/01/16.

## 2016-04-29 DIAGNOSIS — Z1231 Encounter for screening mammogram for malignant neoplasm of breast: Secondary | ICD-10-CM | POA: Diagnosis not present

## 2016-07-31 DIAGNOSIS — I87323 Chronic venous hypertension (idiopathic) with inflammation of bilateral lower extremity: Secondary | ICD-10-CM | POA: Diagnosis not present

## 2016-08-07 ENCOUNTER — Other Ambulatory Visit: Payer: Self-pay

## 2016-08-07 DIAGNOSIS — R609 Edema, unspecified: Secondary | ICD-10-CM

## 2016-08-23 ENCOUNTER — Encounter: Payer: Self-pay | Admitting: Vascular Surgery

## 2016-09-06 DIAGNOSIS — Z131 Encounter for screening for diabetes mellitus: Secondary | ICD-10-CM | POA: Diagnosis not present

## 2016-09-06 DIAGNOSIS — E7800 Pure hypercholesterolemia, unspecified: Secondary | ICD-10-CM | POA: Diagnosis not present

## 2016-09-10 ENCOUNTER — Ambulatory Visit (INDEPENDENT_AMBULATORY_CARE_PROVIDER_SITE_OTHER): Payer: PPO | Admitting: Vascular Surgery

## 2016-09-10 ENCOUNTER — Encounter: Payer: Self-pay | Admitting: Vascular Surgery

## 2016-09-10 ENCOUNTER — Ambulatory Visit (HOSPITAL_COMMUNITY)
Admission: RE | Admit: 2016-09-10 | Discharge: 2016-09-10 | Disposition: A | Discharge status: Home / Self Care | Payer: PPO | Source: Ambulatory Visit | Attending: Vascular Surgery | Admitting: Vascular Surgery

## 2016-09-10 VITALS — BP 154/89 | HR 52 | Temp 97.0°F | Resp 20 | Ht 67.0 in | Wt 153.0 lb

## 2016-09-10 DIAGNOSIS — R609 Edema, unspecified: Secondary | ICD-10-CM | POA: Insufficient documentation

## 2016-09-10 DIAGNOSIS — I83893 Varicose veins of bilateral lower extremities with other complications: Secondary | ICD-10-CM

## 2016-09-10 DIAGNOSIS — Z0000 Encounter for general adult medical examination without abnormal findings: Secondary | ICD-10-CM | POA: Diagnosis not present

## 2016-09-10 DIAGNOSIS — I872 Venous insufficiency (chronic) (peripheral): Secondary | ICD-10-CM | POA: Insufficient documentation

## 2016-09-10 DIAGNOSIS — Z79899 Other long term (current) drug therapy: Secondary | ICD-10-CM | POA: Diagnosis not present

## 2016-09-10 HISTORY — DX: Varicose veins of bilateral lower extremities with other complications: I83.893

## 2016-09-10 NOTE — Progress Notes (Signed)
Subjective:     Patient ID: Brittany Shaffer, female   DOB: 12/20/1945, 71 y.o.   MRN: [id number]  HPI This 71 year old female is evaluated for bilateral painful varicosities. She has noted bulging varicosities in the left anterior thigh and lateral calf as well as the right anterior thigh and pretibial region over the past several years. She develops heavy aching discomfort as the day progresses and some mild edema. She also has osteoarthritis and has had left hip replacement by Dr. Maureen Ralphs. She is followed for arthritis in her left knee and right hip as well. She has no history of DVT thrombophlebitis stasis ulcers or bleeding. She does not really elastic compression stockings on a regular basis. Her symptoms worsen as the day progresses the longer she is on her feet particularly the aching discomfort in the thigh areas.  Past Medical History:  Diagnosis Date  . Arthritis   . Atrial fibrillation ([place])   . Heart murmur   . Leg cramps   . Murmur, heart     Social History  Substance Use Topics  . Smoking status: Never Smoker  . Smokeless tobacco: Never Used  . Alcohol use 4.2 oz/week    7 Standard drinks or equivalent per week     Comment: 1 / PER DAY    Family History  Problem Relation Age of Onset  . Hypertension Mother   . Bone cancer Father   . Lung cancer Father   . Ovarian cancer Sister   . Diabetes Maternal Aunt   . Breast cancer Maternal Aunt     Age 62's    Allergies  Allergen Reactions  . Codeine     Hallucinations     Current Outpatient Prescriptions:  .  estradiol (CLIMARA - DOSED IN MG/24 HR) 0.025 mg/24hr patch, PLACE 1 PATCH (0.025 MG TOTAL) ONTO THE SKIN ONCE A WEEK., Disp: 4 patch, Rfl: 11 .  etodolac (LODINE) 500 MG tablet, Take 500 mg by mouth daily., Disp: , Rfl:  .  Homeopathic Products (LEG CRAMP RELIEF PO), Take by mouth., Disp: , Rfl:  .  zolpidem (AMBIEN) 10 MG tablet, Take 1 tablet (10 mg total) by mouth at bedtime as needed. For sleep, Disp: 30  tablet, Rfl: 1 .  estradiol (CLIMARA - DOSED IN MG/24 HR) 0.025 mg/24hr patch, PLACE 1 PATCH (0.025 MG TOTAL) ONTO THE SKIN ONCE A WEEK. (Patient not taking: Reported on 09/10/2016), Disp: 4 patch, Rfl: 11  Vitals:   09/10/16 1121 09/10/16 1125  BP: (!) 159/89 (!) 154/89  Pulse: (!) 52   Resp: 20   Temp: 97 F (36.1 C)   TempSrc: Oral   SpO2: 100%   Weight: 153 lb (69.4 kg)   Height: 5\' 7"  (1.702 m)     Body mass index is 23.96 kg/m.         Review of Systems Denies chest pain, dyspnea on exertion, PND, orthopnea. Has history of heart murmur but not A. fib. Has osteoarthritis with previous left hip replacement. Other systems negative and complete review of systems    Objective:   Physical Exam BP (!) 154/89 (BP Location: Left Arm) Comment: recheck  Pulse (!) 52   Temp 97 F (36.1 C) (Oral)   Resp 20   Ht 5\' 7"  (1.702 m)   Wt 153 lb (69.4 kg)   SpO2 100%   BMI 23.96 kg/m     Gen.-alert and oriented x3 in no apparent distress HEENT normal for age Lungs no rhonchi or  wheezing Cardiovascular regular rhythm no murmurs carotid pulses 3+ palpable no bruits audible Abdomen soft nontender no palpable masses Musculoskeletal free of  major deformities Skin clear -no rashes Neurologic normal Lower extremities 3+ femoral and dorsalis pedis pulses palpable bilaterally with 1+ edema bilaterally Left leg with bulging varicosities beginning middle third thigh anteriorly extending distally and laterally lateral to the knee and into the lateral calf area. No hyperpigmentation or ulceration noted.  Right leg has bulging varicosities in anterior thigh and right pretibial region extending down to ankle level. No active ulcer noted.  Today I ordered bilateral venous duplex exam which I reviewed and interpreted. There is gross reflux in bilateral great saphenous veins supplying these painful varicosities with no DVT .       Assessment:     Bilateral painful varicosities due to  bilateral gross reflux great saphenous veins causing symptoms which are affecting patient's daily living Osteoarthritis left knee and status post left hip replacement History of heart murmur    Plan:         #1 long leg elastic compression stockings 20-30 mm gradient #2 elevate legs as much as possible #3 ibuprofen daily on a regular basis for pain #4 return in 3 months-if no significant improvement then she will need #1 laser ablation left great saphenous vein followed by #2 laser ablation right great saphenous vein. She would then return in 3 months to be evaluated for possible stab phlebectomy of painful varicosities Return in 3 months

## 2016-09-12 DIAGNOSIS — M1712 Unilateral primary osteoarthritis, left knee: Secondary | ICD-10-CM | POA: Diagnosis not present

## 2016-09-12 DIAGNOSIS — M1611 Unilateral primary osteoarthritis, right hip: Secondary | ICD-10-CM | POA: Diagnosis not present

## 2016-09-12 DIAGNOSIS — Z471 Aftercare following joint replacement surgery: Secondary | ICD-10-CM | POA: Diagnosis not present

## 2016-09-12 DIAGNOSIS — Z96642 Presence of left artificial hip joint: Secondary | ICD-10-CM | POA: Diagnosis not present

## 2016-10-07 ENCOUNTER — Ambulatory Visit (INDEPENDENT_AMBULATORY_CARE_PROVIDER_SITE_OTHER): Payer: PPO | Admitting: Sports Medicine

## 2016-10-07 ENCOUNTER — Encounter: Payer: Self-pay | Admitting: Sports Medicine

## 2016-10-07 VITALS — BP 158/84 | HR 59 | Ht 67.0 in | Wt 150.0 lb

## 2016-10-07 DIAGNOSIS — M25562 Pain in left knee: Secondary | ICD-10-CM | POA: Diagnosis not present

## 2016-10-07 DIAGNOSIS — G8929 Other chronic pain: Secondary | ICD-10-CM | POA: Diagnosis not present

## 2016-10-07 MED ORDER — DICLOFENAC SODIUM 1 % TD GEL: 2.0000 g | Freq: Three times a day (TID) | TRANSDERMAL | 1 refills | Status: DC | PRN

## 2016-10-07 NOTE — Progress Notes (Signed)
   Subjective:    Patient ID: Brittany Shaffer, female    DOB: 1945-09-10, 71 y.o.   MRN: [id number]  HPI chief complaint: Left knee pain  Very pleasant 71 year old female comes in today complaining of 4-5 months of posterior left knee pain. She was seen in our office about a year ago with a similar complaint. Since that time, her pain has been consistently present first thing in the morning as well as with first standing up. She localizes her pain just proximal to the popliteal fossa. She has not noticed any swelling. She does note stiffness and an inability to completely flex her knee from time to time. No locking of the knee. She takes etodolac for right hip osteoarthritis and it does seem to help her knee as well. She also has a compression sleeve which seems to be helpful. Recent x-rays of the left knee were done at a local orthopedist office and showed some mild arthritis per her report. She denies any numbness or tingling. No known trauma. She has had problems with her hamstring on the same side in the past but it was more in the mid body of the hamstring than down around her knee.  Past medical history reviewed Past surgical history reviewed. She is status post left total hip arthroplasty done four years ago and is doing well postoperatively. She is planning on having her right hip replaced sometime this summer. Medications reviewed Allergies reviewed    Review of Systems    as above Objective:   Physical Exam  Well-developed, well-nourished. No acute distress. Awake alert and oriented 3. Vital signs reviewed  Left knee: Full range of motion. No effusion. No fullness in the popliteal fossa. Joint line tenderness. Negative Thessaly's. 2+ patellofemoral crepitus. She is minimally tender to palpation 5-6 cm proximal to the popliteal fossa. No soft tissue swelling. No weakness. Neurovascularly intact distally.      Assessment & Plan:   Chronic left knee pain secondary to possible muscle  strain versus knee DJD  We discussed the merits of a diagnostic intra-articular cortisone injection but the patient would like to wait on that for now. Instead, I've given her the Askling exercises for hamstring tendinopathy and I've given her a prescription for Voltaren gel to apply 3 times a day as needed (she has had good success with this in the past). She'll continue with compression with activity and I think she can continue with all activity as tolerated. Follow-up with me in a month for reevaluation. If symptoms persist, then we will reconsider merits of a diagnostic intra-articular cortisone injection.

## 2016-10-28 ENCOUNTER — Ambulatory Visit (INDEPENDENT_AMBULATORY_CARE_PROVIDER_SITE_OTHER): Payer: PPO | Admitting: Sports Medicine

## 2016-10-28 ENCOUNTER — Encounter: Payer: Self-pay | Admitting: Sports Medicine

## 2016-10-28 VITALS — BP 137/87 | HR 59 | Ht 67.0 in | Wt 150.0 lb

## 2016-10-28 DIAGNOSIS — M1712 Unilateral primary osteoarthritis, left knee: Secondary | ICD-10-CM | POA: Diagnosis not present

## 2016-10-28 MED ORDER — METHYLPREDNISOLONE ACETATE 40 MG/ML IJ SUSP
40.0000 mg | Freq: Once | INTRAMUSCULAR | Status: AC
  Administered 2016-10-28: 40 mg via INTRA_ARTICULAR

## 2016-10-28 NOTE — Progress Notes (Signed)
   Subjective:    Patient ID: Brittany Shaffer, female    DOB: 05-08-1946, 71 y.o.   MRN: [id number]  HPI   Patient comes in today for follow-up on posterior distal left thigh pain. She is still having pain. She is also getting stiffness in the left knee although she denies pain in the knee itself. She has been told that she has arthritis in this left knee. Recent x-rays were done at [place]. We had discussed a possible diagnostic/therapeutic intra-articular injection at her last office visit if her symptoms had not improved by today. She would like to go ahead and proceed with that injection.       Review of Systems As above    Objective:   Physical Exam  Well-developed, well-nourished. No acute distress.  Left knee: Range of motion is 0-110. Trace effusion. She has no tenderness to palpation in the popliteal fossa nor along the posterior distal thigh. No tenderness to palpation along the hamstring tendons. No joint line tenderness to palpation. Negative McMurrays. Negative Thessaly's. Neurovascularly intact distally.      Assessment & Plan:   Persistent posterior left knee/thigh pain Left knee osteoarthritis  Although her presentation is a little atypical I think her symptoms are from her left knee osteoarthritis. For diagnostic as well as therapeutic reasons I've recommended an intra-articular cortisone injection. Patient agrees. An anterior lateral approach was utilized. Patient tolerated this without difficulty. She'll follow-up with me in 3 weeks for reevaluation. If symptoms persist despite today's injection then I would consider getting images of her left femur and thigh. She may continue with activity as toleratedup in the interim.  Consent obtained and verified. Time-out conducted. Noted no overlying erythema, induration, or other signs of local infection. Skin prepped in a sterile fashion. Topical analgesic spray: Ethyl chloride. Joint: left knee Needle:  25g 1.5 inch Completed without difficulty. Meds: 3cc 1% xylocaine, 1cc (40mg ) depomedrol  Advised to call if fevers/chills, erythema, induration, drainage, or persistent bleeding.

## 2016-11-18 ENCOUNTER — Ambulatory Visit: Payer: PPO | Admitting: Sports Medicine

## 2016-11-19 DIAGNOSIS — M25469 Effusion, unspecified knee: Secondary | ICD-10-CM | POA: Diagnosis not present

## 2016-11-19 DIAGNOSIS — M1611 Unilateral primary osteoarthritis, right hip: Secondary | ICD-10-CM | POA: Diagnosis not present

## 2016-11-27 ENCOUNTER — Other Ambulatory Visit: Payer: Self-pay | Admitting: Orthopedic Surgery

## 2016-11-28 ENCOUNTER — Ambulatory Visit: Payer: Self-pay | Admitting: Orthopedic Surgery

## 2016-11-28 DIAGNOSIS — R05 Cough: Secondary | ICD-10-CM | POA: Diagnosis not present

## 2016-12-10 ENCOUNTER — Ambulatory Visit: Payer: PPO | Admitting: Vascular Surgery

## 2016-12-10 NOTE — Patient Instructions (Addendum)
Brittany Shaffer  12/10/2016   Your procedure is scheduled on: 12/18/2016    Report to [facility] Main  Entrance- Report to admitting at 0630AM   Call this number if you have problems the morning of surgery [phone]   Remember: ONLY 1 PERSON MAY GO WITH YOU TO SHORT STAY TO GET  READY MORNING OF YOUR SURGERY.  Do not eat food or drink liquids :After Midnight.     Take these medicines the morning of surgery with A SIP OF WATER: None                               You may not have any metal on your body including hair pins and              piercings  Do not wear jewelry, make-up, lotions, powders or perfumes, deodorant             Do not wear nail polish.  Do not shave  48 hours prior to surgery.                Do not bring valuables to the hospital. [place].  Contacts, dentures or bridgework may not be worn into surgery.  Leave suitcase in the car. After surgery it may be brought to your room.                  Please read over the following fact sheets you were given: _____________________________________________________________________             [facility] - Preparing for Surgery Before surgery, you can play an important role.  Because skin is not sterile, your skin needs to be as free of germs as possible.  You can reduce the number of germs on your skin by washing with CHG (chlorahexidine gluconate) soap before surgery.  CHG is an antiseptic cleaner which kills germs and bonds with the skin to continue killing germs even after washing. Please DO NOT use if you have an allergy to CHG or antibacterial soaps.  If your skin becomes reddened/irritated stop using the CHG and inform your nurse when you arrive at Short Stay. Do not shave (including legs and underarms) for at least 48 hours prior to the first CHG shower.  You may shave your face/neck. Please follow these instructions carefully:  1.  Shower with  CHG Soap the night before surgery and the  morning of Surgery.  2.  If you choose to wash your hair, wash your hair first as usual with your  normal  shampoo.  3.  After you shampoo, rinse your hair and body thoroughly to remove the  shampoo.                           4.  Use CHG as you would any other liquid soap.  You can apply chg directly  to the skin and wash                       Gently with a scrungie or clean washcloth.  5.  Apply the CHG Soap to your body ONLY FROM THE NECK DOWN.   Do not use  on face/ open                           Wound or open sores. Avoid contact with eyes, ears mouth and genitals (private parts).                       Wash face,  Genitals (private parts) with your normal soap.             6.  Wash thoroughly, paying special attention to the area where your surgery  will be performed.  7.  Thoroughly rinse your body with warm water from the neck down.  8.  DO NOT shower/wash with your normal soap after using and rinsing off  the CHG Soap.                9.  Pat yourself dry with a clean towel.            10.  Wear clean pajamas.            11.  Place clean sheets on your bed the night of your first shower and do not  sleep with pets. Day of Surgery : Do not apply any lotions/deodorants the morning of surgery.  Please wear clean clothes to the hospital/surgery center.  FAILURE TO FOLLOW THESE INSTRUCTIONS MAY RESULT IN THE CANCELLATION OF YOUR SURGERY PATIENT SIGNATURE_________________________________  NURSE SIGNATURE__________________________________  ________________________________________________________________________   Brittany Shaffer  An incentive spirometer is a tool that can help keep your lungs clear and active. This tool measures how well you are filling your lungs with each breath. Taking long deep breaths may help reverse or decrease the chance of developing breathing (pulmonary) problems (especially infection) following:  A long period of  time when you are unable to move or be active. BEFORE THE PROCEDURE   If the spirometer includes an indicator to show your best effort, your nurse or respiratory therapist will set it to a desired goal.  If possible, sit up straight or lean slightly forward. Try not to slouch.  Hold the incentive spirometer in an upright position. INSTRUCTIONS FOR USE  1. Sit on the edge of your bed if possible, or sit up as far as you can in bed or on a chair. 2. Hold the incentive spirometer in an upright position. 3. Breathe out normally. 4. Place the mouthpiece in your mouth and seal your lips tightly around it. 5. Breathe in slowly and as deeply as possible, raising the piston or the ball toward the top of the column. 6. Hold your breath for 3-5 seconds or for as long as possible. Allow the piston or ball to fall to the bottom of the column. 7. Remove the mouthpiece from your mouth and breathe out normally. 8. Rest for a few seconds and repeat Steps 1 through 7 at least 10 times every 1-2 hours when you are awake. Take your time and take a few normal breaths between deep breaths. 9. The spirometer may include an indicator to show your best effort. Use the indicator as a goal to work toward during each repetition. 10. After each set of 10 deep breaths, practice coughing to be sure your lungs are clear. If you have an incision (the cut made at the time of surgery), support your incision when coughing by placing a pillow or rolled up towels firmly against it. Once you are able to get out of bed, walk around  indoors and cough well. You may stop using the incentive spirometer when instructed by your caregiver.  RISKS AND COMPLICATIONS  Take your time so you do not get dizzy or light-headed.  If you are in pain, you may need to take or ask for pain medication before doing incentive spirometry. It is harder to take a deep breath if you are having pain. AFTER USE  Rest and breathe slowly and easily.  It can  be helpful to keep track of a log of your progress. Your caregiver can provide you with a simple table to help with this. If you are using the spirometer at home, follow these instructions: [place] IF:   You are having difficultly using the spirometer.  You have trouble using the spirometer as often as instructed.  Your pain medication is not giving enough relief while using the spirometer.  You develop fever of 100.5 F (38.1 C) or higher. SEEK IMMEDIATE MEDICAL CARE IF:   You cough up bloody sputum that had not been present before.  You develop fever of 102 F (38.9 C) or greater.  You develop worsening pain at or near the incision site. MAKE SURE YOU:   Understand these instructions.  Will watch your condition.  Will get help right away if you are not doing well or get worse. Document Released: 12/30/2006 Document Revised: 11/11/2011 Document Reviewed: 03/02/2007 ExitCare Patient Information 2014 ExitCare, [place].   ________________________________________________________________________  WHAT IS A BLOOD TRANSFUSION? Blood Transfusion Information  A transfusion is the replacement of blood or some of its parts. Blood is made up of multiple cells which provide different functions.  Red blood cells carry oxygen and are used for blood loss replacement.  White blood cells fight against infection.  Platelets control bleeding.  Plasma helps clot blood.  Other blood products are available for specialized needs, such as hemophilia or other clotting disorders. BEFORE THE TRANSFUSION  Who gives blood for transfusions?   Healthy volunteers who are fully evaluated to make sure their blood is safe. This is blood bank blood. Transfusion therapy is the safest it has ever been in the practice of medicine. Before blood is taken from a donor, a complete history is taken to make sure that person has no history of diseases nor engages in risky social behavior (examples are  intravenous drug use or sexual activity with multiple partners). The donor's travel history is screened to minimize risk of transmitting infections, such as malaria. The donated blood is tested for signs of infectious diseases, such as HIV and hepatitis. The blood is then tested to be sure it is compatible with you in order to minimize the chance of a transfusion reaction. If you or a relative donates blood, this is often done in anticipation of surgery and is not appropriate for emergency situations. It takes many days to process the donated blood. RISKS AND COMPLICATIONS Although transfusion therapy is very safe and saves many lives, the main dangers of transfusion include:   Getting an infectious disease.  Developing a transfusion reaction. This is an allergic reaction to something in the blood you were given. Every precaution is taken to prevent this. The decision to have a blood transfusion has been considered carefully by your caregiver before blood is given. Blood is not given unless the benefits outweigh the risks. AFTER THE TRANSFUSION  Right after receiving a blood transfusion, you will usually feel much better and more energetic. This is especially true if your red blood cells have gotten  low (anemic). The transfusion raises the level of the red blood cells which carry oxygen, and this usually causes an energy increase.  The nurse administering the transfusion will monitor you carefully for complications. HOME CARE INSTRUCTIONS  No special instructions are needed after a transfusion. You may find your energy is better. Speak with your caregiver about any limitations on activity for underlying diseases you may have. SEEK MEDICAL CARE IF:   Your condition is not improving after your transfusion.  You develop redness or irritation at the intravenous (IV) site. SEEK IMMEDIATE MEDICAL CARE IF:  Any of the following symptoms occur over the next 12 hours:  Shaking chills.  You have a  temperature by mouth above 102 F (38.9 C), not controlled by medicine.  Chest, back, or muscle pain.  People around you feel you are not acting correctly or are confused.  Shortness of breath or difficulty breathing.  Dizziness and fainting.  You get a rash or develop hives.  You have a decrease in urine output.  Your urine turns a dark color or changes to pink, red, or brown. Any of the following symptoms occur over the next 10 days:  You have a temperature by mouth above 102 F (38.9 C), not controlled by medicine.  Shortness of breath.  Weakness after normal activity.  The white part of the eye turns yellow (jaundice).  You have a decrease in the amount of urine or are urinating less often.  Your urine turns a dark color or changes to pink, red, or brown. Document Released: 08/16/2000 Document Revised: 11/11/2011 Document Reviewed: 04/04/2008 [facility] Patient Information 2014 [place], [place].  _______________________________________________________________________

## 2016-12-12 ENCOUNTER — Encounter (HOSPITAL_COMMUNITY): Payer: Self-pay

## 2016-12-12 ENCOUNTER — Encounter (HOSPITAL_COMMUNITY)
Admission: RE | Admit: 2016-12-12 | Discharge: 2016-12-12 | Disposition: A | Discharge status: Home / Self Care | Payer: PPO | Source: Ambulatory Visit | Attending: Orthopedic Surgery | Admitting: Orthopedic Surgery

## 2016-12-12 DIAGNOSIS — M1611 Unilateral primary osteoarthritis, right hip: Secondary | ICD-10-CM | POA: Diagnosis not present

## 2016-12-12 DIAGNOSIS — Z01818 Encounter for other preprocedural examination: Secondary | ICD-10-CM | POA: Diagnosis not present

## 2016-12-12 LAB — CBC
HEMATOCRIT: 40.8 % (ref 36.0–46.0)
Hemoglobin: 13.7 g/dL (ref 12.0–15.0)
MCH: 33.3 pg (ref 26.0–34.0)
MCHC: 33.6 g/dL (ref 30.0–36.0)
MCV: 99 fL (ref 78.0–100.0)
Platelets: 252 10*3/uL (ref 150–400)
RBC: 4.12 MIL/uL (ref 3.87–5.11)
RDW: 13.1 % (ref 11.5–15.5)
WBC: 5.5 10*3/uL (ref 4.0–10.5)

## 2016-12-12 LAB — COMPREHENSIVE METABOLIC PANEL
ALBUMIN: 4.2 g/dL (ref 3.5–5.0)
ALT: 14 U/L (ref 14–54)
ANION GAP: 7 (ref 5–15)
AST: 25 U/L (ref 15–41)
Alkaline Phosphatase: 67 U/L (ref 38–126)
BUN: 20 mg/dL (ref 6–20)
CO2: 28 mmol/L (ref 22–32)
Calcium: 9.4 mg/dL (ref 8.9–10.3)
Chloride: 105 mmol/L (ref 101–111)
Creatinine, Ser: 0.85 mg/dL (ref 0.44–1.00)
GFR calc Af Amer: >60 mL/min (ref >60)
GFR calc non Af Amer: >60 mL/min (ref >60)
Glucose, Bld: 101 mg/dL — ABNORMAL HIGH (ref 65–99)
POTASSIUM: 4.9 mmol/L (ref 3.5–5.1)
SODIUM: 140 mmol/L (ref 135–145)
Total Bilirubin: 0.6 mg/dL (ref 0.3–1.2)
Total Protein: 7.3 g/dL (ref 6.5–8.1)

## 2016-12-12 LAB — SURGICAL PCR SCREEN
MRSA, PCR: NEGATIVE
Staphylococcus aureus: NEGATIVE

## 2016-12-12 LAB — PROTIME-INR
INR: 0.98
Prothrombin Time: 13 seconds (ref 11.4–15.2)

## 2016-12-12 LAB — APTT: APTT: 31 s (ref 24–36)

## 2016-12-13 ENCOUNTER — Other Ambulatory Visit: Payer: Self-pay | Admitting: Orthopedic Surgery

## 2016-12-14 ENCOUNTER — Ambulatory Visit: Payer: Self-pay | Admitting: Orthopedic Surgery

## 2016-12-14 NOTE — H&P (Signed)
Brittany Shaffer DOB: 1945-10-07 Married / Language: English / Race: White Female Date of Admission:  12/18/2016 CC:  Right hip pain History of Present Illness  The patient is a 71 year old female who comes in for a preoperative History and Physical. The patient is scheduled for a right total hip arthroplasty (anterior) to be performed by Dr. Dione Plover. Aluisio, MD at [facility] on 12-18-2016. The patient is being followed for their right hip pain and osteoarthritis. They are year(s) out from when symptoms began. Symptoms reported include: pain (posterior pain(buttock), mild groin pain). The patient feels that they are doing poorly and report their pain level to be moderate (pain varies when active(bending right hip)). The following medication has been used for pain control: antiinflammatory medication. She does see some relith etodolac. She has minimal groin pain. Most of her pain is located in the buttocks. She does have some leg pain at night but denies sharp, shooting pain in the legs. She does have a history of lumbar DDD and has some right sided low back pain. No numbness or tingling in the legs. The patient comes in today 4 years out from left total hip arthroplasty. The patient states that she is doing very well at this time. The pain is under excellent control at this time and describes their pain as no pain. The patient is currently doing home exercise program. The patient feels that they are progressing well at this time. She has not had any issues with the left hip. She has returned to an active lifestyle. She plays tennis, pickle ball, and walks. The right hip appears to be her biggest problem at this time. She has pain in the buttock area and lateral hip occasionally radiating to her groin. She notes more functional issues with the hip in a harder time getting around. She feels like she is lost a lot of motion also. Hip is hurting her with most activities as well as at rest. She is not  having lower extremity weakness or paresthesia with this. She does have pain at night at times. In addition, she has developed pain in her left knee. The knee pain is posterior and posterior medial. She has not had any recent injury leading to this. Especially bothers her getting up and down or doing stairs. Bending and squatting is difficult. Her left hip which we replaced a few years ago is doing great at this time and she is very pleased with that. Her right knee is not symptomatic at this time. With regards to the left knee she is not getting swelling or mechanical symptoms. She is ready to proceed with surgery for the right hip at this time. They have been treated conservatively in the past for the above stated problem and despite conservative measures, they continue to have progressive pain and severe functional limitations and dysfunction. They have failed non-operative management including home exercise, medications. It is felt that they would benefit from undergoing total joint replacement. Risks and benefits of the procedure have been discussed with the patient and they elect to proceed with surgery. There are no active contraindications to surgery such as ongoing infection or rapidly progressive neurological disease.  Problem List/Past Medical  Patellofemoral arthritis (M17.10)  Pain of right heel (M79.671)  Status post left hip replacement (V42.595)  Primary osteoarthritis of left knee (M17.12)  Primary osteoarthritis of right hip (M16.11)  Plantar fascial fibromatosis of right foot (M72.2)  Aftercare following left hip joint replacement surgery (Z47.1)  Chronic Pain  Heart murmur  Hypercholesterolemia  Osteoarthritis  Tinnitus  Varicose veins   Allergies  No Known Drug Allergies  Intolerances Codeine/Codeine Derivatives  intolerance  Family History Cancer  First Degree Relatives. mother, father and sister Hypertension  mother  Social History Tobacco use   Never smoker. 11/20/2015 never smoker Tobacco / smoke exposure  no Previously in rehab  no Pain Contract  no Drug/Alcohol Rehab (Currently)  no Alcohol use  current drinker; drinks beer, wine and hard liquor; 5-7 per week Current work status  retired Children  2 Illicit drug use  no Post-Surgical Plans  Plan is to go home. Marital status  married Exercise  Exercises daily; does running / walking and team sport Living situation  live with spouse Number of flights of stairs before winded  greater than 5 Advance Directives  Living Will, Healthcare POA  Medication History Etodolac (500MG  Tablet, 1 (one) Tablet Oral two times daily, Taken starting 04/04/2016) Active. Leg Cramp Relief (Oral) Active. Voltaren (1% Gel, Transdermal) Active. (prn when she plays tennis) Ambien (Oral) Specific strength unknown - Active. (only on occasion) Estradiol (0.025MG /24HR Patch Weekly, Transdermal) Active.   Past Surgical History  Tubal Ligation  Hysterectomy (not due to cancer) - Complete  Post-op complications. incontinence, which has resolved Total Hip Replacement - Left  Date: 03/2013.   Review of Systems General Not Present- Chills, Fatigue, Fever, Memory Loss, Night Sweats, Weight Gain and Weight Loss. Skin Not Present- Eczema, Hives, Itching, Lesions and Rash. HEENT Present- Tinnitus. Not Present- Dentures, Double Vision, Headache, Hearing Loss and Visual Loss. Respiratory Not Present- Allergies, Chronic Cough, Coughing up blood, Shortness of breath at rest and Shortness of breath with exertion. Cardiovascular Not Present- Chest Pain, Difficulty Breathing Lying Down, Murmur, Palpitations, Racing/skipping heartbeats and Swelling. Gastrointestinal Not Present- Abdominal Pain, Bloody Stool, Constipation, Diarrhea, Difficulty Swallowing, Heartburn, Jaundice, Loss of appetitie, Nausea and Vomiting. Female Genitourinary Not Present- Blood in Urine, Discharge, Flank Pain,  Incontinence, Painful Urination, Urgency, Urinary frequency, Urinary Retention, Urinating at Night and Weak urinary stream. Musculoskeletal Present- Joint Pain and Morning Stiffness. Not Present- Back Pain, Joint Swelling, Muscle Pain, Muscle Weakness and Spasms. Neurological Not Present- Blackout spells, Difficulty with balance, Dizziness, Paralysis, Tremor and Weakness. Psychiatric Not Present- Insomnia.  Vitals Weight: 153 lb Height: 67.5in Body Surface Area: 1.81 m Body Mass Index: 23.61 kg/m  Pulse: 56 (Regular)  BP: 142/86 (Sitting, Left Arm, Standard)   Physical Exam General Mental Status -Alert, cooperative and good historian. General Appearance-pleasant, Not in acute distress. Orientation-Oriented X3. Build & Nutrition-Well nourished and Well developed.  Head and Neck Head-normocephalic, atraumatic . Neck Global Assessment - supple, no bruit auscultated on the right, no bruit auscultated on the left.  Eye Pupil - Bilateral-Regular and Round. Motion - Bilateral-EOMI.  Chest and Lung Exam Auscultation Breath sounds - clear at anterior chest wall and clear at posterior chest wall. Adventitious sounds - No Adventitious sounds.  Cardiovascular Auscultation Rhythm - Regular rate and rhythm. Heart Sounds - S1 WNL and S2 WNL. Murmurs & Other Heart Sounds: Murmur 1 - Location - Aortic Area, Right Carotid and Sternal Border - Left. Timing - Late systolic. Grade - II/VI. Character - Crescendo.  Abdomen Palpation/Percussion Tenderness - Abdomen is non-tender to palpation. Rigidity (guarding) - Abdomen is soft. Auscultation Auscultation of the abdomen reveals - Bowel sounds normal.  Female Genitourinary Note: Not done, not pertinent to present illness   Musculoskeletal Note: On exam, well-developed female, alert and oriented, in no apparent distress.  Left hip can be flexed to 130, rotated in 30, out 40, abduct 40 without discomfort. Right hip  flex to 100, no internal rotation, about 20 external rotation, 20 abduction with discomfort. Her knees show no effusion. Right knee 0 to 135. No tenderness or instability. There is minimal crepitus on range of motion. Left knee, moderate crepitus on range of motion, range 5 to 135. Some tenderness medial greater than lateral and no instability noted. Pulse, sensation, motor intact distally. Gait pattern significantly antalgic on right with an abductor lurch.  Her radiographs, AP pelvis and lateral of the hip show the prosthesis on the left in excellent position with no periprosthetic abnormalities. She also had an AP of that hip. On the right, she has got bone-on-bone arthritis throughout with subchondral cystic formation. AP and lateral of the knees show that the right knee no evidence of any arthritis. Left knee medial joint space narrowing and patellofemoral narrowing.  Assessment & Plan Primary osteoarthritis of right hip (M16.11)  Note:Surgical Plans: Right Total Hip Replacement - Anterior Approach  Disposition: Home with daughter  PCP: Dr. Myriam Jacobson - Patient has been seen preoperatively and felt to be stable for surgery.  IV TXA  Anesthesia Issues: None  Patient was instructed on what medications to stop prior to surgery.  Signed electronically by Joelene Millin, III PA-C

## 2016-12-18 ENCOUNTER — Encounter (HOSPITAL_COMMUNITY): Payer: Self-pay | Admitting: Certified Registered Nurse Anesthetist

## 2016-12-18 ENCOUNTER — Inpatient Hospital Stay (HOSPITAL_COMMUNITY): Payer: PPO

## 2016-12-18 ENCOUNTER — Inpatient Hospital Stay (HOSPITAL_COMMUNITY)
Admission: RE | Admit: 2016-12-18 | Discharge: 2016-12-19 | DRG: 470 | Disposition: A | Discharge status: Home Health | Payer: PPO | Source: Ambulatory Visit | Attending: Orthopedic Surgery | Admitting: Orthopedic Surgery

## 2016-12-18 ENCOUNTER — Encounter (HOSPITAL_COMMUNITY)
Admission: RE | Disposition: A | Discharge status: Home Health | Payer: Self-pay | Source: Ambulatory Visit | Attending: Orthopedic Surgery

## 2016-12-18 ENCOUNTER — Inpatient Hospital Stay (HOSPITAL_COMMUNITY): Payer: PPO | Admitting: Anesthesiology

## 2016-12-18 DIAGNOSIS — M25751 Osteophyte, right hip: Secondary | ICD-10-CM | POA: Diagnosis not present

## 2016-12-18 DIAGNOSIS — R011 Cardiac murmur, unspecified: Secondary | ICD-10-CM | POA: Diagnosis present

## 2016-12-18 DIAGNOSIS — M1611 Unilateral primary osteoarthritis, right hip: Secondary | ICD-10-CM | POA: Diagnosis not present

## 2016-12-18 DIAGNOSIS — Z96642 Presence of left artificial hip joint: Secondary | ICD-10-CM

## 2016-12-18 DIAGNOSIS — M169 Osteoarthritis of hip, unspecified: Secondary | ICD-10-CM | POA: Diagnosis present

## 2016-12-18 DIAGNOSIS — Z79899 Other long term (current) drug therapy: Secondary | ICD-10-CM

## 2016-12-18 DIAGNOSIS — Z8249 Family history of ischemic heart disease and other diseases of the circulatory system: Secondary | ICD-10-CM

## 2016-12-18 DIAGNOSIS — Z96641 Presence of right artificial hip joint: Secondary | ICD-10-CM | POA: Diagnosis not present

## 2016-12-18 DIAGNOSIS — E7800 Pure hypercholesterolemia, unspecified: Secondary | ICD-10-CM | POA: Diagnosis not present

## 2016-12-18 DIAGNOSIS — Z791 Long term (current) use of non-steroidal anti-inflammatories (NSAID): Secondary | ICD-10-CM | POA: Diagnosis not present

## 2016-12-18 DIAGNOSIS — M722 Plantar fascial fibromatosis: Secondary | ICD-10-CM | POA: Diagnosis not present

## 2016-12-18 DIAGNOSIS — Z79890 Hormone replacement therapy (postmenopausal): Secondary | ICD-10-CM | POA: Diagnosis not present

## 2016-12-18 DIAGNOSIS — Z885 Allergy status to narcotic agent status: Secondary | ICD-10-CM | POA: Diagnosis not present

## 2016-12-18 DIAGNOSIS — Z96649 Presence of unspecified artificial hip joint: Secondary | ICD-10-CM

## 2016-12-18 DIAGNOSIS — Z90710 Acquired absence of both cervix and uterus: Secondary | ICD-10-CM

## 2016-12-18 DIAGNOSIS — Z471 Aftercare following joint replacement surgery: Secondary | ICD-10-CM | POA: Diagnosis not present

## 2016-12-18 DIAGNOSIS — M1712 Unilateral primary osteoarthritis, left knee: Secondary | ICD-10-CM | POA: Diagnosis present

## 2016-12-18 HISTORY — PX: TOTAL HIP ARTHROPLASTY: SHX124

## 2016-12-18 LAB — TYPE AND SCREEN
ABO/RH(D): O POS
ANTIBODY SCREEN: NEGATIVE

## 2016-12-18 SURGERY — ARTHROPLASTY, HIP, TOTAL, ANTERIOR APPROACH
Anesthesia: Spinal | Site: Hip | Laterality: Right

## 2016-12-18 MED ORDER — FENTANYL CITRATE (PF) 100 MCG/2ML IJ SOLN
INTRAMUSCULAR | Status: DC | PRN
  Administered 2016-12-18: 100 ug via INTRAVENOUS

## 2016-12-18 MED ORDER — PROPOFOL 10 MG/ML IV BOLUS
INTRAVENOUS | Status: AC
  Filled 2016-12-18: qty 80

## 2016-12-18 MED ORDER — ZOLPIDEM TARTRATE 5 MG PO TABS
5.0000 mg | ORAL_TABLET | Freq: Every evening | ORAL | Status: DC | PRN
  Filled 2016-12-18: qty 1

## 2016-12-18 MED ORDER — BUPIVACAINE HCL (PF) 0.25 % IJ SOLN
INTRAMUSCULAR | Status: AC
  Filled 2016-12-18: qty 30

## 2016-12-18 MED ORDER — ACETAMINOPHEN 10 MG/ML IV SOLN
INTRAVENOUS | Status: AC
  Filled 2016-12-18: qty 100

## 2016-12-18 MED ORDER — METOCLOPRAMIDE HCL 5 MG/ML IJ SOLN
5.0000 mg | Freq: Three times a day (TID) | INTRAMUSCULAR | Status: DC | PRN
  Administered 2016-12-19: 10 mg via INTRAVENOUS
  Filled 2016-12-18: qty 2

## 2016-12-18 MED ORDER — PROPOFOL 500 MG/50ML IV EMUL
INTRAVENOUS | Status: DC | PRN
  Administered 2016-12-18: 30 mg via INTRAVENOUS

## 2016-12-18 MED ORDER — CEFAZOLIN SODIUM-DEXTROSE 2-4 GM/100ML-% IV SOLN
INTRAVENOUS | Status: AC
  Filled 2016-12-18: qty 100

## 2016-12-18 MED ORDER — DOCUSATE SODIUM 100 MG PO CAPS
100.0000 mg | ORAL_CAPSULE | Freq: Two times a day (BID) | ORAL | Status: DC
  Administered 2016-12-18 – 2016-12-19 (×2): 100 mg via ORAL
  Filled 2016-12-18 (×2): qty 1

## 2016-12-18 MED ORDER — ONDANSETRON HCL 4 MG/2ML IJ SOLN: 4.0000 mg | Freq: Four times a day (QID) | INTRAMUSCULAR | Status: DC | PRN

## 2016-12-18 MED ORDER — ACETAMINOPHEN 325 MG PO TABS: 650.0000 mg | ORAL_TABLET | Freq: Four times a day (QID) | ORAL | Status: DC | PRN

## 2016-12-18 MED ORDER — ACETAMINOPHEN 650 MG RE SUPP: 650.0000 mg | Freq: Four times a day (QID) | RECTAL | Status: DC | PRN

## 2016-12-18 MED ORDER — EPHEDRINE SULFATE 50 MG/ML IJ SOLN
INTRAMUSCULAR | Status: DC | PRN
  Administered 2016-12-18 (×3): 10 mg via INTRAVENOUS

## 2016-12-18 MED ORDER — CHLORHEXIDINE GLUCONATE 4 % EX LIQD: 60.0000 mL | Freq: Once | CUTANEOUS | Status: DC

## 2016-12-18 MED ORDER — DEXAMETHASONE SODIUM PHOSPHATE 10 MG/ML IJ SOLN
10.0000 mg | Freq: Once | INTRAMUSCULAR | Status: AC
  Administered 2016-12-19: 10 mg via INTRAVENOUS
  Filled 2016-12-18: qty 1

## 2016-12-18 MED ORDER — PROPOFOL 500 MG/50ML IV EMUL
INTRAVENOUS | Status: DC | PRN
  Administered 2016-12-18: 75 ug/kg/min via INTRAVENOUS

## 2016-12-18 MED ORDER — LACTATED RINGERS IV SOLN
INTRAVENOUS | Status: DC
  Administered 2016-12-18 (×3): via INTRAVENOUS

## 2016-12-18 MED ORDER — ACETAMINOPHEN 500 MG PO TABS
1000.0000 mg | ORAL_TABLET | Freq: Four times a day (QID) | ORAL | Status: AC
  Administered 2016-12-18 – 2016-12-19 (×4): 1000 mg via ORAL
  Filled 2016-12-18 (×4): qty 2

## 2016-12-18 MED ORDER — BUPIVACAINE HCL (PF) 0.25 % IJ SOLN
INTRAMUSCULAR | Status: DC | PRN
  Administered 2016-12-18: 30 mL

## 2016-12-18 MED ORDER — PHENOL 1.4 % MT LIQD: 1.0000 | OROMUCOSAL | Status: DC | PRN

## 2016-12-18 MED ORDER — BUPIVACAINE HCL (PF) 0.5 % IJ SOLN
INTRAMUSCULAR | Status: DC | PRN
  Administered 2016-12-18: 3 mL

## 2016-12-18 MED ORDER — CEFAZOLIN SODIUM-DEXTROSE 2-4 GM/100ML-% IV SOLN
2.0000 g | Freq: Four times a day (QID) | INTRAVENOUS | Status: AC
  Administered 2016-12-18: 2 g via INTRAVENOUS
  Filled 2016-12-18: qty 100

## 2016-12-18 MED ORDER — METHOCARBAMOL 500 MG PO TABS
500.0000 mg | ORAL_TABLET | Freq: Four times a day (QID) | ORAL | Status: DC | PRN
  Administered 2016-12-18 (×2): 500 mg via ORAL
  Filled 2016-12-18 (×2): qty 1

## 2016-12-18 MED ORDER — MORPHINE SULFATE (PF) 2 MG/ML IV SOLN
1.0000 mg | INTRAVENOUS | Status: DC | PRN
  Administered 2016-12-18: 1 mg via INTRAVENOUS
  Filled 2016-12-18: qty 1

## 2016-12-18 MED ORDER — TRAMADOL HCL 50 MG PO TABS
50.0000 mg | ORAL_TABLET | Freq: Four times a day (QID) | ORAL | Status: DC | PRN
  Administered 2016-12-18 – 2016-12-19 (×2): 100 mg via ORAL
  Filled 2016-12-18 (×2): qty 2

## 2016-12-18 MED ORDER — CEFAZOLIN SODIUM-DEXTROSE 2-4 GM/100ML-% IV SOLN
2.0000 g | Freq: Once | INTRAVENOUS | Status: AC
  Administered 2016-12-18: 2 g via INTRAVENOUS

## 2016-12-18 MED ORDER — TRANEXAMIC ACID 1000 MG/10ML IV SOLN
1000.0000 mg | INTRAVENOUS | Status: AC
  Administered 2016-12-18: 1000 mg via INTRAVENOUS
  Filled 2016-12-18: qty 1100

## 2016-12-18 MED ORDER — FENTANYL CITRATE (PF) 100 MCG/2ML IJ SOLN: 25.0000 ug | INTRAMUSCULAR | Status: DC | PRN

## 2016-12-18 MED ORDER — RIVAROXABAN 10 MG PO TABS
10.0000 mg | ORAL_TABLET | Freq: Every day | ORAL | Status: DC
  Administered 2016-12-19: 10 mg via ORAL
  Filled 2016-12-18: qty 1

## 2016-12-18 MED ORDER — PROMETHAZINE-DM 6.25-15 MG/5ML PO SYRP: 2.5000 mL | ORAL_SOLUTION | Freq: Four times a day (QID) | ORAL | Status: DC | PRN

## 2016-12-18 MED ORDER — ONDANSETRON HCL 4 MG/2ML IJ SOLN
4.0000 mg | Freq: Once | INTRAMUSCULAR | Status: AC | PRN
  Administered 2016-12-18: 4 mg via INTRAVENOUS

## 2016-12-18 MED ORDER — SODIUM CHLORIDE 0.9 % IV SOLN
INTRAVENOUS | Status: DC
  Administered 2016-12-18: 16:00:00 via INTRAVENOUS

## 2016-12-18 MED ORDER — STERILE WATER FOR IRRIGATION IR SOLN
Status: DC | PRN
  Administered 2016-12-18: 2000 mL

## 2016-12-18 MED ORDER — METOCLOPRAMIDE HCL 5 MG PO TABS: 5.0000 mg | ORAL_TABLET | Freq: Three times a day (TID) | ORAL | Status: DC | PRN

## 2016-12-18 MED ORDER — FLEET ENEMA 7-19 GM/118ML RE ENEM: 1.0000 | ENEMA | Freq: Once | RECTAL | Status: DC | PRN

## 2016-12-18 MED ORDER — FENTANYL CITRATE (PF) 100 MCG/2ML IJ SOLN
INTRAMUSCULAR | Status: AC
  Filled 2016-12-18: qty 2

## 2016-12-18 MED ORDER — ONDANSETRON HCL 4 MG PO TABS
4.0000 mg | ORAL_TABLET | Freq: Four times a day (QID) | ORAL | Status: DC | PRN
  Administered 2016-12-19: 4 mg via ORAL
  Filled 2016-12-18: qty 1

## 2016-12-18 MED ORDER — DIPHENHYDRAMINE HCL 12.5 MG/5ML PO ELIX: 12.5000 mg | ORAL_SOLUTION | ORAL | Status: DC | PRN

## 2016-12-18 MED ORDER — DEXAMETHASONE SODIUM PHOSPHATE 10 MG/ML IJ SOLN
10.0000 mg | Freq: Once | INTRAMUSCULAR | Status: AC
  Administered 2016-12-18: 10 mg via INTRAVENOUS

## 2016-12-18 MED ORDER — ONDANSETRON HCL 4 MG/2ML IJ SOLN
INTRAMUSCULAR | Status: AC
  Filled 2016-12-18: qty 2

## 2016-12-18 MED ORDER — BISACODYL 10 MG RE SUPP: 10.0000 mg | Freq: Every day | RECTAL | Status: DC | PRN

## 2016-12-18 MED ORDER — 0.9 % SODIUM CHLORIDE (POUR BTL) OPTIME
TOPICAL | Status: DC | PRN
  Administered 2016-12-18: 1000 mL

## 2016-12-18 MED ORDER — MENTHOL 3 MG MT LOZG: 1.0000 | LOZENGE | OROMUCOSAL | Status: DC | PRN

## 2016-12-18 MED ORDER — TRANEXAMIC ACID 1000 MG/10ML IV SOLN
1000.0000 mg | Freq: Once | INTRAVENOUS | Status: AC
  Administered 2016-12-18: 1000 mg via INTRAVENOUS
  Filled 2016-12-18: qty 1100

## 2016-12-18 MED ORDER — HYDROMORPHONE HCL 2 MG PO TABS
2.0000 mg | ORAL_TABLET | ORAL | Status: DC | PRN
  Administered 2016-12-18 – 2016-12-19 (×5): 2 mg via ORAL
  Filled 2016-12-18 (×5): qty 1

## 2016-12-18 MED ORDER — CEFAZOLIN SODIUM-DEXTROSE 2-4 GM/100ML-% IV SOLN
2.0000 g | INTRAVENOUS | Status: AC
  Administered 2016-12-18: 2 g via INTRAVENOUS

## 2016-12-18 MED ORDER — METHOCARBAMOL 1000 MG/10ML IJ SOLN
500.0000 mg | Freq: Four times a day (QID) | INTRAVENOUS | Status: DC | PRN
  Filled 2016-12-18: qty 5

## 2016-12-18 MED ORDER — ACETAMINOPHEN 10 MG/ML IV SOLN
1000.0000 mg | Freq: Once | INTRAVENOUS | Status: AC
  Administered 2016-12-18: 1000 mg via INTRAVENOUS

## 2016-12-18 MED ORDER — POLYETHYLENE GLYCOL 3350 17 G PO PACK: 17.0000 g | PACK | Freq: Every day | ORAL | Status: DC | PRN

## 2016-12-18 SURGICAL SUPPLY — 38 items
BAG ZIPLOCK 12X15 (MISCELLANEOUS) ×2 IMPLANT
BLADE SAG 18X100X1.27 (BLADE) ×2 IMPLANT
CAPT HIP TOTAL 2 ×2 IMPLANT
CLOTH BEACON ORANGE TIMEOUT ST (SAFETY) ×2 IMPLANT
COVER PERINEAL POST (MISCELLANEOUS) ×2 IMPLANT
COVER SURGICAL LIGHT HANDLE (MISCELLANEOUS) ×2 IMPLANT
DECANTER SPIKE VIAL GLASS SM (MISCELLANEOUS) ×2 IMPLANT
DRAPE STERI IOBAN 125X83 (DRAPES) ×2 IMPLANT
DRAPE U-SHAPE 47X51 STRL (DRAPES) ×4 IMPLANT
DRSG ADAPTIC 3X8 NADH LF (GAUZE/BANDAGES/DRESSINGS) ×2 IMPLANT
DRSG MEPILEX BORDER 4X4 (GAUZE/BANDAGES/DRESSINGS) ×2 IMPLANT
DRSG MEPILEX BORDER 4X8 (GAUZE/BANDAGES/DRESSINGS) ×2 IMPLANT
DURAPREP 26ML APPLICATOR (WOUND CARE) ×2 IMPLANT
ELECT REM PT RETURN 15FT ADLT (MISCELLANEOUS) ×2 IMPLANT
EVACUATOR 1/8 PVC DRAIN (DRAIN) ×2 IMPLANT
GLOVE BIO SURGEON STRL SZ7.5 (GLOVE) ×4 IMPLANT
GLOVE BIO SURGEON STRL SZ8 (GLOVE) ×4 IMPLANT
GLOVE BIOGEL PI IND STRL 6.5 (GLOVE) ×1 IMPLANT
GLOVE BIOGEL PI IND STRL 7.5 (GLOVE) ×1 IMPLANT
GLOVE BIOGEL PI IND STRL 8 (GLOVE) ×2 IMPLANT
GLOVE BIOGEL PI INDICATOR 6.5 (GLOVE) ×1
GLOVE BIOGEL PI INDICATOR 7.5 (GLOVE) ×1
GLOVE BIOGEL PI INDICATOR 8 (GLOVE) ×2
GLOVE SURG SS PI 6.5 STRL IVOR (GLOVE) ×2 IMPLANT
GLOVE SURG SS PI 7.5 STRL IVOR (GLOVE) ×2 IMPLANT
GOWN SPEC L3 XXLG W/TWL (GOWN DISPOSABLE) ×2 IMPLANT
GOWN STRL REUS W/TWL LRG LVL3 (GOWN DISPOSABLE) ×4 IMPLANT
GOWN STRL REUS W/TWL XL LVL3 (GOWN DISPOSABLE) ×2 IMPLANT
PACK ANTERIOR HIP CUSTOM (KITS) ×2 IMPLANT
STRIP CLOSURE SKIN 1/2X4 (GAUZE/BANDAGES/DRESSINGS) ×4 IMPLANT
SUT ETHIBOND NAB CT1 #1 30IN (SUTURE) ×2 IMPLANT
SUT MNCRL AB 4-0 PS2 18 (SUTURE) ×2 IMPLANT
SUT STRATAFIX 0 PDS 27 VIOLET (SUTURE) ×2
SUT VIC AB 2-0 CT1 27 (SUTURE) ×2
SUT VIC AB 2-0 CT1 TAPERPNT 27 (SUTURE) ×2 IMPLANT
SUTURE STRATFX 0 PDS 27 VIOLET (SUTURE) ×1 IMPLANT
TRAY FOLEY CATH SILVER 14FR (SET/KITS/TRAYS/PACK) ×2 IMPLANT
YANKAUER SUCT BULB TIP 10FT TU (MISCELLANEOUS) ×2 IMPLANT

## 2016-12-18 NOTE — Transfer of Care (Signed)
Immediate Anesthesia Transfer of Care Note  Patient: Brittany Shaffer  Procedure(s) Performed: Procedure(s): RIGHT TOTAL HIP ARTHROPLASTY ANTERIOR APPROACH (Right)  Patient Location: PACU  Anesthesia Type:Spinal  Level of Consciousness: awake, alert  and oriented  Airway & Oxygen Therapy: Patient Spontanous Breathing and Patient connected to face mask oxygen  Post-op Assessment: Report given to RN and Post -op Vital signs reviewed and stable  Post vital signs: Reviewed and stable  Last Vitals:  Vitals:   12/18/16 0640 12/18/16 1012  BP: (!) 144/86   Pulse:    Resp:    Temp:  (!) (P) 35.6 C    Last Pain:  Vitals:   12/18/16 0653  PainSc: 4          Complications: No apparent anesthesia complications

## 2016-12-18 NOTE — Anesthesia Procedure Notes (Signed)
Spinal  Patient location during procedure: OR Start time: 12/18/2016 8:30 AM End time: 12/18/2016 8:37 AM Staffing Resident/CRNA: Gean Maidens Performed: resident/CRNA  Preanesthetic Checklist Completed: patient identified, site marked, surgical consent, pre-op evaluation, timeout performed, IV checked, risks and benefits discussed and monitors and equipment checked Spinal Block Patient position: sitting Prep: Betadine Patient monitoring: heart rate, continuous pulse ox and blood pressure Approach: midline Location: L4-5 Injection technique: single-shot Needle Needle type: Spinocan  Needle gauge: 22 G Needle length: 9 cm Needle insertion depth: 6 cm Additional Notes Pt sitting position, sterile prep and drape, negative paresthesia, negative heme.

## 2016-12-18 NOTE — Interval H&P Note (Signed)
History and Physical Interval Note:  12/18/2016 8:14 AM  Brittany Shaffer  has presented today for surgery, with the diagnosis of Osteoarthritis right hip  The various methods of treatment have been discussed with the patient and family. After consideration of risks, benefits and other options for treatment, the patient has consented to  Procedure(s): RIGHT TOTAL HIP ARTHROPLASTY ANTERIOR APPROACH (Right) as a surgical intervention .  The patient's history has been reviewed, patient examined, no change in status, stable for surgery.  I have reviewed the patient's chart and labs.  Questions were answered to the patient's satisfaction.     [name]

## 2016-12-18 NOTE — Op Note (Signed)
OPERATIVE REPORT- TOTAL HIP ARTHROPLASTY   PREOPERATIVE DIAGNOSIS: Osteoarthritis of the Right hip.   POSTOPERATIVE DIAGNOSIS: Osteoarthritis of the Right  hip.   PROCEDURE: Right total hip arthroplasty, anterior approach.   SURGEON: Gaynelle Arabian, MD   ASSISTANT: Arlee Muslim, PA-C  ANESTHESIA:  Spinal  ESTIMATED BLOOD LOSS:-450 ml    DRAINS: Hemovac x1.   COMPLICATIONS: None   CONDITION: PACU - hemodynamically stable.   BRIEF CLINICAL NOTE: Brittany Shaffer is a 71 y.o. female who has advanced end-  stage arthritis of their Right  hip with progressively worsening pain and  dysfunction.The patient has failed nonoperative management and presents for  total hip arthroplasty.   PROCEDURE IN DETAIL: After successful administration of spinal  anesthetic, the traction boots for the [facility] bed were placed on both  feet and the patient was placed onto the [facility] bed, boots placed into the leg  holders. The Right hip was then isolated from the perineum with plastic  drapes and prepped and draped in the usual sterile fashion. ASIS and  greater trochanter were marked and a oblique incision was made, starting  at about 1 cm lateral and 2 cm distal to the ASIS and coursing towards  the anterior cortex of the femur. The skin was cut with a 10 blade  through subcutaneous tissue to the level of the fascia overlying the  tensor fascia lata muscle. The fascia was then incised in line with the  incision at the junction of the anterior third and posterior 2/3rd. The  muscle was teased off the fascia and then the interval between the TFL  and the rectus was developed. The Hohmann retractor was then placed at  the top of the femoral neck over the capsule. The vessels overlying the  capsule were cauterized and the fat on top of the capsule was removed.  A Hohmann retractor was then placed anterior underneath the rectus  femoris to give exposure to the entire anterior capsule. A T-shaped   capsulotomy was performed. The edges were tagged and the femoral head  was identified.       Osteophytes are removed off the superior acetabulum.  The femoral neck was then cut in situ with an oscillating saw. Traction  was then applied to the left lower extremity utilizing the [facility]  traction. The femoral head was then removed. Retractors were placed  around the acetabulum and then circumferential removal of the labrum was  performed. Osteophytes were also removed. Reaming starts at 45 mm to  medialize and  Increased in 2 mm increments to 49 mm. We reamed in  approximately 40 degrees of abduction, 20 degrees anteversion. A 50 mm  pinnacle acetabular shell was then impacted in anatomic position under  fluoroscopic guidance with excellent purchase. We did not need to place  any additional dome screws. A 32 mm neutral + 4 marathon liner was then  placed into the acetabular shell.       The femoral lift was then placed along the lateral aspect of the femur  just distal to the vastus ridge. The leg was  externally rotated and capsule  was stripped off the inferior aspect of the femoral neck down to the  level of the lesser trochanter, this was done with electrocautery. The femur was lifted after this was performed. The  leg was then placed in an extended and adducted position essentially delivering the femur. We also removed the capsule superiorly and the piriformis from the piriformis  fossa to gain excellent exposure of the  proximal femur. Rongeur was used to remove some cancellous bone to get  into the lateral portion of the proximal femur for placement of the  initial starter reamer. The starter broaches was placed  the starter broach  and was shown to go down the center of the canal. Broaching  with the  Corail system was then performed starting at size 8, coursing  Up to size 12. A size 12 had excellent torsional and rotational  and axial stability. The trial standard offset neck was then  placed  with a 32 + 5 trial head. The hip was then reduced. We confirmed that  the stem was in the canal both on AP and lateral x-rays. It also has excellent sizing. The hip was reduced with outstanding stability through full extension and full external rotation.. AP pelvis was taken and the leg lengths were measured and found to be equal. Hip was then dislocated again and the femoral head and neck removed. The  femoral broach was removed. Size 12 Corail stem with a standard offset  neck was then impacted into the femur following native anteversion. Has  excellent purchase in the canal. Excellent torsional and rotational and  axial stability. It is confirmed to be in the canal on AP and lateral  fluoroscopic views. The 32 + 5 ceramic head was placed and the hip  reduced with outstanding stability. Again AP pelvis was taken and it  confirmed that the leg lengths were equal. The wound was then copiously  irrigated with saline solution and the capsule reattached and repaired  with Ethibond suture. 30 ml of .25% Bupivicaine was  injected into the capsule and into the edge of the tensor fascia lata as well as subcutaneous tissue. The fascia overlying the tensor fascia lata was then closed with a running #1 V-Loc. Subcu was closed with interrupted 2-0 Vicryl and subcuticular running 4-0 Monocryl. Incision was cleaned  and dried. Steri-Strips and a bulky sterile dressing applied. Hemovac  drain was hooked to suction and then the patient was awakened and transported to  recovery in stable condition.        Please note that a surgical assistant was a medical necessity for this procedure to perform it in a safe and expeditious manner. Assistant was necessary to provide appropriate retraction of vital neurovascular structures and to prevent femoral fracture and allow for anatomic placement of the prosthesis.  Gaynelle Arabian, M.D.

## 2016-12-18 NOTE — Anesthesia Preprocedure Evaluation (Signed)
Anesthesia Evaluation  Patient identified by MRN, date of birth, ID band Patient awake    Reviewed: Allergy & Precautions, NPO status , Patient's Chart, lab work & pertinent test results  Airway Mallampati: II  TM Distance: >3 FB Neck ROM: Full    Dental  (+) Teeth Intact, Dental Advisory Given   Pulmonary    breath sounds clear to auscultation       Cardiovascular  Rhythm:Regular Rate:Normal     Neuro/Psych    GI/Hepatic   Endo/Other    Renal/GU      Musculoskeletal   Abdominal   Peds  Hematology   Anesthesia Other Findings   Reproductive/Obstetrics                             Anesthesia Physical Anesthesia Plan  ASA: II  Anesthesia Plan: Spinal   Post-op Pain Management:    Induction: Intravenous  Airway Management Planned: Simple Face Mask and Natural Airway  Additional Equipment:   Intra-op Plan:   Post-operative Plan:   Informed Consent: I have reviewed the patients History and Physical, chart, labs and discussed the procedure including the risks, benefits and alternatives for the proposed anesthesia with the patient or authorized representative who has indicated his/her understanding and acceptance.   Dental advisory given  Plan Discussed with: CRNA and Anesthesiologist  Anesthesia Plan Comments:         Anesthesia Quick Evaluation

## 2016-12-18 NOTE — Anesthesia Postprocedure Evaluation (Addendum)
Anesthesia Post Note  Patient: Brittany Shaffer  Procedure(s) Performed: Procedure(s) (LRB): RIGHT TOTAL HIP ARTHROPLASTY ANTERIOR APPROACH (Right)  Patient location during evaluation: PACU Anesthesia Type: Spinal Level of consciousness: awake, awake and alert and oriented Pain management: pain level controlled Vital Signs Assessment: post-procedure vital signs reviewed and stable Respiratory status: spontaneous breathing, nonlabored ventilation and respiratory function stable Cardiovascular status: blood pressure returned to baseline Postop Assessment: spinal receding Anesthetic complications: no       Last Vitals:  Vitals:   12/18/16 1545 12/18/16 1759  BP: 104/70 121/67  Pulse: 70 62  Resp: 16 16  Temp: 36.5 C 36.5 C    Last Pain:  Vitals:   12/18/16 1759  TempSrc: Oral  PainSc:                  Kaden Daughdrill COKER

## 2016-12-18 NOTE — H&P (View-Only) (Signed)
Brittany Shaffer DOB: 08-14-1946 Married / Language: English / Race: White Female Date of Admission:  12/18/2016 CC:  Right hip pain History of Present Illness  The patient is a 71 year old female who comes in for a preoperative History and Physical. The patient is scheduled for a right total hip arthroplasty (anterior) to be performed by Dr. Dione Plover. Aluisio, MD at [facility] on 12-18-2016. The patient is being followed for their right hip pain and osteoarthritis. They are year(s) out from when symptoms began. Symptoms reported include: pain (posterior pain(buttock), mild groin pain). The patient feels that they are doing poorly and report their pain level to be moderate (pain varies when active(bending right hip)). The following medication has been used for pain control: antiinflammatory medication. She does see some relith etodolac. She has minimal groin pain. Most of her pain is located in the buttocks. She does have some leg pain at night but denies sharp, shooting pain in the legs. She does have a history of lumbar DDD and has some right sided low back pain. No numbness or tingling in the legs. The patient comes in today 4 years out from left total hip arthroplasty. The patient states that she is doing very well at this time. The pain is under excellent control at this time and describes their pain as no pain. The patient is currently doing home exercise program. The patient feels that they are progressing well at this time. She has not had any issues with the left hip. She has returned to an active lifestyle. She plays tennis, pickle ball, and walks. The right hip appears to be her biggest problem at this time. She has pain in the buttock area and lateral hip occasionally radiating to her groin. She notes more functional issues with the hip in a harder time getting around. She feels like she is lost a lot of motion also. Hip is hurting her with most activities as well as at rest. She is not  having lower extremity weakness or paresthesia with this. She does have pain at night at times. In addition, she has developed pain in her left knee. The knee pain is posterior and posterior medial. She has not had any recent injury leading to this. Especially bothers her getting up and down or doing stairs. Bending and squatting is difficult. Her left hip which we replaced a few years ago is doing great at this time and she is very pleased with that. Her right knee is not symptomatic at this time. With regards to the left knee she is not getting swelling or mechanical symptoms. She is ready to proceed with surgery for the right hip at this time. They have been treated conservatively in the past for the above stated problem and despite conservative measures, they continue to have progressive pain and severe functional limitations and dysfunction. They have failed non-operative management including home exercise, medications. It is felt that they would benefit from undergoing total joint replacement. Risks and benefits of the procedure have been discussed with the patient and they elect to proceed with surgery. There are no active contraindications to surgery such as ongoing infection or rapidly progressive neurological disease.  Problem List/Past Medical  Patellofemoral arthritis (M17.10)  Pain of right heel (M79.671)  Status post left hip replacement (P29.518)  Primary osteoarthritis of left knee (M17.12)  Primary osteoarthritis of right hip (M16.11)  Plantar fascial fibromatosis of right foot (M72.2)  Aftercare following left hip joint replacement surgery (Z47.1)  Chronic Pain  Heart murmur  Hypercholesterolemia  Osteoarthritis  Tinnitus  Varicose veins   Allergies  No Known Drug Allergies  Intolerances Codeine/Codeine Derivatives  intolerance  Family History Cancer  First Degree Relatives. mother, father and sister Hypertension  mother  Social History Tobacco use   Never smoker. 11/20/2015 never smoker Tobacco / smoke exposure  no Previously in rehab  no Pain Contract  no Drug/Alcohol Rehab (Currently)  no Alcohol use  current drinker; drinks beer, wine and hard liquor; 5-7 per week Current work status  retired Children  2 Illicit drug use  no Post-Surgical Plans  Plan is to go home. Marital status  married Exercise  Exercises daily; does running / walking and team sport Living situation  live with spouse Number of flights of stairs before winded  greater than 5 Advance Directives  Living Will, Healthcare POA  Medication History Etodolac (500MG  Tablet, 1 (one) Tablet Oral two times daily, Taken starting 04/04/2016) Active. Leg Cramp Relief (Oral) Active. Voltaren (1% Gel, Transdermal) Active. (prn when she plays tennis) Ambien (Oral) Specific strength unknown - Active. (only on occasion) Estradiol (0.025MG /24HR Patch Weekly, Transdermal) Active.   Past Surgical History  Tubal Ligation  Hysterectomy (not due to cancer) - Complete  Post-op complications. incontinence, which has resolved Total Hip Replacement - Left  Date: 03/2013.   Review of Systems General Not Present- Chills, Fatigue, Fever, Memory Loss, Night Sweats, Weight Gain and Weight Loss. Skin Not Present- Eczema, Hives, Itching, Lesions and Rash. HEENT Present- Tinnitus. Not Present- Dentures, Double Vision, Headache, Hearing Loss and Visual Loss. Respiratory Not Present- Allergies, Chronic Cough, Coughing up blood, Shortness of breath at rest and Shortness of breath with exertion. Cardiovascular Not Present- Chest Pain, Difficulty Breathing Lying Down, Murmur, Palpitations, Racing/skipping heartbeats and Swelling. Gastrointestinal Not Present- Abdominal Pain, Bloody Stool, Constipation, Diarrhea, Difficulty Swallowing, Heartburn, Jaundice, Loss of appetitie, Nausea and Vomiting. Female Genitourinary Not Present- Blood in Urine, Discharge, Flank Pain,  Incontinence, Painful Urination, Urgency, Urinary frequency, Urinary Retention, Urinating at Night and Weak urinary stream. Musculoskeletal Present- Joint Pain and Morning Stiffness. Not Present- Back Pain, Joint Swelling, Muscle Pain, Muscle Weakness and Spasms. Neurological Not Present- Blackout spells, Difficulty with balance, Dizziness, Paralysis, Tremor and Weakness. Psychiatric Not Present- Insomnia.  Vitals Weight: 153 lb Height: 67.5in Body Surface Area: 1.81 m Body Mass Index: 23.61 kg/m  Pulse: 56 (Regular)  BP: 142/86 (Sitting, Left Arm, Standard)   Physical Exam General Mental Status -Alert, cooperative and good historian. General Appearance-pleasant, Not in acute distress. Orientation-Oriented X3. Build & Nutrition-Well nourished and Well developed.  Head and Neck Head-normocephalic, atraumatic . Neck Global Assessment - supple, no bruit auscultated on the right, no bruit auscultated on the left.  Eye Pupil - Bilateral-Regular and Round. Motion - Bilateral-EOMI.  Chest and Lung Exam Auscultation Breath sounds - clear at anterior chest wall and clear at posterior chest wall. Adventitious sounds - No Adventitious sounds.  Cardiovascular Auscultation Rhythm - Regular rate and rhythm. Heart Sounds - S1 WNL and S2 WNL. Murmurs & Other Heart Sounds: Murmur 1 - Location - Aortic Area, Right Carotid and Sternal Border - Left. Timing - Late systolic. Grade - II/VI. Character - Crescendo.  Abdomen Palpation/Percussion Tenderness - Abdomen is non-tender to palpation. Rigidity (guarding) - Abdomen is soft. Auscultation Auscultation of the abdomen reveals - Bowel sounds normal.  Female Genitourinary Note: Not done, not pertinent to present illness   Musculoskeletal Note: On exam, well-developed female, alert and oriented, in no apparent distress.  Left hip can be flexed to 130, rotated in 30, out 40, abduct 40 without discomfort. Right hip  flex to 100, no internal rotation, about 20 external rotation, 20 abduction with discomfort. Her knees show no effusion. Right knee 0 to 135. No tenderness or instability. There is minimal crepitus on range of motion. Left knee, moderate crepitus on range of motion, range 5 to 135. Some tenderness medial greater than lateral and no instability noted. Pulse, sensation, motor intact distally. Gait pattern significantly antalgic on right with an abductor lurch.  Her radiographs, AP pelvis and lateral of the hip show the prosthesis on the left in excellent position with no periprosthetic abnormalities. She also had an AP of that hip. On the right, she has got bone-on-bone arthritis throughout with subchondral cystic formation. AP and lateral of the knees show that the right knee no evidence of any arthritis. Left knee medial joint space narrowing and patellofemoral narrowing.  Assessment & Plan Primary osteoarthritis of right hip (M16.11)  Note:Surgical Plans: Right Total Hip Replacement - Anterior Approach  Disposition: Home with daughter  PCP: Dr. Myriam Jacobson - Patient has been seen preoperatively and felt to be stable for surgery.  IV TXA  Anesthesia Issues: None  Patient was instructed on what medications to stop prior to surgery.  Signed electronically by Joelene Millin, III PA-C

## 2016-12-19 LAB — CBC
HEMATOCRIT: 32.4 % — AB (ref 36.0–46.0)
HEMOGLOBIN: 11 g/dL — AB (ref 12.0–15.0)
MCH: 33.1 pg (ref 26.0–34.0)
MCHC: 34 g/dL (ref 30.0–36.0)
MCV: 97.6 fL (ref 78.0–100.0)
Platelets: 182 10*3/uL (ref 150–400)
RBC: 3.32 MIL/uL — ABNORMAL LOW (ref 3.87–5.11)
RDW: 12.9 % (ref 11.5–15.5)
WBC: 11 10*3/uL — AB (ref 4.0–10.5)

## 2016-12-19 LAB — BASIC METABOLIC PANEL
Anion gap: 7 (ref 5–15)
BUN: 16 mg/dL (ref 6–20)
CHLORIDE: 105 mmol/L (ref 101–111)
CO2: 27 mmol/L (ref 22–32)
Calcium: 8.5 mg/dL — ABNORMAL LOW (ref 8.9–10.3)
Creatinine, Ser: 0.69 mg/dL (ref 0.44–1.00)
GFR calc non Af Amer: >60 mL/min (ref >60)
Glucose, Bld: 129 mg/dL — ABNORMAL HIGH (ref 65–99)
POTASSIUM: 4.2 mmol/L (ref 3.5–5.1)
Sodium: 139 mmol/L (ref 135–145)

## 2016-12-19 MED ORDER — METHOCARBAMOL 500 MG PO TABS: 500.0000 mg | ORAL_TABLET | Freq: Four times a day (QID) | ORAL | 0 refills | Status: DC | PRN

## 2016-12-19 MED ORDER — RIVAROXABAN 10 MG PO TABS: 10.0000 mg | ORAL_TABLET | Freq: Every day | ORAL | 0 refills | Status: DC

## 2016-12-19 MED ORDER — HYDROMORPHONE HCL 2 MG PO TABS: 2.0000 mg | ORAL_TABLET | ORAL | 0 refills | Status: DC | PRN

## 2016-12-19 MED ORDER — TRAMADOL HCL 50 MG PO TABS: 50.0000 mg | ORAL_TABLET | Freq: Four times a day (QID) | ORAL | 0 refills | Status: DC | PRN

## 2016-12-19 NOTE — Discharge Summary (Signed)
Physician Discharge Summary   Patient ID: Brittany Shaffer MRN: [id number] DOB/AGE: [birth year] 71 y.o.  Admit date: 12/18/2016 Discharge date: 12-19-2016  Primary Diagnosis:  Osteoarthritis of the Right hip.   Admission Diagnoses:  Past Medical History:  Diagnosis Date  . Arthritis   . Atrial fibrillation ([place])   . Heart murmur   . Leg cramps   . Murmur, heart    Discharge Diagnoses:   Active Problems:   OA (osteoarthritis) of hip  Estimated body mass index is 23.65 kg/m as calculated from the following:   Height as of this encounter: '5\' 7"'  (1.702 m).   Weight as of this encounter: 68.5 kg (151 lb).  Procedure(s) (LRB): RIGHT TOTAL HIP ARTHROPLASTY ANTERIOR APPROACH (Right)   Consults: None  HPI: Brittany Shaffer is a 71 y.o. female who has advanced end-  stage arthritis of their Right  hip with progressively worsening pain and  dysfunction.The patient has failed nonoperative management and presents for  total hip arthroplasty.   Laboratory Data: Admission on 12/18/2016  Component Date Value Ref Range Status  . WBC 12/19/2016 11.0* 4.0 - 10.5 K/uL Final  . RBC 12/19/2016 3.32* 3.87 - 5.11 MIL/uL Final  . Hemoglobin 12/19/2016 11.0* 12.0 - 15.0 g/dL Final  . HCT 12/19/2016 32.4* 36.0 - 46.0 % Final  . MCV 12/19/2016 97.6  78.0 - 100.0 fL Final  . MCH 12/19/2016 33.1  26.0 - 34.0 pg Final  . MCHC 12/19/2016 34.0  30.0 - 36.0 g/dL Final  . RDW 12/19/2016 12.9  11.5 - 15.5 % Final  . Platelets 12/19/2016 182  150 - 400 K/uL Final  . Sodium 12/19/2016 139  135 - 145 mmol/L Final  . Potassium 12/19/2016 4.2  3.5 - 5.1 mmol/L Final  . Chloride 12/19/2016 105  101 - 111 mmol/L Final  . CO2 12/19/2016 27  22 - 32 mmol/L Final  . Glucose, Bld 12/19/2016 129* 65 - 99 mg/dL Final  . BUN 12/19/2016 16  6 - 20 mg/dL Final  . Creatinine, Ser 12/19/2016 0.69  0.44 - 1.00 mg/dL Final  . Calcium 12/19/2016 8.5* 8.9 - 10.3 mg/dL Final  . GFR calc non Af Amer 12/19/2016 >60  >60 mL/min  Final  . GFR calc Af Amer 12/19/2016 >60  >60 mL/min Final   Comment: (NOTE) The eGFR has been calculated using the CKD EPI equation. This calculation has not been validated in all clinical situations. eGFR's persistently <60 mL/min signify possible Chronic Kidney Disease.   Georgiann Hahn gap 12/19/2016 7  5 - 15 Final  Hospital Outpatient Visit on 12/12/2016  Component Date Value Ref Range Status  . aPTT 12/12/2016 31  24 - 36 seconds Final  . WBC 12/12/2016 5.5  4.0 - 10.5 K/uL Final  . RBC 12/12/2016 4.12  3.87 - 5.11 MIL/uL Final  . Hemoglobin 12/12/2016 13.7  12.0 - 15.0 g/dL Final  . HCT 12/12/2016 40.8  36.0 - 46.0 % Final  . MCV 12/12/2016 99.0  78.0 - 100.0 fL Final  . MCH 12/12/2016 33.3  26.0 - 34.0 pg Final  . MCHC 12/12/2016 33.6  30.0 - 36.0 g/dL Final  . RDW 12/12/2016 13.1  11.5 - 15.5 % Final  . Platelets 12/12/2016 252  150 - 400 K/uL Final  . Sodium 12/12/2016 140  135 - 145 mmol/L Final  . Potassium 12/12/2016 4.9  3.5 - 5.1 mmol/L Final  . Chloride 12/12/2016 105  101 - 111 mmol/L Final  . CO2 12/12/2016  28  22 - 32 mmol/L Final  . Glucose, Bld 12/12/2016 101* 65 - 99 mg/dL Final  . BUN 12/12/2016 20  6 - 20 mg/dL Final  . Creatinine, Ser 12/12/2016 0.85  0.44 - 1.00 mg/dL Final  . Calcium 12/12/2016 9.4  8.9 - 10.3 mg/dL Final  . Total Protein 12/12/2016 7.3  6.5 - 8.1 g/dL Final  . Albumin 12/12/2016 4.2  3.5 - 5.0 g/dL Final  . AST 12/12/2016 25  15 - 41 U/L Final  . ALT 12/12/2016 14  14 - 54 U/L Final  . Alkaline Phosphatase 12/12/2016 67  38 - 126 U/L Final  . Total Bilirubin 12/12/2016 0.6  0.3 - 1.2 mg/dL Final  . GFR calc non Af Amer 12/12/2016 >60  >60 mL/min Final  . GFR calc Af Amer 12/12/2016 >60  >60 mL/min Final   Comment: (NOTE) The eGFR has been calculated using the CKD EPI equation. This calculation has not been validated in all clinical situations. eGFR's persistently <60 mL/min signify possible Chronic Kidney Disease.   . Anion gap  12/12/2016 7  5 - 15 Final  . Prothrombin Time 12/12/2016 13.0  11.4 - 15.2 seconds Final  . INR 12/12/2016 0.98   Final  . ABO/RH(D) 12/12/2016 O POS   Final  . Antibody Screen 12/12/2016 NEG   Final  . Sample Expiration 12/12/2016 12/21/2016   Final  . Extend sample reason 12/12/2016 NO TRANSFUSIONS OR PREGNANCY IN THE PAST 3 MONTHS   Final  . MRSA, PCR 12/12/2016 NEGATIVE  NEGATIVE Final  . Staphylococcus aureus 12/12/2016 NEGATIVE  NEGATIVE Final   Comment:        The Xpert SA Assay (FDA approved for NASAL specimens in patients over 59 years of age), is one component of a comprehensive surveillance program.  Test performance has been validated by [facility] for patients greater than or equal to 22 year old. It is not intended to diagnose infection nor to guide or monitor treatment.      X-Rays:Dg Pelvis Portable  Result Date: 12/18/2016 CLINICAL DATA:  Status post right hip joint replacement today. EXAM: PORTABLE PELVIS 1-2 VIEWS COMPARISON:  2 portable AP views of the pelvis and hips are reviewed. A previous study of March 24, 2013 is also reviewed. FINDINGS: There has been interval placement of a prosthetic right hip joint. Radiographic positioning of the prosthetic components is good. The interface with the native bone appears normal. Surgical drain lines are present. The pre-existing left prosthetic hip joint is stable. IMPRESSION: No immediate postprocedure complication following right total hip joint prosthesis placement. Electronically Signed   By: David  [name] M.D.   On: 12/18/2016 11:31   Dg C-arm 1-60 Min-no Report  Result Date: 12/18/2016 Fluoroscopy was utilized by the requesting physician.  No radiographic interpretation.    EKG: Orders placed or performed during the hospital encounter of 03/24/13  . EKG     Hospital Course: Patient was admitted to [facility] and taken to the OR and underwent the above state procedure without complications.  Patient  tolerated the procedure well and was later transferred to the recovery room and then to the orthopaedic floor for postoperative care.  They were given PO and IV analgesics for pain control following their surgery.  They were given 24 hours of postoperative antibiotics of  Anti-infectives    Start     Dose/Rate Route Frequency Ordered Stop   12/18/16 2245  ceFAZolin (ANCEF) IVPB 2g/100 mL premix  2 g 200 mL/hr over 30 Minutes Intravenous  Once 12/18/16 2236 12/19/16 0039   12/18/16 1500  ceFAZolin (ANCEF) IVPB 2g/100 mL premix     2 g 200 mL/hr over 30 Minutes Intravenous Every 6 hours 12/18/16 1300 12/18/16 2130   12/18/16 0704  ceFAZolin (ANCEF) 2-4 GM/100ML-% IVPB    Comments:  Riling, Sonya   : cabinet override      12/18/16 0704 12/18/16 0852   12/18/16 0641  ceFAZolin (ANCEF) IVPB 2g/100 mL premix     2 g 200 mL/hr over 30 Minutes Intravenous On call to O.R. 12/18/16 4098 12/18/16 1191     and started on DVT prophylaxis in the form of Xarelto.   PT and OT were ordered for total hip protocol.  The patient was allowed to be WBAT with therapy. Discharge planning was consulted to help with postop disposition and equipment needs.  Patient had a good night on the evening of surgery.  They started to get up OOB with therapy on day one.  Hemovac drain was pulled without difficulty.  Dressing was checked and was clean and dry.  Patient was seen in rounds by Dr. Wynelle Link and was setup to go home later that same day after two sessions of therapy and meeting goals.  Diet: Regular diet Activity:WBAT Follow-up:in 2 weeks Disposition - Home Discharged Condition: stable   Discharge Instructions    Call MD / Call 911    Complete by:  As directed    If you experience chest pain or shortness of breath, CALL 911 and be transported to the hospital emergency room.  If you develope a fever above 101 F, pus (white drainage) or increased drainage or redness at the wound, or calf pain, call your surgeon's  office.   Change dressing    Complete by:  As directed    You may change your dressing dressing daily with sterile 4 x 4 inch gauze dressing and paper tape.  Do not submerge the incision under water.   Constipation Prevention    Complete by:  As directed    Drink plenty of fluids.  Prune juice may be helpful.  You may use a stool softener, such as Colace (over the counter) 100 mg twice a day.  Use MiraLax (over the counter) for constipation as needed.   Diet general    Complete by:  As directed    Discharge instructions    Complete by:  As directed    Pick up stool softner and laxative for home use following surgery while on pain medications. Do not submerge incision under water. Please use good hand washing techniques while changing dressing each day. May shower starting three days after surgery. Please use a clean towel to pat the incision dry following showers. Continue to use ice for pain and swelling after surgery. Do not use any lotions or creams on the incision until instructed by your surgeon.  Wear both TED hose on both legs during the day every day for three weeks, but may have off at night at home.  Postoperative Constipation Protocol  Constipation - defined medically as fewer than three stools per week and severe constipation as less than one stool per week.  One of the most common issues patients have following surgery is constipation.  Even if you have a regular bowel pattern at home, your normal regimen is likely to be disrupted due to multiple reasons following surgery.  Combination of anesthesia, postoperative narcotics, change in appetite and fluid  intake all can affect your bowels.  In order to avoid complications following surgery, here are some recommendations in order to help you during your recovery period.  Colace (docusate) - Pick up an over-the-counter form of Colace or another stool softener and take twice a day as long as you are requiring postoperative pain  medications.  Take with a full glass of water daily.  If you experience loose stools or diarrhea, hold the colace until you stool forms back up.  If your symptoms do not get better within 1 week or if they get worse, check with your doctor.  Dulcolax (bisacodyl) - Pick up over-the-counter and take as directed by the product packaging as needed to assist with the movement of your bowels.  Take with a full glass of water.  Use this product as needed if not relieved by Colace only.   MiraLax (polyethylene glycol) - Pick up over-the-counter to have on hand.  MiraLax is a solution that will increase the amount of water in your bowels to assist with bowel movements.  Take as directed and can mix with a glass of water, juice, soda, coffee, or tea.  Take if you go more than two days without a movement. Do not use MiraLax more than once per day. Call your doctor if you are still constipated or irregular after using this medication for 7 days in a row.  If you continue to have problems with postoperative constipation, please contact the office for further assistance and recommendations.  If you experience "the worst abdominal pain ever" or develop nausea or vomiting, please contact the office immediatly for further recommendations for treatment.   Take Xarelto for two and a half more weeks, then discontinue Xarelto. Once the patient has completed the blood thinner regimen, then take a Baby 81 mg Aspirin daily for three more weeks.    Do not sit on low chairs, stoools or toilet seats, as it may be difficult to get up from low surfaces    Complete by:  As directed    Driving restrictions    Complete by:  As directed    No driving until released by the physician.   Increase activity slowly as tolerated    Complete by:  As directed    Lifting restrictions    Complete by:  As directed    No lifting until released by the physician.   Patient may shower    Complete by:  As directed    You may shower without  a dressing once there is no drainage.  Do not wash over the wound.  If drainage remains, do not shower until drainage stops.   TED hose    Complete by:  As directed    Use stockings (TED hose) for 3 weeks on both leg(s).  You may remove them at night for sleeping.   Weight bearing as tolerated    Complete by:  As directed    Laterality:  right   Extremity:  Lower     Allergies as of 12/19/2016      Reactions   Codeine    Hallucinations      Medication List    STOP taking these medications   diclofenac sodium 1 % Gel Commonly known as:  VOLTAREN   estradiol 0.025 mg/24hr patch Commonly known as:  CLIMARA - Dosed in mg/24 hr   etodolac 500 MG tablet Commonly known as:  LODINE   LEG CRAMP RELIEF PO     TAKE these  medications   acetaminophen 500 MG tablet Commonly known as:  TYLENOL Take 1,000 mg by mouth 2 (two) times daily as needed (for pain.).   HYDROmorphone 2 MG tablet Commonly known as:  DILAUDID Take 1 tablet (2 mg total) by mouth every 4 (four) hours as needed for moderate pain or severe pain.   methocarbamol 500 MG tablet Commonly known as:  ROBAXIN Take 1 tablet (500 mg total) by mouth every 6 (six) hours as needed for muscle spasms.   promethazine-dextromethorphan 6.25-15 MG/5ML syrup Commonly known as:  PROMETHAZINE-DM Take 2.5-5 mLs by mouth 4 (four) times daily as needed for cough.   rivaroxaban 10 MG Tabs tablet Commonly known as:  XARELTO Take 1 tablet (10 mg total) by mouth daily with breakfast. Take Xarelto for two and a half more weeks following discharge from the hospital, then discontinue Xarelto. Once the patient has completed the blood thinner regimen, then take a Baby 81 mg Aspirin daily for three more weeks.   traMADol 50 MG tablet Commonly known as:  ULTRAM Take 1-2 tablets (50-100 mg total) by mouth every 6 (six) hours as needed for moderate pain.   zolpidem 10 MG tablet Commonly known as:  AMBIEN Take 1 tablet (10 mg total) by mouth at  bedtime as needed. For sleep      Follow-up Information    [name], MD. Schedule an appointment as soon as possible for a visit on 12/31/2016.   Specialty:  Orthopedic Surgery Contact information: [address] [place] [address] [phone]           Signed: Arlee Muslim, PA-C Orthopaedic Surgery 12/19/2016, 7:28 AM

## 2016-12-19 NOTE — Progress Notes (Signed)
Discharge plan:  Pt to DC with HHPT. Choice offered and [facility] chosen. [facility] rep made aware. Pt has no DME needs. Marney Doctor RN,BSN,NCM [phone]

## 2016-12-19 NOTE — Discharge Instructions (Addendum)
° °Dr. Frank Aluisio °Total Joint Specialist °[facility] °[address] °[place], [place] [address] °[phone] ° °ANTERIOR APPROACH TOTAL HIP REPLACEMENT POSTOPERATIVE DIRECTIONS ° ° °Hip Rehabilitation, Guidelines Following Surgery  °The results of a hip operation are greatly improved after range of motion and muscle strengthening exercises. Follow all safety measures which are given to protect your hip. If any of these exercises cause increased pain or swelling in your joint, decrease the amount until you are comfortable again. Then slowly increase the exercises. Call your caregiver if you have problems or questions.  ° °HOME CARE INSTRUCTIONS  °Remove items at home which could result in a fall. This includes throw rugs or furniture in walking pathways.  °· ICE to the affected hip every three hours for 30 minutes at a time and then as needed for pain and swelling.  Continue to use ice on the hip for pain and swelling from surgery. You may notice swelling that will progress down to the foot and ankle.  This is normal after surgery.  Elevate the leg when you are not up walking on it.   °· Continue to use the breathing machine which will help keep your temperature down.  It is common for your temperature to cycle up and down following surgery, especially at night when you are not up moving around and exerting yourself.  The breathing machine keeps your lungs expanded and your temperature down. ° ° °DIET °You may resume your previous home diet once your are discharged from the hospital. ° °DRESSING / WOUND CARE / SHOWERING °You may shower 3 days after surgery, but keep the wounds dry during showering.  You may use an occlusive plastic wrap (Press'n Seal for example), NO SOAKING/SUBMERGING IN THE BATHTUB.  If the bandage gets wet, change with a clean dry gauze.  If the incision gets wet, pat the wound dry with a clean towel. °You may start showering once you are discharged home but do not  submerge the incision under water. Just pat the incision dry and apply a dry gauze dressing on daily. °Change the surgical dressing daily and reapply a dry dressing each time. ° °ACTIVITY °Walk with your walker as instructed. °Use walker as long as suggested by your caregivers. °Avoid periods of inactivity such as sitting longer than an hour when not asleep. This helps prevent blood clots.  °You may resume a sexual relationship in one month or when given the OK by your doctor.  °You may return to work once you are cleared by your doctor.  °Do not drive a car for 6 weeks or until released by you surgeon.  °Do not drive while taking narcotics. ° °WEIGHT BEARING °Weight bearing as tolerated with assist device (walker, cane, etc) as directed, use it as long as suggested by your surgeon or therapist, typically at least 4-6 weeks. ° °POSTOPERATIVE CONSTIPATION PROTOCOL °Constipation - defined medically as fewer than three stools per week and severe constipation as less than one stool per week. ° °One of the most common issues patients have following surgery is constipation.  Even if you have a regular bowel pattern at home, your normal regimen is likely to be disrupted due to multiple reasons following surgery.  Combination of anesthesia, postoperative narcotics, change in appetite and fluid intake all can affect your bowels.  In order to avoid complications following surgery, here are some recommendations in order to help you during your recovery period. ° °Colace (docusate) - Pick up an over-the-counter   form of Colace or another stool softener and take twice a day as long as you are requiring postoperative pain medications.  Take with a full glass of water daily.  If you experience loose stools or diarrhea, hold the colace until you stool forms back up.  If your symptoms do not get better within 1 week or if they get worse, check with your doctor. ° °Dulcolax (bisacodyl) - Pick up over-the-counter and take as directed  by the product packaging as needed to assist with the movement of your bowels.  Take with a full glass of water.  Use this product as needed if not relieved by Colace only.  ° °MiraLax (polyethylene glycol) - Pick up over-the-counter to have on hand.  MiraLax is a solution that will increase the amount of water in your bowels to assist with bowel movements.  Take as directed and can mix with a glass of water, juice, soda, coffee, or tea.  Take if you go more than two days without a movement. °Do not use MiraLax more than once per day. Call your doctor if you are still constipated or irregular after using this medication for 7 days in a row. ° °If you continue to have problems with postoperative constipation, please contact the office for further assistance and recommendations.  If you experience "the worst abdominal pain ever" or develop nausea or vomiting, please contact the office immediatly for further recommendations for treatment. ° °ITCHING ° If you experience itching with your medications, try taking only a single pain pill, or even half a pain pill at a time.  You can also use Benadryl over the counter for itching or also to help with sleep.  ° °TED HOSE STOCKINGS °Wear the elastic stockings on both legs for three weeks following surgery during the day but you may remove then at night for sleeping. ° °MEDICATIONS °See your medication summary on the “After Visit Summary” that the nursing staff will review with you prior to discharge.  You may have some home medications which will be placed on hold until you complete the course of blood thinner medication.  It is important for you to complete the blood thinner medication as prescribed by your surgeon.  Continue your approved medications as instructed at time of discharge. ° °PRECAUTIONS °If you experience chest pain or shortness of breath - call 911 immediately for transfer to the hospital emergency department.  °If you develop a fever greater that 101 F,  purulent drainage from wound, increased redness or drainage from wound, foul odor from the wound/dressing, or calf pain - CONTACT YOUR SURGEON.   °                                                °FOLLOW-UP APPOINTMENTS °Make sure you keep all of your appointments after your operation with your surgeon and caregivers. You should call the office at the above phone number and make an appointment for approximately two weeks after the date of your surgery or on the date instructed by your surgeon outlined in the "After Visit Summary". ° °RANGE OF MOTION AND STRENGTHENING EXERCISES  °These exercises are designed to help you keep full movement of your hip joint. Follow your caregiver's or physical therapist's instructions. Perform all exercises about fifteen times, three times per day or as directed. Exercise both hips, even if you   have had only one joint replacement. These exercises can be done on a training (exercise) mat, on the floor, on a table or on a bed. Use whatever works the best and is most comfortable for you. Use music or television while you are exercising so that the exercises are a pleasant break in your day. This will make your life better with the exercises acting as a break in routine you can look forward to.  Lying on your back, slowly slide your foot toward your buttocks, raising your knee up off the floor. Then slowly slide your foot back down until your leg is straight again.  Lying on your back spread your legs as far apart as you can without causing discomfort.  Lying on your side, raise your upper leg and foot straight up from the floor as far as is comfortable. Slowly lower the leg and repeat.  Lying on your back, tighten up the muscle in the front of your thigh (quadriceps muscles). You can do this by keeping your leg straight and trying to raise your heel off the floor. This helps strengthen the largest muscle supporting your knee.  Lying on your back, tighten up the muscles of your  buttocks both with the legs straight and with the knee bent at a comfortable angle while keeping your heel on the floor.   IF YOU ARE TRANSFERRED TO A SKILLED REHAB FACILITY If the patient is transferred to a skilled rehab facility following release from the hospital, a list of the current medications will be sent to the facility for the patient to continue.  When discharged from the skilled rehab facility, please have the facility set up the patient's [place] prior to being released. Also, the skilled facility will be responsible for providing the patient with their medications at time of release from the facility to include their pain medication, the muscle relaxants, and their blood thinner medication. If the patient is still at the rehab facility at time of the two week follow up appointment, the skilled rehab facility will also need to assist the patient in arranging follow up appointment in our office and any transportation needs.  MAKE SURE YOU:  Understand these instructions.  Get help right away if you are not doing well or get worse.    Pick up stool softner and laxative for home use following surgery while on pain medications. Do not submerge incision under water. Please use good hand washing techniques while changing dressing each day. May shower starting three days after surgery. Please use a clean towel to pat the incision dry following showers. Continue to use ice for pain and swelling after surgery. Do not use any lotions or creams on the incision until instructed by your surgeon.  Take Xarelto for two and a half more weeks following discharge from the hospital, then discontinue Xarelto. Once the patient has completed the blood thinner regimen, then take a Baby 81 mg Aspirin daily for three more weeks.  Information on my medicine - XARELTO (Rivaroxaban)  This medication education was reviewed with me or my healthcare representative as part of my discharge  preparation.  The pharmacist that spoke with me during my hospital stay was:  Minda Ditto, [facility]  Why was Xarelto prescribed for you? Xarelto was prescribed for you to reduce the risk of blood clots forming after orthopedic surgery. The medical term for these abnormal blood clots is venous thromboembolism (VTE).  What do you need to know about xarelto ?  Take your Xarelto ONCE DAILY at the same time every day. You may take it either with or without food.  If you have difficulty swallowing the tablet whole, you may crush it and mix in applesauce just prior to taking your dose.  Take Xarelto exactly as prescribed by your doctor and DO NOT stop taking Xarelto without talking to the doctor who prescribed the medication.  Stopping without other VTE prevention medication to take the place of Xarelto may increase your risk of developing a clot.  After discharge, you should have regular check-up appointments with your healthcare provider that is prescribing your Xarelto.    What do you do if you miss a dose? If you miss a dose, take it as soon as you remember on the same day then continue your regularly scheduled once daily regimen the next day. Do not take two doses of Xarelto on the same day.   Important Safety Information A possible side effect of Xarelto is bleeding. You should call your healthcare provider right away if you experience any of the following: ? Bleeding from an injury or your nose that does not stop. ? Unusual colored urine (red or dark brown) or unusual colored stools (red or black). ? Unusual bruising for unknown reasons. ? A serious fall or if you hit your head (even if there is no bleeding).  Some medicines may interact with Xarelto and might increase your risk of bleeding while on Xarelto. To help avoid this, consult your healthcare provider or pharmacist prior to using any new prescription or non-prescription medications, including herbals, vitamins,  non-steroidal anti-inflammatory drugs (NSAIDs) and supplements.  Discussed holding Etodolac while on Xarelto, Voltaren gel occasionally is acceptable Have pharmacist check ingredients in Homeopathic med for leg cramps if needed to avoid interaction with Xarelto  This website has more information on Xarelto: [email or web address]

## 2016-12-19 NOTE — Progress Notes (Signed)
Pt to d/c home with [facility] No DME needs. AVS reviewed and "My Chart" discussed with pt. Prescriptions given. Pt capable of verbalizing medications, dressing changes, signs and symptoms of infection, and follow-up appointments. Remains hemodynamically stable. No signs and symptoms of distress. Educated pt to return to ER in the case of SOB, dizziness, or chest pain.

## 2016-12-19 NOTE — Progress Notes (Signed)
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   Follow Up Recommendations  Home health PT     Equipment Recommendations  None recommended by PT    Recommendations for Other Services       Precautions / Restrictions Precautions Precautions: Fall Restrictions Weight Bearing Restrictions: No Other Position/Activity Restrictions: WBAT    Mobility  Bed Mobility Overal bed mobility: Needs Assistance Bed Mobility: Supine to Sit     Supine to sit: Min assist     General bed mobility comments: OOB deferred to after dressing change and gown change 2* bleeading from drain site  Transfers Overall transfer level: Needs assistance Equipment used: Rolling walker (2 wheeled) Transfers: Sit to/from Stand Sit to Stand: Min assist         General transfer comment: cues for LE management and use of UEs to self assist  Ambulation/Gait Ambulation/Gait assistance: Min assist;Min guard Ambulation Distance (Feet): 150 Feet Assistive device: Rolling walker (2 wheeled) Gait Pattern/deviations: Step-to pattern;Step-through pattern;Decreased step length - right;Decreased step length - left;Shuffle;Trunk flexed Gait velocity: decr Gait velocity interpretation: Below normal speed for age/gender General Gait Details: cues for posture, position from RW and initial sequence   Stairs            Wheelchair Mobility    Modified Rankin (Stroke Patients Only)       Balance Overall balance assessment: Independent                                          Cognition Arousal/Alertness: Awake/alert Behavior During Therapy: WFL for tasks assessed/performed Overall Cognitive Status: Within Functional Limits for tasks assessed                                         Exercises      General Comments        Pertinent Vitals/Pain Pain Assessment: 0-10 Pain Score: 3  Pain Location: R hip Pain Descriptors / Indicators: Aching;Sore Pain Intervention(s): Limited activity within patient's tolerance;Monitored during session;Premedicated before session    [place] expects to be discharged to:: Private residence Living Arrangements: Spouse/significant other;Children Available Help at Discharge: Family (dtr will assist until 4/24) Type of Home: House Home Access: Stairs to enter Entrance Stairs-Rails: None Home Layout: One level Home Equipment: Environmental consultant - 4 wheels Additional Comments: rails and seat in shower    Prior Function Level of Independence: Independent          PT Goals (current goals can now be found in the care plan section) Acute Rehab PT Goals Patient Stated Goal: regain IND ASAP PT Goal Formulation: With patient Time For Goal Achievement: 12/21/16 Potential to Achieve Goals: Good Progress towards PT goals: Progressing toward goals    Frequency    7X/week      PT Plan Current plan remains appropriate    Co-evaluation             End of Session   Activity Tolerance: Patient tolerated treatment well Patient left: in chair;with call [place]/phone within reach Nurse  Communication: Mobility status PT Visit Diagnosis: Unsteadiness on feet (R26.81);Difficulty in walking, not elsewhere classified (R26.2)     Time: 6153-7943 PT Time Calculation (min) (ACUTE ONLY): 14 min  Charges:  $Gait Training: 8-22 mins                    G Codes:       Pg [phone]    Geri Hepler 12/19/2016, 12:34 PM

## 2016-12-19 NOTE — Progress Notes (Signed)
Physical Therapy Treatment Patient Details Name: Brittany Shaffer MRN: [id number] DOB: 1945/09/20 Today's Date: 12/19/2016    History of Present Illness Pt s/p R THR and with hx of L THR (14)    PT Comments    Pt progressing well with mobility and eager for dc home.  Reviewed stairs and car transfers.   Follow Up Recommendations  Home health PT     Equipment Recommendations  None recommended by PT    Recommendations for Other Services       Precautions / Restrictions Precautions Precautions: Fall Restrictions Weight Bearing Restrictions: No Other Position/Activity Restrictions: WBAT    Mobility  Bed Mobility Overal bed mobility: Needs Assistance Bed Mobility: Supine to Sit     Supine to sit: Min assist     General bed mobility comments: NT - Pt OOB and requests back to chair  Transfers Overall transfer level: Needs assistance Equipment used: Rolling walker (2 wheeled) Transfers: Sit to/from Stand Sit to Stand: Supervision         General transfer comment: min cues for LE management and use of UEs to self assist  Ambulation/Gait Ambulation/Gait assistance: Min guard;Supervision Ambulation Distance (Feet): 200 Feet Assistive device: Rolling walker (2 wheeled) Gait Pattern/deviations: Step-to pattern;Step-through pattern;Decreased step length - right;Decreased step length - left;Shuffle;Trunk flexed Gait velocity: decr Gait velocity interpretation: Below normal speed for age/gender General Gait Details: cues for posture, position from RW and initial sequence   Stairs Stairs: Yes   Stair Management: No rails;Step to pattern;Forwards;With walker Number of Stairs: 5 General stair comments: cues for sequence and foot placement.  Pt up stairs with door frame and down stairs with RW  Wheelchair Mobility    Modified Rankin (Stroke Patients Only)       Balance Overall balance assessment: Independent                                           Cognition Arousal/Alertness: Awake/alert Behavior During Therapy: WFL for tasks assessed/performed Overall Cognitive Status: Within Functional Limits for tasks assessed                                        Exercises Total Joint Exercises Ankle Circles/Pumps: AROM;Both;20 reps;Supine Quad Sets: AROM;Both;10 reps;Supine Heel Slides: AAROM;Right;20 reps;Supine Hip ABduction/ADduction: AAROM;Right;15 reps;Supine    General Comments        Pertinent Vitals/Pain Pain Assessment: 0-10 Pain Score: 3  Pain Location: R hip Pain Descriptors / Indicators: Aching;Sore Pain Intervention(s): Limited activity within patient's tolerance;Monitored during session;Premedicated before session    [place] expects to be discharged to:: Private residence Living Arrangements: Spouse/significant other;Children Available Help at Discharge: Family (dtr will assist until 4/24) Type of Home: House Home Access: Stairs to enter Entrance Stairs-Rails: None Home Layout: One level Home Equipment: Environmental consultant - 4 wheels Additional Comments: rails and seat in shower    Prior Function Level of Independence: Independent          PT Goals (current goals can now be found in the care plan section) Acute Rehab PT Goals Patient Stated Goal: regain IND ASAP PT Goal Formulation: With patient Time For Goal Achievement: 12/21/16 Potential to Achieve Goals: Good Progress towards PT goals: Progressing toward goals    Frequency    7X/week  PT Plan Current plan remains appropriate    Co-evaluation             End of Session Equipment Utilized During Treatment: Gait belt Activity Tolerance: Patient tolerated treatment well Patient left: in chair;with call [place]/phone within reach Nurse Communication: Mobility status PT Visit Diagnosis: Unsteadiness on feet (R26.81);Difficulty in walking, not elsewhere classified (R26.2)     Time: 4765-4650 PT Time  Calculation (min) (ACUTE ONLY): 16 min  Charges:  $Gait Training: 8-22 mins                    G Codes:       Pg [phone]    Dorotha Hirschi 12/19/2016, 1:33 PM

## 2016-12-19 NOTE — Evaluation (Addendum)
Physical Therapy Evaluation Patient Details Name: Brittany Shaffer MRN: [id number] DOB: [birth year] Today's Date: 12/19/2016   History of Present Illness  Pt s/p R THR and with hx of L THR (14)  Clinical Impression  Pt s/p R THR and presents with decreased R LE strength/ROM and post op pain limiting functional mobility.  Pt should progress to dc home with family assist and HHPT follow up.    Follow Up Recommendations Home health PT    Equipment Recommendations  None recommended by PT    Recommendations for Other Services       Precautions / Restrictions Precautions Precautions: Fall Restrictions Weight Bearing Restrictions: No Other Position/Activity Restrictions: WBAT      Mobility  Bed Mobility               General bed mobility comments: OOB deferred to after dressing change and gown change 2* bleeading from drain site  Transfers                    Ambulation/Gait                Stairs            Wheelchair Mobility    Modified Rankin (Stroke Patients Only)       Balance                                             Pertinent Vitals/Pain Pain Assessment: 0-10 Pain Score: 3  Pain Location: R hip Pain Descriptors / Indicators: Aching;Sore Pain Intervention(s): Limited activity within patient's tolerance;Monitored during session;Premedicated before session    [place] expects to be discharged to:: Private residence Living Arrangements: Spouse/significant other;Children Available Help at Discharge: Family (dtr will assist until 4/24) Type of Home: House Home Access: Stairs to enter Entrance Stairs-Rails: None Entrance Stairs-Number of Steps: 2 Home Layout: One level Home Equipment: Walker - 4 wheels Additional Comments: rails and seat in shower    Prior Function Level of Independence: Independent               Hand Dominance   Dominant Hand: Right    Extremity/Trunk Assessment   Upper Extremity Assessment Upper Extremity Assessment: Overall WFL for tasks assessed    Lower Extremity Assessment Lower Extremity Assessment: RLE deficits/detail RLE Deficits / Details: Strength at hip 2+/5 with AAROM at hip to 80 flex and 15 abd    Cervical / Trunk Assessment Cervical / Trunk Assessment: Normal  Communication   Communication: No difficulties  Cognition Arousal/Alertness: Awake/alert Behavior During Therapy: WFL for tasks assessed/performed Overall Cognitive Status: Within Functional Limits for tasks assessed                                        General Comments      Exercises  AP, bil 20 reps AROM QS bil 10 reps AROM HS 20 reps AAROM ABD 15 reps AAROM   Assessment/Plan    PT Assessment Patient needs continued PT services  PT Problem List Decreased strength;Decreased range of motion;Decreased activity tolerance;Decreased mobility;Pain;Decreased knowledge of use of DME       PT Treatment Interventions DME instruction;Gait training;Stair training;Functional mobility training;Therapeutic activities;Therapeutic exercise;Patient/family education    PT Goals (Current goals can be found in the  Care Plan section)  Acute Rehab PT Goals Patient Stated Goal: regain IND ASAP PT Goal Formulation: With patient Time For Goal Achievement: 12/21/16 Potential to Achieve Goals: Good    Frequency 7X/week   Barriers to discharge        Co-evaluation               End of Session   Activity Tolerance: Patient tolerated treatment well Patient left: in bed;with call [place]/phone within reach Nurse Communication: Mobility status PT Visit Diagnosis: Unsteadiness on feet (R26.81);Difficulty in walking, not elsewhere classified (R26.2)    Time: 8864-8472 PT Time Calculation (min) (ACUTE ONLY): 20 min   Charges:   PT Evaluation $PT Eval Low Complexity: 1 Procedure     PT G Codes:        Pg [phone]   Jearlean Demauro 12/19/2016,  12:27 PM

## 2016-12-19 NOTE — Progress Notes (Signed)
   Subjective: 1 Day Post-Op Procedure(s) (LRB): RIGHT TOTAL HIP ARTHROPLASTY ANTERIOR APPROACH (Right) Patient reports pain as mild.   Patient seen in rounds by Dr. Wynelle Link. Doing well today.  Wants to go home. Patient is well, but has had some minor complaints of pain in the hip, requiring pain medications We will start therapy today.  If they do well with therapy and meets all goals, then will allow home later this afternoon following therapy.  Will need tow sessions then home. Plan is to go Home after hospital stay.  Objective: Vital signs in last 24 hours: Temp:  [96 F (35.6 C)-97.7 F (36.5 C)] 97.6 F (36.4 C) (04/19 0626) Pulse Rate:  [54-74] 55 (04/19 0626) Resp:  [12-18] 15 (04/19 0626) BP: (104-143)/(65-83) 135/78 (04/19 0626) SpO2:  [96 %-100 %] 99 % (04/19 0626)  Intake/Output from previous day:  Intake/Output Summary (Last 24 hours) at 12/19/16 0722 Last data filed at 12/19/16 0626  Gross per 24 hour  Intake           4782.5 ml  Output             4610 ml  Net            172.5 ml    Intake/Output this shift: No intake/output data recorded.  Labs:  Recent Labs  12/19/16 0436  HGB 11.0*    Recent Labs  12/19/16 0436  WBC 11.0*  RBC 3.32*  HCT 32.4*  PLT 182    Recent Labs  12/19/16 0436  NA 139  K 4.2  CL 105  CO2 27  BUN 16  CREATININE 0.69  GLUCOSE 129*  CALCIUM 8.5*   No results for input(s): LABPT, INR in the last 72 hours.  EXAM General - Patient is Alert and Appropriate Extremity - Neurovascular intact Sensation intact distally Intact pulses distally Dorsiflexion/Plantar flexion intact Dressing - dressing C/D/I Motor Function - intact, moving foot and toes well on exam.  Hemovac pulled without difficulty.  Past Medical History:  Diagnosis Date  . Arthritis   . Atrial fibrillation ([place])   . Heart murmur   . Leg cramps   . Murmur, heart     Assessment/Plan: 1 Day Post-Op Procedure(s) (LRB): RIGHT TOTAL HIP  ARTHROPLASTY ANTERIOR APPROACH (Right) Active Problems:   OA (osteoarthritis) of hip  Estimated body mass index is 23.65 kg/m as calculated from the following:   Height as of this encounter: 5\' 7"  (1.702 m).   Weight as of this encounter: 68.5 kg (151 lb). Advance diet Up with therapy Discharge home with home health  DVT Prophylaxis - Xarelto Weight Bearing As Tolerated right Leg Hemovac Pulled Begin Therapy  If meets goals and able to go home: Up with therapy Discharge home with home health Diet - Cardiac diet Follow up - in 2 weeks Activity - WBAT Disposition - Home Condition Upon Discharge - pending therpay D/C Meds - See DC Summary DVT Prophylaxis - Xarelto  Arlee Muslim, PA-C Orthopaedic Surgery 12/19/2016, 7:22 AM

## 2016-12-20 DIAGNOSIS — Z96641 Presence of right artificial hip joint: Secondary | ICD-10-CM | POA: Diagnosis not present

## 2016-12-20 DIAGNOSIS — Z79891 Long term (current) use of opiate analgesic: Secondary | ICD-10-CM | POA: Diagnosis not present

## 2016-12-20 DIAGNOSIS — Z471 Aftercare following joint replacement surgery: Secondary | ICD-10-CM | POA: Diagnosis not present

## 2016-12-20 DIAGNOSIS — Z7901 Long term (current) use of anticoagulants: Secondary | ICD-10-CM | POA: Diagnosis not present

## 2016-12-20 DIAGNOSIS — E7800 Pure hypercholesterolemia, unspecified: Secondary | ICD-10-CM | POA: Diagnosis not present

## 2016-12-20 DIAGNOSIS — G8929 Other chronic pain: Secondary | ICD-10-CM | POA: Diagnosis not present

## 2017-01-01 DIAGNOSIS — Z79891 Long term (current) use of opiate analgesic: Secondary | ICD-10-CM | POA: Diagnosis not present

## 2017-01-01 DIAGNOSIS — Z7901 Long term (current) use of anticoagulants: Secondary | ICD-10-CM | POA: Diagnosis not present

## 2017-01-01 DIAGNOSIS — Z471 Aftercare following joint replacement surgery: Secondary | ICD-10-CM | POA: Diagnosis not present

## 2017-01-01 DIAGNOSIS — E7800 Pure hypercholesterolemia, unspecified: Secondary | ICD-10-CM | POA: Diagnosis not present

## 2017-01-01 DIAGNOSIS — Z96641 Presence of right artificial hip joint: Secondary | ICD-10-CM | POA: Diagnosis not present

## 2017-01-01 DIAGNOSIS — G8929 Other chronic pain: Secondary | ICD-10-CM | POA: Diagnosis not present

## 2017-01-24 DIAGNOSIS — Z96641 Presence of right artificial hip joint: Secondary | ICD-10-CM | POA: Diagnosis not present

## 2017-01-24 DIAGNOSIS — Z471 Aftercare following joint replacement surgery: Secondary | ICD-10-CM | POA: Diagnosis not present

## 2017-02-07 ENCOUNTER — Other Ambulatory Visit: Payer: Self-pay | Admitting: Gynecology

## 2017-02-21 ENCOUNTER — Encounter: Payer: PPO | Admitting: Gynecology

## 2017-03-06 ENCOUNTER — Encounter: Payer: Self-pay | Admitting: Gynecology

## 2017-03-06 ENCOUNTER — Ambulatory Visit (INDEPENDENT_AMBULATORY_CARE_PROVIDER_SITE_OTHER): Payer: PPO | Admitting: Gynecology

## 2017-03-06 VITALS — BP 122/76 | Ht 67.0 in | Wt 147.0 lb

## 2017-03-06 DIAGNOSIS — Z01411 Encounter for gynecological examination (general) (routine) with abnormal findings: Secondary | ICD-10-CM

## 2017-03-06 DIAGNOSIS — N952 Postmenopausal atrophic vaginitis: Secondary | ICD-10-CM

## 2017-03-06 DIAGNOSIS — Z79890 Hormone replacement therapy (postmenopausal): Secondary | ICD-10-CM

## 2017-03-06 MED ORDER — ZOLPIDEM TARTRATE 10 MG PO TABS: 10.0000 mg | ORAL_TABLET | Freq: Every evening | ORAL | 1 refills | Status: DC | PRN

## 2017-03-06 MED ORDER — ESTRADIOL 0.025 MG/24HR TD PTWK: MEDICATED_PATCH | TRANSDERMAL | 11 refills | Status: DC

## 2017-03-06 NOTE — Patient Instructions (Signed)
Follow up in one year for annual exam 

## 2017-03-06 NOTE — Progress Notes (Signed)
    Brittany Shaffer 06/23/1946 [id number]        71 y.o.  [id number] for breast and pelvic exam.  Past medical history,surgical history, problem list, medications, allergies, family history and social history were all reviewed and documented as reviewed in the EPIC chart.  ROS:  Performed with pertinent positives and negatives included in the history, assessment and plan.   Additional significant findings :  None   Exam: Caryn Bee assistant Vitals:   03/06/17 1550  BP: 122/76  Weight: 147 lb (66.7 kg)  Height: 5\' 7"  (1.702 m)   Body mass index is 23.02 kg/m.  General appearance:  Normal affect, orientation and appearance. Skin: Grossly normal HEENT: Without gross lesions.  No cervical or supraclavicular adenopathy. Thyroid normal.  Lungs:  Clear without wheezing, rales or rhonchi Cardiac: RR, without RMG Abdominal:  Soft, nontender, without masses, guarding, rebound, organomegaly or hernia Breasts:  Examined lying and sitting without masses, retractions, discharge or axillary adenopathy. Pelvic:  Ext, BUS, Vagina: With atrophic changes  Adnexa: Without masses or tenderness    Anus and perineum: Normal   Rectovaginal: Normal sphincter tone without palpated masses or tenderness.    Assessment/Plan:  71 y.o. G62P2002 female for breast and pelvic exam, status post TAH/BSO by Dr. 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  1. Postmenopausal/atrophic genital changes. Continues on Climara 0.025 mg patch. Has tried stopping but does not feel well.  I again reviewed the issues of HRT to include increased risk of thrombosis such as stroke heart attack DVT. Use of HRT with advancing age risks discussed. The breast cancer issue and HRT also reviewed. After a lengthy discussion the patient wants to continue I refilled her 1 year. 2. Mammography due this summer and she knows to schedule this. SBE monthly reviewed. Breast exam normal today. 3. Pap smear 2011. No Pap smear done today. We  both agree to stop screening per current screening guidelines based on no history of abnormal Pap smears, age 78 and hysterectomy history. 4. Colonoscopy 2011. Repeat at their recommended interval. 5. DEXA 2014 normal. Plan repeat DEXA next year at 5 year interval. Increase calcium vitamin D. 6. Insomnia. Uses Ambien 5 mg occasionally for insomnia. Ambien 10 mg #30 with one refill provided to use one half as needed for sleep. 7. Health maintenance. No routine lab work done as patient does this at her primary physician's office. Follow up 1 year, sooner as needed.   Anastasio Auerbach MD, 4:45 PM 03/06/2017

## 2017-03-07 ENCOUNTER — Encounter: Payer: PPO | Admitting: Gynecology

## 2017-03-11 DIAGNOSIS — M1611 Unilateral primary osteoarthritis, right hip: Secondary | ICD-10-CM | POA: Diagnosis not present

## 2017-03-11 DIAGNOSIS — Z471 Aftercare following joint replacement surgery: Secondary | ICD-10-CM | POA: Diagnosis not present

## 2017-03-11 DIAGNOSIS — Z96641 Presence of right artificial hip joint: Secondary | ICD-10-CM | POA: Diagnosis not present

## 2017-04-19 ENCOUNTER — Other Ambulatory Visit: Payer: Self-pay | Admitting: Gynecology

## 2017-04-24 NOTE — Addendum Note (Signed)
Addendum  created 04/24/17 1320 by Roberts Gaudy, MD   Sign clinical note

## 2017-06-06 ENCOUNTER — Encounter: Payer: Self-pay | Admitting: Gastroenterology

## 2017-09-15 DIAGNOSIS — Z79899 Other long term (current) drug therapy: Secondary | ICD-10-CM | POA: Diagnosis not present

## 2017-09-15 DIAGNOSIS — Z1389 Encounter for screening for other disorder: Secondary | ICD-10-CM | POA: Diagnosis not present

## 2017-09-15 DIAGNOSIS — E7800 Pure hypercholesterolemia, unspecified: Secondary | ICD-10-CM | POA: Diagnosis not present

## 2017-09-15 DIAGNOSIS — Z0000 Encounter for general adult medical examination without abnormal findings: Secondary | ICD-10-CM | POA: Diagnosis not present

## 2017-09-15 DIAGNOSIS — R011 Cardiac murmur, unspecified: Secondary | ICD-10-CM | POA: Diagnosis not present

## 2017-10-14 DIAGNOSIS — R05 Cough: Secondary | ICD-10-CM | POA: Diagnosis not present

## 2017-10-23 DIAGNOSIS — Z1231 Encounter for screening mammogram for malignant neoplasm of breast: Secondary | ICD-10-CM | POA: Diagnosis not present

## 2017-11-04 DIAGNOSIS — L821 Other seborrheic keratosis: Secondary | ICD-10-CM | POA: Diagnosis not present

## 2017-11-04 DIAGNOSIS — D1801 Hemangioma of skin and subcutaneous tissue: Secondary | ICD-10-CM | POA: Diagnosis not present

## 2017-11-04 DIAGNOSIS — L570 Actinic keratosis: Secondary | ICD-10-CM | POA: Diagnosis not present

## 2017-11-04 DIAGNOSIS — L812 Freckles: Secondary | ICD-10-CM | POA: Diagnosis not present

## 2017-11-04 DIAGNOSIS — D225 Melanocytic nevi of trunk: Secondary | ICD-10-CM | POA: Diagnosis not present

## 2017-11-04 DIAGNOSIS — L438 Other lichen planus: Secondary | ICD-10-CM | POA: Diagnosis not present

## 2017-12-03 DIAGNOSIS — M25552 Pain in left hip: Secondary | ICD-10-CM | POA: Diagnosis not present

## 2017-12-03 DIAGNOSIS — M545 Low back pain: Secondary | ICD-10-CM | POA: Diagnosis not present

## 2017-12-03 DIAGNOSIS — M79605 Pain in left leg: Secondary | ICD-10-CM | POA: Diagnosis not present

## 2017-12-26 DIAGNOSIS — Z96641 Presence of right artificial hip joint: Secondary | ICD-10-CM | POA: Diagnosis not present

## 2017-12-26 DIAGNOSIS — M47896 Other spondylosis, lumbar region: Secondary | ICD-10-CM | POA: Diagnosis not present

## 2017-12-26 DIAGNOSIS — Z96642 Presence of left artificial hip joint: Secondary | ICD-10-CM | POA: Diagnosis not present

## 2018-01-01 DIAGNOSIS — Z96642 Presence of left artificial hip joint: Secondary | ICD-10-CM | POA: Diagnosis not present

## 2018-01-01 DIAGNOSIS — Z96641 Presence of right artificial hip joint: Secondary | ICD-10-CM | POA: Diagnosis not present

## 2018-01-01 DIAGNOSIS — M47896 Other spondylosis, lumbar region: Secondary | ICD-10-CM | POA: Diagnosis not present

## 2018-01-06 DIAGNOSIS — Z96641 Presence of right artificial hip joint: Secondary | ICD-10-CM | POA: Diagnosis not present

## 2018-01-06 DIAGNOSIS — Z96642 Presence of left artificial hip joint: Secondary | ICD-10-CM | POA: Diagnosis not present

## 2018-01-06 DIAGNOSIS — M47896 Other spondylosis, lumbar region: Secondary | ICD-10-CM | POA: Diagnosis not present

## 2018-01-12 DIAGNOSIS — M47896 Other spondylosis, lumbar region: Secondary | ICD-10-CM | POA: Diagnosis not present

## 2018-01-12 DIAGNOSIS — Z96641 Presence of right artificial hip joint: Secondary | ICD-10-CM | POA: Diagnosis not present

## 2018-01-12 DIAGNOSIS — Z96642 Presence of left artificial hip joint: Secondary | ICD-10-CM | POA: Diagnosis not present

## 2018-01-13 ENCOUNTER — Observation Stay (HOSPITAL_COMMUNITY)
Admission: EM | Admit: 2018-01-13 | Discharge: 2018-01-14 | Disposition: A | Discharge status: Home / Self Care | Payer: PPO | Attending: Cardiovascular Disease | Admitting: Cardiovascular Disease

## 2018-01-13 ENCOUNTER — Emergency Department (HOSPITAL_COMMUNITY): Payer: PPO

## 2018-01-13 ENCOUNTER — Encounter (HOSPITAL_COMMUNITY): Payer: Self-pay | Admitting: Emergency Medicine

## 2018-01-13 ENCOUNTER — Other Ambulatory Visit: Payer: Self-pay

## 2018-01-13 DIAGNOSIS — Z885 Allergy status to narcotic agent status: Secondary | ICD-10-CM | POA: Insufficient documentation

## 2018-01-13 DIAGNOSIS — Z8249 Family history of ischemic heart disease and other diseases of the circulatory system: Secondary | ICD-10-CM | POA: Diagnosis not present

## 2018-01-13 DIAGNOSIS — E785 Hyperlipidemia, unspecified: Secondary | ICD-10-CM

## 2018-01-13 DIAGNOSIS — R0789 Other chest pain: Secondary | ICD-10-CM | POA: Diagnosis not present

## 2018-01-13 DIAGNOSIS — I4510 Unspecified right bundle-branch block: Secondary | ICD-10-CM | POA: Diagnosis not present

## 2018-01-13 DIAGNOSIS — I10 Essential (primary) hypertension: Secondary | ICD-10-CM

## 2018-01-13 DIAGNOSIS — Z90710 Acquired absence of both cervix and uterus: Secondary | ICD-10-CM | POA: Insufficient documentation

## 2018-01-13 DIAGNOSIS — I420 Dilated cardiomyopathy: Secondary | ICD-10-CM | POA: Insufficient documentation

## 2018-01-13 DIAGNOSIS — Z90722 Acquired absence of ovaries, bilateral: Secondary | ICD-10-CM | POA: Diagnosis not present

## 2018-01-13 DIAGNOSIS — I080 Rheumatic disorders of both mitral and aortic valves: Secondary | ICD-10-CM | POA: Insufficient documentation

## 2018-01-13 DIAGNOSIS — R011 Cardiac murmur, unspecified: Secondary | ICD-10-CM | POA: Diagnosis not present

## 2018-01-13 DIAGNOSIS — R0609 Other forms of dyspnea: Secondary | ICD-10-CM

## 2018-01-13 DIAGNOSIS — R9431 Abnormal electrocardiogram [ECG] [EKG]: Secondary | ICD-10-CM

## 2018-01-13 DIAGNOSIS — I25110 Atherosclerotic heart disease of native coronary artery with unstable angina pectoris: Secondary | ICD-10-CM | POA: Diagnosis not present

## 2018-01-13 DIAGNOSIS — Z96643 Presence of artificial hip joint, bilateral: Secondary | ICD-10-CM | POA: Diagnosis not present

## 2018-01-13 DIAGNOSIS — Z9889 Other specified postprocedural states: Secondary | ICD-10-CM | POA: Diagnosis not present

## 2018-01-13 DIAGNOSIS — I200 Unstable angina: Secondary | ICD-10-CM | POA: Diagnosis not present

## 2018-01-13 DIAGNOSIS — I2510 Atherosclerotic heart disease of native coronary artery without angina pectoris: Secondary | ICD-10-CM

## 2018-01-13 DIAGNOSIS — R069 Unspecified abnormalities of breathing: Secondary | ICD-10-CM | POA: Diagnosis not present

## 2018-01-13 DIAGNOSIS — M1990 Unspecified osteoarthritis, unspecified site: Secondary | ICD-10-CM | POA: Insufficient documentation

## 2018-01-13 DIAGNOSIS — I350 Nonrheumatic aortic (valve) stenosis: Secondary | ICD-10-CM

## 2018-01-13 DIAGNOSIS — R0602 Shortness of breath: Secondary | ICD-10-CM | POA: Diagnosis not present

## 2018-01-13 LAB — CBC WITH DIFFERENTIAL/PLATELET
ABS IMMATURE GRANULOCYTES: 0 10*3/uL (ref 0.0–0.1)
BASOS PCT: 1 %
Basophils Absolute: 0.1 10*3/uL (ref 0.0–0.1)
EOS ABS: 0.1 10*3/uL (ref 0.0–0.7)
Eosinophils Relative: 2 %
HCT: 39.9 % (ref 36.0–46.0)
Hemoglobin: 13 g/dL (ref 12.0–15.0)
Immature Granulocytes: 0 %
LYMPHS ABS: 1.7 10*3/uL (ref 0.7–4.0)
Lymphocytes Relative: 31 %
MCH: 32.8 pg (ref 26.0–34.0)
MCHC: 32.6 g/dL (ref 30.0–36.0)
MCV: 100.8 fL — ABNORMAL HIGH (ref 78.0–100.0)
MONOS PCT: 11 %
Monocytes Absolute: 0.6 10*3/uL (ref 0.1–1.0)
NEUTROS ABS: 2.9 10*3/uL (ref 1.7–7.7)
NEUTROS PCT: 55 %
PLATELETS: 195 10*3/uL (ref 150–400)
RBC: 3.96 MIL/uL (ref 3.87–5.11)
RDW: 13.2 % (ref 11.5–15.5)
WBC: 5.4 10*3/uL (ref 4.0–10.5)

## 2018-01-13 LAB — CBC
HEMATOCRIT: 40.5 % (ref 36.0–46.0)
HEMOGLOBIN: 13.5 g/dL (ref 12.0–15.0)
MCH: 33.3 pg (ref 26.0–34.0)
MCHC: 33.3 g/dL (ref 30.0–36.0)
MCV: 99.8 fL (ref 78.0–100.0)
Platelets: 196 10*3/uL (ref 150–400)
RBC: 4.06 MIL/uL (ref 3.87–5.11)
RDW: 13.2 % (ref 11.5–15.5)
WBC: 5.3 10*3/uL (ref 4.0–10.5)

## 2018-01-13 LAB — BASIC METABOLIC PANEL
ANION GAP: 8 (ref 5–15)
BUN: 18 mg/dL (ref 6–20)
CALCIUM: 9.3 mg/dL (ref 8.9–10.3)
CHLORIDE: 108 mmol/L (ref 101–111)
CO2: 26 mmol/L (ref 22–32)
CREATININE: 0.84 mg/dL (ref 0.44–1.00)
GFR calc Af Amer: >60 mL/min (ref >60)
GFR calc non Af Amer: >60 mL/min (ref >60)
Glucose, Bld: 102 mg/dL — ABNORMAL HIGH (ref 65–99)
Potassium: 3.9 mmol/L (ref 3.5–5.1)
SODIUM: 142 mmol/L (ref 135–145)

## 2018-01-13 LAB — TROPONIN I

## 2018-01-13 LAB — CREATININE, SERUM
Creatinine, Ser: 0.8 mg/dL (ref 0.44–1.00)
GFR calc Af Amer: >60 mL/min (ref >60)
GFR calc non Af Amer: >60 mL/min (ref >60)

## 2018-01-13 LAB — I-STAT TROPONIN, ED: Troponin i, poc: 0 ng/mL (ref 0.00–0.08)

## 2018-01-13 MED ORDER — ASPIRIN 81 MG PO CHEW
324.0000 mg | CHEWABLE_TABLET | Freq: Once | ORAL | Status: AC
  Administered 2018-01-13: 324 mg via ORAL
  Filled 2018-01-13: qty 4

## 2018-01-13 MED ORDER — ETODOLAC 300 MG PO CAPS
500.0000 mg | ORAL_CAPSULE | Freq: Every day | ORAL | Status: DC
  Filled 2018-01-13: qty 1

## 2018-01-13 MED ORDER — SODIUM CHLORIDE 0.9 % IV SOLN: 250.0000 mL | INTRAVENOUS | Status: DC | PRN

## 2018-01-13 MED ORDER — ASPIRIN 81 MG PO CHEW
81.0000 mg | CHEWABLE_TABLET | ORAL | Status: AC
  Administered 2018-01-14: 81 mg via ORAL
  Filled 2018-01-13: qty 1

## 2018-01-13 MED ORDER — ATORVASTATIN CALCIUM 40 MG PO TABS: 40.0000 mg | ORAL_TABLET | Freq: Every day | ORAL | Status: DC

## 2018-01-13 MED ORDER — SODIUM CHLORIDE 0.9 % WEIGHT BASED INFUSION: 1.0000 mL/kg/h | INTRAVENOUS | Status: DC

## 2018-01-13 MED ORDER — ASPIRIN EC 81 MG PO TBEC: 81.0000 mg | DELAYED_RELEASE_TABLET | Freq: Every day | ORAL | Status: DC

## 2018-01-13 MED ORDER — NITROGLYCERIN 2 % TD OINT
1.0000 [in_us] | TOPICAL_OINTMENT | Freq: Four times a day (QID) | TRANSDERMAL | Status: DC
  Administered 2018-01-14 (×2): 1 [in_us] via TOPICAL
  Filled 2018-01-13: qty 30

## 2018-01-13 MED ORDER — ACETAMINOPHEN 325 MG PO TABS: 650.0000 mg | ORAL_TABLET | ORAL | Status: DC | PRN

## 2018-01-13 MED ORDER — HEPARIN SODIUM (PORCINE) 5000 UNIT/ML IJ SOLN
5000.0000 [IU] | Freq: Three times a day (TID) | INTRAMUSCULAR | Status: DC
  Administered 2018-01-13 – 2018-01-14 (×2): 5000 [IU] via SUBCUTANEOUS
  Filled 2018-01-13 (×2): qty 1

## 2018-01-13 MED ORDER — SODIUM CHLORIDE 0.9% FLUSH
3.0000 mL | Freq: Two times a day (BID) | INTRAVENOUS | Status: DC
  Administered 2018-01-13: 3 mL via INTRAVENOUS

## 2018-01-13 MED ORDER — NITROGLYCERIN 0.4 MG SL SUBL: 0.4000 mg | SUBLINGUAL_TABLET | SUBLINGUAL | Status: DC | PRN

## 2018-01-13 MED ORDER — ZOLPIDEM TARTRATE 5 MG PO TABS
5.0000 mg | ORAL_TABLET | Freq: Every evening | ORAL | Status: DC | PRN
  Administered 2018-01-14: 5 mg via ORAL
  Filled 2018-01-13: qty 1

## 2018-01-13 MED ORDER — HYDRALAZINE HCL 20 MG/ML IJ SOLN
10.0000 mg | INTRAMUSCULAR | Status: DC | PRN
Start: 2018-01-13 — End: 2018-01-14

## 2018-01-13 MED ORDER — ONDANSETRON HCL 4 MG/2ML IJ SOLN: 4.0000 mg | Freq: Four times a day (QID) | INTRAMUSCULAR | Status: DC | PRN

## 2018-01-13 MED ORDER — SODIUM CHLORIDE 0.9 % WEIGHT BASED INFUSION
3.0000 mL/kg/h | INTRAVENOUS | Status: DC
  Administered 2018-01-14: 3 mL/kg/h via INTRAVENOUS

## 2018-01-13 MED ORDER — NITROGLYCERIN 2 % TD OINT
1.0000 [in_us] | TOPICAL_OINTMENT | Freq: Once | TRANSDERMAL | Status: AC
  Administered 2018-01-13: 1 [in_us] via TOPICAL
  Filled 2018-01-13: qty 1

## 2018-01-13 MED ORDER — SODIUM CHLORIDE 0.9% FLUSH: 3.0000 mL | INTRAVENOUS | Status: DC | PRN

## 2018-01-13 MED ORDER — ETODOLAC 500 MG PO TABS: 500.0000 mg | ORAL_TABLET | Freq: Every day | ORAL | Status: DC

## 2018-01-13 NOTE — ED Provider Notes (Signed)
[facility] EMERGENCY DEPARTMENT Provider Note   CSN: [id number] Arrival date & time: 01/13/18  1800     History   Chief Complaint Chief Complaint  Patient presents with  . Shortness of Breath    HPI Brittany Shaffer is a 72 y.o. female.  brought brought by EMS.  Sent here from Dr. Antony Haste [name]'s office where she underwent evaluation today.  She complains of dyspnea on exertion with walking up a hill or with playing one game of tennis where she could normally exert herself without difficulty.  Symptoms read by chest tightness and worse with exertion and improved with rest.  She last became dyspneic and developed chest tightness this morning while exerting herself..  She is presently planning of minimal anterior chest tightness.  No shortness of breath.  No other associated symptoms  HPI  Past Medical History:  Diagnosis Date  . Arthritis   . Atrial fibrillation ([place])   . Heart murmur   . Leg cramps   . Murmur, heart   Denies history of atrial fibrillation  Patient Active Problem List   Diagnosis Date Noted  . Varicose veins of bilateral lower extremities with other complications 93/79/0240  . Hamstring tightness of left lower extremity 03/15/2015  . Metatarsalgia of left foot 10/20/2014  . Abnormality of gait 10/20/2014  . DJD (degenerative joint disease), ankle and foot 10/20/2014  . Postoperative anemia due to acute blood loss 03/25/2013  . OA (osteoarthritis) of hip 03/24/2013  . Osteoarthritis of knee   . PLANTAR FASCIITIS 07/28/2007  . HAMMER TOE, ACQUIRED 07/28/2007    Past Surgical History:  Procedure Laterality Date  . ABDOMINAL HYSTERECTOMY  03/03/2012   Procedure: HYSTERECTOMY ABDOMINAL;  Surgeon: Alvino Chapel, MD;  Location: [facility];  Service: Gynecology;  Laterality: N/A;  . Eye lid surg    . LAPAROTOMY  03/03/2012   Procedure: EXPLORATORY LAPAROTOMY;  Surgeon: Alvino Chapel, MD;  Location: [facility];  Service: Gynecology;   Laterality: N/A; 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  . SALPINGOOPHORECTOMY  03/03/2012   Procedure: SALPINGO OOPHERECTOMY;  Surgeon: Alvino Chapel, MD;  Location: [facility];  Service: Gynecology;  Laterality: Bilateral;  . TOTAL HIP ARTHROPLASTY Left 03/24/2013   Procedure: LEFT TOTAL HIP ARTHROPLASTY ANTERIOR APPROACH;  Surgeon: [name], MD;  Location: [facility];  Service: Orthopedics;  Laterality: Left;  . TOTAL HIP ARTHROPLASTY Right 12/18/2016   Procedure: RIGHT TOTAL HIP ARTHROPLASTY ANTERIOR APPROACH;  Surgeon: Gaynelle Arabian, MD;  Location: [facility];  Service: Orthopedics;  Laterality: Right;  . TUBAL LIGATION       OB History    Gravida  2   Para  2   Term  2   Preterm      AB      Living  2     SAB      TAB      Ectopic      Multiple      Live Births               Home Medications    Prior to Admission medications   Medication Sig Start Date End Date Taking? Authorizing Provider  estradiol (CLIMARA - DOSED IN MG/24 HR) 0.025 mg/24hr patch PLACE 1 PATCH (0.025 MG TOTAL) ONTO THE SKIN ONCE A WEEK. 03/06/17   Fontaine, Belinda Block, MD  estradiol (CLIMARA - DOSED IN MG/24 HR) 0.025 mg/24hr patch PLACE 1 PATCH (0.025 MG TOTAL) ONTO THE SKIN ONCE A WEEK. 04/21/17  Fontaine, Belinda Block, MD  etodolac (LODINE) 500 MG tablet Take 500 mg by mouth daily.    [provider]  zolpidem (AMBIEN) 10 MG tablet Take 1 tablet (10 mg total) by mouth at bedtime as needed. For sleep 03/06/17   Fontaine, Belinda Block, MD    Family History Family History  Problem Relation Age of Onset  . Hypertension Mother   . Bone cancer Father   . Lung cancer Father   . Ovarian cancer Sister   . Diabetes Maternal Aunt   . Breast cancer Maternal Aunt        Age 22's    Social History Social History   Tobacco Use  . Smoking status: Never Smoker  . Smokeless tobacco: Never Used  Substance Use Topics  . Alcohol use: Yes    Alcohol/week: 4.2 oz    Types: 7 Standard drinks or  equivalent per week    Comment: 1 / PER DAY  . Drug use: No     Allergies   Codeine   Review of Systems Review of Systems  Constitutional: Negative.   HENT: Negative.   Respiratory: Positive for shortness of breath.   Cardiovascular: Positive for chest pain.  Gastrointestinal: Negative.   Musculoskeletal: Negative.   Skin: Negative.   Neurological: Negative.   Psychiatric/Behavioral: Negative.   All other systems reviewed and are negative.    Physical Exam Updated Vital Signs BP (!) 166/90   Pulse 64   Temp 98.3 F (36.8 C) (Oral)   Resp 12   Ht 5\' 7"  (1.702 m)   Wt 69.9 kg (154 lb)   SpO2 100%   BMI 24.12 kg/m   Physical Exam  Constitutional: She appears well-developed and well-nourished.  HENT:  Head: Normocephalic and atraumatic.  Eyes: Pupils are equal, round, and reactive to light. Conjunctivae are normal.  Neck: Neck supple. No tracheal deviation present. No thyromegaly present.  Cardiovascular: Normal rate, regular rhythm and normal heart sounds.  No murmur heard. Pulmonary/Chest: Effort normal and breath sounds normal.  Abdominal: Soft. Bowel sounds are normal. She exhibits no distension. There is no tenderness.  Musculoskeletal: Normal range of motion. She exhibits no edema or tenderness.  Neurological: She is alert. Coordination normal.  Skin: Skin is warm and dry. No rash noted.  Psychiatric: She has a normal mood and affect.  Nursing note and vitals reviewed.    ED Treatments / Results  Labs (all labs ordered are listed, but only abnormal results are displayed) Labs Reviewed  BASIC METABOLIC PANEL  CBC WITH DIFFERENTIAL/PLATELET  I-STAT TROPONIN, ED    EKG None  Radiology No results found.  Procedures Procedures (including critical care time)  Medications Ordered in ED Medications  aspirin chewable tablet 324 mg (has no administration in time range)   Results for orders placed or performed during the hospital encounter of  06/28/24  Basic metabolic panel  Result Value Ref Range   Sodium 142 135 - 145 mmol/L   Potassium 3.9 3.5 - 5.1 mmol/L   Chloride 108 101 - 111 mmol/L   CO2 26 22 - 32 mmol/L   Glucose, Bld 102 (H) 65 - 99 mg/dL   BUN 18 6 - 20 mg/dL   Creatinine, Ser 0.84 0.44 - 1.00 mg/dL   Calcium 9.3 8.9 - 10.3 mg/dL   GFR calc non Af Amer >60 >60 mL/min   GFR calc Af Amer >60 >60 mL/min   Anion gap 8 5 - 15  CBC with Differential/Platelet  Result  Value Ref Range   WBC 5.4 4.0 - 10.5 K/uL   RBC 3.96 3.87 - 5.11 MIL/uL   Hemoglobin 13.0 12.0 - 15.0 g/dL   HCT 39.9 36.0 - 46.0 %   MCV 100.8 (H) 78.0 - 100.0 fL   MCH 32.8 26.0 - 34.0 pg   MCHC 32.6 30.0 - 36.0 g/dL   RDW 13.2 11.5 - 15.5 %   Platelets 195 150 - 400 K/uL   Neutrophils Relative % 55 %   Neutro Abs 2.9 1.7 - 7.7 K/uL   Lymphocytes Relative 31 %   Lymphs Abs 1.7 0.7 - 4.0 K/uL   Monocytes Relative 11 %   Monocytes Absolute 0.6 0.1 - 1.0 K/uL   Eosinophils Relative 2 %   Eosinophils Absolute 0.1 0.0 - 0.7 K/uL   Basophils Relative 1 %   Basophils Absolute 0.1 0.0 - 0.1 K/uL   Immature Granulocytes 0 %   Abs Immature Granulocytes 0.0 0.0 - 0.1 K/uL  I-Stat Troponin, ED (not at [facility])  Result Value Ref Range   Troponin i, poc 0.00 0.00 - 0.08 ng/mL   Comment 3           No results found.   Initial Impression / Assessment and Plan / ED Course  I have reviewed the triage vital signs and the nursing notes.  Pertinent labs & imaging results that were available during my care of the patient were reviewed by me and considered in my medical decision making (see chart for details).     Story concerning for angina.  Aspirin and topical nitrates ordered.  I consulted Dr. [name] from cardiology who will come to evaluate patient. heart score equals 5 8:15 PM patient resting comfortably continues to complain of minimal anterior chest tightness.  Dr. [name] evaluate patient in the ED and arrange for overnight stay Final Clinical  Impressions(s) / ED Diagnoses  Diagnosis unstable angina Final diagnoses:  None  CRITICAL CARE Performed by: Orlie Dakin Total critical care time: 30 minutes Critical care time was exclusive of separately billable procedures and treating other patients. Critical care was necessary to treat or prevent imminent or life-threatening deterioration. Critical care was time spent personally by me on the following activities: development of treatment plan with patient and/or surrogate as well as nursing, discussions with consultants, evaluation of patient's response to treatment, examination of patient, obtaining history from patient or surrogate, ordering and performing treatments and interventions, ordering and review of laboratory studies, ordering and review of radiographic studies, pulse oximetry and re-evaluation of patient's condition.  ED Discharge Orders    None       Orlie Dakin, MD 01/13/18 2023

## 2018-01-13 NOTE — Progress Notes (Signed)
Called ED to get a report. RN not ready for now. ED RN will call me back when he is ready.

## 2018-01-13 NOTE — H&P (Addendum)
Cardiology Admission History and Physical:   Patient ID: Brittany Shaffer; MRN: [id number]; DOB: [birth year]   Admission date: 01/13/2018  Primary Care Provider: Hulan Fess, MD Primary Cardiologist: New  Chief Complaint:  Unstable angina/exertional dyspnea  Patient Profile:   Brittany Shaffer is a 72 y.o. female with a h/o cardiac murmur and RBBB (dating back to at least 2014), who presented to the ED 5/13 with a 2 wk h/o progressive exertional dyspnea and unstable angina.  History of Present Illness:   Brittany Shaffer is a 72 y/o ? with a h/o cardiac murmur and RBBB (dating back to at least 2014).  She has a long h/o a cardiac murmur and more recently OA s/p R total hip arthroplasty in 12/2016.  She lives locally and is very active, walking frequently and playing tennis multiple days/wk.  About 5 months ago, she began to notice occasional DOE but there wasn't a particular rhyme or reason to it, as she remained active.  Over the past two weeks however, she has been experiencing frequent bouts of exertional dyspnea and chest pressure, often while playing tennis or walking up inclines, lasting 5-10 mins, and resolving with rest.  She has not had any rest or nocturnal symptoms. She denies pnd, orthopnea, n, v, dizziness, syncope, edema, or early satiety.  Due to progressive and ongoing symptoms, she saw her PCP today, who recommended ER evaluation.  Here, she is chest pain free however BP's have been trending high - 160's/90's.  Initial trop, H/H, creat nl.     Past Medical History:  Diagnosis Date  . Arthritis   . Heart murmur   . Leg cramps     Past Surgical History:  Procedure Laterality Date  . ABDOMINAL HYSTERECTOMY  03/03/2012   Procedure: HYSTERECTOMY ABDOMINAL;  Surgeon: Alvino Chapel, MD;  Location: [facility];  Service: Gynecology;  Laterality: N/A;  . Eye lid surg    . 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 SALPINGOOPHORECTOMY  03/03/2012   Procedure: SALPINGO OOPHERECTOMY;  Surgeon: Alvino Chapel, MD;  Location: [facility];  Service: Gynecology;  Laterality: Bilateral;  . TOTAL HIP ARTHROPLASTY Left 03/24/2013   Procedure: LEFT TOTAL HIP ARTHROPLASTY ANTERIOR APPROACH;  Surgeon: [name], MD;  Location: [facility];  Service: Orthopedics;  Laterality: Left;  . TOTAL HIP ARTHROPLASTY Right 12/18/2016   Procedure: RIGHT TOTAL HIP ARTHROPLASTY ANTERIOR APPROACH;  Surgeon: Gaynelle Arabian, MD;  Location: [facility];  Service: Orthopedics;  Laterality: Right;  . TUBAL LIGATION       Medications Prior to Admission: Prior to Admission medications   Medication Sig Start Date End Date Taking? Authorizing Provider  estradiol (CLIMARA - DOSED IN MG/24 HR) 0.025 mg/24hr patch PLACE 1 PATCH (0.025 MG TOTAL) ONTO THE SKIN ONCE A WEEK. 03/06/17   Fontaine, Belinda Block, MD  estradiol (CLIMARA - DOSED IN MG/24 HR) 0.025 mg/24hr patch PLACE 1 PATCH (0.025 MG TOTAL) ONTO THE SKIN ONCE A WEEK. 04/21/17   Fontaine, Belinda Block, MD  etodolac (LODINE) 500 MG tablet Take 500 mg by mouth daily.    [provider]  zolpidem (AMBIEN) 10 MG tablet Take 1 tablet (10 mg total) by mouth at bedtime as needed. For sleep 03/06/17   Fontaine, Belinda Block, MD     Allergies:    Allergies  Allergen Reactions  . Codeine  Hallucinations    Social History:   Social History   Socioeconomic History  . Marital status: Married    Spouse name: Not on file  . Number of children: Not on file  . Years of education: Not on file  . Highest education level: Not on file  Occupational History  . Not on file  Social Needs  . Financial resource strain: Not on file  . Food insecurity:    Worry: Not on file    Inability: Not on file  . Transportation needs:    Medical: Not on file    Non-medical: Not on file  Tobacco Use  . Smoking status: Never Smoker  .  Smokeless tobacco: Never Used  Substance and Sexual Activity  . Alcohol use: Yes    Alcohol/week: 4.2 oz    Types: 7 Standard drinks or equivalent per week    Comment: 1 / PER DAY  . Drug use: No  . Sexual activity: Never    Birth control/protection: Surgical    Comment: 1st intercourse 72 yo-Fewer than 5 partners  Lifestyle  . Physical activity:    Days per week: Not on file    Minutes per session: Not on file  . Stress: Not on file  Relationships  . Social connections:    Talks on phone: Not on file    Gets together: Not on file    Attends religious service: Not on file    Active member of club or organization: Not on file    Attends meetings of clubs or organizations: Not on file    Relationship status: Not on file  . Intimate partner violence:    Fear of current or ex partner: Not on file    Emotionally abused: Not on file    Physically abused: Not on file    Forced sexual activity: Not on file  Other Topics Concern  . Not on file  Social History Narrative   Lives locally.  Retired. Active - walks frequently and plays tennis regularly.    Family History:  No premature CAD. The patient's family history includes Bone cancer in her father; Breast cancer in her maternal aunt; Diabetes in her maternal aunt; Hypertension in her mother; Lung cancer in her father; Ovarian cancer in her sister.    ROS:  Please see the history of present illness.  Chest pain and exertional dyspnea as outlined above.  She denies palpitations, pnd, orthopnea, n, v, dizziness, syncope, edema, weight gain, or early satiety.All other ROS reviewed and negative.     Physical Exam/Data:   Vitals:   01/13/18 1830 01/13/18 1848 01/13/18 1900 01/13/18 1915  BP: (!) 151/101 (!) 162/94 (!) 166/90   Pulse: (!) 58 (!) 52 64 70  Resp: 12 12 12 12   Temp:  98.3 F (36.8 C)    TempSrc:  Oral    SpO2: 99% 99% 100% 95%  Weight:      Height:       No intake or output data in the 24 hours ending 01/13/18  1958 Filed Weights   01/13/18 1804  Weight: 154 lb (69.9 kg)   Body mass index is 24.12 kg/m.  General:  Well nourished, well developed, in no acute distress HEENT: normal Lymph: no adenopathy Neck: Supple. no JVD, bruits. Endocrine:  No thryomegaly Vascular: No carotid bruits; FA/RA pulses 2+ bilaterally without bruits  Cardiac:  normal S1, split S2; RRR; 2/6 syst murmur @ apex. Lungs:  clear to auscultation bilaterally, no wheezing, rhonchi  or rales  Abd: soft, nontender, no hepatomegaly  Ext: no edema Musculoskeletal:  No deformities, BUE and BLE strength normal and equal Skin: warm and dry  Neuro:  CNs 2-12 intact, no focal abnormalities noted Psych:  Normal affect    EKG:  The ECG that was done 01/12/2018  was personally reviewed and demonstrates sinus brady, 59, RAD, RPFB, LAE, RBBB, inflat ST dep w/ TWI - similar to 2014 ECG.   Laboratory Data:  Chemistry Recent Labs  Lab 01/13/18 1909  NA 142  K 3.9  CL 108  CO2 26  GLUCOSE 102*  BUN 18  CREATININE 0.84  CALCIUM 9.3  GFRNONAA >60  GFRAA >60  ANIONGAP 8    Hematology Recent Labs  Lab 01/13/18 1909  WBC 5.4  RBC 3.96  HGB 13.0  HCT 39.9  MCV 100.8*  MCH 32.8  MCHC 32.6  RDW 13.2  PLT 195   Recent Labs  Lab 01/13/18 1837  TROPIPOC 0.00    Radiology/Studies:  cxr pending  Assessment and Plan:   1. Unstable Angina:  72 y/o ? w/o prior cardiac history who presents to the ED with a 2 wk h/o progressive exertional dyspnea and chest pressure.  She had recurrent symptoms this AM, prompting presentation to PCP this afternoon.  Currently, she is c/p free, though hypertensive.  ECG w/ RBBB and inflat ST/T changes - similar to 2014.  Trop nl thus far.  Plan to admit and cycle CE.  Cont NTP (placed in ED) and will add statin.  Will hold off on  blocker as HR only 59 on ecg.  No heparin unless she has recurrent c/p or rules in.  We discussed options for evaluation and have collectively decided to pursue R &  L heart cath.  The patient understands that risks include but are not limited to stroke (1 in 1000), death (1 in 14), kidney failure [usually temporary] (1 in 500), bleeding (1 in 200), allergic reaction [possibly serious] (1 in 200), and agrees to proceed. 2. Hypertension:  No prior h/o HTN however, BPs elevated here in the ED.  NTP just placed.  She says that BP was in 120's @ PCP office earlier.  Will add prn hydralazine with plan to likely add ARB following cath.  Avoiding  blocker for now in setting of baseline bradycardia. 3. Lipids:   Says that she has high HDL and borderline LDL.  F/u in AM.  Will add statin given concern for [place]. 4. Murmur:  She says that this has been present for a long time.  She describes what sounds like a RHC w/ contrast in the past, potentially looking for a shunt/ASD, etc.  Plan on echo in AM.  RHC.  Severity of Illness: The appropriate patient status for this patient is INPATIENT. Inpatient status is judged to be reasonable and necessary in order to provide the required intensity of service to ensure the patient's safety. The patient's presenting symptoms, physical exam findings, and initial radiographic and laboratory data in the context of their chronic comorbidities is felt to place them at high risk for further clinical deterioration. Furthermore, it is not anticipated that the patient will be medically stable for discharge from the hospital within 2 midnights of admission. The following factors support the patient status of inpatient.   " The patient's presenting symptoms include unstable angina. " The worrisome physical exam findings include cardiac murmur/elevated bp. " The chronic co-morbidities include RBBB, cardiac murmur   * I certify that  at the point of admission it is my clinical judgment that the patient will require inpatient hospital care spanning beyond 2 midnights from the point of admission due to high intensity of service, high risk for further  deterioration and high frequency of surveillance required.*    For questions or updates, please contact [place] Please consult [email or web address] for contact info under Cardiology/STEMI.    Signed, Murray Hodgkins, NP  01/13/2018 7:58 PM     Attending note:  Patient seen and examined.  Reviewed records and discussed the case with Mr. Sharolyn Douglas NP.  Brittany Shaffer is a 72 year old woman sent to the ER from her PCP office describing at least a 2 to 3-week history of increasing dyspnea on exertion as well as exertional chest tightness.  She relates a decrease in typical stamina, not able to play tennis more than 1 or 2 games without developing symptoms, as opposed to playing a few sets previously without difficulty, also exertional symptoms exacerbated by walking uphill.  He reports a history of heart murmur, apparently underwent some type of angiographic study many years ago, details are not clear.  She has not had a recent echocardiogram.  She does not provide any clear diagnosis of congenital heart disease or valvular heart disease based on what she can recall.  She has not had any palpitations or syncope with exertion.  Examination she appears comfortable in the ER.  She is hypertensive, does not have a standing history of hypertension and takes no specific medications for her blood pressure.  Lungs exhibit a few scattered rhonchi without labored breathing, no wheezing.  Cardiac exam reveals RRR with 2/6 murmur most noted toward the apex, also split S2.  No gallop.  She has no peripheral edema.  Lab work shows creatinine 0.84, initial troponin I 0.00, hemoglobin 13.0, platelets 195, glucose 102.  ECG shows sinus rhythm with right bundle branch block, rightward axis, also lateral ST-T wave abnormalities that are old in comparison to 2014, somewhat more prominent.  Chest x-ray reports mild cardiomegaly with mild interstitial changes.  Patient presents with increasing dyspnea on exertion with recurring  chest tightness concerning for accelerating angina.  Initial troponin I is negative however.  Her ECG is chronically abnormal with right bundle branch block, now rightward axis and persistent lateral ST-T wave abnormalities.  Also found to be hypertensive without standing diagnosis of hypertension.  Plan is admission to the hospital for further work-up to include an echocardiogram to better assess cardiac structure and function, particularly with systolic murmur and split S2 (exclude cardiomyopathy and ASD).  Cycle cardiac markers.  Also anticipate right and left heart catheterization to clearly evaluate coronary anatomy and hemodynamics in light of her recently progressive symptoms.  Risks and benefits were discussed with her, and she is in agreement to proceed.  Satira Sark, M.D., F.A.C.C.

## 2018-01-13 NOTE — ED Triage Notes (Signed)
Pt was at dr office for SOB X a couple of weeks and chest tightness. Asymptomatic at dr office. Some ekg changes at dr office so they called ems. Pt having no chest pain, no n/v 160/100, 98 ra, p 60 alert oriented x 4.

## 2018-01-14 ENCOUNTER — Inpatient Hospital Stay (HOSPITAL_COMMUNITY): Payer: PPO

## 2018-01-14 ENCOUNTER — Encounter (HOSPITAL_COMMUNITY): Payer: Self-pay | Admitting: Cardiovascular Disease

## 2018-01-14 ENCOUNTER — Ambulatory Visit (HOSPITAL_COMMUNITY)
Admission: EM | Disposition: A | Discharge status: Home / Self Care | Payer: Self-pay | Source: Home / Self Care | Attending: Emergency Medicine

## 2018-01-14 DIAGNOSIS — I2510 Atherosclerotic heart disease of native coronary artery without angina pectoris: Secondary | ICD-10-CM

## 2018-01-14 DIAGNOSIS — Z96643 Presence of artificial hip joint, bilateral: Secondary | ICD-10-CM | POA: Diagnosis not present

## 2018-01-14 DIAGNOSIS — E785 Hyperlipidemia, unspecified: Secondary | ICD-10-CM

## 2018-01-14 DIAGNOSIS — Z9889 Other specified postprocedural states: Secondary | ICD-10-CM | POA: Diagnosis not present

## 2018-01-14 DIAGNOSIS — I350 Nonrheumatic aortic (valve) stenosis: Secondary | ICD-10-CM

## 2018-01-14 DIAGNOSIS — I25110 Atherosclerotic heart disease of native coronary artery with unstable angina pectoris: Secondary | ICD-10-CM | POA: Diagnosis not present

## 2018-01-14 DIAGNOSIS — Z90710 Acquired absence of both cervix and uterus: Secondary | ICD-10-CM | POA: Diagnosis not present

## 2018-01-14 DIAGNOSIS — I420 Dilated cardiomyopathy: Secondary | ICD-10-CM | POA: Diagnosis not present

## 2018-01-14 DIAGNOSIS — I080 Rheumatic disorders of both mitral and aortic valves: Secondary | ICD-10-CM | POA: Diagnosis not present

## 2018-01-14 DIAGNOSIS — R0609 Other forms of dyspnea: Secondary | ICD-10-CM | POA: Diagnosis not present

## 2018-01-14 DIAGNOSIS — I340 Nonrheumatic mitral (valve) insufficiency: Secondary | ICD-10-CM

## 2018-01-14 DIAGNOSIS — M1990 Unspecified osteoarthritis, unspecified site: Secondary | ICD-10-CM | POA: Diagnosis not present

## 2018-01-14 DIAGNOSIS — Z90722 Acquired absence of ovaries, bilateral: Secondary | ICD-10-CM | POA: Diagnosis not present

## 2018-01-14 DIAGNOSIS — Z8249 Family history of ischemic heart disease and other diseases of the circulatory system: Secondary | ICD-10-CM | POA: Diagnosis not present

## 2018-01-14 DIAGNOSIS — I4510 Unspecified right bundle-branch block: Secondary | ICD-10-CM | POA: Diagnosis not present

## 2018-01-14 DIAGNOSIS — R011 Cardiac murmur, unspecified: Secondary | ICD-10-CM | POA: Diagnosis not present

## 2018-01-14 HISTORY — DX: Hyperlipidemia, unspecified: E78.5

## 2018-01-14 HISTORY — DX: Atherosclerotic heart disease of native coronary artery without angina pectoris: I25.10

## 2018-01-14 HISTORY — PX: RIGHT/LEFT HEART CATH AND CORONARY ANGIOGRAPHY: CATH118266

## 2018-01-14 LAB — POCT I-STAT 3, ART BLOOD GAS (G3+)
Acid-base deficit: 1 mmol/L (ref 0.0–2.0)
BICARBONATE: 23.6 mmol/L (ref 20.0–28.0)
O2 Saturation: 95 %
PH ART: 7.388 (ref 7.350–7.450)
TCO2: 25 mmol/L (ref 22–32)
pCO2 arterial: 39.3 mmHg (ref 32.0–48.0)
pO2, Arterial: 79 mmHg — ABNORMAL LOW (ref 83.0–108.0)

## 2018-01-14 LAB — POCT I-STAT 3, VENOUS BLOOD GAS (G3P V)
Acid-base deficit: 1 mmol/L (ref 0.0–2.0)
Bicarbonate: 24.9 mmol/L (ref 20.0–28.0)
Bicarbonate: 25.3 mmol/L (ref 20.0–28.0)
O2 SAT: 68 %
O2 Saturation: 69 %
PCO2 VEN: 44.3 mmHg (ref 44.0–60.0)
PCO2 VEN: 44.9 mmHg (ref 44.0–60.0)
PO2 VEN: 37 mmHg (ref 32.0–45.0)
TCO2: 26 mmol/L (ref 22–32)
TCO2: 27 mmol/L (ref 22–32)
pH, Ven: 7.358 (ref 7.250–7.430)
pH, Ven: 7.359 (ref 7.250–7.430)
pO2, Ven: 38 mmHg (ref 32.0–45.0)

## 2018-01-14 LAB — LIPID PANEL
CHOL/HDL RATIO: 2.3 ratio
Cholesterol: 193 mg/dL (ref 0–200)
HDL: 85 mg/dL (ref >40)
LDL Cholesterol: 97 mg/dL (ref 0–99)
Triglycerides: 55 mg/dL (ref <150)
VLDL: 11 mg/dL (ref 0–40)

## 2018-01-14 LAB — ECHOCARDIOGRAM COMPLETE
HEIGHTINCHES: 67.5 in
Weight: 2401.6 oz

## 2018-01-14 LAB — TROPONIN I: Troponin I: <0.03 ng/mL (ref <0.03)

## 2018-01-14 SURGERY — RIGHT/LEFT HEART CATH AND CORONARY ANGIOGRAPHY
Anesthesia: LOCAL

## 2018-01-14 MED ORDER — VERAPAMIL HCL 2.5 MG/ML IV SOLN
INTRAVENOUS | Status: AC
  Filled 2018-01-14: qty 2

## 2018-01-14 MED ORDER — HEPARIN SODIUM (PORCINE) 1000 UNIT/ML IJ SOLN
INTRAMUSCULAR | Status: DC | PRN
  Administered 2018-01-14: 3500 [IU] via INTRAVENOUS

## 2018-01-14 MED ORDER — IOHEXOL 350 MG/ML SOLN
INTRAVENOUS | Status: DC | PRN
  Administered 2018-01-14: 70 mL via INTRA_ARTERIAL

## 2018-01-14 MED ORDER — FENTANYL CITRATE (PF) 100 MCG/2ML IJ SOLN
INTRAMUSCULAR | Status: AC
  Filled 2018-01-14: qty 2

## 2018-01-14 MED ORDER — HEPARIN (PORCINE) IN NACL 1000-0.9 UT/500ML-% IV SOLN
INTRAVENOUS | Status: AC
  Filled 2018-01-14: qty 1000

## 2018-01-14 MED ORDER — VERAPAMIL HCL 2.5 MG/ML IV SOLN
INTRAVENOUS | Status: DC | PRN
  Administered 2018-01-14: 10 mL via INTRA_ARTERIAL

## 2018-01-14 MED ORDER — MIDAZOLAM HCL 2 MG/2ML IJ SOLN
INTRAMUSCULAR | Status: DC | PRN
  Administered 2018-01-14: 1 mg via INTRAVENOUS

## 2018-01-14 MED ORDER — HEPARIN (PORCINE) IN NACL 2-0.9 UNITS/ML
INTRAMUSCULAR | Status: AC | PRN
  Administered 2018-01-14 (×2): 500 mL

## 2018-01-14 MED ORDER — SODIUM CHLORIDE 0.9 % IV SOLN: 250.0000 mL | INTRAVENOUS | Status: DC | PRN

## 2018-01-14 MED ORDER — SODIUM CHLORIDE 0.9 % IV SOLN: INTRAVENOUS | Status: AC

## 2018-01-14 MED ORDER — MIDAZOLAM HCL 2 MG/2ML IJ SOLN
INTRAMUSCULAR | Status: AC
  Filled 2018-01-14: qty 2

## 2018-01-14 MED ORDER — ATORVASTATIN CALCIUM 40 MG PO TABS: 40.0000 mg | ORAL_TABLET | Freq: Every day | ORAL | 3 refills | Status: DC

## 2018-01-14 MED ORDER — SODIUM CHLORIDE 0.9% FLUSH: 3.0000 mL | INTRAVENOUS | Status: DC | PRN

## 2018-01-14 MED ORDER — SODIUM CHLORIDE 0.9% FLUSH: 3.0000 mL | Freq: Two times a day (BID) | INTRAVENOUS | Status: DC

## 2018-01-14 MED ORDER — HEPARIN SODIUM (PORCINE) 1000 UNIT/ML IJ SOLN
INTRAMUSCULAR | Status: AC
  Filled 2018-01-14: qty 1

## 2018-01-14 MED ORDER — LIDOCAINE HCL (PF) 1 % IJ SOLN
INTRAMUSCULAR | Status: AC
  Filled 2018-01-14: qty 30

## 2018-01-14 MED ORDER — ASPIRIN 81 MG PO TBEC: 81.0000 mg | DELAYED_RELEASE_TABLET | Freq: Every day | ORAL | 3 refills | Status: DC

## 2018-01-14 MED ORDER — LIDOCAINE HCL (PF) 1 % IJ SOLN
INTRAMUSCULAR | Status: DC | PRN
  Administered 2018-01-14 (×2): 2 mL via INTRADERMAL

## 2018-01-14 MED ORDER — FENTANYL CITRATE (PF) 100 MCG/2ML IJ SOLN
INTRAMUSCULAR | Status: DC | PRN
  Administered 2018-01-14: 25 ug via INTRAVENOUS

## 2018-01-14 SURGICAL SUPPLY — 17 items
CATH BALLN WEDGE 5F 110CM (CATHETERS) ×2 IMPLANT
CATH INFINITI 5FR AL1 (CATHETERS) ×2 IMPLANT
CATH INFINITI 5FR ANG PIGTAIL (CATHETERS) ×2 IMPLANT
CATH INFINITI 5FR JK (CATHETERS) ×2 IMPLANT
DEVICE RAD COMP TR BAND LRG (VASCULAR PRODUCTS) ×2 IMPLANT
GUIDEWIRE .025 260CM (WIRE) ×2 IMPLANT
GUIDEWIRE INQWIRE 1.5J.035X260 (WIRE) ×1 IMPLANT
INQWIRE 1.5J .035X260CM (WIRE) ×2
KIT HEART LEFT (KITS) ×2 IMPLANT
NEEDLE PERC 21GX4CM (NEEDLE) ×2 IMPLANT
PACK CARDIAC CATHETERIZATION (CUSTOM PROCEDURE TRAY) ×2 IMPLANT
SHEATH RAIN 4/5FR (SHEATH) ×2 IMPLANT
SHEATH RAIN RADIAL 21G 6FR (SHEATH) ×2 IMPLANT
SYR MEDRAD MARK V 150ML (SYRINGE) ×2 IMPLANT
TRANSDUCER W/STOPCOCK (MISCELLANEOUS) ×2 IMPLANT
TUBING CIL FLEX 10 FLL-RA (TUBING) ×2 IMPLANT
WIRE EMERALD ST .035X260CM (WIRE) ×2 IMPLANT

## 2018-01-14 NOTE — Progress Notes (Signed)
Patient is very upset and wants to leave. Explained that the TR Band has to be safely removed.

## 2018-01-14 NOTE — Care Management Obs Status (Signed)
[facility] [place] NOTIFICATION   Patient Details  Name: Brittany Shaffer MRN: [id number] Date of Birth: December 15, 1945   Medicare Observation Status Notification Given:  Yes    Carles Collet, RN 01/14/2018, 3:10 PM

## 2018-01-14 NOTE — Interval H&P Note (Signed)
Cath Lab Visit (complete for each Cath Lab visit)  Clinical Evaluation Leading to the Procedure:   ACS: Yes.    Non-ACS:  n/a   History and Physical Interval Note:  01/14/2018 11:16 AM  Brittany Shaffer  has presented today for surgery, with the diagnosis of HF  The various methods of treatment have been discussed with the patient and family. After consideration of risks, benefits and other options for treatment, the patient has consented to  Procedure(s): RIGHT/LEFT HEART CATH AND CORONARY ANGIOGRAPHY (N/A) as a surgical intervention .  The patient's history has been reviewed, patient examined, no change in status, stable for surgery.  I have reviewed the patient's chart and labs.  Questions were answered to the patient's satisfaction.     Kathlyn [place]

## 2018-01-14 NOTE — Care Management CC44 (Signed)
Condition Code 44 Documentation Completed  Patient Details  Name: Brittany Shaffer MRN: [id number] Date of Birth: 1945/12/07   Condition Code 44 given:  Yes Patient signature on Condition Code 44 notice:  Yes Documentation of 2 MD's agreement:  Yes Code 44 added to claim:  Yes    Carles Collet, RN 01/14/2018, 3:11 PM

## 2018-01-14 NOTE — Progress Notes (Signed)
Patient is alert and oriented waiting for TR Band Removal.  and Md plans to Discharge.

## 2018-01-14 NOTE — Progress Notes (Signed)
  Echocardiogram 2D Echocardiogram has been performed.  Jennette Dubin 01/14/2018, 10:48 AM

## 2018-01-14 NOTE — Progress Notes (Signed)
Pt is alert and oriented TR Band air is at 0 and per protocal to stay on for  post air removal patient teaching given and MD made aware.

## 2018-01-14 NOTE — Discharge Instructions (Signed)
PLEASE REMEMBER TO BRING ALL OF YOUR MEDICATIONS TO EACH OF YOUR FOLLOW-UP OFFICE VISITS.  PLEASE ATTEND ALL SCHEDULED FOLLOW-UP APPOINTMENTS.   Activity: Increase activity slowly as tolerated. You may shower, but no soaking baths (or swimming) for 1 week. No driving for 24 hours. No lifting over 5 lbs for 1 week. No sexual activity for 1 week.   You May Return to Work: in 1 week (if applicable)  Wound Care: You may wash cath site gently with soap and water. Keep cath site clean and dry. If you notice pain, swelling, bleeding or pus at your cath site, please call [phone].  As an alternative to atorvastatin, you can consider taking omega-3 (fish oil) or niacin which can be obtain over-the-counter. You can talk with your primary care provider about zetia (ezetimibe) as an alternative prescription medication for cholesterol.

## 2018-01-14 NOTE — Discharge Summary (Signed)
Discharge Summary    Patient ID: Brittany Shaffer,  MRN: [id number], DOB/AGE: 02/14/1946 72 y.o.  Admit date: 01/13/2018 Discharge date: 01/14/2018  Primary Care Provider: [name] Primary Cardiologist: None  Discharge Diagnoses    Principal Problem:   Unstable angina [facility]) Active Problems:   Nonrheumatic aortic valve stenosis   Dyslipidemia   Mild coronary artery disease   Allergies Allergies  Allergen Reactions  . Codeine Other (See Comments)    Hallucinations    Diagnostic Studies/Procedures    Echocardiogram 01/14/18: Study Conclusions  - Left ventricle: The cavity size was normal. Systolic function was normal. The estimated ejection fraction was in the range of 50% to 55%. Wall motion was normal; there were no regional wall motion abnormalities. The study is not technically sufficient to allow evaluation of LV diastolic function. - Aortic valve: Severe diffuse calcification involving the right coronary and noncoronary cusp. There was moderate stenosis. Mean gradient (S): 21 mm Hg. Valve area (VTI): 0.75 cm^2. Valve area (Vmax): 0.84 cm^2. Valve area (Vmean): 0.8 cm^2. - Mitral valve: Mild diffuse calcification of the anterior leaflet. There was mild regurgitation. - Left atrium: The atrium was severely dilated. - Right ventricle: The cavity size was mildly dilated. Wall thickness was normal. - Pulmonary arteries: PA peak pressure: 36 mm Hg (S). - Inferior vena cava: The vessel was mildly dilated.  Impressions:  - The right ventricular systolic pressure was increased consistent with mild pulmonary hypertension.  Left Heart Catheterization 01/14/18:   _____________   History of Present Illness     72 y.o. Female with PMH of a cardiac murmur and RBBB (dating back to at least 2014), and OA s/p R total hip arthroplasty 12/2016, who presented with complaints of progressive exertional dyspnea with associated chest tightness.  She is very active, walking frequently and playing tennis multiple days per week. About 5 months ago she began experiencing occasional DOE but could not associate any particular rhyme/reason to it and she was able to maintain her active lifestyle. However, over the past 2 weeks, she has been experiencing frequent bouts of exertional dyspnea and chest pressure, often while playing tennis or walking up inclines, which lasts for 5-10 minutes, and resolves with rest. She has not had any symptoms at rest or at night. She denies PND, orthopnea, nausea, vomiting, dizziness, syncope, edema, or early satiety. Due to progressive and ongoing symptoms, she saw her PCP 01/13/18 who recommended ER evaluation. She was chest pain free at the time of admission and noted to have elevated BPS (160s/90s). Initial labs were unremarkable.    Hospital Course     Consultants: None   1. Exertional dyspnea and chest pain: p/w progressive DOE and chest pressure. No prior cardiac history. She was given ASA 325 and nitro paste in the ED. Troponin negative x3 and EKG with chronic RBBB and non-specifict ST/T abnormalities. Echo revealed EF 50-55%, no wall motion abnormalities, unable to assess LV diastolic function, moderate AS, and mild pulm HTN. Symptoms c/f unstable angina and decision made to pursue [facility] which revealed near normal coronary arteries with only moderate stenosis (50%) in the apical LAD, with high normal filling pressures, normal pulmonary pressure, normal cardiac output, mild AS, and moderate MR. She was recommended to continue medial therapy.  - Continue ASA and statin at discharge  2. Elevated BP: BP elevated on admission, improved this morning. Patient reports being normotensive at PCP visits.  - Could consider starting ARB if Cr stable post cath  and BP remains elevated outpatient. Will defer management to PCP. - Avoid BBlockers due to baseline bradycardia   3. Aortic stenosis: echo today with moderate AS,  although thought to be mild on RHC today. Doubtful this is contributing to her symptoms - No further evaluation at this time.   4. Mitral regurgitation: reported as moderate MR on RHC today.  - No further evaluation at this time  5. Dyslipidemia: LDL 97; goal <70. Atorvastatin 40mg  daily started inpatient - Recommended statin therapy at discharge, however patient refused due to fear of side effects. Suggested omega-3 or niacin as alternatives.  Recommend outpatient follow-up with PCP in 1-2 weeks.  _____________  Discharge Vitals Blood pressure 105/89, pulse 62, temperature 97.6 F (36.4 C), temperature source Oral, resp. rate (!) 47, height 5' 7.5" (1.715 m), weight 150 lb 1.6 oz (68.1 kg), SpO2 95 %.  Filed Weights   01/13/18 1804 01/13/18 2124 01/14/18 0700  Weight: 154 lb (69.9 kg) 150 lb 11.2 oz (68.4 kg) 150 lb 1.6 oz (68.1 kg)   Right radial cath site with some ecchymosis but no significant hematoma or bleeding.     Labs & Radiologic Studies    CBC Recent Labs    01/13/18 1909 01/13/18 2040  WBC 5.4 5.3  NEUTROABS 2.9  --   HGB 13.0 13.5  HCT 39.9 40.5  MCV 100.8* 99.8  PLT 195 573   Basic Metabolic Panel Recent Labs    01/13/18 1909 01/13/18 2040  NA 142  --   K 3.9  --   CL 108  --   CO2 26  --   GLUCOSE 102*  --   BUN 18  --   CREATININE 0.84 0.80  CALCIUM 9.3  --    Liver Function Tests No results for input(s): AST, ALT, ALKPHOS, BILITOT, PROT, ALBUMIN in the last 72 hours. No results for input(s): LIPASE, AMYLASE in the last 72 hours. Cardiac Enzymes Recent Labs    01/13/18 2040 01/14/18 0212 01/14/18 0811  TROPONINI <0.03 <0.03 <0.03   BNP Invalid input(s): POCBNP D-Dimer No results for input(s): DDIMER in the last 72 hours. Hemoglobin A1C No results for input(s): HGBA1C in the last 72 hours. Fasting Lipid Panel Recent Labs    01/14/18 0212  CHOL 193  HDL 85  LDLCALC 97  TRIG 55  CHOLHDL 2.3   Thyroid Function Tests No  results for input(s): TSH, T4TOTAL, T3FREE, THYROIDAB in the last 72 hours.  Invalid input(s): FREET3 _____________  Dg Chest Port 1 View  Result Date: 01/13/2018 CLINICAL DATA:  Chest tightness EXAM: PORTABLE CHEST 1 VIEW COMPARISON:  02/28/2012 FINDINGS: Hyperinflation. Cardiomegaly. Mild diffuse interstitial opacity. No pleural effusion. No pneumothorax. Scoliosis of the spine. IMPRESSION: 1. Mild cardiomegaly 2. Mild diffuse increased interstitial opacity bilaterally may reflect minimal edema or interstitial inflammation, suspect that there may be component of underlying chronic interstitial change. Electronically Signed   By: Donavan Foil M.D.   On: 01/13/2018 20:34   Disposition   Patient was seen and examined by Dr. [name] who deemed patient as stable for discharge. Follow-up has been arranged. Discharge medications as listed below.   Follow-up Plans & Appointments    Follow-up Information    Hulan Fess, MD Follow up.   Specialty:  Family Medicine Why:  Please call to schedule an appointment in 1-2 weeks  Contact information: [place] [address] [phone]            Discharge Medications  Allergies as of 01/14/2018      Reactions   Codeine Other (See Comments)   Hallucinations      Medication List    TAKE these medications   aspirin 81 MG EC tablet Take 1 tablet (81 mg total) by mouth daily. Start taking on:  01/15/2018   atorvastatin 40 MG tablet Commonly known as:  LIPITOR Take 1 tablet (40 mg total) by mouth daily at 6 PM.   estradiol 0.025 mg/24hr patch Commonly known as:  CLIMARA - Dosed in mg/24 hr PLACE 1 PATCH (0.025 MG TOTAL) ONTO THE SKIN ONCE A WEEK.   etodolac 500 MG tablet Commonly known as:  LODINE Take 500 mg by mouth daily.   UNABLE TO FIND Hyland's Leg Cramps tablets: Take 1 tablet by mouth during the night as needed for leg cramps   zolpidem 10 MG tablet Commonly known as:  AMBIEN Take 1 tablet (10 mg  total) by mouth at bedtime as needed. For sleep What changed:    how much to take  reasons to take this  additional instructions          Outstanding Labs/Studies   None  Duration of Discharge Encounter   Greater than 30 minutes including physician time.  Signed, Abigail Butts PA-C 01/14/2018, 12:47 PM

## 2018-01-15 MED FILL — Heparin Sod (Porcine)-NaCl IV Soln 1000 Unit/500ML-0.9%: INTRAVENOUS | Qty: 1000 | Status: AC

## 2018-01-19 DIAGNOSIS — Z96641 Presence of right artificial hip joint: Secondary | ICD-10-CM | POA: Diagnosis not present

## 2018-01-19 DIAGNOSIS — Z96642 Presence of left artificial hip joint: Secondary | ICD-10-CM | POA: Diagnosis not present

## 2018-01-19 DIAGNOSIS — M47896 Other spondylosis, lumbar region: Secondary | ICD-10-CM | POA: Diagnosis not present

## 2018-03-11 ENCOUNTER — Encounter: Payer: Self-pay | Admitting: Gynecology

## 2018-03-11 ENCOUNTER — Ambulatory Visit: Payer: PPO | Admitting: Gynecology

## 2018-03-11 VITALS — BP 118/74 | Ht 68.0 in | Wt 152.0 lb

## 2018-03-11 DIAGNOSIS — Z01419 Encounter for gynecological examination (general) (routine) without abnormal findings: Secondary | ICD-10-CM

## 2018-03-11 DIAGNOSIS — Z79890 Hormone replacement therapy (postmenopausal): Secondary | ICD-10-CM

## 2018-03-11 DIAGNOSIS — N952 Postmenopausal atrophic vaginitis: Secondary | ICD-10-CM

## 2018-03-11 MED ORDER — ESTRADIOL 0.025 MG/24HR TD PTWK: MEDICATED_PATCH | TRANSDERMAL | 12 refills | Status: DC

## 2018-03-11 MED ORDER — ZOLPIDEM TARTRATE 10 MG PO TABS: 10.0000 mg | ORAL_TABLET | Freq: Every evening | ORAL | 1 refills | Status: DC | PRN

## 2018-03-11 NOTE — Progress Notes (Signed)
    KILEY SOLIMINE 1945-09-30 [id number]        72 y.o.  [id number] for breast and pelvic exam.  Doing well without gynecologic complaints.  Past medical history,surgical history, problem list, medications, allergies, family history and social history were all reviewed and documented as reviewed in the EPIC chart.  ROS:  Performed with pertinent positives and negatives included in the history, assessment and plan.   Additional significant findings : None   Exam: Caryn Bee assistant Vitals:   03/11/18 1601  BP: 118/74  Weight: 152 lb (68.9 kg)  Height: 5\' 8"  (1.727 m)   Body mass index is 23.11 kg/m.  General appearance:  Normal affect, orientation and appearance. Skin: Grossly normal HEENT: Without gross lesions.  No cervical or supraclavicular adenopathy. Thyroid normal.  Lungs:  Clear without wheezing, rales or rhonchi Cardiac: RR, without RMG Abdominal:  Soft, nontender, without masses, guarding, rebound, organomegaly or hernia Breasts:  Examined lying and sitting without masses, retractions, discharge or axillary adenopathy. Pelvic:  Ext, BUS, Vagina: With atrophic changes  Adnexa: Without masses or tenderness    Anus and perineum: Normal   Rectovaginal: Normal sphincter tone without palpated masses or tenderness.    Assessment/Plan:  72 y.o. [id number] female for breast and pelvic exam.   1. Postmenopausal/atrophic genital changes/ERT.  Continues on Climara 0.025 mg patch.  Feels better when she is on this.  We again discussed the risks versus benefits particularly in a patient in her 24s.  Thrombosis such as stroke heart attack DVT in the breast cancer issue was reviewed.  Benefits to include cardiovascular, bone health and symptom relief also discussed.  At this point the patient is comfortable continuing and I refilled her x1 year. 2. Uses Ambien one half of 10 mg tablet intermittently for sleep.  #30 with 1 refill provided.  Has no side effects from this. 3. Mammography  06/2017.  Continue with annual mammography this coming fall.  Breast exam normal today. 4. Pap smear 2011.  No Pap smear done today.  No history of significant abnormal Pap smears.  We both agree to stop screening based on age and hysterectomy history. 5. DEXA listed at 2014 although patient feels she had a in 2016.  We will check with [facility] to see when it was done.  We will plan on a 5-year interval assuming it was normal. 6. Colonoscopy 2011.  Patient reports a 10-year repeat interval recommendation.  She is going to discuss Cologuard with her primary physician. 7. Health maintenance.  No routine lab work done as patient does this elsewhere.  Follow-up 1 year, sooner as needed.   Anastasio Auerbach MD, 4:46 PM 03/11/2018

## 2018-03-11 NOTE — Patient Instructions (Signed)
Follow-up in 1 year for annual exam  Verify that you are up-to-date with your bone density.

## 2018-03-12 ENCOUNTER — Telehealth: Payer: Self-pay | Admitting: *Deleted

## 2018-03-12 DIAGNOSIS — Z780 Asymptomatic menopausal state: Secondary | ICD-10-CM

## 2018-03-12 NOTE — Telephone Encounter (Signed)
Left on voicemail to call and schedule dexa here, order placed.

## 2018-03-12 NOTE — Telephone Encounter (Signed)
-----   Message from Anastasio Auerbach, MD sent at 03/12/2018 10:07 AM EDT ----- Tell patient her last bone density was 5 years ago.  It was done here and I would like you to go ahead and reschedule bone density now here.  The last when she had at [facility] was 2010. ----- Message ----- From: Thamas Jaegers, RMA Sent: 03/12/2018   9:56 AM To: Anastasio Auerbach, MD  Last dexa at [facility] was June 2010 ----- Message ----- From: Anastasio Auerbach, MD Sent: 03/11/2018   4:49 PM To: Thamas Jaegers, RMA  Check to see when the patient's last bone density was done at [facility].  She thinks that it was closer than 2014 which is what we have is a report.

## 2018-07-28 DIAGNOSIS — R0602 Shortness of breath: Secondary | ICD-10-CM | POA: Diagnosis not present

## 2018-07-28 DIAGNOSIS — R05 Cough: Secondary | ICD-10-CM | POA: Diagnosis not present

## 2018-08-04 ENCOUNTER — Encounter: Payer: Self-pay | Admitting: Pulmonary Disease

## 2018-08-04 ENCOUNTER — Ambulatory Visit: Payer: PPO | Admitting: Pulmonary Disease

## 2018-08-04 DIAGNOSIS — J849 Interstitial pulmonary disease, unspecified: Secondary | ICD-10-CM | POA: Diagnosis not present

## 2018-08-04 NOTE — Progress Notes (Signed)
Brittany Shaffer    [id number]    April 22, 1946  Primary Care Physician:[name], [name], MD  Referring Physician: [name], MD [place], [name] [address]  Chief complaint: Consult for dyspnea, Eval for ILD  HPI: 72 year old with history of aortic stenosis, mild coronary artery disease, hyperlipidemia Complains of dyspnea on exertion for the past 1 year.  She used to be active in 2018.  Has noticed reduced exercise capacity since early 2018.  She has difficulty playing tennis or climbing up inclines.  No symptoms at rest Notes mild wheeze while lying down.   Had a recent chest x-ray which showed mild interstitial prominence.  Her father died of lung cancer and she wants her lungs checked out.  Hospitalized and May 2019 for chest discomfort and underwent right and left heart catheterization which showed aortic valve stenosis, mild coronary artery disease.  Noted to have high normal LV filling pressures with moderate MR and no evidence of pulmonary hypertension.  Medical management was recommended.  Pets: Has a cat, no dogs, birds, farm animals Occupation: Used to work in Press photographer for [employer] Exposures: Does not use down comforter, no mold, hot tub, Jacuzzi. Smoking history: Social smoker.  Quit in the year 2000 Travel history: No significant recent travel Relevant family history: Father died of lung cancer.  He was a smoker.  Outpatient Encounter Medications as of 08/04/2018  Medication Sig  . aspirin EC 81 MG EC tablet Take 1 tablet (81 mg total) by mouth daily.  [name] estradiol (CLIMARA - DOSED IN MG/24 HR) 0.025 mg/24hr patch PLACE 1 PATCH (0.025 MG TOTAL) ONTO THE SKIN ONCE A WEEK.  [name] etodolac (LODINE) 500 MG tablet Take 500 mg by mouth daily.  [name] ezetimibe (ZETIA) 10 MG tablet Take 10 mg by mouth daily.  [name] UNABLE TO FIND Hyland's Leg Cramps tablets: Take 1 tablet by mouth during the night as needed for leg cramps  . zolpidem (AMBIEN) 10 MG tablet  Take 1 tablet (10 mg total) by mouth at bedtime as needed. For sleep   No facility-administered encounter medications on file as of 08/04/2018.     Allergies as of 08/04/2018 - Review Complete 08/04/2018  Allergen Reaction Noted  . Codeine Other (See Comments) 02/28/2012    Past Medical History:  Diagnosis Date  . Arthritis    "was in my hips; still in my knees" (01/13/2018)  . Heart murmur   . Leg cramps     Past Surgical History:  Procedure Laterality Date  . ABDOMINAL HYSTERECTOMY  03/03/2012   Procedure: HYSTERECTOMY ABDOMINAL;  Surgeon: Alvino Chapel, MD;  Location: [facility];  Service: Gynecology;  Laterality: N/A;  . BLEPHAROPLASTY Bilateral   . JOINT REPLACEMENT    . 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 RIGHT/LEFT HEART CATH AND CORONARY ANGIOGRAPHY N/A 01/14/2018   Procedure: RIGHT/LEFT HEART CATH AND CORONARY ANGIOGRAPHY;  Surgeon: Wellington Hampshire, MD;  Location: [facility];  Service: Cardiovascular;  Laterality: N/A;  . SALPINGOOPHORECTOMY  03/03/2012   Procedure: SALPINGO OOPHERECTOMY;  Surgeon: Alvino Chapel, MD;  Location: [facility];  Service: Gynecology;  Laterality: Bilateral;  . TONSILLECTOMY    . TOTAL HIP ARTHROPLASTY Left 03/24/2013   Procedure: LEFT TOTAL HIP ARTHROPLASTY ANTERIOR APPROACH;  Surgeon: [name], MD;  Location:  [facility];  Service: Orthopedics;  Laterality: Left;  . TOTAL HIP ARTHROPLASTY Right 12/18/2016   Procedure: RIGHT TOTAL HIP ARTHROPLASTY ANTERIOR APPROACH;  Surgeon: Gaynelle Arabian, MD;  Location: [facility];  Service: Orthopedics;  Laterality: Right;  . TUBAL LIGATION      Family History  Problem Relation Age of Onset  . Hypertension Mother   . Bone cancer Father   . Lung cancer Father   . Ovarian cancer Sister   . Diabetes Maternal Aunt   . Breast cancer Maternal  Aunt        Age 77's    Social History   Socioeconomic History  . Marital status: Widowed    Spouse name: Not on file  . Number of children: Not on file  . Years of education: Not on file  . Highest education level: Not on file  Occupational History  . Not on file  Social Needs  . Financial resource strain: Not on file  . Food insecurity:    Worry: Not on file    Inability: Not on file  . Transportation needs:    Medical: Not on file    Non-medical: Not on file  Tobacco Use  . Smoking status: Never Smoker  . Smokeless tobacco: Never Used  Substance and Sexual Activity  . Alcohol use: Yes    Alcohol/week: 7.0 standard drinks    Types: 7 Standard drinks or equivalent per week    Comment: 1 / PER DAY  . Drug use: No  . Sexual activity: Not Currently    Birth control/protection: Surgical    Comment: 1st intercourse 72 yo-Fewer than 5 partners  Lifestyle  . Physical activity:    Days per week: Not on file    Minutes per session: Not on file  . Stress: Not on file  Relationships  . Social connections:    Talks on phone: Not on file    Gets together: Not on file    Attends religious service: Not on file    Active member of club or organization: Not on file    Attends meetings of clubs or organizations: Not on file    Relationship status: Not on file  . Intimate partner violence:    Fear of current or ex partner: Not on file    Emotionally abused: Not on file    Physically abused: Not on file    Forced sexual activity: Not on file  Other Topics Concern  . Not on file  Social History Narrative   Lives locally.  Retired. Active - walks frequently and plays tennis regularly.    Review of systems: Review of Systems  Constitutional: Negative for fever and chills.  HENT: Negative.   Eyes: Negative for blurred vision.  Respiratory: as per HPI  Cardiovascular: Negative for chest pain and palpitations.  Gastrointestinal: Negative for vomiting, diarrhea, blood per  rectum. Genitourinary: Negative for dysuria, urgency, frequency and hematuria.  Musculoskeletal: Negative for myalgias, back pain and joint pain.  Skin: Negative for itching and rash.  Neurological: Negative for dizziness, tremors, focal weakness, seizures and loss of consciousness.  Endo/Heme/Allergies: Negative for environmental allergies.  Psychiatric/Behavioral: Negative for depression, suicidal ideas and hallucinations.  All other systems reviewed and are negative.  Physical Exam: Blood pressure (!) 156/88, pulse 88, height 5\' 6"  (1.676 m), weight 150 lb 12.8 oz (68.4 kg), SpO2 99 %. Gen:      No acute distress HEENT:  EOMI, sclera anicteric Neck:     No masses; no  thyromegaly Lungs:    Clear to auscultation bilaterally; normal respiratory effort CV:         Regular rate and rhythm; no murmurs Abd:      + bowel sounds; soft, non-tender; no palpable masses, no distension Ext:    No edema; adequate peripheral perfusion Skin:      Warm and dry; no rash Neuro: alert and oriented x 3 Psych: normal mood and affect  Data Reviewed: Imaging: Chest x-ray 01/13/2018- mild cardiomegaly, mild diffuse increased interstitial opacity. I have reviewed the images personally  PFTs: Pending  Labs: CBC 01/13/2018-WBC 5.4, eos 2%, absolute eosinophil count 108  Cardiac: Cath 01/14/18 1.  Near normal coronary arteries with only moderate stenosis in the apical LAD. 2.  Right heart catheterization showed high normal filling pressures, normal pulmonary pressure and normal cardiac output. 3.  Mild aortic stenosis with a mean gradient of 7 mmHg and valve area of 2.7 cm.   Mean PA pressure 20 mm, wedge pressure 13 mm, cardiac output 5.01 Pulmonary vascular resistance 112, Woods units 1.4  Echo 01/14/18 The right ventricular systolic pressure was increased consistent with mild pulmonary hypertension. PAP 36  Assessment:  Evaluation for dyspnea Chest x-ray this year noted with mild intersitial  prominence.  There is no known exposures or signs and symptoms of connective tissue disease Will get high-resolution CT and pulmonary function test for better evaluation.  Plan/Recommendations: - High res CT, PFTs  Marshell Garfinkel MD [facility] 08/04/2018, 10:24 AM  CC: [name], MD

## 2018-08-04 NOTE — Patient Instructions (Signed)
Schedule you for high-resolution CT and pulmonary function test for complete evaluation of the lungs Follow-up in clinic after test to review and discuss further steps as needed.

## 2018-08-07 ENCOUNTER — Ambulatory Visit (INDEPENDENT_AMBULATORY_CARE_PROVIDER_SITE_OTHER)
Admission: RE | Admit: 2018-08-07 | Discharge: 2018-08-07 | Disposition: A | Discharge status: Home / Self Care | Payer: PPO | Source: Ambulatory Visit | Attending: Pulmonary Disease | Admitting: Pulmonary Disease

## 2018-08-07 DIAGNOSIS — J849 Interstitial pulmonary disease, unspecified: Secondary | ICD-10-CM | POA: Diagnosis not present

## 2018-08-07 DIAGNOSIS — R918 Other nonspecific abnormal finding of lung field: Secondary | ICD-10-CM | POA: Diagnosis not present

## 2018-08-10 ENCOUNTER — Telehealth: Payer: Self-pay | Admitting: Pulmonary Disease

## 2018-08-10 DIAGNOSIS — R911 Solitary pulmonary nodule: Secondary | ICD-10-CM

## 2018-08-10 NOTE — Telephone Encounter (Signed)
Patient calling to request results from the 12.6.19 CT Chest High Res Patient is aware message is being routed to Dr Vaughan Browner to advise on results  Dr Vaughan Browner please advise, thank you

## 2018-08-10 NOTE — Telephone Encounter (Signed)
Called and spoke with pt letting her know the results of the CT scan. Stated to pt that we would repeat the scan in 1 year to follow up on the nodules that were seen on the scan. Also stated to pt that Dr. Vaughan Browner would discuss scan in more detail as well as the PFT results at her OV with him. Pt expressed understanding. Order has been placed for the CT to be repeated in 1 year. Nothing further needed.

## 2018-08-10 NOTE — Telephone Encounter (Signed)
CT does not show any interstitial lung disease or evidence of malignancy  There are small lung nodules that will need follow-up.  Order CT chest without contrast in 1 year In addition there is mild enlargement of the heart and fluid buildup.  There is something called at trapping which indicates minor airway inflammation We will discuss in detail at time of return visit after Brittany Shaffer gets PFTs

## 2018-09-10 ENCOUNTER — Ambulatory Visit (INDEPENDENT_AMBULATORY_CARE_PROVIDER_SITE_OTHER): Payer: PPO | Admitting: Pulmonary Disease

## 2018-09-10 DIAGNOSIS — J849 Interstitial pulmonary disease, unspecified: Secondary | ICD-10-CM | POA: Diagnosis not present

## 2018-09-10 LAB — PULMONARY FUNCTION TEST
DL/VA % PRED: 98 %
DL/VA: 5.14 ml/min/mmHg/L
DLCO unc % pred: 63 %
DLCO unc: 18.64 ml/min/mmHg
FEF 25-75 Post: 1.97 L/sec
FEF 25-75 Pre: 1.44 L/sec
FEF2575-%Change-Post: 36 %
FEF2575-%Pred-Post: 101 %
FEF2575-%Pred-Pre: 74 %
FEV1-%CHANGE-POST: 7 %
FEV1-%PRED-PRE: 61 %
FEV1-%Pred-Post: 65 %
FEV1-PRE: 1.54 L
FEV1-Post: 1.65 L
FEV1FVC-%CHANGE-POST: 8 %
FEV1FVC-%Pred-Pre: 105 %
FEV6-%Change-Post: -1 %
FEV6-%Pred-Post: 60 %
FEV6-%Pred-Pre: 61 %
FEV6-PRE: 1.96 L
FEV6-Post: 1.93 L
FEV6FVC-%Pred-Post: 104 %
FEV6FVC-%Pred-Pre: 104 %
FVC-%CHANGE-POST: -1 %
FVC-%PRED-POST: 58 %
FVC-%PRED-PRE: 58 %
FVC-POST: 1.93 L
FVC-Pre: 1.96 L
PRE FEV1/FVC RATIO: 79 %
Post FEV1/FVC ratio: 86 %
Post FEV6/FVC ratio: 100 %
Pre FEV6/FVC Ratio: 100 %
RV % pred: 87 %
RV: 2.14 L
TLC % PRED: 74 %
TLC: 4.17 L

## 2018-09-10 NOTE — Progress Notes (Signed)
PFT done today. 

## 2018-09-22 ENCOUNTER — Ambulatory Visit (INDEPENDENT_AMBULATORY_CARE_PROVIDER_SITE_OTHER): Payer: PPO | Admitting: Pulmonary Disease

## 2018-09-22 ENCOUNTER — Encounter: Payer: Self-pay | Admitting: Pulmonary Disease

## 2018-09-22 VITALS — BP 124/80 | HR 68 | Ht 66.0 in | Wt 150.0 lb

## 2018-09-22 DIAGNOSIS — R0602 Shortness of breath: Secondary | ICD-10-CM | POA: Diagnosis not present

## 2018-09-22 DIAGNOSIS — J849 Interstitial pulmonary disease, unspecified: Secondary | ICD-10-CM

## 2018-09-22 LAB — CBC WITH DIFFERENTIAL/PLATELET
BASOS PCT: 1.3 % (ref 0.0–3.0)
Basophils Absolute: 0.1 10*3/uL (ref 0.0–0.1)
EOS PCT: 3.2 % (ref 0.0–5.0)
Eosinophils Absolute: 0.2 10*3/uL (ref 0.0–0.7)
HEMATOCRIT: 40.7 % (ref 36.0–46.0)
HEMOGLOBIN: 13.4 g/dL (ref 12.0–15.0)
Lymphocytes Relative: 21.8 % (ref 12.0–46.0)
Lymphs Abs: 1.2 10*3/uL (ref 0.7–4.0)
MCHC: 33.1 g/dL (ref 30.0–36.0)
MCV: 101.6 fl — ABNORMAL HIGH (ref 78.0–100.0)
MONO ABS: 0.6 10*3/uL (ref 0.1–1.0)
Monocytes Relative: 10.5 % (ref 3.0–12.0)
Neutro Abs: 3.5 10*3/uL (ref 1.4–7.7)
Neutrophils Relative %: 63.2 % (ref 43.0–77.0)
Platelets: 105 10*3/uL — ABNORMAL LOW (ref 150.0–400.0)
RBC: 4 Mil/uL (ref 3.87–5.11)
RDW: 13.7 % (ref 11.5–15.5)
WBC: 5.5 10*3/uL (ref 4.0–10.5)

## 2018-09-22 LAB — NITRIC OXIDE: Nitric Oxide: 22

## 2018-09-22 MED ORDER — BUDESONIDE-FORMOTEROL FUMARATE 160-4.5 MCG/ACT IN AERO: 2.0000 | INHALATION_SPRAY | Freq: Two times a day (BID) | RESPIRATORY_TRACT | 0 refills | Status: DC

## 2018-09-22 NOTE — Progress Notes (Signed)
Patient seen in the office today and instructed on use of symbicort 160.  Patient expressed understanding and demonstrated technique.

## 2018-09-22 NOTE — Patient Instructions (Signed)
We will check a CBC differential, FENO and IgE Started on Symbicort 160 twice daily I think is a good idea to ask your primary care for cardiology referral.

## 2018-09-22 NOTE — Progress Notes (Signed)
Brittany Shaffer    [id number]    Sep 19, 1945  Primary Care Physician:[name], [name], MD  Referring Physician: [name], MD [place], [place] [id number]  Chief complaint: Consult for dyspnea, Eval for ILD  HPI: 73 year old with history of aortic stenosis, mild coronary artery disease, hyperlipidemia Complains of dyspnea on exertion for the past 1 year.  She used to be active in 2018.  Has noticed reduced exercise capacity since early 2018.  She has difficulty playing tennis or climbing up inclines.  No symptoms at rest Notes mild wheeze while lying down.   Had a recent chest x-ray which showed mild interstitial prominence.  Her father died of lung cancer and she wants her lungs checked out.  Hospitalized and May 2019 for chest discomfort and underwent right and left heart catheterization which showed aortic valve stenosis, mild coronary artery disease.  Noted to have high normal LV filling pressures with moderate MR and no evidence of pulmonary hypertension.  Medical management was recommended.  Pets: Has a cat, no dogs, birds, farm animals Occupation: Used to work in Press photographer for [employer] Exposures: Does not use down comforter, no mold, hot tub, Jacuzzi. Smoking history: Social smoker.  Quit in the year 2000 Travel history: No significant recent travel Relevant family history: Father died of lung cancer.  He was a smoker.  Interim history: Here for review of CT and PFTs.  States that dyspnea on exertion is unchanged.  No cough, sputum abrasion, wheezing.  Outpatient Encounter Medications as of 09/22/2018  Medication Sig  . estradiol (CLIMARA - DOSED IN MG/24 HR) 0.025 mg/24hr patch PLACE 1 PATCH (0.025 MG TOTAL) ONTO THE SKIN ONCE A WEEK.  [name] etodolac (LODINE) 500 MG tablet Take 500 mg by mouth daily.  [name] ezetimibe (ZETIA) 10 MG tablet Take 10 mg by mouth daily.  [name] UNABLE TO FIND Hyland's Leg Cramps tablets: Take 1 tablet by mouth during the  night as needed for leg cramps  . zolpidem (AMBIEN) 10 MG tablet Take 1 tablet (10 mg total) by mouth at bedtime as needed. For sleep  . [DISCONTINUED] aspirin EC 81 MG EC tablet Take 1 tablet (81 mg total) by mouth daily. (Patient not taking: Reported on 09/22/2018)   No facility-administered encounter medications on file as of 09/22/2018.    Physical Exam: Blood pressure 124/80, pulse 68, height 5\' 6"  (1.676 m), weight 150 lb (68 kg), SpO2 99 %. Gen:      No acute distress HEENT:  EOMI, sclera anicteric Neck:     No masses; no thyromegaly Lungs:    Clear to auscultation bilaterally; normal respiratory effort CV:         Regular rate and rhythm; no murmurs Abd:      + bowel sounds; soft, non-tender; no palpable masses, no distension Ext:    No edema; adequate peripheral perfusion Skin:      Warm and dry; no rash Neuro: alert and oriented x 3 Psych: normal mood and affect  Data Reviewed: Imaging: Chest x-ray 01/13/2018- mild cardiomegaly, mild diffuse increased interstitial opacity. High-res CT 08/07/2018-moderate air-trapping, cardiomegaly with mild pulmonary edema, nonspecific postinflammatory scarring at the base.  No interstitial lung disease.  Scattered small pulmonary nodules. I reviewed the images personally  PFTs: 09/10/2018 FVC 1.93 [58%], FEV1 1.65 [65%], F/F 86, TLC 74%, DLCO 63%, DLCO/VA 98% Moderate restriction and diffusion defect  FENO 09/22/2018-22  Labs: CBC 01/13/2018-WBC 5.4, eos 2%, absolute  eosinophil count 108  Cardiac: Cath 01/14/18 1.  Near normal coronary arteries with only moderate stenosis in the apical LAD. 2.  Right heart catheterization showed high normal filling pressures, normal pulmonary pressure and normal cardiac output. 3.  Mild aortic stenosis with a mean gradient of 7 mmHg and valve area of 2.7 cm.   Mean PA pressure 20 mm, wedge pressure 13 mm, cardiac output 5.01 Pulmonary vascular resistance 112, Woods units 1.4  Echo 01/14/18 The right  ventricular systolic pressure was increased consistent with mild pulmonary hypertension. PAP 36  Assessment:  Evaluation for dyspnea CT scan and PFTs reviewed.  There is some restriction and diffusion impairment however no clear evidence of interstitial lung disease on high-resolution CT.  There is evidence of vascular congestion and pulmonary edema on imaging.  There is also evidence of small airways disease with reduction in mid flow rates and air trapping  Suspect that her dyspnea is a combination of mild asthma and also cardiac issues with aortic stenosis, vascular congestion and coronary artery disease We will check a CBC differential, IgE. I will start her on Symbicort  I have recommended a cardiology referral and she is planning to discuss with her primary care.  Plan/Recommendations: - CBC, IgE - Symbicort - Cardiology referral  Marshell Garfinkel MD [facility] 09/22/2018, 9:03 AM  CC: [name], MD

## 2018-09-23 ENCOUNTER — Telehealth: Payer: Self-pay | Admitting: Pulmonary Disease

## 2018-09-23 LAB — IGE: IgE (Immunoglobulin E), Serum: 48 kU/L (ref <=114)

## 2018-09-23 NOTE — Telephone Encounter (Signed)
Call made to patient, made aware she can stop the inhaler. Offered an appt, patient declined stating she would wait until after she seen Dr. Harrington Challenger. Nothing further is needed at this time.

## 2018-09-23 NOTE — Telephone Encounter (Signed)
Called and spoke with patient.  She stated that Symbicort inhaler is not working well for her and she would like to stop taking it.  She is complaining of increased wheezing, throat irritation, that is making her cough more.  She stated that after the second dose, her stomach hurt.  She is wanting to stop inhaler.  Dr. Vaughan Browner, please advise on Symbicort inhaler

## 2018-09-23 NOTE — Telephone Encounter (Signed)
Ok to stop the inhaler.

## 2018-09-25 ENCOUNTER — Telehealth: Payer: Self-pay | Admitting: Pulmonary Disease

## 2018-09-25 MED ORDER — ALBUTEROL SULFATE HFA 108 (90 BASE) MCG/ACT IN AERS: 2.0000 | INHALATION_SPRAY | Freq: Four times a day (QID) | RESPIRATORY_TRACT | 2 refills | Status: DC | PRN

## 2018-09-25 NOTE — Telephone Encounter (Signed)
Okay to prescribe albuterol rescue inhaler

## 2018-09-25 NOTE — Telephone Encounter (Signed)
Called and spoke with Patient.  She is requesting another kind of inhaler.  She stated that she got very winded playing tennis today, during the first of her game.  She stated that someone suggested she may have exercise induced asthma.  She was wondering if Dr. Vaughan Browner recommended a albuterol inhaler?   Dr. Vaughan Browner, please advise on inhaler

## 2018-10-01 DIAGNOSIS — I208 Other forms of angina pectoris: Secondary | ICD-10-CM | POA: Diagnosis not present

## 2018-10-02 NOTE — Progress Notes (Signed)
Cardiology Office Note   Date:  10/05/2018   ID:  Erma, Joubert August 23, 1946, MRN [id number]  PCP:  [name], MD  Cardiologist:   Tamim Skog [name], MD   Chief Complaint  Patient presents with  . Shortness of Breath      History of Present Illness: Brittany Shaffer is a 73 y.o. female who is seen at the request of Dr. Harrington Challenger for evaluation of dyspnea. She has a history of RBBB and murmur. She was evaluated by our service in May 2019 when she presented with dyspnea and chest pain. Troponins were negative. Echo showed moderate calcific aortic stenosis. MAC, EF 50-55% and mild pulmonary HTN. She underwent R/LHC which revealed near normal coronary arteries with only moderate stenosis (50%) in the apical LAD, with high normal filling pressures, normal pulmonary pressure, normal cardiac output, mild AS, and moderate MR. AV gradient was 7 mm at cath. She was recommended to continue medial therapy.   She reports that her dyspnea started in May 2019. It has persisted since then. She was tried on inhalers without improvement. She notes DOE such as playing tennis, walking up an incline, taking her trash out, or even walking across her home. She walks daily and notes she now needs to stop 3-4 times on her walk. She has chronic swelling in her left leg> right. She had a bad cold in December with cough but this has resolved. She feels worn out. At night she sometimes has to prop up to breath and at times sleeps in a recliner.   She has been evaluated by Pulmonary- Dr Wonda Amis. CT chest showed no ILD but was consistent with mild edema. PFTs showed  moderate restriction and moderate diffusion abnormality.     Past Medical History:  Diagnosis Date  . Abnormality of gait 10/20/2014  . Arthritis    "was in my hips; still in my knees" (01/13/2018)  . DJD (degenerative joint disease), ankle and foot 10/20/2014  . Dyslipidemia 01/14/2018  . HAMMER TOE, ACQUIRED 07/28/2007   Qualifier: Diagnosis of  By:  Lorelei Pont MD, Frederico Hamman    . Hamstring tightness of left lower extremity 03/15/2015   Persistent left hamstring tightness and pain for the last month. Her right mild scoliosis is likely contributing to her tightness. - Provided hamstring compression wrap. - Heel lifts placed on insoles to reduce hamstring stretch. Provided with thinner set as well. - Limit explosive activities such as competitive tennis for at least one week and slowly ease back into it.  - Strengthening and stretc  . Heart murmur   . Leg cramps   . Metatarsalgia of left foot 10/20/2014  . Mild coronary artery disease 01/14/2018   LAD with 50% stenosis on [facility] 12/2017  . Nonrheumatic aortic valve stenosis   . OA (osteoarthritis) of hip 03/24/2013  . Osteoarthritis of knee    Spurring and effusion noted on [place] on Left   . PLANTAR FASCIITIS 07/28/2007   Qualifier: Diagnosis of  By: Lorelei Pont MD, Frederico Hamman    . Unstable angina ([place]) 01/13/2018  . Varicose veins of bilateral lower extremities with other complications 0/0/7622    Past Surgical History:  Procedure Laterality Date  . ABDOMINAL HYSTERECTOMY  03/03/2012   Procedure: HYSTERECTOMY ABDOMINAL;  Surgeon: Alvino Chapel, MD;  Location: [facility];  Service: Gynecology;  Laterality: N/A;  . BLEPHAROPLASTY Bilateral   . JOINT REPLACEMENT    . 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 RIGHT/LEFT HEART CATH AND CORONARY ANGIOGRAPHY N/A 01/14/2018   Procedure: RIGHT/LEFT HEART CATH AND CORONARY ANGIOGRAPHY;  Surgeon: Wellington Hampshire, MD;  Location: [place] [facility];  Service: Cardiovascular;  Laterality: N/A;  . SALPINGOOPHORECTOMY  03/03/2012   Procedure: SALPINGO OOPHERECTOMY;  Surgeon: Alvino Chapel, MD;  Location: [facility];  Service: Gynecology;  Laterality: Bilateral;  . TONSILLECTOMY    . TOTAL HIP ARTHROPLASTY Left  03/24/2013   Procedure: LEFT TOTAL HIP ARTHROPLASTY ANTERIOR APPROACH;  Surgeon: [name], MD;  Location: [facility];  Service: Orthopedics;  Laterality: Left;  . TOTAL HIP ARTHROPLASTY Right 12/18/2016   Procedure: RIGHT TOTAL HIP ARTHROPLASTY ANTERIOR APPROACH;  Surgeon: Gaynelle Arabian, MD;  Location: [facility];  Service: Orthopedics;  Laterality: Right;  . TUBAL LIGATION       Current Outpatient Medications  Medication Sig Dispense Refill  . aspirin EC 81 MG tablet Take 81 mg by mouth daily.    [name] estradiol (CLIMARA - DOSED IN MG/24 HR) 0.025 mg/24hr patch PLACE 1 PATCH (0.025 MG TOTAL) ONTO THE SKIN ONCE A WEEK. 4 patch 12  . UNABLE TO FIND Hyland's Leg Cramps tablets: Take 1 tablet by mouth during the night as needed for leg cramps    . zolpidem (AMBIEN) 10 MG tablet Take 1 tablet (10 mg total) by mouth at bedtime as needed. For sleep 30 tablet 1  . etodolac (LODINE) 500 MG tablet Take 1 tablet (500 mg total) by mouth daily. 30 tablet 6  . furosemide (LASIX) 20 MG tablet Take 1 tablet (20 mg total) by mouth daily. 90 tablet 3  . potassium chloride SA (K-DUR,KLOR-CON) 20 MEQ tablet Take 1 tablet (20 mEq total) by mouth daily. 90 tablet 3   No current facility-administered medications for this visit.     Allergies:   Codeine    Social History:  The patient  reports that she has never smoked. She has never used smokeless tobacco. She reports current alcohol use of about 7.0 standard drinks of alcohol per week. She reports that she does not use drugs.   Family History:  The patient's family history includes Bone cancer in her father; Breast cancer in her maternal aunt; Diabetes in her maternal aunt; Hypertension in her mother; Lung cancer in her father; Ovarian cancer in her sister.    ROS:  Please see the history of present illness.   Otherwise, review of systems are positive for none.   All other systems are reviewed and negative.    PHYSICAL EXAM: VS:  BP (!) 140/92 (BP Location:  Left Arm, Patient Position: Sitting, Cuff Size: Normal)   Pulse 76   Ht 5' 7.5" (1.715 m)   Wt 152 lb 3.2 oz (69 kg)   BMI 23.49 kg/m  , BMI Body mass index is 23.49 kg/m. GEN: Well nourished, well developed, in no acute distress  HEENT: normal  Neck: no JVD, carotid bruits, or masses Cardiac: RRR; gr 2/6 systolic murmur RUSB, no rubs or gallops. Respiratory:  clear to auscultation bilaterally, normal work of breathing GI: soft, nontender, nondistended, + BS MS: no deformity or atrophy. 1+ LLE edema and left leg is larger than right. Skin: warm and dry, no rash Neuro:  Strength and sensation are intact Psych: euthymic mood, full affect   EKG:  EKG is ordered today. The ekg ordered today demonstrates NSR rate 76, RBBB, T wave  abnormality c/w inferolateral ischemia.    Recent Labs: 01/13/2018: BUN 18; Creatinine, Ser 0.80; Potassium 3.9; Sodium 142 09/22/2018: Hemoglobin 13.4; Platelets 105.0 Repeated and verified X2.    Lipid Panel    Component Value Date/Time   CHOL 193 01/14/2018 0212   TRIG 55 01/14/2018 0212   HDL 85 01/14/2018 0212   CHOLHDL 2.3 01/14/2018 0212   VLDL 11 01/14/2018 0212   LDLCALC 97 01/14/2018 0212      Wt Readings from Last 3 Encounters:  10/05/18 152 lb 3.2 oz (69 kg)  09/22/18 150 lb (68 kg)  08/04/18 150 lb 12.8 oz (68.4 kg)      Other studies Reviewed: Additional studies/ records that were reviewed today include:  Echo 01/14/18: Study Conclusions  - Left ventricle: The cavity size was normal. Systolic function was   normal. The estimated ejection fraction was in the range of 50%   to 55%. Wall motion was normal; there were no regional wall   motion abnormalities. The study is not technically sufficient to   allow evaluation of LV diastolic function. - Aortic valve: Severe diffuse calcification involving the right   coronary and noncoronary cusp. There was moderate stenosis. Mean   gradient (S): 21 mm Hg. Valve area (VTI): 0.75 cm^2.  Valve area   (Vmax): 0.84 cm^2. Valve area (Vmean): 0.8 cm^2. - Mitral valve: Mild diffuse calcification of the anterior leaflet.   There was mild regurgitation. - Left atrium: The atrium was severely dilated. - Right ventricle: The cavity size was mildly dilated. Wall   thickness was normal. - Pulmonary arteries: PA peak pressure: 36 mm Hg (S). - Inferior vena cava: The vessel was mildly dilated.  Impressions:  - The right ventricular systolic pressure was increased consistent   with mild pulmonary hypertension.  Cardiac cath 01/14/18:  RIGHT/LEFT HEART CATH AND CORONARY ANGIOGRAPHY  Conclusion     Dist LAD lesion is 50% stenosed.   1.  Near normal coronary arteries with only moderate stenosis in the apical LAD. 2.  Right heart catheterization showed high normal filling pressures, normal pulmonary pressure and normal cardiac output. 3.  Mild aortic stenosis with a mean gradient of 7 mmHg and valve area of 2.7 cm.  Recommendations: No clear explanation for the patient's anginal symptoms based on this.  I did review her echocardiogram and EF appears to be around 50%.  She has moderate aortic stenosis by echo but seems to be mild by cath today.  She does have moderate mitral regurgitation as well. Recommend continuing medical therapy.   Indications   Unstable angina (HCC) [I20.0 (ICD-10-CM)]  Nonrheumatic aortic valve stenosis [I35.0 (ICD-10-CM)]  Procedural Details   Technical Details Procedural Details: The pre-existing IV in the right antecubital vein was exchanged under sterile fashion to a slender sheath. Right heart catheterization was performed using a 5 French Swan-Ganz catheter. Cardiac output was calculated by the Fick method. The right wrist was prepped, draped, and anesthetized with 1% lidocaine. Using the modified Seldinger technique, a 5 French sheath was introduced into the right radial artery. 3 mg of verapamil was administered through the sheath, weight-based  unfractionated heparin was administered intravenously. A Jackie catheter was used for selective coronary angiography. The aortic valve was noted to be moderately calcified and could not be crossed with a Jacky catheter over a pigtail catheter. Thus, I used a straight wire over an AL-1 catheter and was able to cross and record LV pressure with pullback. Left ventricular angiography was not performed. Catheter  exchanges were performed over an exchange length guidewire. There were no immediate procedural complications. A TR band was used for radial hemostasis at the completion of the procedure. The patient was transferred to the post catheterization recovery area for further monitoring.   Estimated blood loss <50 mL.  During this procedure the patient was administered the following to achieve and maintain moderate conscious sedation: Versed 1 mg, Fentanyl 25 mcg, while the patient's heart rate, blood pressure, and oxygen saturation were continuously monitored. The period of conscious sedation was 38 minutes, of which I was present face-to-face 100% of this time.  Medications  (Filter: Administrations occurring from 01/14/18 1101 to 01/14/18 1203)  Medication Rate/Dose/Volume Action  Date Time   midazolam (VERSED) injection (mg) 1 mg Given 01/14/18 1116   Total dose as of 10/05/18 0945        1 mg        fentaNYL (SUBLIMAZE) injection (mcg) 25 mcg Given 01/14/18 1116   Total dose as of 10/05/18 0945        25 mcg        heparin infusion 2 units/mL in 0.9 % sodium chloride (mL) 500 mL New Bag/Given 01/14/18 1116   Total dose as of 10/05/18 0945 500 mL New Bag/Given 1116   Cannot be calculated        lidocaine (PF) (XYLOCAINE) 1 % injection (mL) 2 mL Given 01/14/18 1121   Total dose as of 10/05/18 0945 2 mL Given 1129   4 mL        Radial Cocktail/Verapamil only (mL) 10 mL Given 01/14/18 1132   Total dose as of 10/05/18 0945        10 mL        heparin injection (Units) 3,500 Units Given 01/14/18  1135   Total dose as of 10/05/18 0945        3,500 Units        iohexol (OMNIPAQUE) 350 MG/ML injection (mL) 70 mL Given 01/14/18 1155   Total dose as of 10/05/18 0945        70 mL        0.9 % sodium chloride infusion (mL) *Not included in total MAR Hold 01/14/18 1101   Dosing weight:  69.9 kg        Total dose as of 10/05/18 0945        Cannot be calculated        acetaminophen (TYLENOL) tablet 650 mg (mg) *Not included in total MAR Hold 01/14/18 1101   Dosing weight:  69.9 kg        Total dose as of 10/05/18 0945        Cannot be calculated        aspirin EC tablet 81 mg (mg) *Not included in total MAR Hold 01/14/18 1101   Dosing weight:  69.9 kg        Total dose as of 10/05/18 0945        Cannot be calculated        atorvastatin (LIPITOR) tablet 40 mg (mg) *Not included in total MAR Hold 01/14/18 1101   Dosing weight:  69.9 kg        Total dose as of 10/05/18 0945        Cannot be calculated        etodolac (LODINE) capsule 500 mg (mg) *Not included in total MAR Hold 01/14/18 1101   Dosing weight:  68.4 kg        Total dose  as of 10/05/18 0945        Cannot be calculated        heparin injection 5,000 Units (Units) *Not included in total MAR Hold 01/14/18 1101   Dosing weight:  69.9 kg        Total dose as of 10/05/18 0945        Cannot be calculated        hydrALAZINE (APRESOLINE) injection 10 mg (mg) *Not included in total MAR Hold 01/14/18 1101   Dosing weight:  69.9 kg        Total dose as of 10/05/18 0945        Cannot be calculated        nitroGLYCERIN (NITROGLYN) 2 % ointment 1 inch (inch) *Not included in total MAR Hold 01/14/18 1101   Dosing weight:  69.9 kg *Not included in total Automatically Held 1200   Total dose as of 10/05/18 0945        Cannot be calculated        nitroGLYCERIN (NITROSTAT) SL tablet 0.4 mg (mg) *Not included in total MAR Hold 01/14/18 1101   Dosing weight:  69.9 kg        Total dose as of 10/05/18 0945        Cannot be calculated          ondansetron (ZOFRAN) injection 4 mg (mg) *Not included in total MAR Hold 01/14/18 1101   Dosing weight:  69.9 kg        Total dose as of 10/05/18 0945        Cannot be calculated        sodium chloride flush (NS) 0.9 % injection 3 mL (mL) *Not included in total MAR Hold 01/14/18 1101   Dosing weight:  69.9 kg        Total dose as of 10/05/18 0945        Cannot be calculated        sodium chloride flush (NS) 0.9 % injection 3 mL (mL) *Not included in total MAR Hold 01/14/18 1101   Dosing weight:  69.9 kg        Total dose as of 10/05/18 0945        Cannot be calculated        zolpidem (AMBIEN) tablet 5 mg (mg) *Not included in total MAR Hold 01/14/18 1101   Total dose as of 10/05/18 0945        Cannot be calculated        Sedation Time   Sedation Time Physician-1: 37 minutes 26 seconds  Coronary Findings   Diagnostic  Dominance: Right  Left Main  Vessel is angiographically normal.  Left Anterior Descending  Dist LAD lesion 50% stenosed  Dist LAD lesion is 50% stenosed.  First Diagonal Branch  Vessel is angiographically normal.  Second Diagonal Branch  Vessel is angiographically normal.  Third Diagonal Branch  Vessel is angiographically normal.  Left Circumflex  Second Obtuse Marginal Branch  Vessel is angiographically normal.  Right Coronary Artery  Vessel is angiographically normal.  Right Posterior Descending Artery  Vessel is angiographically normal.  Right Posterior Atrioventricular Branch  Vessel is angiographically normal.  First Right Posterolateral  Vessel is angiographically normal.  Second Right Posterolateral  Vessel is angiographically normal.  Intervention   No interventions have been documented.  Coronary Diagrams   Diagnostic  Dominance: Right    Intervention   Implants    No implant documentation for this case.  Syngo Images  Show images for CARDIAC CATHETERIZATION  MERGE Images   Show images for CARDIAC CATHETERIZATION   Link to  Procedure Log   Procedure Log    Hemo Data    Most Recent Value  Fick Cardiac Output 5.01 L/min  Fick Cardiac Output Index 2.78 (L/min)/BSA  Aortic Mean Gradient 7.1 mmHg  Aortic Peak Gradient 5 mmHg  Aortic Valve Area 2.70  Aortic Value Area Index 1.5 cm2/BSA  RA A Wave 6 mmHg  RA V Wave 4 mmHg  RA Mean 3 mmHg  RV Systolic Pressure 32 mmHg  RV Diastolic Pressure 1 mmHg  RV EDP 5 mmHg  PA Systolic Pressure 32 mmHg  PA Diastolic Pressure 11 mmHg  PA Mean 20 mmHg  PW A Wave 13 mmHg  PW V Wave 18 mmHg  PW Mean 13 mmHg  AO Systolic Pressure 154 mmHg  AO Diastolic Pressure 61 mmHg  AO Mean 85 mmHg  LV Systolic Pressure 008 mmHg  LV Diastolic Pressure 7 mmHg  LV EDP 17 mmHg  AOp Systolic Pressure 676 mmHg  AOp Diastolic Pressure 66 mmHg  AOp Mean Pressure 90 mmHg  LVp Systolic Pressure 195 mmHg  LVp Diastolic Pressure 7 mmHg  LVp EDP Pressure 18 mmHg  QP/QS 1  TPVR Index 7.17 HRUI  TSVR Index 30.51 HRUI  PVR SVR Ratio 0.09  TPVR/TSVR Ratio 0.24   PORTABLE CHEST 1 VIEW  COMPARISON:  02/28/2012  FINDINGS: Hyperinflation. Cardiomegaly. Mild diffuse interstitial opacity. No pleural effusion. No pneumothorax. Scoliosis of the spine.  IMPRESSION: 1. Mild cardiomegaly 2. Mild diffuse increased interstitial opacity bilaterally may reflect minimal edema or interstitial inflammation, suspect that there may be component of underlying chronic interstitial change.   Electronically Signed   By: Donavan Foil M.D.   On: 01/13/2018 20:34  CLINICAL DATA:  Dyspnea with exertion for several months. Interstitial prominence on prior chest radiograph.  EXAM: CT CHEST WITHOUT CONTRAST  TECHNIQUE: Multidetector CT imaging of the chest was performed following the standard protocol without intravenous contrast. High resolution imaging of the lungs, as well as inspiratory and expiratory imaging, was performed.  COMPARISON:  01/13/2018 chest  radiograph.  FINDINGS: Cardiovascular: Mild cardiomegaly. No significant pericardial effusion/thickening. Left anterior descending coronary atherosclerosis. Atherosclerotic nonaneurysmal thoracic aorta. Top-normal caliber main pulmonary artery (3.2 cm diameter).  Mediastinum/Nodes: No discrete thyroid nodules. Unremarkable esophagus. No pathologically enlarged axillary, mediastinal or hilar lymph nodes, noting limited sensitivity for the detection of hilar adenopathy on this noncontrast study.  Lungs/Pleura: No pneumothorax. No pleural effusion. A few scattered small solid pulmonary nodules in both lungs, largest 4 mm in the anterior left lower lobe (series 5/image 119). No acute consolidative airspace disease or lung masses. Moderate patchy air trapping in both lungs on the expiration sequence. There is interlobular septal thickening throughout both lungs, most prominent in the lower lungs. A few scattered small parenchymal bands are present in the mid to lower lungs bilaterally. No significant regions of subpleural reticulation, ground-glass attenuation, traction bronchiectasis, architectural distortion or frank honeycombing.  Upper abdomen: No acute abnormality.  Musculoskeletal: No aggressive appearing focal osseous lesions. Moderate thoracic spondylosis.  IMPRESSION: 1. Moderate air trapping throughout both lungs, indicative of small airways disease. 2. Cardiomegaly. Interlobular septal thickening throughout both lungs, most prominent in the lower lungs, most compatible with mild pulmonary edema. 3. Scattered parenchymal bands in the mid to lower lungs bilaterally, compatible with nonspecific postinfectious/postinflammatory scarring. No compelling findings of interstitial lung disease at this time. 4. Scattered small solid pulmonary nodules, largest  4 mm. No follow-up needed if patient is low-risk (and has no known or suspected primary neoplasm). Non-contrast  chest CT can be considered in 12 months if patient is high-risk. This recommendation follows the consensus statement: Guidelines for Management of Incidental Pulmonary Nodules Detected on CT Images:From the Fleischner Society 2017; published online before print (10.1148/radiol.5427062376). 5. One vessel coronary atherosclerosis.  Aortic Atherosclerosis (ICD10-I70.0).   Electronically Signed   By: Ilona Sorrel M.D.   On: 08/07/2018 11:14  PFTs   Although the FEV1 and FVC are reduced, the FEV1/FVC ratio is increased. The airway resistance is normal. The lung volumes are reduced. Following administration of bronchodilators, there is no significant response. The reduced diffusing capacity indicates a moderate loss of functional alveolar capillary surface. However, the diffusing capacity was not corrected for the patient's hemoglobin.  Pulmonary Function Diagnosis: Moderate Restriction Moderate Diffusion Defect ____________________________________________   ASSESSMENT AND PLAN:  1.  Dyspnea on exertion. Patient has had extensive evaluation this year from a cardiac and pulmonary standpoint but it is still unclear what the etiology of her dyspnea is. Her CT suggests a component of pulmonary edema but prior cardiac cath showed fairly normal filling pressures, right heart pressures and cardiac output. Her aortic stenosis does not appear severe enough to cause these symptoms. I personally reviewed her angiograms and while the distal LAD is diffusely narrowed it it not severe. She does have moderate restriction and diffusion abnormalities on PFTs. Unresponsive to bronchodilators.  Other consideration would be pulmonary embolic disease.  I have recommended a clinical trial of diuretics. Will add lasix 20 mg daily and potassium 20 meq daily.  Will repeat Echo. Will schedule CT angiogram to rule out embolic disease. Will need to update BMET I will follow up in 4 weeks to review studies and  response to diuretics. If no improvement may want to consider CPX.  2. CAD with distal LAD disease.  3. Mild to moderate aortic stenosis.  4. Osteoarthritis. I have ok'd her to resume NSAID which she takes once a day.   Current medicines are reviewed at length with the patient today.  The patient does not have concerns regarding medicines.  The following changes have been made:  See above  Labs/ tests ordered today include:   Orders Placed This Encounter  Procedures  . CT ANGIO CHEST PE W OR WO CONTRAST  . Basic metabolic panel  . TSH  . Basic metabolic panel  . TSH  . EKG 12-Lead  . ECHOCARDIOGRAM COMPLETE     Disposition:   FU with me in 4 weeks  Signed, Carliss Porcaro [name], MD  10/05/2018 9:48 AM    [facility] [address] [place], [place], [address] Phone [phone], Fax [phone]

## 2018-10-05 ENCOUNTER — Encounter: Payer: Self-pay | Admitting: Cardiology

## 2018-10-05 ENCOUNTER — Other Ambulatory Visit: Payer: PPO | Admitting: *Deleted

## 2018-10-05 ENCOUNTER — Other Ambulatory Visit: Payer: Self-pay

## 2018-10-05 ENCOUNTER — Ambulatory Visit: Payer: PPO | Admitting: Cardiology

## 2018-10-05 ENCOUNTER — Other Ambulatory Visit: Payer: Self-pay | Admitting: Cardiology

## 2018-10-05 ENCOUNTER — Ambulatory Visit (INDEPENDENT_AMBULATORY_CARE_PROVIDER_SITE_OTHER)
Admission: RE | Admit: 2018-10-05 | Discharge: 2018-10-05 | Disposition: A | Discharge status: Home / Self Care | Payer: PPO | Source: Ambulatory Visit | Attending: Cardiology | Admitting: Cardiology

## 2018-10-05 VITALS — BP 140/92 | HR 76 | Ht 67.5 in | Wt 152.2 lb

## 2018-10-05 DIAGNOSIS — I350 Nonrheumatic aortic (valve) stenosis: Secondary | ICD-10-CM

## 2018-10-05 DIAGNOSIS — R0602 Shortness of breath: Secondary | ICD-10-CM | POA: Diagnosis not present

## 2018-10-05 DIAGNOSIS — I2510 Atherosclerotic heart disease of native coronary artery without angina pectoris: Secondary | ICD-10-CM | POA: Diagnosis not present

## 2018-10-05 DIAGNOSIS — R0600 Dyspnea, unspecified: Secondary | ICD-10-CM

## 2018-10-05 DIAGNOSIS — R0609 Other forms of dyspnea: Secondary | ICD-10-CM

## 2018-10-05 LAB — BASIC METABOLIC PANEL
BUN / CREAT RATIO: 21 (ref 12–28)
BUN: 21 mg/dL (ref 8–27)
CHLORIDE: 104 mmol/L (ref 96–106)
CO2: 25 mmol/L (ref 20–29)
CREATININE: 0.98 mg/dL (ref 0.57–1.00)
Calcium: 9.2 mg/dL (ref 8.7–10.3)
GFR calc Af Amer: 66 mL/min/{1.73_m2} (ref >59)
GFR calc non Af Amer: 57 mL/min/{1.73_m2} — ABNORMAL LOW (ref >59)
GLUCOSE: 107 mg/dL — AB (ref 65–99)
POTASSIUM: 4.3 mmol/L (ref 3.5–5.2)
SODIUM: 138 mmol/L (ref 134–144)

## 2018-10-05 LAB — TSH: TSH: 2.81 u[IU]/mL (ref 0.450–4.500)

## 2018-10-05 MED ORDER — FUROSEMIDE 20 MG PO TABS: 20.0000 mg | ORAL_TABLET | Freq: Every day | ORAL | 3 refills | Status: DC

## 2018-10-05 MED ORDER — IOPAMIDOL (ISOVUE-370) INJECTION 76%
80.0000 mL | Freq: Once | INTRAVENOUS | Status: AC | PRN
  Administered 2018-10-05: 80 mL via INTRAVENOUS

## 2018-10-05 MED ORDER — POTASSIUM CHLORIDE CRYS ER 20 MEQ PO TBCR: 20.0000 meq | EXTENDED_RELEASE_TABLET | Freq: Every day | ORAL | 3 refills | Status: DC

## 2018-10-05 MED ORDER — ETODOLAC 500 MG PO TABS: 500.0000 mg | ORAL_TABLET | Freq: Every day | ORAL | 6 refills | Status: DC

## 2018-10-05 NOTE — Patient Instructions (Signed)
Take lasix 20 mg daily  Take potassium 20 meq daily  We will check and Echocardiogram and a CT angiogram of the chest  I will see you in 4 weeks.

## 2018-10-07 ENCOUNTER — Ambulatory Visit (HOSPITAL_COMMUNITY): Payer: PPO | Attending: Cardiovascular Disease

## 2018-10-07 DIAGNOSIS — R0609 Other forms of dyspnea: Secondary | ICD-10-CM | POA: Insufficient documentation

## 2018-10-07 DIAGNOSIS — I350 Nonrheumatic aortic (valve) stenosis: Secondary | ICD-10-CM | POA: Diagnosis not present

## 2018-10-07 DIAGNOSIS — I2510 Atherosclerotic heart disease of native coronary artery without angina pectoris: Secondary | ICD-10-CM | POA: Insufficient documentation

## 2018-10-07 DIAGNOSIS — R0600 Dyspnea, unspecified: Secondary | ICD-10-CM

## 2018-10-09 ENCOUNTER — Telehealth: Payer: Self-pay | Admitting: Cardiology

## 2018-10-09 MED ORDER — SACUBITRIL-VALSARTAN 24-26 MG PO TABS: 1.0000 | ORAL_TABLET | Freq: Two times a day (BID) | ORAL | 6 refills | Status: DC

## 2018-10-09 MED ORDER — FUROSEMIDE 20 MG PO TABS: ORAL_TABLET | ORAL | 3 refills | Status: DC

## 2018-10-09 NOTE — Telephone Encounter (Signed)
Returned call to patient echo results given.Dr.Jordan advised to increase Lasix to 40 mg daily.Increase Kdur 20 meq 2 tablets daily.Advised to start Entresto 24/26 mg twice a day.Stated she has been having legs cramps at night.She will take OTC leg cramp medication.Appointment scheduled with Dr.Jordan 2/13 at 10:00 am to discuss echo results.

## 2018-10-09 NOTE — Telephone Encounter (Signed)
New Message   Patient is returning call in reference to her labs and changes in medication. Please call to discuss.

## 2018-10-12 ENCOUNTER — Telehealth: Payer: Self-pay | Admitting: Cardiology

## 2018-10-12 DIAGNOSIS — Z79899 Other long term (current) drug therapy: Secondary | ICD-10-CM | POA: Diagnosis not present

## 2018-10-12 DIAGNOSIS — R7989 Other specified abnormal findings of blood chemistry: Secondary | ICD-10-CM | POA: Diagnosis not present

## 2018-10-12 DIAGNOSIS — E7800 Pure hypercholesterolemia, unspecified: Secondary | ICD-10-CM | POA: Diagnosis not present

## 2018-10-12 NOTE — H&P (View-Only) (Signed)
Cardiology Office Note   Date:  10/15/2018   ID:  Brittany, Shaffer 03-06-1946, MRN [id number]  PCP:  Brittany Cruel, Shaffer  Cardiologist:   Brittany Cordell [name], Shaffer   No chief complaint on file.     History of Present Illness: Brittany Shaffer is a 73 y.o. female who is seen for follow up CHF. She has a history of RBBB and murmur. She was evaluated by our service in May 2019 when she presented with dyspnea and chest pain. Troponins were negative. Echo showed moderate calcific aortic stenosis. MAC, EF 50-55% and mild pulmonary HTN. She underwent R/LHC which revealed near normal coronary arteries with only moderate stenosis (50%) in the apical LAD, with high normal filling pressures, normal pulmonary pressure, normal cardiac output, mild AS, and moderate MR. AV gradient was 7 mm at cath. She was recommended to continue medial therapy.   She was seen 2 weeks ago for evaluation of  dyspnea that started in May 2019. It has persisted since then. She was tried on inhalers without improvement. She notes DOE such as playing tennis, walking up an incline, taking her trash out, or even walking across her home. She walks daily and notes she now needs to stop 3-4 times on her walk. She has chronic swelling in her left leg> right. She had a bad cold in December with cough but this has resolved. She feels worn out. At night she sometimes has to prop up to breath and at times sleeps in a recliner.   She was evaluated by Pulmonary- Dr Wonda Amis. CT chest showed no ILD but was consistent with mild edema. PFTs showed  moderate restriction and moderate diffusion abnormality.   After our last visit we obtained lab work with a normal BMET and TSH. CT chest with contrast showed no evidence of PE but there were bilateral pleural effusions and evidence of interstitial pulmonary edema. Repeat Echo showed marked decrease in LV systolic function with EF 25-30% and restrictive filling parameters. Severe pulmonary HTN. Severe AS (low  flow) with mean gradient of 25 mm Hg but AV are 0.69 cm squared. Based on these findings she was started on lasix 40 mg daily with potassium and Entresto 24/26 mg bid. After starting the [facility] she developed hypotension and dose was reduced by half.   On follow up today she notes her breathing has improved and her lower extremity edema has resolved. BP is better on lower dose of Entresto. She still gets SOB going up hills. Has to stop and rest. Feels weak. Weight is down 9 lbs.     Past Medical History:  Diagnosis Date  . Abnormality of gait 10/20/2014  . Arthritis    "was in my hips; still in my knees" (01/13/2018)  . DJD (degenerative joint disease), ankle and foot 10/20/2014  . Dyslipidemia 01/14/2018  . HAMMER TOE, ACQUIRED 07/28/2007   Qualifier: Diagnosis of  By: Brittany Shaffer, Brittany Shaffer    . Hamstring tightness of left lower extremity 03/15/2015   Persistent left hamstring tightness and pain for the last month. Her right mild scoliosis is likely contributing to her tightness. - Provided hamstring compression wrap. - Heel lifts placed on insoles to reduce hamstring stretch. Provided with thinner set as well. - Limit explosive activities such as competitive tennis for at least one week and slowly ease back into it.  - Strengthening and stretc  . Heart murmur   . Leg cramps   . Metatarsalgia of left foot 10/20/2014  .  Mild coronary artery disease 01/14/2018   LAD with 50% stenosis on [facility] 12/2017  . Nonrheumatic aortic valve stenosis   . OA (osteoarthritis) of hip 03/24/2013  . Osteoarthritis of knee    Spurring and effusion noted on [place] on Left   . PLANTAR FASCIITIS 07/28/2007   Qualifier: Diagnosis of  By: Brittany Shaffer, Brittany Shaffer    . Unstable angina ([place]) 01/13/2018  . Varicose veins of bilateral lower extremities with other complications 11/05/6292    Past Surgical History:  Procedure Laterality Date  . ABDOMINAL HYSTERECTOMY  03/03/2012   Procedure: HYSTERECTOMY ABDOMINAL;  Surgeon: Brittany Chapel, Shaffer;  Location: [facility];  Service: Gynecology;  Laterality: N/A;  . BLEPHAROPLASTY Bilateral   . JOINT REPLACEMENT    . 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 RIGHT/LEFT HEART CATH AND CORONARY ANGIOGRAPHY N/A 01/14/2018   Procedure: RIGHT/LEFT HEART CATH AND CORONARY ANGIOGRAPHY;  Surgeon: Brittany Hampshire, Shaffer;  Location: [facility];  Service: Cardiovascular;  Laterality: N/A;  . SALPINGOOPHORECTOMY  03/03/2012   Procedure: SALPINGO OOPHERECTOMY;  Surgeon: Brittany Chapel, Shaffer;  Location: [facility];  Service: Gynecology;  Laterality: Bilateral;  . TONSILLECTOMY    . TOTAL HIP ARTHROPLASTY Left 03/24/2013   Procedure: LEFT TOTAL HIP ARTHROPLASTY ANTERIOR APPROACH;  Surgeon: Brittany Alf, Shaffer;  Location: [facility];  Service: Orthopedics;  Laterality: Left;  . TOTAL HIP ARTHROPLASTY Right 12/18/2016   Procedure: RIGHT TOTAL HIP ARTHROPLASTY ANTERIOR APPROACH;  Surgeon: Brittany Arabian, Shaffer;  Location: [facility];  Service: Orthopedics;  Laterality: Right;  . TUBAL LIGATION       Current Outpatient Medications  Medication Sig Dispense Refill  . aspirin EC 81 MG tablet Take 81 mg by mouth daily.    [name] estradiol (CLIMARA - DOSED IN MG/24 HR) 0.025 mg/24hr patch PLACE 1 PATCH (0.025 MG TOTAL) ONTO THE SKIN ONCE A WEEK. 4 patch 12  . etodolac (LODINE) 500 MG tablet Take 1 tablet (500 mg total) by mouth daily. 30 tablet 6  . furosemide (LASIX) 20 MG tablet Take 2 tablets ( 40 mg ) daily 90 tablet 3  . potassium chloride SA (K-DUR,KLOR-CON) 20 MEQ tablet Take 2 tablets ( 40 meq ) daily 90 tablet 3  . sacubitril-valsartan (ENTRESTO) 24-26 MG Take 1/2 tablet twice a day 60 tablet   . UNABLE TO FIND Hyland's Leg Cramps tablets: Take 1 tablet by mouth during the night as needed for leg cramps    . zolpidem (AMBIEN) 10 MG tablet Take 1  tablet (10 mg total) by mouth at bedtime as needed. For sleep 30 tablet 1   No current facility-administered medications for this visit.     Allergies:   Codeine    Social History:  The patient  reports that she has never smoked. She has never used smokeless tobacco. She reports current alcohol use of about 7.0 standard drinks of alcohol per week. She reports that she does not use drugs.   Family History:  The patient's family history includes Bone cancer in her father; Breast cancer in her maternal aunt; Diabetes in her maternal aunt; Hypertension in her mother; Lung cancer in her father; Ovarian cancer in her sister.    ROS:  Please see the history of present illness.   Otherwise, review of systems are positive for none.   All other systems are reviewed  and negative.    PHYSICAL EXAM: VS:  BP 108/76   Pulse 74   Ht 5' 7.5" (1.715 m)   Wt 143 lb (64.9 kg)   BMI 22.07 kg/m  , BMI Body mass index is 22.07 kg/m. GEN: Well nourished, well developed, in no acute distress  HEENT: normal  Neck: no JVD, carotid bruits, or masses Cardiac: RRR; gr 2/6 systolic murmur RUSB, S2 reduced. No rubs or gallops. Respiratory:  clear to auscultation bilaterally, normal work of breathing GI: soft, nontender, nondistended, + BS MS: no deformity or atrophy. No edema and left leg is larger than right. Skin: warm and dry, no rash Neuro:  Strength and sensation are intact Psych: euthymic mood, full affect   EKG:  EKG is not ordered today. The ekg ordered 10/05/18 demonstrates NSR rate 76, RBBB, T wave abnormality c/w inferolateral ischemia.    Recent Labs: 09/22/2018: Hemoglobin 13.4; Platelets 105.0 Repeated and verified X2. 10/05/2018: BUN 21; Creatinine, Ser 0.98; Potassium 4.3; Sodium 138; TSH 2.810    Lipid Panel    Component Value Date/Time   CHOL 193 01/14/2018 0212   TRIG 55 01/14/2018 0212   HDL 85 01/14/2018 0212   CHOLHDL 2.3 01/14/2018 0212   VLDL 11 01/14/2018 0212   LDLCALC 97  01/14/2018 0212    Labs dated 10/12/18: cholesterol 231, triglycerides 86, HDL 83, LDL 131. Creatinine 1.12, potassium 5.8. ALT and CBC normal.   Wt Readings from Last 3 Encounters:  10/15/18 143 lb (64.9 kg)  10/05/18 152 lb 3.2 oz (69 kg)  09/22/18 150 lb (68 kg)      Other studies Reviewed: Additional studies/ records that were reviewed today include:  Echo 01/14/18: Study Conclusions  - Left ventricle: The cavity size was normal. Systolic function was   normal. The estimated ejection fraction was in the range of 50%   to 55%. Wall motion was normal; there were no regional wall   motion abnormalities. The study is not technically sufficient to   allow evaluation of LV diastolic function. - Aortic valve: Severe diffuse calcification involving the right   coronary and noncoronary cusp. There was moderate stenosis. Mean   gradient (S): 21 mm Hg. Valve area (VTI): 0.75 cm^2. Valve area   (Vmax): 0.84 cm^2. Valve area (Vmean): 0.8 cm^2. - Mitral valve: Mild diffuse calcification of the anterior leaflet.   There was mild regurgitation. - Left atrium: The atrium was severely dilated. - Right ventricle: The cavity size was mildly dilated. Wall   thickness was normal. - Pulmonary arteries: PA peak pressure: 36 mm Hg (S). - Inferior vena cava: The vessel was mildly dilated.  Impressions:  - The right ventricular systolic pressure was increased consistent   with mild pulmonary hypertension.  Cardiac cath 01/14/18:  RIGHT/LEFT HEART CATH AND CORONARY ANGIOGRAPHY  Conclusion     Dist LAD lesion is 50% stenosed.   1.  Near normal coronary arteries with only moderate stenosis in the apical LAD. 2.  Right heart catheterization showed high normal filling pressures, normal pulmonary pressure and normal cardiac output. 3.  Mild aortic stenosis with a mean gradient of 7 mmHg and valve area of 2.7 cm.  Recommendations: No clear explanation for the patient's anginal symptoms based  on this.  I did review her echocardiogram and EF appears to be around 50%.  She has moderate aortic stenosis by echo but seems to be mild by cath today.  She does have moderate mitral regurgitation as well. Recommend continuing medical therapy.  Indications   Unstable angina (HCC) [I20.0 (ICD-10-CM)]  Nonrheumatic aortic valve stenosis [I35.0 (ICD-10-CM)]  Procedural Details   Technical Details Procedural Details: The pre-existing IV in the right antecubital vein was exchanged under sterile fashion to a slender sheath. Right heart catheterization was performed using a 5 French Swan-Ganz catheter. Cardiac output was calculated by the Fick method. The right wrist was prepped, draped, and anesthetized with 1% lidocaine. Using the modified Seldinger technique, a 5 French sheath was introduced into the right radial artery. 3 mg of verapamil was administered through the sheath, weight-based unfractionated heparin was administered intravenously. A Jackie catheter was used for selective coronary angiography. The aortic valve was noted to be moderately calcified and could not be crossed with a Jacky catheter over a pigtail catheter. Thus, I used a straight wire over an AL-1 catheter and was able to cross and record LV pressure with pullback. Left ventricular angiography was not performed. Catheter exchanges were performed over an exchange length guidewire. There were no immediate procedural complications. A TR band was used for radial hemostasis at the completion of the procedure. The patient was transferred to the post catheterization recovery area for further monitoring.   Estimated blood loss <50 mL.  During this procedure the patient was administered the following to achieve and maintain moderate conscious sedation: Versed 1 mg, Fentanyl 25 mcg, while the patient's heart rate, blood pressure, and oxygen saturation were continuously monitored. The period of conscious sedation was 38 minutes, of which I  was present face-to-face 100% of this time.  Medications  (Filter: Administrations occurring from 01/14/18 1101 to 01/14/18 1203)  Medication Rate/Dose/Volume Action  Date Time   midazolam (VERSED) injection (mg) 1 mg Given 01/14/18 1116   Total dose as of 10/05/18 0945        1 mg        fentaNYL (SUBLIMAZE) injection (mcg) 25 mcg Given 01/14/18 1116   Total dose as of 10/05/18 0945        25 mcg        heparin infusion 2 units/mL in 0.9 % sodium chloride (mL) 500 mL New Bag/Given 01/14/18 1116   Total dose as of 10/05/18 0945 500 mL New Bag/Given 1116   Cannot be calculated        lidocaine (PF) (XYLOCAINE) 1 % injection (mL) 2 mL Given 01/14/18 1121   Total dose as of 10/05/18 0945 2 mL Given 1129   4 mL        Radial Cocktail/Verapamil only (mL) 10 mL Given 01/14/18 1132   Total dose as of 10/05/18 0945        10 mL        heparin injection (Units) 3,500 Units Given 01/14/18 1135   Total dose as of 10/05/18 0945        3,500 Units        iohexol (OMNIPAQUE) 350 MG/ML injection (mL) 70 mL Given 01/14/18 1155   Total dose as of 10/05/18 0945        70 mL        0.9 % sodium chloride infusion (mL) *Not included in total MAR Hold 01/14/18 1101   Dosing weight:  69.9 kg        Total dose as of 10/05/18 0945        Cannot be calculated        acetaminophen (TYLENOL) tablet 650 mg (mg) *Not included in total MAR Hold 01/14/18 1101   Dosing weight:  69.9 kg  Total dose as of 10/05/18 0945        Cannot be calculated        aspirin EC tablet 81 mg (mg) *Not included in total MAR Hold 01/14/18 1101   Dosing weight:  69.9 kg        Total dose as of 10/05/18 0945        Cannot be calculated        atorvastatin (LIPITOR) tablet 40 mg (mg) *Not included in total MAR Hold 01/14/18 1101   Dosing weight:  69.9 kg        Total dose as of 10/05/18 0945        Cannot be calculated        etodolac (LODINE) capsule 500 mg (mg) *Not included in total MAR Hold 01/14/18 1101   Dosing  weight:  68.4 kg        Total dose as of 10/05/18 0945        Cannot be calculated        heparin injection 5,000 Units (Units) *Not included in total MAR Hold 01/14/18 1101   Dosing weight:  69.9 kg        Total dose as of 10/05/18 0945        Cannot be calculated        hydrALAZINE (APRESOLINE) injection 10 mg (mg) *Not included in total MAR Hold 01/14/18 1101   Dosing weight:  69.9 kg        Total dose as of 10/05/18 0945        Cannot be calculated        nitroGLYCERIN (NITROGLYN) 2 % ointment 1 inch (inch) *Not included in total MAR Hold 01/14/18 1101   Dosing weight:  69.9 kg *Not included in total Automatically Held 1200   Total dose as of 10/05/18 0945        Cannot be calculated        nitroGLYCERIN (NITROSTAT) SL tablet 0.4 mg (mg) *Not included in total MAR Hold 01/14/18 1101   Dosing weight:  69.9 kg        Total dose as of 10/05/18 0945        Cannot be calculated        ondansetron (ZOFRAN) injection 4 mg (mg) *Not included in total MAR Hold 01/14/18 1101   Dosing weight:  69.9 kg        Total dose as of 10/05/18 0945        Cannot be calculated        sodium chloride flush (NS) 0.9 % injection 3 mL (mL) *Not included in total [facility] Hold 01/14/18 1101   Dosing weight:  69.9 kg        Total dose as of 10/05/18 0945        Cannot be calculated        sodium chloride flush (NS) 0.9 % injection 3 mL (mL) *Not included in total MAR Hold 01/14/18 1101   Dosing weight:  69.9 kg        Total dose as of 10/05/18 0945        Cannot be calculated        zolpidem (AMBIEN) tablet 5 mg (mg) *Not included in total MAR Hold 01/14/18 1101   Total dose as of 10/05/18 0945        Cannot be calculated        Sedation Time   Sedation Time Physician-1: 37 minutes 26 seconds  Coronary Findings  Diagnostic  Dominance: Right  Left Main  Vessel is angiographically normal.  Left Anterior Descending  Dist LAD lesion 50% stenosed  Dist LAD lesion is 50% stenosed.  First Diagonal Branch    Vessel is angiographically normal.  Second Diagonal Branch  Vessel is angiographically normal.  Third Diagonal Branch  Vessel is angiographically normal.  Left Circumflex  Second Obtuse Marginal Branch  Vessel is angiographically normal.  Right Coronary Artery  Vessel is angiographically normal.  Right Posterior Descending Artery  Vessel is angiographically normal.  Right Posterior Atrioventricular Branch  Vessel is angiographically normal.  First Right Posterolateral  Vessel is angiographically normal.  Second Right Posterolateral  Vessel is angiographically normal.  Intervention   No interventions have been documented.  Coronary Diagrams   Diagnostic  Dominance: Right    Intervention   Implants    No implant documentation for this case.  Syngo Images   Show images for CARDIAC CATHETERIZATION  MERGE Images   Show images for CARDIAC CATHETERIZATION   Link to Procedure Log   Procedure Log    Hemo Data    Most Recent Value  Fick Cardiac Output 5.01 L/min  Fick Cardiac Output Index 2.78 (L/min)/BSA  Aortic Mean Gradient 7.1 mmHg  Aortic Peak Gradient 5 mmHg  Aortic Valve Area 2.70  Aortic Value Area Index 1.5 cm2/BSA  RA A Wave 6 mmHg  RA V Wave 4 mmHg  RA Mean 3 mmHg  RV Systolic Pressure 32 mmHg  RV Diastolic Pressure 1 mmHg  RV EDP 5 mmHg  PA Systolic Pressure 32 mmHg  PA Diastolic Pressure 11 mmHg  PA Mean 20 mmHg  PW A Wave 13 mmHg  PW V Wave 18 mmHg  PW Mean 13 mmHg  AO Systolic Pressure 124 mmHg  AO Diastolic Pressure 61 mmHg  AO Mean 85 mmHg  LV Systolic Pressure 138 mmHg  LV Diastolic Pressure 7 mmHg  LV EDP 17 mmHg  AOp Systolic Pressure 130 mmHg  AOp Diastolic Pressure 66 mmHg  AOp Mean Pressure 90 mmHg  LVp Systolic Pressure 135 mmHg  LVp Diastolic Pressure 7 mmHg  LVp EDP Pressure 18 mmHg  QP/QS 1  TPVR Index 7.17 HRUI  TSVR Index 30.51 HRUI  PVR SVR Ratio 0.09  TPVR/TSVR Ratio 0.24   PORTABLE CHEST 1 VIEW  COMPARISON:   02/28/2012  FINDINGS: Hyperinflation. Cardiomegaly. Mild diffuse interstitial opacity. No pleural effusion. No pneumothorax. Scoliosis of the spine.  IMPRESSION: 1. Mild cardiomegaly 2. Mild diffuse increased interstitial opacity bilaterally may reflect minimal edema or interstitial inflammation, suspect that there may be component of underlying chronic interstitial change.   Electronically Signed   By: Kim  Fujinaga M.D.   On: 01/13/2018 20:34  CLINICAL DATA:  Dyspnea with exertion for several months. Interstitial prominence on prior chest radiograph.  EXAM: CT CHEST WITHOUT CONTRAST  TECHNIQUE: Multidetector CT imaging of the chest was performed following the standard protocol without intravenous contrast. High resolution imaging of the lungs, as well as inspiratory and expiratory imaging, was performed.  COMPARISON:  01/13/2018 chest radiograph.  FINDINGS: Cardiovascular: Mild cardiomegaly. No significant pericardial effusion/thickening. Left anterior descending coronary atherosclerosis. Atherosclerotic nonaneurysmal thoracic aorta. Top-normal caliber main pulmonary artery (3.2 cm diameter).  Mediastinum/Nodes: No discrete thyroid nodules. Unremarkable esophagus. No pathologically enlarged axillary, mediastinal or hilar lymph nodes, noting limited sensitivity for the detection of hilar adenopathy on this noncontrast study.  Lungs/Pleura: No pneumothorax. No pleural effusion. A few scattered small solid pulmonary nodules in both lungs, largest 4 mm   in the anterior left lower lobe (series 5/image 119). No acute consolidative airspace disease or lung masses. Moderate patchy air trapping in both lungs on the expiration sequence. There is interlobular septal thickening throughout both lungs, most prominent in the lower lungs. A few scattered small parenchymal bands are present in the mid to lower lungs bilaterally. No significant regions of subpleural  reticulation, ground-glass attenuation, traction bronchiectasis, architectural distortion or frank honeycombing.  Upper abdomen: No acute abnormality.  Musculoskeletal: No aggressive appearing focal osseous lesions. Moderate thoracic spondylosis.  IMPRESSION: 1. Moderate air trapping throughout both lungs, indicative of small airways disease. 2. Cardiomegaly. Interlobular septal thickening throughout both lungs, most prominent in the lower lungs, most compatible with mild pulmonary edema. 3. Scattered parenchymal bands in the mid to lower lungs bilaterally, compatible with nonspecific postinfectious/postinflammatory scarring. No compelling findings of interstitial lung disease at this time. 4. Scattered small solid pulmonary nodules, largest 4 mm. No follow-up needed if patient is low-risk (and has no known or suspected primary neoplasm). Non-contrast chest CT can be considered in 12 months if patient is high-risk. This recommendation follows the consensus statement: Guidelines for Management of Incidental Pulmonary Nodules Detected on CT Images:From the Fleischner Society 2017; published online before print (10.1148/radiol.5176160737). 5. One vessel coronary atherosclerosis.  Aortic Atherosclerosis (ICD10-I70.0).   Electronically Signed   By: Ilona Sorrel M.D.   On: 08/07/2018 11:14  PFTs   Although the FEV1 and FVC are reduced, the FEV1/FVC ratio is increased. The airway resistance is normal. The lung volumes are reduced. Following administration of bronchodilators, there is no significant response. The reduced diffusing capacity indicates a moderate loss of functional alveolar capillary surface. However, the diffusing capacity was not corrected for the patient's hemoglobin.  Pulmonary Function Diagnosis: Moderate Restriction Moderate Diffusion Defect ____________________________________________  Echo 10/07/18: IMPRESSIONS    1. The left ventricle has  severely reduced systolic function of 10-62%. The cavity size is normal. There is no increased left ventricular wall thickness. Echo evidence of restrictive diastolic relaxation Elevated mean left atrial pressure Left  ventricular diffuse hypokinesis.  2. The right ventricle has mildly reduced systolic function. The cavity in mildly enlarged in size. There is no increase in right ventricular wall thickness. Right ventricular systolic pressure is severely elevated.  3. Moderately dilated left atrial size.  4. Mildly dilated right atrial size.  5. The tricuspid valve is normal in structure. Tricuspid valve regurgitation is moderate-severe.  6. The aortic valve has an indeterminant number of cusps. There is moderate calcification of the aortic valve. Severe AS (mean gradient 25 mmHg and AVA 0.69 cm2).  7. The inferior vena cava was dilated in size with >50% respiratory variability.  8. Possible small oscillating density on anterior MV annulus vs aortic valve; consider TEE if clinically indicated.  9. Severe LV dysfunction; restrictive filling; calcified aortic valve with severe AS; biatrial enlargement; mild MR; mild RVE with mild RV dysfunction; moderate to severe TR with severe pulmonary hypertension; cannot R/O oscillating density as described  above; consider TEE.  ASSESSMENT AND PLAN:  1.  Acute systolic CHF. Class 3 symptoms improved to class 2 with medical therapy. CT showed pulmonary edema and small effusions. Repeat Echo showed a marked decrease in EF from last May from 50-55% to 25-30%. This is most likely related to severe AS.  Surprisingly  prior cardiac cath showed fairly normal filling pressures, right heart pressures and cardiac output. Her aortic stenosis did not appear to be severe but I suspect was underestimated on  cath. . She had moderate restriction and diffusion abnormalities on PFTs- most likely related to CHF. Unresponsive to bronchodilators. No evidence of PE by CT.  I have  recommended repeat cardiac cath with anticipation of referral to our heart valve team for TAVR. Will continue current dose of lasix and Entresto. Repeat chemistries. Recent potassium elevated so we will see if we need to reduce potassium supplement.  2. CAD with distal LAD disease.  3. Severe low gradient  aortic stenosis.  4. Osteoarthritis. Will hold off on NSAIDs for now. May use tramadol PRN.   Signed, Billi Bright [name], Shaffer  10/15/2018 10:44 AM    [facility] [address], [place], [place], [address] Phone [phone], Fax [phone]

## 2018-10-12 NOTE — Progress Notes (Signed)
Cardiology Office Note   Date:  10/15/2018   ID:  [name], [name] 03-06-1946, MRN [id number]  PCP:  [name], MD  Cardiologist:   Peter [name], MD   No chief complaint on file.     History of Present Illness: Brittany Shaffer is a 73 y.o. female who is seen for follow up CHF. She has a history of RBBB and murmur. She was evaluated by our service in May 2019 when she presented with dyspnea and chest pain. Troponins were negative. Echo showed moderate calcific aortic stenosis. MAC, EF 50-55% and mild pulmonary HTN. She underwent R/LHC which revealed near normal coronary arteries with only moderate stenosis (50%) in the apical LAD, with high normal filling pressures, normal pulmonary pressure, normal cardiac output, mild AS, and moderate MR. AV gradient was 7 mm at cath. She was recommended to continue medial therapy.   She was seen 2 weeks ago for evaluation of  dyspnea that started in May 2019. It has persisted since then. She was tried on inhalers without improvement. She notes DOE such as playing tennis, walking up an incline, taking her trash out, or even walking across her home. She walks daily and notes she now needs to stop 3-4 times on her walk. She has chronic swelling in her left leg> right. She had a bad cold in December with cough but this has resolved. She feels worn out. At night she sometimes has to prop up to breath and at times sleeps in a recliner.   She was evaluated by Pulmonary- Dr Wonda Amis. CT chest showed no ILD but was consistent with mild edema. PFTs showed  moderate restriction and moderate diffusion abnormality.   After our last visit we obtained lab work with a normal BMET and TSH. CT chest with contrast showed no evidence of PE but there were bilateral pleural effusions and evidence of interstitial pulmonary edema. Repeat Echo showed marked decrease in LV systolic function with EF 25-30% and restrictive filling parameters. Severe pulmonary HTN. Severe AS (low  flow) with mean gradient of 25 mm Hg but AV are 0.69 cm squared. Based on these findings she was started on lasix 40 mg daily with potassium and Entresto 24/26 mg bid. After starting the [facility] she developed hypotension and dose was reduced by half.   On follow up today she notes her breathing has improved and her lower extremity edema has resolved. BP is better on lower dose of Entresto. She still gets SOB going up hills. Has to stop and rest. Feels weak. Weight is down 9 lbs.     Past Medical History:  Diagnosis Date  . Abnormality of gait 10/20/2014  . Arthritis    "was in my hips; still in my knees" (01/13/2018)  . DJD (degenerative joint disease), ankle and foot 10/20/2014  . Dyslipidemia 01/14/2018  . HAMMER TOE, ACQUIRED 07/28/2007   Qualifier: Diagnosis of  By: Lorelei Pont MD, Frederico Hamman    . Hamstring tightness of left lower extremity 03/15/2015   Persistent left hamstring tightness and pain for the last month. Her right mild scoliosis is likely contributing to her tightness. - Provided hamstring compression wrap. - Heel lifts placed on insoles to reduce hamstring stretch. Provided with thinner set as well. - Limit explosive activities such as competitive tennis for at least one week and slowly ease back into it.  - Strengthening and stretc  . Heart murmur   . Leg cramps   . Metatarsalgia of left foot 10/20/2014  .  Mild coronary artery disease 01/14/2018   LAD with 50% stenosis on [facility] 12/2017  . Nonrheumatic aortic valve stenosis   . OA (osteoarthritis) of hip 03/24/2013  . Osteoarthritis of knee    Spurring and effusion noted on [place] on Left   . PLANTAR FASCIITIS 07/28/2007   Qualifier: Diagnosis of  By: Lorelei Pont MD, Frederico Hamman    . Unstable angina ([place]) 01/13/2018  . Varicose veins of bilateral lower extremities with other complications 04/06/2777    Past Surgical History:  Procedure Laterality Date  . ABDOMINAL HYSTERECTOMY  03/03/2012   Procedure: HYSTERECTOMY ABDOMINAL;  Surgeon: Alvino Chapel, MD;  Location: [facility];  Service: Gynecology;  Laterality: N/A;  . BLEPHAROPLASTY Bilateral   . JOINT REPLACEMENT    . 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 RIGHT/LEFT HEART CATH AND CORONARY ANGIOGRAPHY N/A 01/14/2018   Procedure: RIGHT/LEFT HEART CATH AND CORONARY ANGIOGRAPHY;  Surgeon: Wellington Hampshire, MD;  Location: [place] [facility];  Service: Cardiovascular;  Laterality: N/A;  . SALPINGOOPHORECTOMY  03/03/2012   Procedure: SALPINGO OOPHERECTOMY;  Surgeon: Alvino Chapel, MD;  Location: [facility];  Service: Gynecology;  Laterality: Bilateral;  . TONSILLECTOMY    . TOTAL HIP ARTHROPLASTY Left 03/24/2013   Procedure: LEFT TOTAL HIP ARTHROPLASTY ANTERIOR APPROACH;  Surgeon: [name], MD;  Location: [facility];  Service: Orthopedics;  Laterality: Left;  . TOTAL HIP ARTHROPLASTY Right 12/18/2016   Procedure: RIGHT TOTAL HIP ARTHROPLASTY ANTERIOR APPROACH;  Surgeon: Gaynelle Arabian, MD;  Location: [facility];  Service: Orthopedics;  Laterality: Right;  . TUBAL LIGATION       Current Outpatient Medications  Medication Sig Dispense Refill  . aspirin EC 81 MG tablet Take 81 mg by mouth daily.    [name] estradiol (CLIMARA - DOSED IN MG/24 HR) 0.025 mg/24hr patch PLACE 1 PATCH (0.025 MG TOTAL) ONTO THE SKIN ONCE A WEEK. 4 patch 12  . etodolac (LODINE) 500 MG tablet Take 1 tablet (500 mg total) by mouth daily. 30 tablet 6  . furosemide (LASIX) 20 MG tablet Take 2 tablets ( 40 mg ) daily 90 tablet 3  . potassium chloride SA (K-DUR,KLOR-CON) 20 MEQ tablet Take 2 tablets ( 40 meq ) daily 90 tablet 3  . sacubitril-valsartan (ENTRESTO) 24-26 MG Take 1/2 tablet twice a day 60 tablet   . UNABLE TO FIND Hyland's Leg Cramps tablets: Take 1 tablet by mouth during the night as needed for leg cramps    . zolpidem (AMBIEN) 10 MG tablet Take 1  tablet (10 mg total) by mouth at bedtime as needed. For sleep 30 tablet 1   No current facility-administered medications for this visit.     Allergies:   Codeine    Social History:  The patient  reports that she has never smoked. She has never used smokeless tobacco. She reports current alcohol use of about 7.0 standard drinks of alcohol per week. She reports that she does not use drugs.   Family History:  The patient's family history includes Bone cancer in her father; Breast cancer in her maternal aunt; Diabetes in her maternal aunt; Hypertension in her mother; Lung cancer in her father; Ovarian cancer in her sister.    ROS:  Please see the history of present illness.   Otherwise, review of systems are positive for none.   All other systems are reviewed  and negative.    PHYSICAL EXAM: VS:  BP 108/76   Pulse 74   Ht 5' 7.5" (1.715 m)   Wt 143 lb (64.9 kg)   BMI 22.07 kg/m  , BMI Body mass index is 22.07 kg/m. GEN: Well nourished, well developed, in no acute distress  HEENT: normal  Neck: no JVD, carotid bruits, or masses Cardiac: RRR; gr 2/6 systolic murmur RUSB, S2 reduced. No rubs or gallops. Respiratory:  clear to auscultation bilaterally, normal work of breathing GI: soft, nontender, nondistended, + BS MS: no deformity or atrophy. No edema and left leg is larger than right. Skin: warm and dry, no rash Neuro:  Strength and sensation are intact Psych: euthymic mood, full affect   EKG:  EKG is not ordered today. The ekg ordered 10/05/18 demonstrates NSR rate 76, RBBB, T wave abnormality c/w inferolateral ischemia.    Recent Labs: 09/22/2018: Hemoglobin 13.4; Platelets 105.0 Repeated and verified X2. 10/05/2018: BUN 21; Creatinine, Ser 0.98; Potassium 4.3; Sodium 138; TSH 2.810    Lipid Panel    Component Value Date/Time   CHOL 193 01/14/2018 0212   TRIG 55 01/14/2018 0212   HDL 85 01/14/2018 0212   CHOLHDL 2.3 01/14/2018 0212   VLDL 11 01/14/2018 0212   LDLCALC 97  01/14/2018 0212    Labs dated 10/12/18: cholesterol 231, triglycerides 86, HDL 83, LDL 131. Creatinine 1.12, potassium 5.8. ALT and CBC normal.   Wt Readings from Last 3 Encounters:  10/15/18 143 lb (64.9 kg)  10/05/18 152 lb 3.2 oz (69 kg)  09/22/18 150 lb (68 kg)      Other studies Reviewed: Additional studies/ records that were reviewed today include:  Echo 01/14/18: Study Conclusions  - Left ventricle: The cavity size was normal. Systolic function was   normal. The estimated ejection fraction was in the range of 50%   to 55%. Wall motion was normal; there were no regional wall   motion abnormalities. The study is not technically sufficient to   allow evaluation of LV diastolic function. - Aortic valve: Severe diffuse calcification involving the right   coronary and noncoronary cusp. There was moderate stenosis. Mean   gradient (S): 21 mm Hg. Valve area (VTI): 0.75 cm^2. Valve area   (Vmax): 0.84 cm^2. Valve area (Vmean): 0.8 cm^2. - Mitral valve: Mild diffuse calcification of the anterior leaflet.   There was mild regurgitation. - Left atrium: The atrium was severely dilated. - Right ventricle: The cavity size was mildly dilated. Wall   thickness was normal. - Pulmonary arteries: PA peak pressure: 36 mm Hg (S). - Inferior vena cava: The vessel was mildly dilated.  Impressions:  - The right ventricular systolic pressure was increased consistent   with mild pulmonary hypertension.  Cardiac cath 01/14/18:  RIGHT/LEFT HEART CATH AND CORONARY ANGIOGRAPHY  Conclusion     Dist LAD lesion is 50% stenosed.   1.  Near normal coronary arteries with only moderate stenosis in the apical LAD. 2.  Right heart catheterization showed high normal filling pressures, normal pulmonary pressure and normal cardiac output. 3.  Mild aortic stenosis with a mean gradient of 7 mmHg and valve area of 2.7 cm.  Recommendations: No clear explanation for the patient's anginal symptoms based  on this.  I did review her echocardiogram and EF appears to be around 50%.  She has moderate aortic stenosis by echo but seems to be mild by cath today.  She does have moderate mitral regurgitation as well. Recommend continuing medical therapy.  Indications   Unstable angina (HCC) [I20.0 (ICD-10-CM)]  Nonrheumatic aortic valve stenosis [I35.0 (ICD-10-CM)]  Procedural Details   Technical Details Procedural Details: The pre-existing IV in the right antecubital vein was exchanged under sterile fashion to a slender sheath. Right heart catheterization was performed using a 5 French Swan-Ganz catheter. Cardiac output was calculated by the Fick method. The right wrist was prepped, draped, and anesthetized with 1% lidocaine. Using the modified Seldinger technique, a 5 French sheath was introduced into the right radial artery. 3 mg of verapamil was administered through the sheath, weight-based unfractionated heparin was administered intravenously. A Jackie catheter was used for selective coronary angiography. The aortic valve was noted to be moderately calcified and could not be crossed with a Jacky catheter over a pigtail catheter. Thus, I used a straight wire over an AL-1 catheter and was able to cross and record LV pressure with pullback. Left ventricular angiography was not performed. Catheter exchanges were performed over an exchange length guidewire. There were no immediate procedural complications. A TR band was used for radial hemostasis at the completion of the procedure. The patient was transferred to the post catheterization recovery area for further monitoring.   Estimated blood loss <50 mL.  During this procedure the patient was administered the following to achieve and maintain moderate conscious sedation: Versed 1 mg, Fentanyl 25 mcg, while the patient's heart rate, blood pressure, and oxygen saturation were continuously monitored. The period of conscious sedation was 38 minutes, of which I  was present face-to-face 100% of this time.  Medications  (Filter: Administrations occurring from 01/14/18 1101 to 01/14/18 1203)  Medication Rate/Dose/Volume Action  Date Time   midazolam (VERSED) injection (mg) 1 mg Given 01/14/18 1116   Total dose as of 10/05/18 0945        1 mg        fentaNYL (SUBLIMAZE) injection (mcg) 25 mcg Given 01/14/18 1116   Total dose as of 10/05/18 0945        25 mcg        heparin infusion 2 units/mL in 0.9 % sodium chloride (mL) 500 mL New Bag/Given 01/14/18 1116   Total dose as of 10/05/18 0945 500 mL New Bag/Given 1116   Cannot be calculated        lidocaine (PF) (XYLOCAINE) 1 % injection (mL) 2 mL Given 01/14/18 1121   Total dose as of 10/05/18 0945 2 mL Given 1129   4 mL        Radial Cocktail/Verapamil only (mL) 10 mL Given 01/14/18 1132   Total dose as of 10/05/18 0945        10 mL        heparin injection (Units) 3,500 Units Given 01/14/18 1135   Total dose as of 10/05/18 0945        3,500 Units        iohexol (OMNIPAQUE) 350 MG/ML injection (mL) 70 mL Given 01/14/18 1155   Total dose as of 10/05/18 0945        70 mL        0.9 % sodium chloride infusion (mL) *Not included in total MAR Hold 01/14/18 1101   Dosing weight:  69.9 kg        Total dose as of 10/05/18 0945        Cannot be calculated        acetaminophen (TYLENOL) tablet 650 mg (mg) *Not included in total MAR Hold 01/14/18 1101   Dosing weight:  69.9 kg  Total dose as of 10/05/18 0945        Cannot be calculated        aspirin EC tablet 81 mg (mg) *Not included in total MAR Hold 01/14/18 1101   Dosing weight:  69.9 kg        Total dose as of 10/05/18 0945        Cannot be calculated        atorvastatin (LIPITOR) tablet 40 mg (mg) *Not included in total MAR Hold 01/14/18 1101   Dosing weight:  69.9 kg        Total dose as of 10/05/18 0945        Cannot be calculated        etodolac (LODINE) capsule 500 mg (mg) *Not included in total MAR Hold 01/14/18 1101   Dosing  weight:  68.4 kg        Total dose as of 10/05/18 0945        Cannot be calculated        heparin injection 5,000 Units (Units) *Not included in total MAR Hold 01/14/18 1101   Dosing weight:  69.9 kg        Total dose as of 10/05/18 0945        Cannot be calculated        hydrALAZINE (APRESOLINE) injection 10 mg (mg) *Not included in total MAR Hold 01/14/18 1101   Dosing weight:  69.9 kg        Total dose as of 10/05/18 0945        Cannot be calculated        nitroGLYCERIN (NITROGLYN) 2 % ointment 1 inch (inch) *Not included in total MAR Hold 01/14/18 1101   Dosing weight:  69.9 kg *Not included in total Automatically Held 1200   Total dose as of 10/05/18 0945        Cannot be calculated        nitroGLYCERIN (NITROSTAT) SL tablet 0.4 mg (mg) *Not included in total MAR Hold 01/14/18 1101   Dosing weight:  69.9 kg        Total dose as of 10/05/18 0945        Cannot be calculated        ondansetron (ZOFRAN) injection 4 mg (mg) *Not included in total MAR Hold 01/14/18 1101   Dosing weight:  69.9 kg        Total dose as of 10/05/18 0945        Cannot be calculated        sodium chloride flush (NS) 0.9 % injection 3 mL (mL) *Not included in total [facility] Hold 01/14/18 1101   Dosing weight:  69.9 kg        Total dose as of 10/05/18 0945        Cannot be calculated        sodium chloride flush (NS) 0.9 % injection 3 mL (mL) *Not included in total MAR Hold 01/14/18 1101   Dosing weight:  69.9 kg        Total dose as of 10/05/18 0945        Cannot be calculated        zolpidem (AMBIEN) tablet 5 mg (mg) *Not included in total MAR Hold 01/14/18 1101   Total dose as of 10/05/18 0945        Cannot be calculated        Sedation Time   Sedation Time Physician-1: 37 minutes 26 seconds  Coronary Findings  Diagnostic  Dominance: Right  Left Main  Vessel is angiographically normal.  Left Anterior Descending  Dist LAD lesion 50% stenosed  Dist LAD lesion is 50% stenosed.  First Diagonal Branch    Vessel is angiographically normal.  Second Diagonal Branch  Vessel is angiographically normal.  Third Diagonal Branch  Vessel is angiographically normal.  Left Circumflex  Second Obtuse Marginal Branch  Vessel is angiographically normal.  Right Coronary Artery  Vessel is angiographically normal.  Right Posterior Descending Artery  Vessel is angiographically normal.  Right Posterior Atrioventricular Branch  Vessel is angiographically normal.  First Right Posterolateral  Vessel is angiographically normal.  Second Right Posterolateral  Vessel is angiographically normal.  Intervention   No interventions have been documented.  Coronary Diagrams   Diagnostic  Dominance: Right    Intervention   Implants    No implant documentation for this case.  Syngo Images   Show images for CARDIAC CATHETERIZATION  MERGE Images   Show images for CARDIAC CATHETERIZATION   Link to Procedure Log   Procedure Log    Hemo Data    Most Recent Value  Fick Cardiac Output 5.01 L/min  Fick Cardiac Output Index 2.78 (L/min)/BSA  Aortic Mean Gradient 7.1 mmHg  Aortic Peak Gradient 5 mmHg  Aortic Valve Area 2.70  Aortic Value Area Index 1.5 cm2/BSA  RA A Wave 6 mmHg  RA V Wave 4 mmHg  RA Mean 3 mmHg  RV Systolic Pressure 32 mmHg  RV Diastolic Pressure 1 mmHg  RV EDP 5 mmHg  PA Systolic Pressure 32 mmHg  PA Diastolic Pressure 11 mmHg  PA Mean 20 mmHg  PW A Wave 13 mmHg  PW V Wave 18 mmHg  PW Mean 13 mmHg  AO Systolic Pressure 716 mmHg  AO Diastolic Pressure 61 mmHg  AO Mean 85 mmHg  LV Systolic Pressure 967 mmHg  LV Diastolic Pressure 7 mmHg  LV EDP 17 mmHg  AOp Systolic Pressure 893 mmHg  AOp Diastolic Pressure 66 mmHg  AOp Mean Pressure 90 mmHg  LVp Systolic Pressure 810 mmHg  LVp Diastolic Pressure 7 mmHg  LVp EDP Pressure 18 mmHg  QP/QS 1  TPVR Index 7.17 HRUI  TSVR Index 30.51 HRUI  PVR SVR Ratio 0.09  TPVR/TSVR Ratio 0.24   PORTABLE CHEST 1 VIEW  COMPARISON:   02/28/2012  FINDINGS: Hyperinflation. Cardiomegaly. Mild diffuse interstitial opacity. No pleural effusion. No pneumothorax. Scoliosis of the spine.  IMPRESSION: 1. Mild cardiomegaly 2. Mild diffuse increased interstitial opacity bilaterally may reflect minimal edema or interstitial inflammation, suspect that there may be component of underlying chronic interstitial change.   Electronically Signed   By: Donavan Foil M.D.   On: 01/13/2018 20:34  CLINICAL DATA:  Dyspnea with exertion for several months. Interstitial prominence on prior chest radiograph.  EXAM: CT CHEST WITHOUT CONTRAST  TECHNIQUE: Multidetector CT imaging of the chest was performed following the standard protocol without intravenous contrast. High resolution imaging of the lungs, as well as inspiratory and expiratory imaging, was performed.  COMPARISON:  01/13/2018 chest radiograph.  FINDINGS: Cardiovascular: Mild cardiomegaly. No significant pericardial effusion/thickening. Left anterior descending coronary atherosclerosis. Atherosclerotic nonaneurysmal thoracic aorta. Top-normal caliber main pulmonary artery (3.2 cm diameter).  Mediastinum/Nodes: No discrete thyroid nodules. Unremarkable esophagus. No pathologically enlarged axillary, mediastinal or hilar lymph nodes, noting limited sensitivity for the detection of hilar adenopathy on this noncontrast study.  Lungs/Pleura: No pneumothorax. No pleural effusion. A few scattered small solid pulmonary nodules in both lungs, largest 4 mm  in the anterior left lower lobe (series 5/image 119). No acute consolidative airspace disease or lung masses. Moderate patchy air trapping in both lungs on the expiration sequence. There is interlobular septal thickening throughout both lungs, most prominent in the lower lungs. A few scattered small parenchymal bands are present in the mid to lower lungs bilaterally. No significant regions of subpleural  reticulation, ground-glass attenuation, traction bronchiectasis, architectural distortion or frank honeycombing.  Upper abdomen: No acute abnormality.  Musculoskeletal: No aggressive appearing focal osseous lesions. Moderate thoracic spondylosis.  IMPRESSION: 1. Moderate air trapping throughout both lungs, indicative of small airways disease. 2. Cardiomegaly. Interlobular septal thickening throughout both lungs, most prominent in the lower lungs, most compatible with mild pulmonary edema. 3. Scattered parenchymal bands in the mid to lower lungs bilaterally, compatible with nonspecific postinfectious/postinflammatory scarring. No compelling findings of interstitial lung disease at this time. 4. Scattered small solid pulmonary nodules, largest 4 mm. No follow-up needed if patient is low-risk (and has no known or suspected primary neoplasm). Non-contrast chest CT can be considered in 12 months if patient is high-risk. This recommendation follows the consensus statement: Guidelines for Management of Incidental Pulmonary Nodules Detected on CT Images:From the Fleischner Society 2017; published online before print (10.1148/radiol.5176160737). 5. One vessel coronary atherosclerosis.  Aortic Atherosclerosis (ICD10-I70.0).   Electronically Signed   By: Ilona Sorrel M.D.   On: 08/07/2018 11:14  PFTs   Although the FEV1 and FVC are reduced, the FEV1/FVC ratio is increased. The airway resistance is normal. The lung volumes are reduced. Following administration of bronchodilators, there is no significant response. The reduced diffusing capacity indicates a moderate loss of functional alveolar capillary surface. However, the diffusing capacity was not corrected for the patient's hemoglobin.  Pulmonary Function Diagnosis: Moderate Restriction Moderate Diffusion Defect ____________________________________________  Echo 10/07/18: IMPRESSIONS    1. The left ventricle has  severely reduced systolic function of 10-62%. The cavity size is normal. There is no increased left ventricular wall thickness. Echo evidence of restrictive diastolic relaxation Elevated mean left atrial pressure Left  ventricular diffuse hypokinesis.  2. The right ventricle has mildly reduced systolic function. The cavity in mildly enlarged in size. There is no increase in right ventricular wall thickness. Right ventricular systolic pressure is severely elevated.  3. Moderately dilated left atrial size.  4. Mildly dilated right atrial size.  5. The tricuspid valve is normal in structure. Tricuspid valve regurgitation is moderate-severe.  6. The aortic valve has an indeterminant number of cusps. There is moderate calcification of the aortic valve. Severe AS (mean gradient 25 mmHg and AVA 0.69 cm2).  7. The inferior vena cava was dilated in size with >50% respiratory variability.  8. Possible small oscillating density on anterior MV annulus vs aortic valve; consider TEE if clinically indicated.  9. Severe LV dysfunction; restrictive filling; calcified aortic valve with severe AS; biatrial enlargement; mild MR; mild RVE with mild RV dysfunction; moderate to severe TR with severe pulmonary hypertension; cannot R/O oscillating density as described  above; consider TEE.  ASSESSMENT AND PLAN:  1.  Acute systolic CHF. Class 3 symptoms improved to class 2 with medical therapy. CT showed pulmonary edema and small effusions. Repeat Echo showed a marked decrease in EF from last May from 50-55% to 25-30%. This is most likely related to severe AS.  Surprisingly  prior cardiac cath showed fairly normal filling pressures, right heart pressures and cardiac output. Her aortic stenosis did not appear to be severe but I suspect was underestimated on  cath. . She had moderate restriction and diffusion abnormalities on PFTs- most likely related to CHF. Unresponsive to bronchodilators. No evidence of PE by CT.  I have  recommended repeat cardiac cath with anticipation of referral to our heart valve team for TAVR. Will continue current dose of lasix and Entresto. Repeat chemistries. Recent potassium elevated so we will see if we need to reduce potassium supplement.  2. CAD with distal LAD disease.  3. Severe low gradient  aortic stenosis.  4. Osteoarthritis. Will hold off on NSAIDs for now. May use tramadol PRN.   Signed, Peter [name], MD  10/15/2018 10:44 AM    [facility] [address], [place], [place], [address] Phone [phone], Fax [phone]

## 2018-10-12 NOTE — Telephone Encounter (Signed)
Spoke with patient and she took 2 of her Entresto on Saturday for the first time. On Sunday stated she could hardly walk from one room to another secondary to feeling so tired and dizzy. She went to her nieces house who has a blood pressure machine. Blood pressure was checked 3 times with readings mid 70's/60's. She took the one [place] today and feels fine. She does not want to continue Entresto twice a day. Swelling in legs better.   Also wants to know if she should be taking ASA 81 mg daily.   Ok to leave message on cell  Will forward to Dr [name] for review.

## 2018-10-12 NOTE — Telephone Encounter (Signed)
Returned call to patient Dr.Jordan's recommendation given.Advised to keep appointment with Dr.Jordan as planned 10/15/18 at 10:00 am.

## 2018-10-12 NOTE — Telephone Encounter (Signed)
Have her take 1/2 Entresto bid. She should take ASA 81 mg daily. We are seeing her this week  Peter [name] MD, [facility]

## 2018-10-12 NOTE — Telephone Encounter (Signed)
Follow Up:; ° ° °Returning your call. °

## 2018-10-12 NOTE — Telephone Encounter (Signed)
New Message   Pt c/o medication issue:  1. Name of Medication: Entresto   2. How are you currently taking this medication (dosage and times per day)? 2 x daily  24/26mg    3. Are you having a reaction (difficulty breathing--STAT)? No   4. What is your medication issue? Pt says when she would take the 2 pills she would have no energy and she couldn't do anything. Yesterday she only took 1 pill and she wants to check and see if that's okay. She said Sunday she had no energy to do anything and she checked her BP 3 times and each time it was low. She said she would feel Dizzy.

## 2018-10-12 NOTE — Telephone Encounter (Signed)
Left message to call back  

## 2018-10-13 ENCOUNTER — Telehealth: Payer: Self-pay

## 2018-10-13 NOTE — Telephone Encounter (Signed)
Prior authorization for [facility] send to plan.Key # [id number].Waiting for approval.

## 2018-10-15 ENCOUNTER — Emergency Department (HOSPITAL_BASED_OUTPATIENT_CLINIC_OR_DEPARTMENT_OTHER)
Admission: EM | Admit: 2018-10-15 | Discharge: 2018-10-15 | Disposition: A | Discharge status: Home / Self Care | Payer: PPO | Attending: Emergency Medicine | Admitting: Emergency Medicine

## 2018-10-15 ENCOUNTER — Other Ambulatory Visit: Payer: Self-pay | Admitting: Cardiology

## 2018-10-15 ENCOUNTER — Other Ambulatory Visit: Payer: Self-pay

## 2018-10-15 ENCOUNTER — Ambulatory Visit: Payer: PPO | Admitting: Cardiology

## 2018-10-15 ENCOUNTER — Encounter (HOSPITAL_BASED_OUTPATIENT_CLINIC_OR_DEPARTMENT_OTHER): Payer: Self-pay

## 2018-10-15 ENCOUNTER — Encounter: Payer: Self-pay | Admitting: Cardiology

## 2018-10-15 VITALS — BP 108/76 | HR 74 | Ht 67.5 in | Wt 143.0 lb

## 2018-10-15 DIAGNOSIS — Z0000 Encounter for general adult medical examination without abnormal findings: Secondary | ICD-10-CM | POA: Diagnosis not present

## 2018-10-15 DIAGNOSIS — I5021 Acute systolic (congestive) heart failure: Secondary | ICD-10-CM

## 2018-10-15 DIAGNOSIS — I2510 Atherosclerotic heart disease of native coronary artery without angina pectoris: Secondary | ICD-10-CM | POA: Insufficient documentation

## 2018-10-15 DIAGNOSIS — R899 Unspecified abnormal finding in specimens from other organs, systems and tissues: Secondary | ICD-10-CM

## 2018-10-15 DIAGNOSIS — Z96643 Presence of artificial hip joint, bilateral: Secondary | ICD-10-CM | POA: Insufficient documentation

## 2018-10-15 DIAGNOSIS — Z1389 Encounter for screening for other disorder: Secondary | ICD-10-CM | POA: Diagnosis not present

## 2018-10-15 DIAGNOSIS — R9431 Abnormal electrocardiogram [ECG] [EKG]: Secondary | ICD-10-CM | POA: Diagnosis not present

## 2018-10-15 DIAGNOSIS — R799 Abnormal finding of blood chemistry, unspecified: Secondary | ICD-10-CM | POA: Diagnosis not present

## 2018-10-15 DIAGNOSIS — I200 Unstable angina: Secondary | ICD-10-CM | POA: Diagnosis not present

## 2018-10-15 DIAGNOSIS — Z7982 Long term (current) use of aspirin: Secondary | ICD-10-CM | POA: Insufficient documentation

## 2018-10-15 DIAGNOSIS — I350 Nonrheumatic aortic (valve) stenosis: Secondary | ICD-10-CM | POA: Diagnosis not present

## 2018-10-15 DIAGNOSIS — R252 Cramp and spasm: Secondary | ICD-10-CM | POA: Insufficient documentation

## 2018-10-15 DIAGNOSIS — E875 Hyperkalemia: Secondary | ICD-10-CM | POA: Diagnosis not present

## 2018-10-15 LAB — COMPREHENSIVE METABOLIC PANEL
ALBUMIN: 4.3 g/dL (ref 3.5–5.0)
ALT: 13 U/L (ref 0–44)
AST: 23 U/L (ref 15–41)
Alkaline Phosphatase: 66 U/L (ref 38–126)
Anion gap: 9 (ref 5–15)
BUN: 37 mg/dL — AB (ref 8–23)
CO2: 25 mmol/L (ref 22–32)
Calcium: 9.4 mg/dL (ref 8.9–10.3)
Chloride: 102 mmol/L (ref 98–111)
Creatinine, Ser: 1.12 mg/dL — ABNORMAL HIGH (ref 0.44–1.00)
GFR calc Af Amer: 56 mL/min — ABNORMAL LOW (ref >60)
GFR calc non Af Amer: 48 mL/min — ABNORMAL LOW (ref >60)
GLUCOSE: 121 mg/dL — AB (ref 70–99)
POTASSIUM: 4.5 mmol/L (ref 3.5–5.1)
Sodium: 136 mmol/L (ref 135–145)
Total Bilirubin: 0.5 mg/dL (ref 0.3–1.2)
Total Protein: 7.2 g/dL (ref 6.5–8.1)

## 2018-10-15 LAB — CBC WITH DIFFERENTIAL/PLATELET
Abs Immature Granulocytes: 0.01 10*3/uL (ref 0.00–0.07)
Basophils Absolute: 0 10*3/uL (ref 0.0–0.1)
Basophils Relative: 1 %
Eosinophils Absolute: 0.1 10*3/uL (ref 0.0–0.5)
Eosinophils Relative: 2 %
HCT: 44.2 % (ref 36.0–46.0)
Hemoglobin: 14.2 g/dL (ref 12.0–15.0)
IMMATURE GRANULOCYTES: 0 %
LYMPHS ABS: 1.4 10*3/uL (ref 0.7–4.0)
Lymphocytes Relative: 29 %
MCH: 32.6 pg (ref 26.0–34.0)
MCHC: 32.1 g/dL (ref 30.0–36.0)
MCV: 101.6 fL — ABNORMAL HIGH (ref 80.0–100.0)
Monocytes Absolute: 0.5 10*3/uL (ref 0.1–1.0)
Monocytes Relative: 10 %
Neutro Abs: 2.8 10*3/uL (ref 1.7–7.7)
Neutrophils Relative %: 58 %
Platelets: 234 10*3/uL (ref 150–400)
RBC: 4.35 MIL/uL (ref 3.87–5.11)
RDW: 13.3 % (ref 11.5–15.5)
WBC: 4.9 10*3/uL (ref 4.0–10.5)
nRBC: 0 % (ref 0.0–0.2)

## 2018-10-15 NOTE — ED Triage Notes (Addendum)
Pt states she was seen by cards and PCP today-both advised her K+ level was elevated-cards called her and advised K+ 7-NAD-steady gait

## 2018-10-15 NOTE — ED Notes (Signed)
Pt on monitor 

## 2018-10-15 NOTE — ED Provider Notes (Signed)
[facility] EMERGENCY DEPARTMENT Provider Note   CSN: [id number] Arrival date & time: 10/15/18  1812     History   Chief Complaint Chief Complaint  Patient presents with  . Abnormal Lab    HPI Brittany Shaffer is a 73 y.o. female presenting evaluation of hyperkalemia.  Pt states she has a h/o valve issues causing acute congestive failure, with plan for TAVR.  As such, has been having multiple medication changes recently.  She was started on Lasix, and began to have bad leg cramps.  She was then started on potassium pills, and leg cramps improved.  Patient states she was at her cardiologist office today when they checked her blood work and found her potassium to be elevated, and she was told to come to the ER.  She reports no chest pain or shortness of breath.  She denies current cramping or pain.  She denies recent fevers, chills, nausea, vomiting, domino pain, urinary symptoms, normal bowel movements.  She denies change in diet.  Additional history obtained from chart review.  Patient with a history of mild CAD, nonrheumatic aortic valve stenosis, CHF.  HPI  Past Medical History:  Diagnosis Date  . Abnormality of gait 10/20/2014  . Arthritis    "was in my hips; still in my knees" (01/13/2018)  . DJD (degenerative joint disease), ankle and foot 10/20/2014  . Dyslipidemia 01/14/2018  . HAMMER TOE, ACQUIRED 07/28/2007   Qualifier: Diagnosis of  By: Lorelei Pont MD, Frederico Hamman    . Hamstring tightness of left lower extremity 03/15/2015   Persistent left hamstring tightness and pain for the last month. Her right mild scoliosis is likely contributing to her tightness. - Provided hamstring compression wrap. - Heel lifts placed on insoles to reduce hamstring stretch. Provided with thinner set as well. - Limit explosive activities such as competitive tennis for at least one week and slowly ease back into it.  - Strengthening and stretc  . Heart murmur   . Leg cramps   . Metatarsalgia of left  foot 10/20/2014  . Mild coronary artery disease 01/14/2018   LAD with 50% stenosis on [facility] 12/2017  . Nonrheumatic aortic valve stenosis   . OA (osteoarthritis) of hip 03/24/2013  . Osteoarthritis of knee    Spurring and effusion noted on [place] on Left   . PLANTAR FASCIITIS 07/28/2007   Qualifier: Diagnosis of  By: Lorelei Pont MD, Frederico Hamman    . Unstable angina ([place]) 01/13/2018  . Varicose veins of bilateral lower extremities with other complications 4/0/9811    Patient Active Problem List   Diagnosis Date Noted  . Dyslipidemia 01/14/2018  . Mild coronary artery disease 01/14/2018  . Nonrheumatic aortic valve stenosis   . Unstable angina ([place]) 01/13/2018  . Varicose veins of bilateral lower extremities with other complications 91/47/8295  . Hamstring tightness of left lower extremity 03/15/2015  . Metatarsalgia of left foot 10/20/2014  . Abnormality of gait 10/20/2014  . DJD (degenerative joint disease), ankle and foot 10/20/2014  . Postoperative anemia due to acute blood loss 03/25/2013  . OA (osteoarthritis) of hip 03/24/2013  . Osteoarthritis of knee   . PLANTAR FASCIITIS 07/28/2007  . HAMMER TOE, ACQUIRED 07/28/2007    Past Surgical History:  Procedure Laterality Date  . ABDOMINAL HYSTERECTOMY  03/03/2012   Procedure: HYSTERECTOMY ABDOMINAL;  Surgeon: Alvino Chapel, MD;  Location: [facility];  Service: Gynecology;  Laterality: N/A;  . BLEPHAROPLASTY Bilateral   . JOINT REPLACEMENT    . 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 RIGHT/LEFT HEART CATH AND CORONARY ANGIOGRAPHY N/A 01/14/2018   Procedure: RIGHT/LEFT HEART CATH AND CORONARY ANGIOGRAPHY;  Surgeon: Wellington Hampshire, MD;  Location: [facility];  Service: Cardiovascular;  Laterality: N/A;  . SALPINGOOPHORECTOMY  03/03/2012   Procedure: SALPINGO OOPHERECTOMY;  Surgeon:  Alvino Chapel, MD;  Location: [facility];  Service: Gynecology;  Laterality: Bilateral;  . TONSILLECTOMY    . TOTAL HIP ARTHROPLASTY Left 03/24/2013   Procedure: LEFT TOTAL HIP ARTHROPLASTY ANTERIOR APPROACH;  Surgeon: [name], MD;  Location: [facility];  Service: Orthopedics;  Laterality: Left;  . TOTAL HIP ARTHROPLASTY Right 12/18/2016   Procedure: RIGHT TOTAL HIP ARTHROPLASTY ANTERIOR APPROACH;  Surgeon: Gaynelle Arabian, MD;  Location: [facility];  Service: Orthopedics;  Laterality: Right;  . TUBAL LIGATION       OB History    Gravida  2   Para  2   Term  2   Preterm      AB      Living  2     SAB      TAB      Ectopic      Multiple      Live Births               Home Medications    Prior to Admission medications   Medication Sig Start Date End Date Taking? Authorizing Provider  aspirin EC 81 MG tablet Take 81 mg by mouth daily.    [provider]  estradiol (CLIMARA - DOSED IN MG/24 HR) 0.025 mg/24hr patch PLACE 1 PATCH (0.025 MG TOTAL) ONTO THE SKIN ONCE A WEEK. 03/11/18   Fontaine, Belinda Block, MD  etodolac (LODINE) 500 MG tablet Take 1 tablet (500 mg total) by mouth daily. 10/05/18   [name], [name], MD  furosemide (LASIX) 20 MG tablet Take 2 tablets ( 40 mg ) daily 10/09/18   [name], [name], MD  potassium chloride SA (K-DUR,KLOR-CON) 20 MEQ tablet Take 2 tablets ( 40 meq ) daily 10/09/18   [name], [name], MD  sacubitril-valsartan [facility]) 24-26 MG Take 1/2 tablet twice a day 10/12/18   [name], [name], MD  UNABLE TO FIND Hyland's Leg Cramps tablets: Take 1 tablet by mouth during the night as needed for leg cramps    [provider]  zolpidem (AMBIEN) 10 MG tablet Take 1 tablet (10 mg total) by mouth at bedtime as needed. For sleep 03/11/18   Fontaine, Belinda Block, MD    Family History Family History  Problem Relation Age of Onset  . Hypertension Mother   . Bone cancer Father   . Lung cancer Father   . Ovarian cancer Sister   . Diabetes  Maternal Aunt   . Breast cancer Maternal Aunt        Age 53's    Social History Social History   Tobacco Use  . Smoking status: Never Smoker  . Smokeless tobacco: Never Used  Substance Use Topics  . Alcohol use: Yes    Alcohol/week: 7.0 standard drinks    Types: 7 Standard drinks or equivalent per week    Comment: 1 / PER DAY  . Drug use: No     Allergies   Codeine   Review of Systems Review of Systems  Musculoskeletal: Positive for myalgias (occational nighttime leg cramps, improving).  All other  systems reviewed and are negative.    Physical Exam Updated Vital Signs BP 116/83   Pulse 63   Temp 97.9 F (36.6 C) (Oral)   Resp 15   Ht 5\' 6"  (1.676 m)   Wt 64.9 kg   SpO2 97%   BMI 23.08 kg/m   Physical Exam Vitals signs and nursing note reviewed.  Constitutional:      General: She is not in acute distress.    Appearance: She is well-developed.     Comments: Resting in the bed in NAD  HENT:     Head: Normocephalic and atraumatic.  Eyes:     Conjunctiva/sclera: Conjunctivae normal.     Pupils: Pupils are equal, round, and reactive to light.  Neck:     Musculoskeletal: Normal range of motion and neck supple.  Cardiovascular:     Rate and Rhythm: Normal rate and regular rhythm.     Pulses: Normal pulses.  Pulmonary:     Effort: Pulmonary effort is normal. No respiratory distress.     Breath sounds: Normal breath sounds. No wheezing.  Abdominal:     General: Bowel sounds are normal. There is no distension.     Palpations: Abdomen is soft.     Tenderness: There is no abdominal tenderness.  Musculoskeletal: Normal range of motion.  Skin:    General: Skin is warm and dry.     Capillary Refill: Capillary refill takes less than 2 seconds.  Neurological:     Mental Status: She is alert and oriented to person, place, and time.      ED Treatments / Results  Labs (all labs ordered are listed, but only abnormal results are displayed) Labs Reviewed  CBC  WITH DIFFERENTIAL/PLATELET - Abnormal; Notable for the following components:      Result Value   MCV 101.6 (*)    All other components within normal limits  COMPREHENSIVE METABOLIC PANEL - Abnormal; Notable for the following components:   Glucose, Bld 121 (*)    BUN 37 (*)    Creatinine, Ser 1.12 (*)    GFR calc non Af Amer 48 (*)    GFR calc Af Amer 56 (*)    All other components within normal limits    EKG EKG Interpretation  Date/Time:  Thursday October 15 2018 19:00:35 EST Ventricular Rate:  69 PR Interval:  192 QRS Duration: 178 QT Interval:  488 QTC Calculation: 522 R Axis:   110 Text Interpretation:  Normal sinus rhythm Right bundle branch block Left posterior fascicular block  Bifascicular block  Left ventricular hypertrophy with repolarization abnormality Abnormal ECG Similar pattern to prior. No STEMI.  Confirmed by Nanda Quinton [id number]) on 10/15/2018 7:03:55 PM   Radiology No results found.  Procedures Procedures (including critical care time)  Medications Ordered in ED Medications - No data to display   Initial Impression / Assessment and Plan / ED Course  I have reviewed the triage vital signs and the nursing notes.  Pertinent labs & imaging results that were available during my care of the patient were reviewed by me and considered in my medical decision making (see chart for details).     Pt presenting for evaluation after her potassium was found to be elevated at her cardiology office today.  No new sxs, cramping, or chest pain.  Labs reviewed from cardiology office, K 7.0. will recheck labs and EKG and reassess.   Labs reassuring, potassium 4.5.  Creatinine at baseline.  No other acute electrolyte abnormality.  EKG without peaked T waves or U waves.  Unchanged from previous.  As patient is symptom-free, and potassium was normal on our evaluation, consider possible hemolysis or lab error.  As such, I do not think patient needs to be admitted or emergently  managed at this time.  Discussed importance of follow-up with her PCP and cardiology at scheduled appointment.  Patient to continue taking her home medications as prescribed.  At this time, patient appears safe for discharge.  Return precautions given.  Patient states she understands and agrees to plan.   Final Clinical Impressions(s) / ED Diagnoses   Final diagnoses:  Abnormal laboratory test    ED Discharge Orders    None       Franchot Heidelberg, PA-C 10/15/18 2347    Margette Fast, MD 10/16/18 1032

## 2018-10-15 NOTE — Patient Instructions (Signed)
    [facility] [place] [address] Dept: [phone] Loc: [place]  10/15/2018  You are scheduled for a Cardiac Cath on Friday 10/23/18, with Dr.Jordan.  1. Please arrive at the [facility] (Main Entrance A) at [facility]: [address] [place], [place] [address] at 5:30 am (This time is two hours before your procedure to ensure your preparation). Free valet parking service is available.   Special note: Every effort is made to have your procedure done on time. Please understand that emergencies sometimes delay scheduled procedures.  2. Diet: Do not eat solid foods after midnight.  The patient may have clear liquids until 5am upon the day of the procedure.  3. Labs: You will need to have blood drawn today 2/13. You do not need to be fasting.  4. Medication instructions in preparation for your procedure:    Hold Furosemide morning of cath          On the morning of your procedure, take your Aspirin 81 mg and any morning medicines NOT listed above.  You may use sips of water.  5. Plan for one night stay--bring personal belongings. 6. Bring a current list of your medications and current insurance cards. 7. You MUST have a responsible person to drive you home. 8. Someone MUST be with you the first 24 hours after you arrive home or your discharge will be delayed. 9. Please wear clothes that are easy to get on and off and wear slip-on shoes.  Thank you for allowing [place] to care for you!   -- [facility]

## 2018-10-15 NOTE — Discharge Instructions (Addendum)
Continue taking home medications as prescribed. Your potassium today was normal. Follow-up with your primary care doctor and your cardiologist as scheduled. Return to the emergency room if you develop chest pain, difficulty breathing, or any new, worsening, concerning symptoms.

## 2018-10-16 ENCOUNTER — Other Ambulatory Visit: Payer: Self-pay

## 2018-10-16 LAB — BASIC METABOLIC PANEL
BUN/Creatinine Ratio: 29 — ABNORMAL HIGH (ref 12–28)
BUN: 34 mg/dL — ABNORMAL HIGH (ref 8–27)
CO2: 22 mmol/L (ref 20–29)
Calcium: 10.3 mg/dL (ref 8.7–10.3)
Chloride: 97 mmol/L (ref 96–106)
Creatinine, Ser: 1.19 mg/dL — ABNORMAL HIGH (ref 0.57–1.00)
GFR, EST AFRICAN AMERICAN: 52 mL/min/{1.73_m2} — AB (ref >59)
GFR, EST NON AFRICAN AMERICAN: 45 mL/min/{1.73_m2} — AB (ref >59)
Glucose: 103 mg/dL — ABNORMAL HIGH (ref 65–99)
Potassium: 7 mmol/L (ref 3.5–5.2)
Sodium: 135 mmol/L (ref 134–144)

## 2018-10-16 LAB — CBC WITH DIFFERENTIAL/PLATELET
Basophils Absolute: 0 10*3/uL (ref 0.0–0.2)
Basos: 1 %
EOS (ABSOLUTE): 0.1 10*3/uL (ref 0.0–0.4)
Eos: 3 %
Hematocrit: 44 % (ref 34.0–46.6)
Hemoglobin: 15.3 g/dL (ref 11.1–15.9)
Immature Grans (Abs): 0 10*3/uL (ref 0.0–0.1)
Immature Granulocytes: 0 %
Lymphocytes Absolute: 1.4 10*3/uL (ref 0.7–3.1)
Lymphs: 26 %
MCH: 33.8 pg — ABNORMAL HIGH (ref 26.6–33.0)
MCHC: 34.8 g/dL (ref 31.5–35.7)
MCV: 97 fL (ref 79–97)
Monocytes Absolute: 0.6 10*3/uL (ref 0.1–0.9)
Monocytes: 11 %
Neutrophils Absolute: 3.1 10*3/uL (ref 1.4–7.0)
Neutrophils: 59 %
PLATELETS: 185 10*3/uL (ref 150–450)
RBC: 4.53 x10E6/uL (ref 3.77–5.28)
RDW: 12.8 % (ref 11.7–15.4)
WBC: 5.2 10*3/uL (ref 3.4–10.8)

## 2018-10-16 LAB — BRAIN NATRIURETIC PEPTIDE: BNP: 222.3 pg/mL — ABNORMAL HIGH (ref 0.0–100.0)

## 2018-10-19 ENCOUNTER — Other Ambulatory Visit: Payer: Self-pay | Admitting: Cardiology

## 2018-10-22 ENCOUNTER — Telehealth: Payer: Self-pay | Admitting: *Deleted

## 2018-10-22 NOTE — Telephone Encounter (Signed)
Pt contacted pre-catheterization scheduled at [facility] for: Friday October 23, 2018 7:30 AM Verified arrival time and place: [facility] Entrance A at: 5:30 AM  No solid food after midnight prior to cath, clear liquids until 5 AM day of procedure. Contrast allergy: no  Hold: Furosemide-AM of procedure KCl-AM of procedure. Entresto-AM of procedure-GFR 48  Except hold medications AM meds can be  taken pre-cath with sip of water including: ASA 81 mg  Confirm patient has responsible person to drive home post procedure and observe 24 hours after arriving home.  Left detailed message with instructions (DPR) at phone number listed.

## 2018-10-23 ENCOUNTER — Encounter (HOSPITAL_COMMUNITY)
Admission: RE | Disposition: A | Discharge status: Home / Self Care | Payer: Self-pay | Source: Ambulatory Visit | Attending: Cardiology

## 2018-10-23 ENCOUNTER — Ambulatory Visit (HOSPITAL_COMMUNITY)
Admission: RE | Admit: 2018-10-23 | Discharge: 2018-10-23 | Disposition: A | Discharge status: Home / Self Care | Payer: PPO | Source: Ambulatory Visit | Attending: Cardiology | Admitting: Cardiology

## 2018-10-23 DIAGNOSIS — M1990 Unspecified osteoarthritis, unspecified site: Secondary | ICD-10-CM | POA: Diagnosis not present

## 2018-10-23 DIAGNOSIS — Z8249 Family history of ischemic heart disease and other diseases of the circulatory system: Secondary | ICD-10-CM | POA: Insufficient documentation

## 2018-10-23 DIAGNOSIS — Z79899 Other long term (current) drug therapy: Secondary | ICD-10-CM | POA: Insufficient documentation

## 2018-10-23 DIAGNOSIS — I350 Nonrheumatic aortic (valve) stenosis: Secondary | ICD-10-CM | POA: Insufficient documentation

## 2018-10-23 DIAGNOSIS — I5043 Acute on chronic combined systolic (congestive) and diastolic (congestive) heart failure: Secondary | ICD-10-CM | POA: Diagnosis present

## 2018-10-23 DIAGNOSIS — I5021 Acute systolic (congestive) heart failure: Secondary | ICD-10-CM | POA: Diagnosis not present

## 2018-10-23 DIAGNOSIS — E785 Hyperlipidemia, unspecified: Secondary | ICD-10-CM | POA: Insufficient documentation

## 2018-10-23 DIAGNOSIS — Z90722 Acquired absence of ovaries, bilateral: Secondary | ICD-10-CM | POA: Insufficient documentation

## 2018-10-23 DIAGNOSIS — I8393 Asymptomatic varicose veins of bilateral lower extremities: Secondary | ICD-10-CM | POA: Insufficient documentation

## 2018-10-23 DIAGNOSIS — Z7982 Long term (current) use of aspirin: Secondary | ICD-10-CM | POA: Diagnosis not present

## 2018-10-23 DIAGNOSIS — I2510 Atherosclerotic heart disease of native coronary artery without angina pectoris: Secondary | ICD-10-CM | POA: Insufficient documentation

## 2018-10-23 DIAGNOSIS — R0609 Other forms of dyspnea: Secondary | ICD-10-CM | POA: Insufficient documentation

## 2018-10-23 DIAGNOSIS — Z90710 Acquired absence of both cervix and uterus: Secondary | ICD-10-CM | POA: Diagnosis not present

## 2018-10-23 HISTORY — PX: RIGHT/LEFT HEART CATH AND CORONARY ANGIOGRAPHY: CATH118266

## 2018-10-23 LAB — POCT I-STAT EG7
Acid-Base Excess: 2 mmol/L (ref 0.0–2.0)
BICARBONATE: 27.4 mmol/L (ref 20.0–28.0)
CALCIUM ION: 1.19 mmol/L (ref 1.15–1.40)
HCT: 39 % (ref 36.0–46.0)
Hemoglobin: 13.3 g/dL (ref 12.0–15.0)
O2 Saturation: 74 %
Potassium: 3.8 mmol/L (ref 3.5–5.1)
Sodium: 141 mmol/L (ref 135–145)
TCO2: 29 mmol/L (ref 22–32)
pCO2, Ven: 44.4 mmHg (ref 44.0–60.0)
pH, Ven: 7.398 (ref 7.250–7.430)
pO2, Ven: 39 mmHg (ref 32.0–45.0)

## 2018-10-23 LAB — POCT I-STAT 7, (LYTES, BLD GAS, ICA,H+H)
Acid-Base Excess: 1 mmol/L (ref 0.0–2.0)
Bicarbonate: 26 mmol/L (ref 20.0–28.0)
Calcium, Ion: 1.22 mmol/L (ref 1.15–1.40)
HCT: 39 % (ref 36.0–46.0)
Hemoglobin: 13.3 g/dL (ref 12.0–15.0)
O2 Saturation: 94 %
POTASSIUM: 3.9 mmol/L (ref 3.5–5.1)
Sodium: 140 mmol/L (ref 135–145)
TCO2: 27 mmol/L (ref 22–32)
pCO2 arterial: 41.6 mmHg (ref 32.0–48.0)
pH, Arterial: 7.404 (ref 7.350–7.450)
pO2, Arterial: 71 mmHg — ABNORMAL LOW (ref 83.0–108.0)

## 2018-10-23 SURGERY — RIGHT/LEFT HEART CATH AND CORONARY ANGIOGRAPHY
Anesthesia: LOCAL

## 2018-10-23 MED ORDER — LIDOCAINE HCL (PF) 1 % IJ SOLN
INTRAMUSCULAR | Status: DC | PRN
  Administered 2018-10-23 (×2): 2 mL via INTRADERMAL

## 2018-10-23 MED ORDER — VERAPAMIL HCL 2.5 MG/ML IV SOLN
INTRAVENOUS | Status: AC
  Filled 2018-10-23: qty 2

## 2018-10-23 MED ORDER — ASPIRIN 81 MG PO CHEW: 81.0000 mg | CHEWABLE_TABLET | ORAL | Status: DC

## 2018-10-23 MED ORDER — SODIUM CHLORIDE 0.9 % IV SOLN: 250.0000 mL | INTRAVENOUS | Status: DC | PRN

## 2018-10-23 MED ORDER — FENTANYL CITRATE (PF) 100 MCG/2ML IJ SOLN
INTRAMUSCULAR | Status: DC | PRN
  Administered 2018-10-23: 25 ug via INTRAVENOUS

## 2018-10-23 MED ORDER — SODIUM CHLORIDE 0.9 % WEIGHT BASED INFUSION: 1.0000 mL/kg/h | INTRAVENOUS | Status: DC

## 2018-10-23 MED ORDER — HEPARIN (PORCINE) IN NACL 1000-0.9 UT/500ML-% IV SOLN
INTRAVENOUS | Status: DC | PRN
  Administered 2018-10-23 (×2): 500 mL

## 2018-10-23 MED ORDER — SODIUM CHLORIDE 0.9% FLUSH: 3.0000 mL | Freq: Two times a day (BID) | INTRAVENOUS | Status: DC

## 2018-10-23 MED ORDER — HEPARIN SODIUM (PORCINE) 1000 UNIT/ML IJ SOLN
INTRAMUSCULAR | Status: AC
  Filled 2018-10-23: qty 1

## 2018-10-23 MED ORDER — SODIUM CHLORIDE 0.9% FLUSH: 3.0000 mL | INTRAVENOUS | Status: DC | PRN

## 2018-10-23 MED ORDER — ONDANSETRON HCL 4 MG/2ML IJ SOLN: 4.0000 mg | Freq: Four times a day (QID) | INTRAMUSCULAR | Status: DC | PRN

## 2018-10-23 MED ORDER — FENTANYL CITRATE (PF) 100 MCG/2ML IJ SOLN
INTRAMUSCULAR | Status: AC
  Filled 2018-10-23: qty 2

## 2018-10-23 MED ORDER — SODIUM CHLORIDE 0.9 % IV SOLN
INTRAVENOUS | Status: DC
  Administered 2018-10-23: 07:00:00 via INTRAVENOUS

## 2018-10-23 MED ORDER — IOHEXOL 350 MG/ML SOLN
INTRAVENOUS | Status: DC | PRN
  Administered 2018-10-23: 80 mL via INTRACARDIAC

## 2018-10-23 MED ORDER — MIDAZOLAM HCL 2 MG/2ML IJ SOLN
INTRAMUSCULAR | Status: AC
  Filled 2018-10-23: qty 2

## 2018-10-23 MED ORDER — HEPARIN (PORCINE) IN NACL 1000-0.9 UT/500ML-% IV SOLN
INTRAVENOUS | Status: AC
  Filled 2018-10-23: qty 500

## 2018-10-23 MED ORDER — ACETAMINOPHEN 325 MG PO TABS: 650.0000 mg | ORAL_TABLET | ORAL | Status: DC | PRN

## 2018-10-23 MED ORDER — MIDAZOLAM HCL 2 MG/2ML IJ SOLN
INTRAMUSCULAR | Status: DC | PRN
  Administered 2018-10-23: 1 mg via INTRAVENOUS

## 2018-10-23 MED ORDER — VERAPAMIL HCL 2.5 MG/ML IV SOLN
INTRAVENOUS | Status: DC | PRN
  Administered 2018-10-23: 10 mL via INTRA_ARTERIAL

## 2018-10-23 MED ORDER — HEPARIN SODIUM (PORCINE) 1000 UNIT/ML IJ SOLN
INTRAMUSCULAR | Status: DC | PRN
  Administered 2018-10-23: 3500 [IU] via INTRAVENOUS

## 2018-10-23 MED ORDER — LIDOCAINE HCL (PF) 1 % IJ SOLN
INTRAMUSCULAR | Status: AC
  Filled 2018-10-23: qty 30

## 2018-10-23 SURGICAL SUPPLY — 17 items
CATH 5FR JL3.5 JR4 ANG PIG MP (CATHETERS) ×2 IMPLANT
CATH BALLN WEDGE 5F 110CM (CATHETERS) ×2 IMPLANT
CATH INFINITI 5FR JL4 (CATHETERS) ×2 IMPLANT
CATH INFINITI JR4 5F (CATHETERS) ×2 IMPLANT
DEVICE RAD COMP TR BAND LRG (VASCULAR PRODUCTS) ×2 IMPLANT
GLIDESHEATH SLEND SS 6F .021 (SHEATH) ×2 IMPLANT
GUIDEWIRE .025 260CM (WIRE) ×2 IMPLANT
GUIDEWIRE INQWIRE 1.5J.035X260 (WIRE) ×1 IMPLANT
INQWIRE 1.5J .035X260CM (WIRE) ×2
KIT HEART LEFT (KITS) ×2 IMPLANT
PACK CARDIAC CATHETERIZATION (CUSTOM PROCEDURE TRAY) ×2 IMPLANT
SHEATH GLIDE SLENDER 4/5FR (SHEATH) ×2 IMPLANT
SYR MEDRAD MARK 7 150ML (SYRINGE) ×2 IMPLANT
TRANSDUCER W/STOPCOCK (MISCELLANEOUS) ×2 IMPLANT
TUBING CIL FLEX 10 FLL-RA (TUBING) ×2 IMPLANT
WIRE EMERALD ST .035X150CM (WIRE) ×2 IMPLANT
WIRE HI TORQ VERSACORE-J 145CM (WIRE) ×4 IMPLANT

## 2018-10-23 NOTE — Interval H&P Note (Signed)
History and Physical Interval Note:  10/23/2018 7:14 AM  Brittany Shaffer  has presented today for surgery, with the diagnosis of aort sten  The various methods of treatment have been discussed with the patient and family. After consideration of risks, benefits and other options for treatment, the patient has consented to  Procedure(s): RIGHT/LEFT HEART CATH AND CORONARY ANGIOGRAPHY (N/A) as a surgical intervention .  The patient's history has been reviewed, patient examined, no change in status, stable for surgery.  I have reviewed the patient's chart and labs.  Questions were answered to the patient's satisfaction.     Collier Salina [facility] 10/23/2018 7:14 AM

## 2018-10-23 NOTE — Discharge Instructions (Signed)
Radial Site Care ° °This sheet gives you information about how to care for yourself after your procedure. Your health care provider may also give you more specific instructions. If you have problems or questions, contact your health care provider. °What can I expect after the procedure? °After the procedure, it is common to have: °· Bruising and tenderness at the catheter insertion area. °Follow these instructions at home: °Medicines °· Take over-the-counter and prescription medicines only as told by your health care provider. °Insertion site care °· Follow instructions from your health care provider about how to take care of your insertion site. Make sure you: °? Wash your hands with soap and water before you change your bandage (dressing). If soap and water are not available, use hand sanitizer. °? Change your dressing as told by your health care provider. °? Leave stitches (sutures), skin glue, or adhesive strips in place. These skin closures may need to stay in place for 2 weeks or longer. If adhesive strip edges start to loosen and curl up, you may trim the loose edges. Do not remove adhesive strips completely unless your health care provider tells you to do that. °· Check your insertion site every day for signs of infection. Check for: °? Redness, swelling, or pain. °? Fluid or blood. °? Pus or a bad smell. °? Warmth. °· Do not take baths, swim, or use a hot tub until your health care provider approves. °· You may shower 24-48 hours after the procedure, or as directed by your health care provider. °? Remove the dressing and gently wash the site with plain soap and water. °? Pat the area dry with a clean towel. °? Do not rub the site. That could cause bleeding. °· Do not apply powder or lotion to the site. °Activity ° °· For 24 hours after the procedure, or as directed by your health care provider: °? Do not flex or bend the affected arm. °? Do not push or pull heavy objects with the affected arm. °? Do not  drive yourself home from the hospital or clinic. You may drive 24 hours after the procedure unless your health care provider tells you not to. °? Do not operate machinery or power tools. °· Do not lift anything that is heavier than 10 lb (4.5 kg), or the limit that you are told, until your health care provider says that it is safe. °· Ask your health care provider when it is okay to: °? Return to work or school. °? Resume usual physical activities or sports. °? Resume sexual activity. °General instructions °· If the catheter site starts to bleed, raise your arm and put firm pressure on the site. If the bleeding does not stop, get help right away. This is a medical emergency. °· If you went home on the same day as your procedure, a responsible adult should be with you for the first 24 hours after you arrive home. °· Keep all follow-up visits as told by your health care provider. This is important. °Contact a health care provider if: °· You have a fever. °· You have redness, swelling, or yellow drainage around your insertion site. °Get help right away if: °· You have unusual pain at the radial site. °· The catheter insertion area swells very fast. °· The insertion area is bleeding, and the bleeding does not stop when you hold steady pressure on the area. °· Your arm or hand becomes pale, cool, tingly, or numb. °These symptoms may represent a serious problem   that is an emergency. Do not wait to see if the symptoms will go away. Get medical help right away. Call your local emergency services (911 in the [place] Do not drive yourself to the hospital. °Summary °· After the procedure, it is common to have bruising and tenderness at the site. °· Follow instructions from your health care provider about how to take care of your radial site wound. Check the wound every day for signs of infection. °· Do not lift anything that is heavier than 10 lb (4.5 kg), or the limit that you are told, until your health care provider says  that it is safe. °This information is not intended to replace advice given to you by your health care provider. Make sure you discuss any questions you have with your health care provider. °Document Released: 09/21/2010 Document Revised: 09/24/2017 Document Reviewed: 09/24/2017 °Elsevier Interactive Patient Education © 2019 [employer] ° °

## 2018-10-26 ENCOUNTER — Encounter (HOSPITAL_COMMUNITY): Payer: Self-pay | Admitting: Cardiology

## 2018-10-26 ENCOUNTER — Telehealth: Payer: Self-pay | Admitting: Cardiology

## 2018-10-26 DIAGNOSIS — I350 Nonrheumatic aortic (valve) stenosis: Secondary | ICD-10-CM

## 2018-10-26 NOTE — Telephone Encounter (Signed)
I will be contacting this pt today to discuss TAVR evaluation.

## 2018-10-26 NOTE — Telephone Encounter (Signed)
Per pt is calling to see when may get CT of femoral artery pt is needing TAVR and likes plan to  ahead has beach trip coming up in April  and daughter in law is having hip surgery and will need to help with grandkids Will forward to Dr [name] and Elly Modena LPN for review and recommendations

## 2018-10-26 NOTE — Telephone Encounter (Signed)
° ° °  Patient calling with f/u cath questions

## 2018-10-26 NOTE — Telephone Encounter (Signed)
I spoke with the pt at length and have arranged TAVR consult with Dr Angelena Form on 10/30/2018 at 8:45 AM.  I will go ahead and arrange additional TAVR testing.

## 2018-10-30 ENCOUNTER — Ambulatory Visit: Payer: PPO | Admitting: Cardiovascular Disease

## 2018-10-30 ENCOUNTER — Institutional Professional Consult (permissible substitution): Payer: PPO | Admitting: Cardiovascular Disease

## 2018-10-30 ENCOUNTER — Encounter: Payer: Self-pay | Admitting: Cardiovascular Disease

## 2018-10-30 ENCOUNTER — Observation Stay (HOSPITAL_COMMUNITY)
Admission: RE | Admit: 2018-10-30 | Discharge: 2018-10-30 | Disposition: A | Discharge status: Home / Self Care | Payer: PPO | Source: Ambulatory Visit | Attending: Cardiovascular Disease | Admitting: Cardiovascular Disease

## 2018-10-30 VITALS — BP 104/62 | HR 66 | Ht 67.5 in | Wt 146.1 lb

## 2018-10-30 DIAGNOSIS — Z01818 Encounter for other preprocedural examination: Secondary | ICD-10-CM | POA: Insufficient documentation

## 2018-10-30 DIAGNOSIS — I350 Nonrheumatic aortic (valve) stenosis: Secondary | ICD-10-CM | POA: Diagnosis not present

## 2018-10-30 DIAGNOSIS — I5022 Chronic systolic (congestive) heart failure: Secondary | ICD-10-CM

## 2018-10-30 NOTE — Progress Notes (Signed)
Cardiology Office Note:    Date:  10/30/2018   ID:  Brittany Shaffer 1946/01/24, MRN [id number]  PCP:  [name], MD  Cardiologist:  No primary care provider on file.  Electrophysiologist:  None   Referring MD: [name], MD   Chief Complaint  Patient presents with  . Shortness of Breath    History of Present Illness:    Brittany Shaffer is a 73 y.o. female presenting for evaluation of aortic stenosis, referred by Dr [name].   She is here alone today. She's developed progressive shortness of breath over the past year. She was previously very active, walking regularly and playing tennis without exertional symptoms. In May 2019 she presented with chest pain and shortness of breath. An echo showed moderate aortic stenosis and she underwent right and left heart catheterization demonstrating no obstructive CAD, elevated filling pressures, moderate MR, and only mild aortic stenosis. She has continued to experience exertional dyspnea and she underwent formal pulmonary evaluation. She had no evidence of COPD or interstitial lung disease. She was seen in follow-up by Dr [name] about one month ago and underwent a CTA of the chest to evaluate for PE. This demonstrated no PE but showed bilateral pleural effusions and interstitial edema consistent with CHF. An updated echo demonstrated progressive LV systolic dysfunction, now with LVEF approximately 30%.  A repeat heart catheterization demonstrated moderate nonobstructive disease in the distal LAD without any other significant coronary stenoses.  The patient's mean transaortic gradient was 27 mmHg.  The patient has had a heart murmur dating back to when she was in college. States that they 'ran dye from the arm' and took some pictures. She was told that maybe she had rheumatic fever as a child. She was asymptomatic throughout her adult life until the past year when her initial echo was done in May 2019.  She has had shortness of breath since  that time.  Notes that her symptoms progressed early this year leading to her more recent evaluation outlined above.  She had developed very severe dyspnea with New York Heart Association functional class III symptoms as well as orthopnea, PND, and leg edema.  She is now clinically improved after starting a loop diuretic and further medical therapy for her congestive heart failure including the use of Entresto.  She still remains limited from shortness of breath but her functional capacity is much better.  She is able to do her normal activities of daily living without too much trouble.  Orthopnea and PND have resolved.  She has had no chest pain or pressure.  The patient lives locally in [place].  She has been widowed for about 1 year.  Past Medical History:  Diagnosis Date  . Abnormality of gait 10/20/2014  . Arthritis    "was in my hips; still in my knees" (01/13/2018)  . DJD (degenerative joint disease), ankle and foot 10/20/2014  . Dyslipidemia 01/14/2018  . HAMMER TOE, ACQUIRED 07/28/2007   Qualifier: Diagnosis of  By: Lorelei Pont MD, Frederico Hamman    . Hamstring tightness of left lower extremity 03/15/2015   Persistent left hamstring tightness and pain for the last month. Her right mild scoliosis is likely contributing to her tightness. - Provided hamstring compression wrap. - Heel lifts placed on insoles to reduce hamstring stretch. Provided with thinner set as well. - Limit explosive activities such as competitive tennis for at least one week and slowly ease back into it.  - Strengthening and stretc  . Heart  murmur   . Leg cramps   . Metatarsalgia of left foot 10/20/2014  . Mild coronary artery disease 01/14/2018   LAD with 50% stenosis on [facility] 12/2017  . Nonrheumatic aortic valve stenosis   . OA (osteoarthritis) of hip 03/24/2013  . Osteoarthritis of knee    Spurring and effusion noted on [place] on Left   . PLANTAR FASCIITIS 07/28/2007   Qualifier: Diagnosis of  By: Lorelei Pont MD, Frederico Hamman    . Unstable  angina ([place]) 01/13/2018  . Varicose veins of bilateral lower extremities with other complications 09/10/4172    Past Surgical History:  Procedure Laterality Date  . ABDOMINAL HYSTERECTOMY  03/03/2012   Procedure: HYSTERECTOMY ABDOMINAL;  Surgeon: Alvino Chapel, MD;  Location: [facility];  Service: Gynecology;  Laterality: N/A;  . BLEPHAROPLASTY Bilateral   . JOINT REPLACEMENT    . 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 RIGHT/LEFT HEART CATH AND CORONARY ANGIOGRAPHY N/A 01/14/2018   Procedure: RIGHT/LEFT HEART CATH AND CORONARY ANGIOGRAPHY;  Surgeon: Wellington Hampshire, MD;  Location: [facility];  Service: Cardiovascular;  Laterality: N/A;  . RIGHT/LEFT HEART CATH AND CORONARY ANGIOGRAPHY N/A 10/23/2018   Procedure: RIGHT/LEFT HEART CATH AND CORONARY ANGIOGRAPHY;  Surgeon: [name], [name], MD;  Location: [facility];  Service: Cardiovascular;  Laterality: N/A;  . SALPINGOOPHORECTOMY  03/03/2012   Procedure: SALPINGO OOPHERECTOMY;  Surgeon: Alvino Chapel, MD;  Location: [facility];  Service: Gynecology;  Laterality: Bilateral;  . TONSILLECTOMY    . TOTAL HIP ARTHROPLASTY Left 03/24/2013   Procedure: LEFT TOTAL HIP ARTHROPLASTY ANTERIOR APPROACH;  Surgeon: [name], MD;  Location: [facility];  Service: Orthopedics;  Laterality: Left;  . TOTAL HIP ARTHROPLASTY Right 12/18/2016   Procedure: RIGHT TOTAL HIP ARTHROPLASTY ANTERIOR APPROACH;  Surgeon: Gaynelle Arabian, MD;  Location: [facility];  Service: Orthopedics;  Laterality: Right;  . TUBAL LIGATION      Current Medications: Current Meds  Medication Sig  . acetaminophen (TYLENOL) 500 MG tablet Take 500-1,000 mg by mouth every 6 (six) hours as needed for moderate pain.  [name] aspirin EC 81 MG tablet Take 81 mg by mouth daily.  . diclofenac sodium (VOLTAREN) 1 % GEL Apply 1  application topically 4 (four) times daily as needed (pain).  [name] estradiol (CLIMARA - DOSED IN MG/24 HR) 0.025 mg/24hr patch PLACE 1 PATCH (0.025 MG TOTAL) ONTO THE SKIN ONCE A WEEK.  [name] etodolac (LODINE) 500 MG tablet Take 1 tablet (500 mg total) by mouth daily.  . famotidine (PEPCID AC) 10 MG tablet Take 10 mg by mouth daily as needed for heartburn or indigestion.  . furosemide (LASIX) 20 MG tablet Take 1 tablet (20 mg total) by mouth daily.  . Menthol-Methyl Salicylate (MUSCLE RUB EX) Apply 1 application topically daily as needed (muscle pain).  . potassium chloride SA (K-DUR,KLOR-CON) 20 MEQ tablet Take 1 tablet (20 mEq total) by mouth daily.  . sacubitril-valsartan (ENTRESTO) 24-26 MG Take 0.5 tablets by mouth 2 (two) times daily.   [name] UNABLE TO FIND Hyland's Leg Cramps tablets: Take 1 tablet by mouth during the night as needed for leg cramps  . zolpidem (AMBIEN) 10 MG tablet Take 1 tablet (10 mg total) by mouth at bedtime as needed. For sleep     Allergies:   Codeine   Social History   Socioeconomic History  .  Marital status: Widowed    Spouse name: Not on file  . Number of children: Not on file  . Years of education: Not on file  . Highest education level: Not on file  Occupational History  . Not on file  Social Needs  . Financial resource strain: Not on file  . Food insecurity:    Worry: Not on file    Inability: Not on file  . Transportation needs:    Medical: Not on file    Non-medical: Not on file  Tobacco Use  . Smoking status: Never Smoker  . Smokeless tobacco: Never Used  Substance and Sexual Activity  . Alcohol use: Yes    Alcohol/week: 7.0 standard drinks    Types: 7 Standard drinks or equivalent per week    Comment: 1 / PER DAY  . Drug use: No  . Sexual activity: Not Currently    Birth control/protection: Surgical    Comment: 1st intercourse 73 yo-Fewer than 5 partners  Lifestyle  . Physical activity:    Days per week: Not on file    Minutes per session:  Not on file  . Stress: Not on file  Relationships  . Social connections:    Talks on phone: Not on file    Gets together: Not on file    Attends religious service: Not on file    Active member of club or organization: Not on file    Attends meetings of clubs or organizations: Not on file    Relationship status: Not on file  Other Topics Concern  . Not on file  Social History Narrative   Lives locally.  Retired. Active - walks frequently and plays tennis regularly.     Family History: The patient's family history includes Bone cancer in her father; Breast cancer in her maternal aunt; Diabetes in her maternal aunt; Hypertension in her mother; Lung cancer in her father; Ovarian cancer in her sister.  ROS:   Please see the history of present illness.    Positive for back pain, excessive fatigue, leg pain.  All other systems reviewed and are negative.  EKGs/Labs/Other Studies Reviewed:    The following studies were reviewed today: 2D echocardiogram 10/07/2018: IMPRESSIONS    1. The left ventricle has severely reduced systolic function of 60-10%. The cavity size is normal. There is no increased left ventricular wall thickness. Echo evidence of restrictive diastolic relaxation Elevated mean left atrial pressure Left  ventricular diffuse hypokinesis.  2. The right ventricle has mildly reduced systolic function. The cavity in mildly enlarged in size. There is no increase in right ventricular wall thickness. Right ventricular systolic pressure is severely elevated.  3. Moderately dilated left atrial size.  4. Mildly dilated right atrial size.  5. The tricuspid valve is normal in structure. Tricuspid valve regurgitation is moderate-severe.  6. The aortic valve has an indeterminant number of cusps. There is moderate calcification of the aortic valve. Severe AS (mean gradient 25 mmHg and AVA 0.69 cm2).  7. The inferior vena cava was dilated in size with >50% respiratory variability.  8.  Possible small oscillating density on anterior MV annulus vs aortic valve; consider TEE if clinically indicated.  9. Severe LV dysfunction; restrictive filling; calcified aortic valve with severe AS; biatrial enlargement; mild MR; mild RVE with mild RV dysfunction; moderate to severe TR with severe pulmonary hypertension; cannot R/O oscillating density as described  above; consider TEE.  FINDINGS  Left Ventricle: The left ventricle has severely reduced systolic function of  25-30%. The cavity size is normal. There is no increased left ventricular wall thickness. Echo evidence of restrictive diastolic relaxation Elevated mean left atrial pressure  Left ventricular diffuse hypokinesis. There is akinesis of the entire inferolateral left ventricular segment. Right Ventricle: The right ventricle has mildly reduced systolic function. The cavity in mildly enlarged in size. There is no increase in right ventricular wall thickness. Right ventricular systolic pressure is severely elevated. Left Atrium: is moderately dilated Right Atrium: is mildly dilated Interatrial Septum: No atrial level shunt detected by color flow Doppler.  Pericardium: There is no evidence of pericardial effusion. Mitral Valve: The mitral valve is normal in structure There is mild thickening. There is mild mitral annular calcification present. Mitral valve regurgitation is mild to moderate by color flow Doppler. Tricuspid Valve: The tricuspid valve is normal in structure. Tricuspid valve regurgitation is moderate-severe by color flow Doppler. Aortic Valve: The aortic valve has an indeterminant number of cusps. There is moderate calcification of the aortic valve. Aortic valve regurgitation was not visualized by color flow Doppler. The calculated aortic valve area is 0.69 cm, consistent with  severe stenosis. Pulmonic Valve: The pulmonic valve is normal in structure. Pulmonic valve regurgitation is not visualized by color flow  Doppler. Venous: The inferior vena cava was dilated in size with greater than 50% respiratory variability.   LEFT VENTRICLE PLAX 2D (Teich) LV EF:          36.5 %   Diastology LVIDd:          5.10 cm  LV e' lateral:   5.44 cm/s LVIDs:          4.20 cm  LV E/e' lateral: 19.7 LV PW:          0.90 cm  LV e' medial:    3.92 cm/s LV IVS:         1.10 cm  LV E/e' medial:  27.3 LVOT diam:      2.00 cm LV SV:          45 ml LVOT Area:      3.14 cm  RIGHT VENTRICLE RV Basal diam:  4.70 cm RV S prime:     10.30 cm/s TAPSE (M-mode): 2.0 cm  LEFT ATRIUM             Index       RIGHT ATRIUM           Index LA diam:        5.10 cm 2.82 cm/m  RA Area:     19.60 cm LA Vol (A2C):   56.7 ml 31.32 ml/m RA Volume:   53.70 ml  29.66 ml/m LA Vol (A4C):   64.3 ml 35.51 ml/m LA Biplane Vol: 63.0 ml 34.80 ml/m  AORTIC VALVE AV Area (Vmax):    0.68 cm AV Area (Vmean):   0.65 cm AV Area (VTI):     0.69 cm AV Vmax:           312.40 cm/s AV Vmean:          237.200 cm/s AV VTI:            0.617 m AV Peak Grad:      39.0 mmHg AV Mean Grad:      25.0 mmHg LVOT Vmax:         67.20 cm/s LVOT Vmean:        49.300 cm/s LVOT VTI:          0.136 m LVOT/AV VTI  ratio: 0.22   AORTA Ao Root diam: 3.00 cm  MITRAL VALVE               TR Peak grad: 64.6 mmHg                            TR Vmax:      417.00 cm/s   MV Decel Time: 158 msec MR Peak grad: 97.2 mmHg MR Mean grad: 64.0 mmHg MR Vmax:      493.00 cm/s MR Vmean:     378.0 cm/s MV E velocity: 107.00 cm/s MV A velocity: 36.10 cm/s MV E/A ratio:  2.96  Cardiac catheterization 10/23/2018: Conclusion     Dist LAD lesion is 30% stenosed.  There is moderate left ventricular systolic dysfunction.  The left ventricular ejection fraction is 35-45% by visual estimate.  LV end diastolic pressure is normal.  There is moderate aortic valve stenosis.   1. Minimal nonobstructive CAD 2. Moderate LV dysfunction. EF 35-40%. 3. At least  moderate AS. Mean gradient 27 mm Hg. AVA may be underestimated due to decreased LV function 4. Normal LV filling pressures. 5. Normal right heart pressures.  Plan: will refer to Valvular heart team for consideration of TAVR.      Recommendations   Antiplatelet/Anticoag Recommend Aspirin 81mg  daily for moderate CAD.  Indications   Nonrheumatic aortic valve stenosis [I35.0 (ICD-10-CM)]  Procedural Details   Technical Details Indication: 73 yo WF with progressive aortic stenosis, worsening CHF and decrease in LV function.  Procedural Details: The right wrist was prepped, draped, and anesthetized with 1% lidocaine. Using the modified Seldinger technique a 6 Fr slender sheath was placed in the right radial artery and a 5 French sheath was placed in the right brachial vein. A Swan-Ganz catheter was used for the right heart catheterization. Standard protocol was followed for recording of right heart pressures and sampling of oxygen saturations. Fick cardiac output was calculated. Standard Judkins catheters were used for selective coronary angiography and left ventriculography. There were no immediate procedural complications. The patient was transferred to the post catheterization recovery area for further monitoring. Contrast: 80 cc Estimated blood loss <50 mL.   During this procedure medications were administered to achieve and maintain moderate conscious sedation while the patient's heart rate, blood pressure, and oxygen saturation were continuously monitored and I was present face-to-face 100% of this time.  Medications  (Filter: Administrations occurring from 10/23/18 0720 to 10/23/18 0809)  Medication Rate/Dose/Volume Action  Date Time   midazolam (VERSED) injection (mg) 1 mg Given 10/23/18 0732   Total dose as of 10/30/18 0856        1 mg        fentaNYL (SUBLIMAZE) injection (mcg) 25 mcg Given 10/23/18 0733   Total dose as of 10/30/18 0856        25 mcg        lidocaine (PF)  (XYLOCAINE) 1 % injection (mL) 2 mL Given 10/23/18 0734   Total dose as of 10/30/18 0856 2 mL Given 0737   4 mL        Radial Cocktail/Verapamil only (mL) 10 mL Given 10/23/18 0739   Total dose as of 10/30/18 0856        10 mL        heparin injection (Units) 3,500 Units Given 10/23/18 0747   Total dose as of 10/30/18 0856        3,500 Units  Heparin (Porcine) in NaCl 1000-0.9 UT/500ML-% SOLN (mL) 500 mL Given 10/23/18 0748   Total dose as of 10/30/18 0856 500 mL Given 0748   1,000 mL        iohexol (OMNIPAQUE) 350 MG/ML injection (mL) 80 mL Given 10/23/18 0806   Total dose as of 10/30/18 0856        80 mL        Sedation Time   Sedation Time Physician-1: 30 minutes 39 seconds  Complications   Complications documented before study signed (10/23/2018 8:13 AM EST)    No complications were associated with this study.  Documented by [name], [name], MD - 10/23/2018 8:08 AM EST    Coronary Findings   Diagnostic  Dominance: Right  Left Main  Vessel is angiographically normal.  Left Anterior Descending  Dist LAD lesion 30% stenosed  Dist LAD lesion is 30% stenosed.  First Diagonal Branch  Vessel is angiographically normal.  Second Diagonal Branch  Vessel is angiographically normal.  Third Diagonal Branch  Vessel is angiographically normal.  Left Circumflex  Second Obtuse Marginal Branch  Vessel is angiographically normal.  Right Coronary Artery  Vessel is angiographically normal.  Right Posterior Descending Artery  Vessel is angiographically normal.  Right Posterior Atrioventricular Branch  Vessel is angiographically normal.  First Right Posterolateral  Vessel is angiographically normal.  Second Right Posterolateral  Vessel is angiographically normal.  Intervention   No interventions have been documented.  Wall Motion   Resting               Left Heart   Left Ventricle The left ventricle is mildly dilated. There is moderate left ventricular systolic  dysfunction. LV end diastolic pressure is normal. The left ventricular ejection fraction is 35-45% by visual estimate. There are LV function abnormalities due to global hypokinesis.  Aortic Valve There is moderate aortic valve stenosis. The aortic valve is calcified. There is restricted aortic valve motion.  Coronary Diagrams   Diagnostic  Dominance: Right    Intervention   Implants    No implant documentation for this case.  Syngo Images   Show images for CARDIAC CATHETERIZATION  MERGE Images   Show images for CARDIAC CATHETERIZATION   Link to Procedure Log   Procedure Log    Hemo Data    Most Recent Value  Fick Cardiac Output 6.11 L/min  Fick Cardiac Output Index 3.44 (L/min)/BSA  Aortic Mean Gradient 27.39 mmHg  Aortic Peak Gradient 32 mmHg  Aortic Valve Area 1.21  Aortic Value Area Index 0.68 cm2/BSA  RA A Wave 6 mmHg  RA V Wave 6 mmHg  RA Mean 4 mmHg  RV Systolic Pressure 34 mmHg  RV Diastolic Pressure 4 mmHg  RV EDP 7 mmHg  PA Systolic Pressure 30 mmHg  PA Diastolic Pressure 11 mmHg  PA Mean 18 mmHg  PW A Wave 10 mmHg  PW V Wave 10 mmHg  PW Mean 9 mmHg  AO Systolic Pressure 481 mmHg  AO Diastolic Pressure 67 mmHg  AO Mean 88 mmHg  LV Systolic Pressure 856 mmHg  LV Diastolic Pressure 8 mmHg  LV EDP 18 mmHg  AOp Systolic Pressure 314 mmHg  AOp Diastolic Pressure 63 mmHg  AOp Mean Pressure 87 mmHg  LVp Systolic Pressure 970 mmHg  LVp Diastolic Pressure 7 mmHg  LVp EDP Pressure 16 mmHg  QP/QS 1  TPVR Index 5.23 HRUI  TSVR Index 25.55 HRUI  PVR SVR Ratio 0.11  TPVR/TSVR Ratio 0.2   Echo  10/07/2018: IMPRESSIONS    1. The left ventricle has severely reduced systolic function of 98-33%. The cavity size is normal. There is no increased left ventricular wall thickness. Echo evidence of restrictive diastolic relaxation Elevated mean left atrial pressure Left  ventricular diffuse hypokinesis.  2. The right ventricle has mildly reduced systolic function.  The cavity in mildly enlarged in size. There is no increase in right ventricular wall thickness. Right ventricular systolic pressure is severely elevated.  3. Moderately dilated left atrial size.  4. Mildly dilated right atrial size.  5. The tricuspid valve is normal in structure. Tricuspid valve regurgitation is moderate-severe.  6. The aortic valve has an indeterminant number of cusps. There is moderate calcification of the aortic valve. Severe AS (mean gradient 25 mmHg and AVA 0.69 cm2).  7. The inferior vena cava was dilated in size with >50% respiratory variability.  8. Possible small oscillating density on anterior MV annulus vs aortic valve; consider TEE if clinically indicated.  9. Severe LV dysfunction; restrictive filling; calcified aortic valve with severe AS; biatrial enlargement; mild MR; mild RVE with mild RV dysfunction; moderate to severe TR with severe pulmonary hypertension; cannot R/O oscillating density as described  above; consider TEE.  FINDINGS  Left Ventricle: The left ventricle has severely reduced systolic function of 82-50%. The cavity size is normal. There is no increased left ventricular wall thickness. Echo evidence of restrictive diastolic relaxation Elevated mean left atrial pressure  Left ventricular diffuse hypokinesis. There is akinesis of the entire inferolateral left ventricular segment. Right Ventricle: The right ventricle has mildly reduced systolic function. The cavity in mildly enlarged in size. There is no increase in right ventricular wall thickness. Right ventricular systolic pressure is severely elevated. Left Atrium: is moderately dilated Right Atrium: is mildly dilated Interatrial Septum: No atrial level shunt detected by color flow Doppler.  Pericardium: There is no evidence of pericardial effusion. Mitral Valve: The mitral valve is normal in structure There is mild thickening. There is mild mitral annular calcification present. Mitral valve  regurgitation is mild to moderate by color flow Doppler. Tricuspid Valve: The tricuspid valve is normal in structure. Tricuspid valve regurgitation is moderate-severe by color flow Doppler. Aortic Valve: The aortic valve has an indeterminant number of cusps. There is moderate calcification of the aortic valve. Aortic valve regurgitation was not visualized by color flow Doppler. The calculated aortic valve area is 0.69 cm, consistent with  severe stenosis. Pulmonic Valve: The pulmonic valve is normal in structure. Pulmonic valve regurgitation is not visualized by color flow Doppler. Venous: The inferior vena cava was dilated in size with greater than 50% respiratory variability.    EKG:  EKG is not ordered today.  Previous EKGs demonstrate normal sinus rhythm with right bundle branch block pattern.  Recent Labs: 10/05/2018: TSH 2.810 10/15/2018: ALT 13; BNP 222.3; BUN 37; Creatinine, Ser 1.12; Platelets 234 10/23/2018: Hemoglobin 13.3; Potassium 3.8; Sodium 141  Recent Lipid Panel    Component Value Date/Time   CHOL 193 01/14/2018 0212   TRIG 55 01/14/2018 0212   HDL 85 01/14/2018 0212   CHOLHDL 2.3 01/14/2018 0212   VLDL 11 01/14/2018 0212   LDLCALC 97 01/14/2018 0212    Physical Exam:    VS:  BP 104/62   Pulse 66   Ht 5' 7.5" (1.715 m)   Wt 146 lb 1.9 oz (66.3 kg)   SpO2 96%   BMI 22.55 kg/m     Wt Readings from Last 3 Encounters:  10/30/18 146  lb 1.9 oz (66.3 kg)  10/23/18 144 lb (65.3 kg)  10/15/18 143 lb (64.9 kg)     GEN:  Well nourished, well developed in no acute distress HEENT: Normal NECK: No JVD; No carotid bruits LYMPHATICS: No lymphadenopathy CARDIAC: RRR, 3/6 crescendo decrescendo murmur at the right upper sternal border, mid peaking, preserved A2 RESPIRATORY:  Clear to auscultation without rales, wheezing or rhonchi  ABDOMEN: Soft, non-tender, non-distended MUSCULOSKELETAL:  No edema; No deformity  SKIN: Warm and dry NEUROLOGIC:  Alert and oriented x  3 PSYCHIATRIC:  Normal affect   STS Risk Calculator: Risk of Mortality: 2.262% Renal Failure: 1.514% Permanent Stroke: 2.112% Prolonged Ventilation: 8.457% DSW Infection: 0.036% Reoperation: 4.104% Morbidity or Mortality: 13.056% Short Length of Stay: 32.692% Long Length of Stay: 6.667%  ASSESSMENT:    1. Severe aortic stenosis   2. Chronic systolic heart failure (HCC)    PLAN:    In order of problems listed above:  The patient presents with severe, stage D2 aortic stenosis (low-flow low gradient aortic stenosis with reduced LV function).  I have personally reviewed her echo and cardiac catheterization images.  Her echocardiogram demonstrates moderate to severe aortic valve calcification and restriction of leaflet mobility.  Peak and mean transvalvular gradients are 39 and 25 mmHg, respectively.  The patient's dimensionless index is 0.22 and calculated aortic valve area is 0.7 cm.  She has developed progressive symptoms of heart failure over the past year with continued decline in LV systolic function.  With an absence of high-grade obstructive coronary disease or other clear reasons for worsening LV function, I think it is likely that her aortic stenosis is contributing to both her heart failure and decline in LV function.  I have reviewed the natural history of aortic stenosis with the patient today. We have discussed the limitations of medical therapy and the poor prognosis associated with symptomatic aortic stenosis. We have reviewed potential treatment options, including palliative medical therapy, conventional surgical aortic valve replacement, and transcatheter aortic valve replacement. We discussed treatment options in the context of the patient's specific comorbid medical conditions.  She will be scheduled for CT angiography studies of the heart as well as the chest, abdomen, and pelvis.  Once her CT studies and carotid duplex scan are completed, she will be referred for cardiac surgical  consultation.  We had extensive discussion today about treatment options of TAVR versus conventional surgical AVR.  We reviewed specific issues around her particular case.  Her surgical risk is increased in the setting of severe cardiomyopathy and recent exacerbation of heart failure.  Her risk of permanent pacemaker with TAVR is increased because of baseline right bundle branch block.  Procedural animation's are demonstrated to her today.  All of her questions are answered.  She understands it we will review her case as a multidisciplinary heart valve team once all her studies are completed.   Medication Adjustments/Labs and Tests Ordered: Current medicines are reviewed at length with the patient today.  Concerns regarding medicines are outlined above.  No orders of the defined types were placed in this encounter.  No orders of the defined types were placed in this encounter.   There are no Patient Instructions on file for this visit.   Signed, Sherren Mocha, MD  10/30/2018 11:54 AM    [place]

## 2018-10-30 NOTE — Progress Notes (Signed)
Bilateral carotid duplex completed. Preliminary results in Chart review CV Proc. [facility] [place] [facility] 10/30/2018, 10:51 AM

## 2018-11-02 ENCOUNTER — Encounter: Payer: Self-pay | Admitting: Physical Therapy

## 2018-11-02 ENCOUNTER — Ambulatory Visit (HOSPITAL_COMMUNITY)
Admission: RE | Admit: 2018-11-02 | Discharge: 2018-11-02 | Disposition: A | Discharge status: Home / Self Care | Payer: PPO | Source: Ambulatory Visit | Attending: Cardiovascular Disease | Admitting: Cardiovascular Disease

## 2018-11-02 ENCOUNTER — Encounter (HOSPITAL_COMMUNITY): Payer: Self-pay

## 2018-11-02 ENCOUNTER — Ambulatory Visit: Payer: PPO | Attending: Cardiovascular Disease | Admitting: Physical Therapy

## 2018-11-02 ENCOUNTER — Other Ambulatory Visit: Payer: Self-pay

## 2018-11-02 DIAGNOSIS — Z8679 Personal history of other diseases of the circulatory system: Secondary | ICD-10-CM | POA: Diagnosis not present

## 2018-11-02 DIAGNOSIS — R293 Abnormal posture: Secondary | ICD-10-CM | POA: Insufficient documentation

## 2018-11-02 DIAGNOSIS — I350 Nonrheumatic aortic (valve) stenosis: Secondary | ICD-10-CM | POA: Diagnosis not present

## 2018-11-02 DIAGNOSIS — N289 Disorder of kidney and ureter, unspecified: Secondary | ICD-10-CM | POA: Diagnosis not present

## 2018-11-02 MED ORDER — IOPAMIDOL (ISOVUE-370) INJECTION 76%
80.0000 mL | Freq: Once | INTRAVENOUS | Status: AC | PRN
  Administered 2018-11-02: 50 mL via INTRAVENOUS

## 2018-11-02 MED ORDER — IOPAMIDOL (ISOVUE-370) INJECTION 76%
80.0000 mL | Freq: Once | INTRAVENOUS | Status: AC | PRN
  Administered 2018-11-02: 80 mL via INTRAVENOUS

## 2018-11-02 NOTE — Therapy (Addendum)
[place], [place], [address] Phone: [phone]   Fax:  [phone]  Physical Therapy Evaluation  Patient Details  Name: Brittany Shaffer MRN: [id number] Date of Birth: 23-Apr-1946 Referring Provider (PT): Sherren Mocha MD   Encounter Date: 11/02/2018  PT End of Session - 11/02/18 1319    Visit Number  1    Number of Visits  1    Date for PT Re-Evaluation  11/02/18    PT Start Time  7673    PT Stop Time  1350    PT Time Calculation (min)  31 min    Activity Tolerance  Patient tolerated treatment well    Behavior During Therapy  [facility] for tasks assessed/performed       Past Medical History:  Diagnosis Date  . Abnormality of gait 10/20/2014  . Arthritis    "was in my hips; still in my knees" (01/13/2018)  . DJD (degenerative joint disease), ankle and foot 10/20/2014  . Dyslipidemia 01/14/2018  . HAMMER TOE, ACQUIRED 07/28/2007   Qualifier: Diagnosis of  By: Lorelei Pont MD, Frederico Hamman    . Hamstring tightness of left lower extremity 03/15/2015   Persistent left hamstring tightness and pain for the last month. Her right mild scoliosis is likely contributing to her tightness. - Provided hamstring compression wrap. - Heel lifts placed on insoles to reduce hamstring stretch. Provided with thinner set as well. - Limit explosive activities such as competitive tennis for at least one week and slowly ease back into it.  - Strengthening and stretc  . Heart murmur   . Leg cramps   . Metatarsalgia of left foot 10/20/2014  . Mild coronary artery disease 01/14/2018   LAD with 50% stenosis on [facility] 12/2017  . Nonrheumatic aortic valve stenosis   . OA (osteoarthritis) of hip 03/24/2013  . Osteoarthritis of knee    Spurring and effusion noted on [place] on Left   . PLANTAR FASCIITIS 07/28/2007   Qualifier: Diagnosis of  By: Lorelei Pont MD, Frederico Hamman    . Unstable angina ([place]) 01/13/2018  . Varicose veins of bilateral lower extremities with other  complications 12/01/9377    Past Surgical History:  Procedure Laterality Date  . ABDOMINAL HYSTERECTOMY  03/03/2012   Procedure: HYSTERECTOMY ABDOMINAL;  Surgeon: Alvino Chapel, MD;  Location: [facility];  Service: Gynecology;  Laterality: N/A;  . BLEPHAROPLASTY Bilateral   . JOINT REPLACEMENT    . 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 RIGHT/LEFT HEART CATH AND CORONARY ANGIOGRAPHY N/A 01/14/2018   Procedure: RIGHT/LEFT HEART CATH AND CORONARY ANGIOGRAPHY;  Surgeon: Wellington Hampshire, MD;  Location: [facility];  Service: Cardiovascular;  Laterality: N/A;  . RIGHT/LEFT HEART CATH AND CORONARY ANGIOGRAPHY N/A 10/23/2018   Procedure: RIGHT/LEFT HEART CATH AND CORONARY ANGIOGRAPHY;  Surgeon: [name], [name], MD;  Location: [place] CV [facility];  Service: Cardiovascular;  Laterality: N/A;  . SALPINGOOPHORECTOMY  03/03/2012   Procedure: SALPINGO OOPHERECTOMY;  Surgeon: Alvino Chapel, MD;  Location: [facility];  Service: Gynecology;  Laterality: Bilateral;  . TONSILLECTOMY    . TOTAL HIP ARTHROPLASTY Left 03/24/2013   Procedure: LEFT TOTAL HIP ARTHROPLASTY ANTERIOR APPROACH;  Surgeon: [name], MD;  Location: [facility];  Service: Orthopedics;  Laterality: Left;  . TOTAL HIP ARTHROPLASTY Right 12/18/2016   Procedure: RIGHT TOTAL HIP ARTHROPLASTY ANTERIOR APPROACH;  Surgeon: Gaynelle Arabian, MD;  Location: [facility];  Service: Orthopedics;  Laterality: Right;  . TUBAL LIGATION      There were no vitals filed for this visit.   Subjective Assessment - 11/02/18 1317    Subjective  pt is a 73 y.o F with CC of SOB that has progressively worsened over the last 9 months. She reports significant improvement once she was put on the [place]. she reports having a hx or pain in the back of the L thigh with no specific onset/ cause.     Patient  Stated Goals  to get heart better    Currently in Pain?  No/denies    Pain Score  0-No pain         [facility] PT Assessment - 11/02/18 1316      Assessment   Medical Diagnosis  Severe Aortic Stenosis    Referring Provider (PT)  Sherren Mocha MD    Onset Date/Surgical Date  --   9 months   Hand Dominance  Right      Precautions   Precaution Comments  avoid playing tennis until this is treated      Restrictions   Weight Bearing Restrictions  No      Balance Screen   Has the patient fallen in the past 6 months  No    Has the patient had a decrease in activity level because of a fear of falling?   No    Is the patient reluctant to leave their home because of a fear of falling?   No      Home Environment   Living Environment  Private residence    Living Arrangements  Alone    Type of [place] Access  Level entry    [place]  One level      Prior Function   Level of Independence  Independent      Posture/Postural Control   Posture/Postural Control  Postural limitations    Postural Limitations  Rounded Shoulders;Forward head      ROM / Strength   AROM / PROM / Strength  AROM;Strength      AROM   Overall AROM   Within functional limits for tasks performed      Strength   Overall Strength  Within functional limits for tasks performed    Right Hand Grip (lbs)  53    Left Hand Grip (lbs)  57       [facility] Pre-Surgical Assessment - 11/02/18 0001    5 Meter Walk Test- trial 1  2 sec    5 Meter Walk Test- trial 2  3 sec.     5 Meter Walk Test- trial 3  2 sec.    5 meter walk test average  2.33 sec    4 Stage Balance Test tolerated for:   2 sec.    4 Stage Balance Test Position  4    Sit To Stand Test- trial 1  12 sec.    ADL/IADL Independent with:  Bathing;Dressing;Meal prep;Finances    ADL/IADL Needs Assistance with:  [place] work    ADL/IADL Fraility Index  Midly frail    6 Minute Walk- Baseline  yes    BP (mmHg)  133/85    HR (bpm)  68    02 Sat (%RA)  98  %    Modified Borg Scale for Dyspnea  0- Nothing at all    Perceived Rate of Exertion (Borg)  6-  6 Minute Walk Post Test  yes    BP (mmHg)  155/88    HR (bpm)  119    02 Sat (%RA)  88 %    Modified Borg Scale for Dyspnea  0.5- Very, very slight shortness of breath    Perceived Rate of Exertion (Borg)  6-    Aerobic Endurance Distance Walked  1890    Endurance additional comments  pt demonstrated (-) 22.33 % limitation compared to age related norm   norm for 70-79 is 1545 ft in 6 min             Objective measurements completed on examination: See above findings.                           Plan - 11/02/18 1352    Clinical Impression Statement  see assessment in note    Stability/Clinical Decision Making  Stable/Uncomplicated    Clinical Decision Making  Low    PT Frequency  One time visit    PT Next Visit Plan  Pre-TAVR evaluation    Consulted and Agree with Plan of Care  Patient      Clinical Impression Statement: Pt is a 73 yo F presenting to OP PT for evaluation prior to possible TAVR surgery due to severe aortic stenosis. Pt reports onset of SOB approximately 9 months ago. Symptoms are limiting endurance with prolonged walking/ standing and tennis. Pt presents with good ROM and strength, good balance and is assessed at low at high fall risk 4 stage balance test, excellent walking speed and excellent aerobic endurance per 6 minute walk test. Pt ambulated 1890 feet in 6 min walk test without requiring a rest break.At the end of the test patient's HR was 119  bpm and O2 was 88% on room air. Pt reported 0.5/10 shortness of breath on modified scale for dyspnea. SOB increased moderately with 6 minute walk test. Based on the Short Physical Performance Battery, patient has a frailty rating of 11/12 with </= 5/12 considered frail.    Patient demonstrated the following deficits and impairments:     Visit Diagnosis: Abnormal posture - Plan: PT plan of care  cert/re-cert     Problem List Patient Active Problem List   Diagnosis Date Noted  . Acute on chronic systolic (congestive) heart failure ([place]) 10/23/2018  . Dyslipidemia 01/14/2018  . Mild coronary artery disease 01/14/2018  . Nonrheumatic aortic valve stenosis   . Unstable angina ([place]) 01/13/2018  . Varicose veins of bilateral lower extremities with other complications 87/86/7672  . Hamstring tightness of left lower extremity 03/15/2015  . Metatarsalgia of left foot 10/20/2014  . Abnormality of gait 10/20/2014  . DJD (degenerative joint disease), ankle and foot 10/20/2014  . Postoperative anemia due to acute blood loss 03/25/2013  . OA (osteoarthritis) of hip 03/24/2013  . Osteoarthritis of knee   . PLANTAR FASCIITIS 07/28/2007  . HAMMER TOE, ACQUIRED 07/28/2007   [place] PT, DPT, LAT, ATC  11/02/18  3:01 PM      [place] [facility] [address] [place], [place], [address] Phone: [phone]   Fax:  [phone]  Name: AHLIVIA SALAHUDDIN MRN: [id number] Date of Birth: March 09, 1946

## 2018-11-04 ENCOUNTER — Encounter: Payer: PPO | Admitting: Thoracic Surgery (Cardiothoracic Vascular Surgery)

## 2018-11-05 ENCOUNTER — Institutional Professional Consult (permissible substitution): Payer: PPO | Admitting: Thoracic Surgery (Cardiothoracic Vascular Surgery)

## 2018-11-05 ENCOUNTER — Encounter: Payer: Self-pay | Admitting: Thoracic Surgery (Cardiothoracic Vascular Surgery)

## 2018-11-05 ENCOUNTER — Other Ambulatory Visit: Payer: Self-pay

## 2018-11-05 VITALS — BP 120/80 | HR 70 | Resp 20 | Ht 67.5 in | Wt 147.0 lb

## 2018-11-05 DIAGNOSIS — Q231 Congenital insufficiency of aortic valve: Secondary | ICD-10-CM | POA: Diagnosis not present

## 2018-11-05 DIAGNOSIS — Q2381 Bicuspid aortic valve: Secondary | ICD-10-CM | POA: Insufficient documentation

## 2018-11-05 DIAGNOSIS — I350 Nonrheumatic aortic (valve) stenosis: Secondary | ICD-10-CM | POA: Diagnosis not present

## 2018-11-05 DIAGNOSIS — I340 Nonrheumatic mitral (valve) insufficiency: Secondary | ICD-10-CM | POA: Diagnosis not present

## 2018-11-05 NOTE — H&P (View-Only) (Signed)
[facility] CONSULTATION REPORT  Referring Provider is [name], [name], MD Primary Cardiologist is No primary care provider on file. PCP is [name], MD  Chief Complaint  Patient presents with  . Aortic Stenosis    Surgical eval for TAVR, review all studies    HPI:  Patient is a 73 year old female with bicuspid aortic valve with aortic stenosis, mitral regurgitation, chronic combined systolic and diastolic congestive heart failure, RBBB, hyperlipidemia, and degenerative arthritis who has been referred for surgical consultation to discuss treatment options for management of aortic stenosis.  Patient states that she has been told that she had a heart murmur since her youth.  She was evaluated when she went to college and told that there might of been question that she had had rheumatic fever as a child.  She has never been seen by a cardiologist until recently.  In May 2019 she developed new onset of symptoms of exertional shortness of breath and chest tightness.  Cardiac troponins were negative and EKG revealed baseline chronic right bundle branch block without acute ST or T wave changes.  She underwent a transthoracic echocardiogram that revealed normal left ventricular function with ejection fraction estimated 50 to 55%, moderate aortic stenosis, and mild mitral regurgitation.  The left atrium was severely dilated..  Peak velocity across the aortic valve was reported 2.9 m/s corresponding to mean transvalvular gradient estimated 21 mmHg and aortic valve area calculated 0.75 cm.  She was hospitalized and evaluated by Dr. [name].  She underwent diagnostic cardiac catheterization by Dr. [name].  She was noted to have mild coronary artery disease with 50% stenosis of the distal left anterior descending coronary artery.  Mean transvalvular gradient across the aortic valve was measured 7 mmHg corresponding to  aortic valve area calculated 2.7 cm.  She was discharged home and she states that she was told that no further cardiac follow-up was necessary and she was to follow-up with her primary care physician.  Since that time the patient has continued to complain of exertional shortness of breath.  Previously the patient had remained remarkably active physically.  She enjoys playing tennis and golf and she walks on a regular basis.  She developed progressive symptoms of exertional shortness of breath, orthopnea, and lower extremity edema.  She was evaluated by pulmonologist and found to have no evidence of significant lung disease.  CT scan of the chest revealed mild pulmonary edema.  She asked her primary care physician to refer her back for cardiology consultation and she was evaluated by Dr. [name] October 05, 2018.  Follow-up echocardiogram performed October 07, 2018 revealed significant drop in left ventricular systolic function with ejection fraction estimated only 25 to 30%.  There was felt to be low flow, low ejection fraction, severe aortic stenosis.  Peak velocity across the aortic valve was measured greater than 3.1 m/s corresponding to mean transvalvular gradient estimated 25 mmHg.  The DVI was reported 0.22 with aortic valve area calculated 0.69 cm.  There was reportedly mild to moderate mitral regurgitation although regurgitant fraction and ERO were not reported.  She subsequently underwent repeat diagnostic cardiac catheterization on October 23, 2018.  This confirmed the presence of mild nonobstructive coronary artery disease.  There was felt to be moderate left ventricular systolic dysfunction with ejection fraction estimated 35 to 45%.  Mean transvalvular gradient across the aortic valve was measured 27 mmHg.  Right heart pressures were normal.  The patient  was referred to the multidisciplinary heart valve clinic and has been evaluated previously by Dr. [name].  CT angiography was performed and  surgical consultation requested.  Patient is recently widowed, having lost her husband in January 2019.  She lives alone locally in [place].  She has 2 adult children including 1 son who lives locally in [place].  She has been retired for many years.  She has been quite active physically throughout retirement.  She enjoys playing tennis and golf.  She walks on a regular basis.  She has had some issues with degenerative arthritis and has previously undergone bilateral hip replacement.  However, she remains quite active physically and has no significant physical limitations other than that of symptoms of congestive heart failure first began experience of exertional breath nearly 1 year ago.  Symptoms progressed until she was evaluated by Dr. [name] approximately 1 month ago.  By that time she was getting short of breath with moderate level activity and having trouble sleeping at night due to orthopnea.  She also had some lower extremity edema.  At that time she was started on oral diuretic and Entresto, and symptoms have improved.  However, she still gets short of breath with exertion.  She has never had any exertional chest pain or chest tightness.  She had some dizzy spells when she was for started on Entresto but this resolved when her dose was adjusted.  She has never had any syncope.  Past Medical History:  Diagnosis Date  . Abnormality of gait 10/20/2014  . Arthritis    "was in my hips; still in my knees" (01/13/2018)  . Bicuspid aortic valve   . DJD (degenerative joint disease), ankle and foot 10/20/2014  . Dyslipidemia 01/14/2018  . HAMMER TOE, ACQUIRED 07/28/2007   Qualifier: Diagnosis of  By: Lorelei Pont MD, Frederico Hamman    . Hamstring tightness of left lower extremity 03/15/2015   Persistent left hamstring tightness and pain for the last month. Her right mild scoliosis is likely contributing to her tightness. - Provided hamstring compression wrap. - Heel lifts placed on insoles to reduce hamstring  stretch. Provided with thinner set as well. - Limit explosive activities such as competitive tennis for at least one week and slowly ease back into it.  - Strengthening and stretc  . Heart murmur   . Leg cramps   . Metatarsalgia of left foot 10/20/2014  . Mild coronary artery disease 01/14/2018   LAD with 50% stenosis on [facility] 12/2017  . Mitral regurgitation   . Nonrheumatic aortic valve stenosis   . OA (osteoarthritis) of hip 03/24/2013  . Osteoarthritis of knee    Spurring and effusion noted on [place] on Left   . PLANTAR FASCIITIS 07/28/2007   Qualifier: Diagnosis of  By: Lorelei Pont MD, Frederico Hamman    . Unstable angina ([place]) 01/13/2018  . Varicose veins of bilateral lower extremities with other complications 09/08/735    Past Surgical History:  Procedure Laterality Date  . ABDOMINAL HYSTERECTOMY  03/03/2012   Procedure: HYSTERECTOMY ABDOMINAL;  Surgeon: Alvino Chapel, MD;  Location: [facility];  Service: Gynecology;  Laterality: N/A;  . BLEPHAROPLASTY Bilateral   . JOINT REPLACEMENT    . 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 RIGHT/LEFT HEART CATH AND CORONARY ANGIOGRAPHY N/A 01/14/2018   Procedure: RIGHT/LEFT HEART CATH AND CORONARY ANGIOGRAPHY;  Surgeon: Wellington Hampshire, MD;  Location: [facility];  Service: Cardiovascular;  Laterality: N/A;  . RIGHT/LEFT HEART CATH AND CORONARY ANGIOGRAPHY N/A 10/23/2018   Procedure: RIGHT/LEFT HEART CATH AND CORONARY ANGIOGRAPHY;  Surgeon: [name], [name], MD;  Location: [facility];  Service: Cardiovascular;  Laterality: N/A;  . SALPINGOOPHORECTOMY  03/03/2012   Procedure: SALPINGO OOPHERECTOMY;  Surgeon: Alvino Chapel, MD;  Location: [facility];  Service: Gynecology;  Laterality: Bilateral;  . TONSILLECTOMY    . TOTAL HIP ARTHROPLASTY Left 03/24/2013   Procedure: LEFT TOTAL HIP ARTHROPLASTY  ANTERIOR APPROACH;  Surgeon: [name], MD;  Location: [facility];  Service: Orthopedics;  Laterality: Left;  . TOTAL HIP ARTHROPLASTY Right 12/18/2016   Procedure: RIGHT TOTAL HIP ARTHROPLASTY ANTERIOR APPROACH;  Surgeon: Gaynelle Arabian, MD;  Location: [facility];  Service: Orthopedics;  Laterality: Right;  . TUBAL LIGATION      Family History  Problem Relation Age of Onset  . Hypertension Mother   . Bone cancer Father   . Lung cancer Father   . Ovarian cancer Sister   . Diabetes Maternal Aunt   . Breast cancer Maternal Aunt        Age 67's    Social History   Socioeconomic History  . Marital status: Widowed    Spouse name: Not on file  . Number of children: Not on file  . Years of education: Not on file  . Highest education level: Not on file  Occupational History  . Not on file  Social Needs  . Financial resource strain: Not on file  . Food insecurity:    Worry: Not on file    Inability: Not on file  . Transportation needs:    Medical: Not on file    Non-medical: Not on file  Tobacco Use  . Smoking status: Never Smoker  . Smokeless tobacco: Never Used  Substance and Sexual Activity  . Alcohol use: Yes    Alcohol/week: 7.0 standard drinks    Types: 7 Standard drinks or equivalent per week    Comment: 1 / PER DAY  . Drug use: No  . Sexual activity: Not Currently    Birth control/protection: Surgical    Comment: 1st intercourse 73 yo-Fewer than 5 partners  Lifestyle  . Physical activity:    Days per week: Not on file    Minutes per session: Not on file  . Stress: Not on file  Relationships  . Social connections:    Talks on phone: Not on file    Gets together: Not on file    Attends religious service: Not on file    Active member of club or organization: Not on file    Attends meetings of clubs or organizations: Not on file    Relationship status: Not on file  . Intimate partner violence:    Fear of current or ex partner: Not on file    Emotionally abused:  Not on file    Physically abused: Not on file    Forced sexual activity: Not on file  Other Topics Concern  . Not on file  Social History Narrative   Lives locally.  Retired. Active - walks frequently and plays tennis regularly.    Current Outpatient Medications  Medication Sig Dispense Refill  . acetaminophen (TYLENOL) 500 MG tablet Take 500-1,000 mg by mouth every 6 (six) hours as needed for moderate pain.    [name] aspirin EC 81 MG tablet Take 81 mg by mouth daily.    . diclofenac  sodium (VOLTAREN) 1 % GEL Apply 1 application topically 4 (four) times daily as needed (pain).    [name] estradiol (CLIMARA - DOSED IN MG/24 HR) 0.025 mg/24hr patch PLACE 1 PATCH (0.025 MG TOTAL) ONTO THE SKIN ONCE A WEEK. 4 patch 12  . etodolac (LODINE) 500 MG tablet Take 1 tablet (500 mg total) by mouth daily. 30 tablet 6  . famotidine (PEPCID AC) 10 MG tablet Take 10 mg by mouth daily as needed for heartburn or indigestion.    . furosemide (LASIX) 20 MG tablet Take 1 tablet (20 mg total) by mouth daily. 90 tablet 3  . Menthol-Methyl Salicylate (MUSCLE RUB EX) Apply 1 application topically daily as needed (muscle pain).    . potassium chloride SA (K-DUR,KLOR-CON) 20 MEQ tablet Take 1 tablet (20 mEq total) by mouth daily. 90 tablet 3  . sacubitril-valsartan (ENTRESTO) 24-26 MG Take 0.5 tablets by mouth 2 (two) times daily.  60 tablet   . UNABLE TO FIND Hyland's Leg Cramps tablets: Take 1 tablet by mouth during the night as needed for leg cramps    . zolpidem (AMBIEN) 10 MG tablet Take 1 tablet (10 mg total) by mouth at bedtime as needed. For sleep 30 tablet 1   No current facility-administered medications for this visit.     Allergies  Allergen Reactions  . Codeine Other (See Comments)    Hallucinations      Review of Systems:   General:  normal appetite, decreased energy, no weight gain, no weight loss, no fever  Cardiac:  no chest pain with exertion, no chest pain at rest, +SOB with exertion, no resting  SOB, no PND, + orthopnea, no palpitations, no arrhythmia, no atrial fibrillation, + LE edema, no dizzy spells, no syncope  Respiratory:  + shortness of breath, no home oxygen, no productive cough, no dry cough, no bronchitis, no wheezing, no hemoptysis, no asthma, no pain with inspiration or cough, no sleep apnea, no CPAP at night  GI:   no difficulty swallowing, no reflux, no frequent heartburn, no hiatal hernia, no abdominal pain, no constipation, no diarrhea, no hematochezia, no hematemesis, no melena  GU:   no dysuria,  no frequency, no urinary tract infection, no hematuria, no kidney stones, no kidney disease  Vascular:  no pain suggestive of claudication, no pain in feet, + leg cramps, + varicose veins, no DVT, no non-healing foot ulcer  Neuro:   no stroke, no TIA's, no seizures, no headaches, no temporary blindness one eye,  no slurred speech, no peripheral neuropathy, no chronic pain, no instability of gait, no memory/cognitive dysfunction  Musculoskeletal: + arthritis, no joint swelling, no myalgias, no difficulty walking, normal mobility   Skin:   no rash, no itching, no skin infections, no pressure sores or ulcerations  Psych:   no anxiety, no depression, no nervousness, + unusual recent stress  Eyes:   no blurry vision, no floaters, no recent vision changes, + wears glasses for reading  ENT:   no hearing loss, no loose or painful teeth, no dentures, last saw dentist January 2020  Hematologic:  no easy bruising, no abnormal bleeding, no clotting disorder, no frequent epistaxis  Endocrine:  no diabetes, does not check CBG's at home           Physical Exam:   BP 120/80   Pulse 70   Resp 20   Ht 5' 7.5" (1.715 m)   Wt 147 lb (66.7 kg)   SpO2 99%   BMI 22.68 kg/m  General:  Thin,  well-appearing  HEENT:  Unremarkable   Neck:   no JVD, no bruits, no adenopathy   Chest:   clear to auscultation, symmetrical breath sounds, no wheezes, no rhonchi   CV:   RRR, grade II/VI  crescendo/decrescendo murmur heard best at RSB, more prominent holosystolic murmur at apex, no diastolic murmur  Abdomen:  soft, non-tender, no masses   Extremities:  warm, well-perfused, pulses palpable, no LE edema  Rectal/GU  Deferred  Neuro:   Grossly non-focal and symmetrical throughout  Skin:   Clean and dry, no rashes, no breakdown   Diagnostic Tests:  Transthoracic Echocardiography  Patient:    Nanda, Bittick MR #:       [id number] Study Date: 01/14/2018 Gender:     F Age:        76 Height:     171.5 cm Weight:     68.1 kg BSA:        1.8 m^2 Pt. Status: Room:   ADMITTING    Rozann Lesches, M.D.  ATTENDING    Rozann Lesches, M.D.  ORDERING     Murray Hodgkins, MD  REFERRING    Murray Hodgkins, MD  PERFORMING   [facility], Inpatient  SONOGRAPHER  Mikki Santee  cc:  ------------------------------------------------------------------- LV EF: 50% -   55%  ------------------------------------------------------------------- Indications:      Murmur 785.2.  ------------------------------------------------------------------- History:   PMH:   Unstable angina.  ------------------------------------------------------------------- Study Conclusions  - Left ventricle: The cavity size was normal. Systolic function was   normal. The estimated ejection fraction was in the range of 50%   to 55%. Wall motion was normal; there were no regional wall   motion abnormalities. The study is not technically sufficient to   allow evaluation of LV diastolic function. - Aortic valve: Severe diffuse calcification involving the right   coronary and noncoronary cusp. There was moderate stenosis. Mean   gradient (S): 21 mm Hg. Valve area (VTI): 0.75 cm^2. Valve area   (Vmax): 0.84 cm^2. Valve area (Vmean): 0.8 cm^2. - Mitral valve: Mild diffuse calcification of the anterior leaflet.   There was mild regurgitation. - Left atrium: The atrium was severely dilated. - Right  ventricle: The cavity size was mildly dilated. Wall   thickness was normal. - Pulmonary arteries: PA peak pressure: 36 mm Hg (S). - Inferior vena cava: The vessel was mildly dilated.  Impressions:  - The right ventricular systolic pressure was increased consistent   with mild pulmonary hypertension.  ------------------------------------------------------------------- Study data:  No prior study was available for comparison.  Study status:  Routine.  Procedure:  The patient reported no pain pre or post test. Transthoracic echocardiography. Image quality was adequate.  Study completion:  There were no complications. Transthoracic echocardiography.  M-mode, complete 2D, spectral Doppler, and color Doppler.  Birthdate:  Patient birthdate: 04/19/1946.  Age:  Patient is 73 yr old.  Sex:  Gender: female. BMI: 23.1 kg/m^2.  Blood pressure:     136/71  Patient status: Inpatient.  Study date:  Study date: 01/14/2018. Study time: 09:59 AM.  [facility]:  [facility].  -------------------------------------------------------------------  ------------------------------------------------------------------- Left ventricle:  The cavity size was normal. Systolic function was normal. The estimated ejection fraction was in the range of 50% to 55%. Wall motion was normal; there were no regional wall motion abnormalities. The study is not technically sufficient to allow evaluation of LV diastolic function.  ------------------------------------------------------------------- Aortic valve:   Trileaflet.  Severe diffuse calcification involving the right coronary and  noncoronary cusp. Mobility was not restricted.  Doppler:   There was moderate stenosis.   There was no regurgitation.    VTI ratio of LVOT to aortic valve: 0.24. Valve area (VTI): 0.75 cm^2. Indexed valve area (VTI): 0.42 cm^2/m^2. Peak velocity ratio of LVOT to aortic valve: 0.27. Valve area (Vmax): 0.84 cm^2. Indexed valve area  (Vmax): 0.47 cm^2/m^2. Mean velocity ratio of LVOT to aortic valve: 0.25. Valve area (Vmean): 0.8 cm^2. Indexed valve area (Vmean): 0.44 cm^2/m^2.    Mean gradient (S): 21 mm Hg. Peak gradient (S): 34 mm Hg.  ------------------------------------------------------------------- Aorta:  Aortic root: The aortic root was normal in size.  ------------------------------------------------------------------- Mitral valve:   Mild diffuse calcification of the anterior leaflet. Mobility was not restricted.  Doppler:  Transvalvular velocity was within the normal range. There was no evidence for stenosis. There was mild regurgitation.    Peak gradient (D): 4 mm Hg.  ------------------------------------------------------------------- Left atrium:  The atrium was severely dilated.  ------------------------------------------------------------------- Right ventricle:  The cavity size was mildly dilated. Wall thickness was normal. Systolic function was normal.  ------------------------------------------------------------------- Pulmonic valve:    Structurally normal valve.   Cusp separation was normal.  Doppler:  Transvalvular velocity was within the normal range. There was no evidence for stenosis. There was no regurgitation.  ------------------------------------------------------------------- Tricuspid valve:   Structurally normal valve.    Doppler: Transvalvular velocity was within the normal range. There was mild regurgitation.  ------------------------------------------------------------------- Pulmonary artery:   The main pulmonary artery was normal-sized. Systolic pressure was within the normal range.  ------------------------------------------------------------------- Right atrium:  The atrium was normal in size.  ------------------------------------------------------------------- Pericardium:  There was no pericardial  effusion.  ------------------------------------------------------------------- Systemic veins: Inferior vena cava: The vessel was mildly dilated.  ------------------------------------------------------------------- Measurements   Left ventricle                           Value          Reference  LV ID, ED, PLAX chordal                  50    mm       43 - 52  LV ID, ES, PLAX chordal                  37    mm       23 - 38  LV fx shortening, PLAX chordal   (L)     26    %        >=29  LV PW thickness, ED                      11    mm       ----------  IVS/LV PW ratio, ED                      0.82           <=1.3  Stroke volume, 2D                        58    ml       ----------  Stroke volume/bsa, 2D                    32    ml/m^2   ----------  LV e&', medial  2.51  cm/s     ----------  LV E/e&', medial                          37.29          ----------    Ventricular septum                       Value          Reference  IVS thickness, ED                        9     mm       ----------    LVOT                                     Value          Reference  LVOT ID, S                               20    mm       ----------  LVOT area                                3.14  cm^2     ----------  LVOT peak velocity, S                    77.4  cm/s     ----------  LVOT mean velocity, S                    53.9  cm/s     ----------  LVOT VTI, S                              18.6  cm       ----------    Aortic valve                             Value          Reference  Aortic valve peak velocity, S            291   cm/s     ----------  Aortic valve mean velocity, S            212   cm/s     ----------  Aortic valve VTI, S                      77.8  cm       ----------  Aortic mean gradient, S                  21    mm Hg    ----------  Aortic peak gradient, S                  34    mm Hg    ----------  VTI ratio, LVOT/AV                       0.24            ----------  Aortic valve area, VTI                   0.75  cm^2     ----------  Aortic valve area/bsa, VTI               0.42  cm^2/m^2 ----------  Velocity ratio, peak, LVOT/AV            0.27           ----------  Aortic valve area, peak velocity         0.84  cm^2     ----------  Aortic valve area/bsa, peak              0.47  cm^2/m^2 ----------  velocity  Velocity ratio, mean, LVOT/AV            0.25           ----------  Aortic valve area, mean velocity         0.8   cm^2     ----------  Aortic valve area/bsa, mean              0.44  cm^2/m^2 ----------  velocity    Aorta                                    Value          Reference  Aortic root ID, ED                       28    mm       ----------    Left atrium                              Value          Reference  LA ID, A-P, ES                           37    mm       ----------  LA ID/bsa, A-P                           2.05  cm/m^2   <=2.2  LA volume, S                             89.3  ml       ----------  LA volume/bsa, S                         49.5  ml/m^2   ----------  LA volume, ES, 1-p A4C                   70.5  ml       ----------  LA volume/bsa, ES, 1-p A4C               39.1  ml/m^2   ----------  LA volume, ES, 1-p A2C                   102   ml       ----------  LA volume/bsa, ES, 1-p A2C  56.5  ml/m^2   ----------    Mitral valve                             Value          Reference  Mitral E-wave peak velocity              93.6  cm/s     ----------  Mitral A-wave peak velocity              37.8  cm/s     ----------  Mitral deceleration time         (H)     243   ms       150 - 230  Mitral peak gradient, D                  4     mm Hg    ----------  Mitral E/A ratio, peak                   2.5            ----------    Pulmonary arteries                       Value          Reference  PA pressure, S, DP               (H)     36    mm Hg    <=30    Tricuspid valve                          Value           Reference  Tricuspid regurg peak velocity           265   cm/s     ----------  Tricuspid peak RV-RA gradient            28    mm Hg    ----------    Right atrium                             Value          Reference  RA ID, S-I, ES, A4C              (H)     59    mm       34 - 49  RA area, ES, A4C                 (H)     21.6  cm^2     8.3 - 19.5  RA volume, ES, A/L                       63.2  ml       ----------  RA volume/bsa, ES, A/L                   35    ml/m^2   ----------    Systemic veins                           Value          Reference  Estimated CVP  8     mm Hg    ----------    Right ventricle                          Value          Reference  TAPSE                                    26    mm       ----------  RV pressure, S, DP               (H)     36    mm Hg    <=30  RV s&', lateral, S                        11.1  cm/s     ----------  Legend: (L)  and  (H)  mark values outside specified reference range.  ------------------------------------------------------------------- Prepared and Electronically Authenticated by  Fransico Him, MD 2019-05-15T11:20:14   TRANSTHORACIC ECHOCARDIOGRAM REPORT     Patient Name:   JOYCEANN KRUSER  Date of Exam: 10/07/2018 Medical Rec #:  [id number]     Height:       67.5 in Accession #:    [id number]    Weight:       152.2 lb Date of Birth:  05/02/1946     BSA:          1.81 m Patient Age:    51 years      BP:           118/76 mmHg Patient Gender: F             HR:           77 bpm. Exam Location:  [place]    Procedure: 2D Echo, Cardiac Doppler and Color Doppler  Indications:    R06.00 Dyspnea   History:        Patient has prior history of Echocardiogram examinations, most                 recent 01/14/2018.   Sonographer:    Wilford Sports Rodgers-Jones RDCS Referring Phys: [id number] [name] [name]  IMPRESSIONS    1. The left ventricle has severely reduced systolic function of 86-57%. The  cavity size is normal. There is no increased left ventricular wall thickness. Echo evidence of restrictive diastolic relaxation Elevated mean left atrial pressure Left  ventricular diffuse hypokinesis.  2. The right ventricle has mildly reduced systolic function. The cavity in mildly enlarged in size. There is no increase in right ventricular wall thickness. Right ventricular systolic pressure is severely elevated.  3. Moderately dilated left atrial size.  4. Mildly dilated right atrial size.  5. The tricuspid valve is normal in structure. Tricuspid valve regurgitation is moderate-severe.  6. The aortic valve has an indeterminant number of cusps. There is moderate calcification of the aortic valve. Severe AS (mean gradient 25 mmHg and AVA 0.69 cm2).  7. The inferior vena cava was dilated in size with >50% respiratory variability.  8. Possible small oscillating density on anterior MV annulus vs aortic valve; consider TEE if clinically indicated.  9. Severe LV dysfunction; restrictive filling; calcified aortic valve with severe AS; biatrial enlargement; mild MR; mild RVE with mild RV dysfunction; moderate to severe TR with severe pulmonary  hypertension; cannot R/O oscillating density as described  above; consider TEE.  FINDINGS  Left Ventricle: The left ventricle has severely reduced systolic function of 20-25%. The cavity size is normal. There is no increased left ventricular wall thickness. Echo evidence of restrictive diastolic relaxation Elevated mean left atrial pressure  Left ventricular diffuse hypokinesis. There is akinesis of the entire inferolateral left ventricular segment. Right Ventricle: The right ventricle has mildly reduced systolic function. The cavity in mildly enlarged in size. There is no increase in right ventricular wall thickness. Right ventricular systolic pressure is severely elevated. Left Atrium: is moderately dilated Right Atrium: is mildly dilated Interatrial Septum: No  atrial level shunt detected by color flow Doppler.  Pericardium: There is no evidence of pericardial effusion. Mitral Valve: The mitral valve is normal in structure There is mild thickening. There is mild mitral annular calcification present. Mitral valve regurgitation is mild to moderate by color flow Doppler. Tricuspid Valve: The tricuspid valve is normal in structure. Tricuspid valve regurgitation is moderate-severe by color flow Doppler. Aortic Valve: The aortic valve has an indeterminant number of cusps. There is moderate calcification of the aortic valve. Aortic valve regurgitation was not visualized by color flow Doppler. The calculated aortic valve area is 0.69 cm, consistent with  severe stenosis. Pulmonic Valve: The pulmonic valve is normal in structure. Pulmonic valve regurgitation is not visualized by color flow Doppler. Venous: The inferior vena cava was dilated in size with greater than 50% respiratory variability.   LEFT VENTRICLE PLAX 2D (Teich) LV EF:          36.5 %   Diastology LVIDd:          5.10 cm  LV e' lateral:   5.44 cm/s LVIDs:          4.20 cm  LV E/e' lateral: 19.7 LV PW:          0.90 cm  LV e' medial:    3.92 cm/s LV IVS:         1.10 cm  LV E/e' medial:  27.3 LVOT diam:      2.00 cm LV SV:          45 ml LVOT Area:      3.14 cm  RIGHT VENTRICLE RV Basal diam:  4.70 cm RV S prime:     10.30 cm/s TAPSE (M-mode): 2.0 cm  LEFT ATRIUM             Index       RIGHT ATRIUM           Index LA diam:        5.10 cm 2.82 cm/m  RA Area:     19.60 cm LA Vol (A2C):   56.7 ml 31.32 ml/m RA Volume:   53.70 ml  29.66 ml/m LA Vol (A4C):   64.3 ml 35.51 ml/m LA Biplane Vol: 63.0 ml 34.80 ml/m  AORTIC VALVE AV Area (Vmax):    0.68 cm AV Area (Vmean):   0.65 cm AV Area (VTI):     0.69 cm AV Vmax:           312.40 cm/s AV Vmean:          237.200 cm/s AV VTI:            0.617 m AV Peak Grad:      39.0 mmHg AV Mean Grad:      25.0 mmHg LVOT Vmax:          67.20 cm/s  LVOT Vmean:        49.300 cm/s LVOT VTI:          0.136 m LVOT/AV VTI ratio: 0.22   AORTA Ao Root diam: 3.00 cm  MITRAL VALVE               TR Peak grad: 64.6 mmHg                            TR Vmax:      417.00 cm/s   MV Decel Time: 158 msec MR Peak grad: 97.2 mmHg MR Mean grad: 64.0 mmHg MR Vmax:      493.00 cm/s MR Vmean:     378.0 cm/s MV E velocity: 107.00 cm/s MV A velocity: 36.10 cm/s MV E/A ratio:  2.96    Kirk Ruths MD Electronically signed by Kirk Ruths MD Signature Date/Time: 10/07/2018/5:19:59 PM    RIGHT/LEFT HEART CATH AND CORONARY ANGIOGRAPHY  Conclusion     Dist LAD lesion is 30% stenosed.  There is moderate left ventricular systolic dysfunction.  The left ventricular ejection fraction is 35-45% by visual estimate.  LV end diastolic pressure is normal.  There is moderate aortic valve stenosis.   1. Minimal nonobstructive CAD 2. Moderate LV dysfunction. EF 35-40%. 3. At least moderate AS. Mean gradient 27 mm Hg. AVA may be underestimated due to decreased LV function 4. Normal LV filling pressures. 5. Normal right heart pressures.  Plan: will refer to Valvular heart team for consideration of TAVR.      Recommendations   Antiplatelet/Anticoag Recommend Aspirin 81mg  daily for moderate CAD.  Indications   Nonrheumatic aortic valve stenosis [I35.0 (ICD-10-CM)]  Procedural Details   Technical Details Indication: 73 yo WF with progressive aortic stenosis, worsening CHF and decrease in LV function.  Procedural Details: The right wrist was prepped, draped, and anesthetized with 1% lidocaine. Using the modified Seldinger technique a 6 Fr slender sheath was placed in the right radial artery and a 5 French sheath was placed in the right brachial vein. A Swan-Ganz catheter was used for the right heart catheterization. Standard protocol was followed for recording of right heart pressures and sampling of oxygen saturations. Fick  cardiac output was calculated. Standard Judkins catheters were used for selective coronary angiography and left ventriculography. There were no immediate procedural complications. The patient was transferred to the post catheterization recovery area for further monitoring. Contrast: 80 cc Estimated blood loss <50 mL.   During this procedure medications were administered to achieve and maintain moderate conscious sedation while the patient's heart rate, blood pressure, and oxygen saturation were continuously monitored and I was present face-to-face 100% of this time.  Medications  (Filter: Administrations occurring from 10/23/18 0720 to 10/23/18 0809)  Medication Rate/Dose/Volume Action  Date Time   midazolam (VERSED) injection (mg) 1 mg Given 10/23/18 0732   Total dose as of 11/05/18 1350        1 mg        fentaNYL (SUBLIMAZE) injection (mcg) 25 mcg Given 10/23/18 0733   Total dose as of 11/05/18 1350        25 mcg        lidocaine (PF) (XYLOCAINE) 1 % injection (mL) 2 mL Given 10/23/18 0734   Total dose as of 11/05/18 1350 2 mL Given 0737   4 mL        Radial Cocktail/Verapamil only (mL) 10 mL Given 10/23/18 0739   Total dose as  of 11/05/18 1350        10 mL        heparin injection (Units) 3,500 Units Given 10/23/18 0747   Total dose as of 11/05/18 1350        3,500 Units        Heparin (Porcine) in NaCl 1000-0.9 UT/500ML-% SOLN (mL) 500 mL Given 10/23/18 0748   Total dose as of 11/05/18 1350 500 mL Given 0748   1,000 mL        iohexol (OMNIPAQUE) 350 MG/ML injection (mL) 80 mL Given 10/23/18 0806   Total dose as of 11/05/18 1350        80 mL        Sedation Time   Sedation Time Physician-1: 30 minutes 39 seconds  Complications   Complications documented before study signed (10/23/2018 8:13 AM EST)    No complications were associated with this study.  Documented by [name], [name], MD - 10/23/2018 8:08 AM EST    Coronary Findings   Diagnostic  Dominance: Right  Left Main   Vessel is angiographically normal.  Left Anterior Descending  Dist LAD lesion 30% stenosed  Dist LAD lesion is 30% stenosed.  First Diagonal Branch  Vessel is angiographically normal.  Second Diagonal Branch  Vessel is angiographically normal.  Third Diagonal Branch  Vessel is angiographically normal.  Left Circumflex  Second Obtuse Marginal Branch  Vessel is angiographically normal.  Right Coronary Artery  Vessel is angiographically normal.  Right Posterior Descending Artery  Vessel is angiographically normal.  Right Posterior Atrioventricular Branch  Vessel is angiographically normal.  First Right Posterolateral  Vessel is angiographically normal.  Second Right Posterolateral  Vessel is angiographically normal.  Intervention   No interventions have been documented.  Wall Motion   Resting               Left Heart   Left Ventricle The left ventricle is mildly dilated. There is moderate left ventricular systolic dysfunction. LV end diastolic pressure is normal. The left ventricular ejection fraction is 35-45% by visual estimate. There are LV function abnormalities due to global hypokinesis.  Aortic Valve There is moderate aortic valve stenosis. The aortic valve is calcified. There is restricted aortic valve motion.  Coronary Diagrams   Diagnostic  Dominance: Right    Intervention   Implants    No implant documentation for this case.  Syngo Images   Show images for CARDIAC CATHETERIZATION  MERGE Images   Show images for CARDIAC CATHETERIZATION   Link to Procedure Log   Procedure Log    Hemo Data    Most Recent Value  Fick Cardiac Output 6.11 L/min  Fick Cardiac Output Index 3.44 (L/min)/BSA  Aortic Mean Gradient 27.39 mmHg  Aortic Peak Gradient 32 mmHg  Aortic Valve Area 1.21  Aortic Value Area Index 0.68 cm2/BSA  RA A Wave 6 mmHg  RA V Wave 6 mmHg  RA Mean 4 mmHg  RV Systolic Pressure 34 mmHg  RV Diastolic Pressure 4 mmHg  RV EDP 7 mmHg  PA  Systolic Pressure 30 mmHg  PA Diastolic Pressure 11 mmHg  PA Mean 18 mmHg  PW A Wave 10 mmHg  PW V Wave 10 mmHg  PW Mean 9 mmHg  AO Systolic Pressure 626 mmHg  AO Diastolic Pressure 67 mmHg  AO Mean 88 mmHg  LV Systolic Pressure 948 mmHg  LV Diastolic Pressure 8 mmHg  LV EDP 18 mmHg  AOp Systolic Pressure 546 mmHg  AOp Diastolic  Pressure 63 mmHg  AOp Mean Pressure 87 mmHg  LVp Systolic Pressure 149 mmHg  LVp Diastolic Pressure 7 mmHg  LVp EDP Pressure 16 mmHg  QP/QS 1  TPVR Index 5.23 HRUI  TSVR Index 25.55 HRUI  PVR SVR Ratio 0.11  TPVR/TSVR Ratio 0.2     Cardiac TAVR CT  TECHNIQUE: The patient was scanned on a Siemens Force 702 slice scanner. A 120 kV retrospective scan was triggered in the descending thoracic aorta at 111 HU's. Gantry rotation speed was 270 msecs and collimation was .9 mm. No beta blockade or nitro were given. The 3D data set was reconstructed in 5% intervals of the R-R cycle. Systolic and diastolic phases were analyzed on a dedicated work station using MPR, MIP and VRT modes. The patient received 80 cc of contrast.  FINDINGS: Aortic Valve: Bi cuspid with no raphe. Calcified with restricted leaflet motion  Aorta: Pseudo coarctation distal to the left subclavian minimal luminal diameter 2.4 cm  Sinotubular Junction: 25 mm  Ascending Thoracic Aorta: 28 mm  Aortic Arch: 26 mm  Descending Thoracic Aorta: 25 mm  Sinus of Valsalva Measurements: Commissural 25 mm Orthogonal 27.6 mm  Coronary Artery Height above Annulus:  Left Main: 13.8 mm  Right Coronary: 15.2 mm  Virtual Basal Annulus Measurements:  Maximum/Minimum Diameter: 28.1 mm x 22.9 mm  Perimeter: 81.8 mm  Area: 463 mm2  Coronary Arteries: Sufficient height above annulus for deployment  Optimum Fluoroscopic Angle for Delivery: RAO 11 Caudal 16 degrees  IMPRESSION: 1. Bicuspid AV with annular area of 463 mm2 suitable for a 26 mm Sapien 3 valve  2.  Optimum angiographic angle for deployment RAO 11 Caudal 16 degrees  3.  Coronary arteries suitable height above annulus for deployment  4. Pseudo Coarctation distal to the left subclavian MLD 2.4 cm should be ok for delivery system  Jenkins Rouge   Electronically Signed   By: Jenkins Rouge M.D.   On: 11/02/2018 12:07   CT ANGIOGRAPHY CHEST, ABDOMEN AND PELVIS  TECHNIQUE: Multidetector CT imaging through the chest, abdomen and pelvis was performed using the standard protocol during bolus administration of intravenous contrast. Multiplanar reconstructed images and MIPs were obtained and reviewed to evaluate the vascular anatomy.  CONTRAST:  51mL ISOVUE-370 IOPAMIDOL (ISOVUE-370) INJECTION 76%  COMPARISON:  Chest CT 10/05/2018.  FINDINGS: CTA CHEST FINDINGS  Cardiovascular: Heart size is mildly enlarged. There is no significant pericardial fluid, thickening or pericardial calcification. There is aortic atherosclerosis, as well as atherosclerosis of the great vessels of the mediastinum and the coronary arteries, including calcified atherosclerotic plaque in the left anterior descending coronary artery. Severe thickening calcification of the aortic valve.  Mediastinum/Lymph Nodes: No pathologically enlarged mediastinal or hilar lymph nodes. Esophagus is unremarkable in appearance. No axillary lymphadenopathy.  Lungs/Pleura: Linear areas of scarring in the left lower lobe and right middle lobe. No acute consolidative airspace disease. No pleural effusions. No suspicious appearing pulmonary nodules or masses.  Musculoskeletal/Soft Tissues: There are no aggressive appearing lytic or blastic lesions noted in the visualized portions of the skeleton.  CTA ABDOMEN AND PELVIS FINDINGS  Hepatobiliary: No suspicious cystic or solid hepatic lesions. No intra or extrahepatic biliary ductal dilatation. Gallbladder is normal in appearance.  Pancreas: No  pancreatic mass. No pancreatic ductal dilatation. No pancreatic or peripancreatic fluid or inflammatory changes.  Spleen: Unremarkable.  Adrenals/Urinary Tract: Subcentimeter low-attenuation lesion in the anterior aspect of the interpolar region of the left kidney, too small to characterize, but statistically likely a tiny  cyst. Right kidney and bilateral adrenal glands are normal in appearance. No hydroureteronephrosis. Urinary bladder is partially obscured by beam hardening artifact from the patient's bilateral hip arthroplasties, but visualized portions are generally unremarkable.  Stomach/Bowel: Normal appearance of the stomach. No pathologic dilatation of small bowel or colon. Normal appendix.  Vascular/Lymphatic: Aortic atherosclerosis, with vascular findings and measurements pertinent to potential TAVR procedure, as detailed above. 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 No lymphadenopathy noted in the abdomen or pelvis.  Reproductive: Status post hysterectomy. Ovaries are not confidently identified may be obscured by beam hardening artifact, or may be surgically absent or atrophic.  Other: No significant volume of ascites.  No pneumoperitoneum.  Musculoskeletal: Status post bilateral hip arthroplasty. There are no aggressive appearing lytic or blastic lesions noted in the visualized portions of the skeleton.  VASCULAR MEASUREMENTS PERTINENT TO TAVR:  AORTA:  Minimal Aortic Diameter-20 x 17 mm  Severity of Aortic Calcification-mild  RIGHT PELVIS:  Right Common Iliac Artery -  Minimal Diameter-10.9 x 9.9 mm  Tortuosity-moderate  Calcification-mild  Right External Iliac Artery -  Minimal Diameter-10.1 x 8.6 mm  Tortuosity-mild-to-moderate  Calcification-none  Right Common Femoral Artery -  Minimal Diameter-8.1 x 8.5 mm  Tortuosity-mild  Calcification-none  LEFT  PELVIS:  Left Common Iliac Artery -  Minimal Diameter-13.7 x 12.1 mm  Tortuosity-mild  Calcification-mild  Left External Iliac Artery -  Minimal Diameter-9.0 x 8.7 mm  Tortuosity-severe  Calcification-none  Left Common Femoral Artery -  Minimal Diameter-8.6 x 8.0 mm  Tortuosity-mild  Calcification-none  Review of the MIP images confirms the above findings.  IMPRESSION: 1. Vascular findings and measurements pertinent to potential TAVR procedure, as detailed above. 2. Thickening calcification of the aortic valve, compatible with the reported clinical history of severe aortic stenosis. 3. Aortic atherosclerosis, in addition to left anterior descending coronary artery disease. 4. Mild cardiomegaly. 5. Additional incidental findings, as above.   Electronically Signed   By: Vinnie Langton M.D.   On: 11/02/2018 12:24   EKG: NSR w/ baseline RBBB and left posterior fascicular block    Impression:  Patient has stage D severe, symptomatic, low gradient low ejection fraction aortic stenosis and at least moderate mitral regurgitation.  She describes a one-year history of progressive symptoms of exertional shortness of breath and fatigue culminating in her recent presentation 1 month ago with class III symptoms of chronic combined systolic and diastolic congestive heart failure.  Symptoms have improved on Entresto and oral diuretic therapy, currently [place] Heart Association functional status class II.  I have personally reviewed the patient's previous transthoracic echocardiograms, diagnostic cardiac catheterization, and CT angiograms.  Echocardiograms document the presence of a congenitally bicuspid aortic valve (Sievers type 0) with aortic stenosis.  There is moderate to severe thickening with restricted leaflet mobility involving both leaflets of the aortic valve.  Peak velocity across the aortic valve measured greater than 3.1 m/s corresponding to mean  transvalvular gradient estimated 25 mmHg.  The patient has moderate to severe global left ventricular systolic dysfunction with ejection fraction estimated only 25 to 30% on recent echocardiogram.  There is also at least moderate central mitral regurgitation.  The jet of regurgitation is broad and fills the majority of the left atrium.  Quantification of the severity of mitral regurgitation was apparently not performed.  The mitral valve leaflets are mildly thickened but appear to have normal leaflet mobility, suggesting type I mitral valve dysfunction.  Diagnostic cardiac  catheterization revealed mild nonobstructive coronary artery disease and confirmed the presence of at least moderate aortic stenosis.  Right heart pressures were not elevated and apparently there were no significant V waves on wedge tracing.  I agree the patient needs aortic valve replacement.  I am concerned by the severity of mitral regurgitation on her recent echocardiogram.  On physical exam the patient's murmur apprecieated at the LV apex actually seems louder than the murmur noted along the sternal border.  Given these findings I would feel inclined to proceed with concomitant mitral valve repair at the time of conventional surgical aortic valve replacement unless the severity of mitral regurgitation was mild on baseline transesophageal echocardiogram.  However, recent transthoracic echocardiogram suggests that the patient likely has secondary mitral regurgitation.  It is reasonable to assume that the patient's mitral regurgitation should improve following aortic valve replacement.  Cardiac-gated CTA of the heart reveals anatomical characteristics consistent with aortic stenosis suitable for treatment by transcatheter aortic valve replacement without any significant complicating features other than the presence of the bicuspid aortic valve.  Despite this, there does not appear to be significant calcification in the aortic annulus.  CTA  of the aorta and iliac vessels demonstrate what appears to be adequate pelvic vascular access to facilitate a transfemoral approach, although her iliac vessels are somewhat tortuous.  Patient does have baseline right bundle branch block and left posterior fascicular block on her EKG and would be at relatively high risk for the development of complete heart block following TAVR    Plan:  The patient was counseled at length regarding treatment alternatives for management of severe symptomatic aortic stenosis and moderate mitral regurgitation. Alternative approaches such as conventional aortic valve replacement with or without mitral valve repair, transcatheter aortic valve replacement, and continued medical therapy without intervention were compared and contrasted at length.  The risks associated with conventional surgical aortic valve replacement were discussed in detail as were expectations for post-operative convalescence.  Options for management of the patient's coexistent mitral regurgitation were discussed including my impression that mitral regurgitation will likely improve following aortic valve replacement.  The possibility of obtaining a baseline transesophageal echocardiogram to further evaluate the severity and functional anatomy associated with the patient's mitral regurgitation was discussed.  Issues specific to transcatheter aortic valve replacement were discussed including questions about long term valve durability, the potential for paravalvular leak, possible increased risk of need for permanent pacemaker placement, and other technical complications related to the procedure itself.  Long-term prognosis with medical therapy was discussed. This discussion was placed in the context of the patient's own specific clinical presentation and past medical history.  All of her questions have been addressed.  The patient desires to proceed with transcatheter aortic valve replacement in the near future.   She understands that she will be at high risk for the development of complete heart block and need for permanent pacemaker placement.  We tentatively plan to proceed with surgery on November 10, 2018.  Following the decision to proceed with transcatheter aortic valve replacement, a discussion has been held regarding what types of management strategies would be attempted intraoperatively in the event of life-threatening complications, including whether or not the patient would be considered a candidate for the use of cardiopulmonary bypass and/or conversion to open sternotomy for attempted surgical intervention.  The patient has been advised of a variety of complications that might develop including but not limited to risks of death, stroke, paravalvular leak, aortic dissection or other major vascular  complications, aortic annulus rupture, device embolization, cardiac rupture or perforation, mitral regurgitation, acute myocardial infarction, arrhythmia, heart block or bradycardia requiring permanent pacemaker placement, congestive heart failure, respiratory failure, renal failure, pneumonia, infection, other late complications related to structural valve deterioration or migration, or other complications that might ultimately cause a temporary or permanent loss of functional independence or other long term morbidity.  The patient provides full informed consent for the procedure as described and all questions were answered.   I spent in excess of 90 minutes during the conduct of this office consultation and >50% of this time involved direct face-to-face encounter with the patient for counseling and/or coordination of their care.    Valentina Gu. Roxy Manns, MD 11/05/2018 11:20 AM

## 2018-11-05 NOTE — Progress Notes (Signed)
[facility] CONSULTATION REPORT  Referring Provider is [name], [name], MD Primary Cardiologist is No primary care provider on file. PCP is Brittany Cruel, MD  Chief Complaint  Patient presents with  . Aortic Stenosis    Surgical eval for TAVR, review all studies    HPI:  Patient is a 73 year old female with bicuspid aortic valve with aortic stenosis, mitral regurgitation, chronic combined systolic and diastolic congestive heart failure, RBBB, hyperlipidemia, and degenerative arthritis who has been referred for surgical consultation to discuss treatment options for management of aortic stenosis.  Patient states that she has been told that she had a heart murmur since her youth.  She was evaluated when she went to college and told that there might of been question that she had had rheumatic fever as a child.  She has never been seen by a cardiologist until recently.  In May 2019 she developed new onset of symptoms of exertional shortness of breath and chest tightness.  Cardiac troponins were negative and EKG revealed baseline chronic right bundle branch block without acute ST or T wave changes.  She underwent a transthoracic echocardiogram that revealed normal left ventricular function with ejection fraction estimated 50 to 55%, moderate aortic stenosis, and mild mitral regurgitation.  The left atrium was severely dilated..  Peak velocity across the aortic valve was reported 2.9 Shaffer/s corresponding to mean transvalvular gradient estimated 21 mmHg and aortic valve area calculated 0.75 cm.  She was hospitalized and evaluated by Dr. Domenic Shaffer.  She underwent diagnostic cardiac catheterization by Dr. Fletcher Shaffer.  She was noted to have mild coronary artery disease with 50% stenosis of the distal left anterior descending coronary artery.  Mean transvalvular gradient across the aortic valve was measured 7 mmHg corresponding to  aortic valve area calculated 2.7 cm.  She was discharged home and she states that she was told that no further cardiac follow-up was necessary and she was to follow-up with her primary care physician.  Since that time the patient has continued to complain of exertional shortness of breath.  Previously the patient had remained remarkably active physically.  She enjoys playing tennis and golf and she walks on a regular basis.  She developed progressive symptoms of exertional shortness of breath, orthopnea, and lower extremity edema.  She was evaluated by pulmonologist and found to have no evidence of significant lung disease.  CT scan of the chest revealed mild pulmonary edema.  She asked her primary care physician to refer her back for cardiology consultation and she was evaluated by Dr. [name] October 05, 2018.  Follow-up echocardiogram performed October 07, 2018 revealed significant drop in left ventricular systolic function with ejection fraction estimated only 25 to 30%.  There was felt to be low flow, low ejection fraction, severe aortic stenosis.  Peak velocity across the aortic valve was measured greater than 3.1 Shaffer/s corresponding to mean transvalvular gradient estimated 25 mmHg.  The DVI was reported 0.22 with aortic valve area calculated 0.69 cm.  There was reportedly mild to moderate mitral regurgitation although regurgitant fraction and ERO were not reported.  She subsequently underwent repeat diagnostic cardiac catheterization on October 23, 2018.  This confirmed the presence of mild nonobstructive coronary artery disease.  There was felt to be moderate left ventricular systolic dysfunction with ejection fraction estimated 35 to 45%.  Mean transvalvular gradient across the aortic valve was measured 27 mmHg.  Right heart pressures were normal.  The patient  was referred to the multidisciplinary heart valve clinic and has been evaluated previously by Dr. Burt Shaffer.  CT angiography was performed and  surgical consultation requested.  Patient is recently widowed, having lost her husband in January 2019.  She lives alone locally in [place].  She has 2 adult children including 1 son who lives locally in [place].  She has been retired for many years.  She has been quite active physically throughout retirement.  She enjoys playing tennis and golf.  She walks on a regular basis.  She has had some issues with degenerative arthritis and has previously undergone bilateral hip replacement.  However, she remains quite active physically and has no significant physical limitations other than that of symptoms of congestive heart failure first began experience of exertional breath nearly 1 year ago.  Symptoms progressed until she was evaluated by Dr. [name] approximately 1 month ago.  By that time she was getting short of breath with moderate level activity and having trouble sleeping at night due to orthopnea.  She also had some lower extremity edema.  At that time she was started on oral diuretic and Entresto, and symptoms have improved.  However, she still gets short of breath with exertion.  She has never had any exertional chest pain or chest tightness.  She had some dizzy spells when she was for started on Entresto but this resolved when her dose was adjusted.  She has never had any syncope.  Past Medical History:  Diagnosis Date  . Abnormality of gait 10/20/2014  . Arthritis    "was in my hips; still in my knees" (01/13/2018)  . Bicuspid aortic valve   . DJD (degenerative joint disease), ankle and foot 10/20/2014  . Dyslipidemia 01/14/2018  . HAMMER TOE, ACQUIRED 07/28/2007   Qualifier: Diagnosis of  By: Lorelei Pont MD, Frederico Hamman    . Hamstring tightness of left lower extremity 03/15/2015   Persistent left hamstring tightness and pain for the last month. Her right mild scoliosis is likely contributing to her tightness. - Provided hamstring compression wrap. - Heel lifts placed on insoles to reduce hamstring  stretch. Provided with thinner set as well. - Limit explosive activities such as competitive tennis for at least one week and slowly ease back into it.  - Strengthening and stretc  . Heart murmur   . Leg cramps   . Metatarsalgia of left foot 10/20/2014  . Mild coronary artery disease 01/14/2018   LAD with 50% stenosis on [facility] 12/2017  . Mitral regurgitation   . Nonrheumatic aortic valve stenosis   . OA (osteoarthritis) of hip 03/24/2013  . Osteoarthritis of knee    Spurring and effusion noted on [place] on Left   . PLANTAR FASCIITIS 07/28/2007   Qualifier: Diagnosis of  By: Lorelei Pont MD, Frederico Hamman    . Unstable angina ([place]) 01/13/2018  . Varicose veins of bilateral lower extremities with other complications 01/06/3892    Past Surgical History:  Procedure Laterality Date  . ABDOMINAL HYSTERECTOMY  03/03/2012   Procedure: HYSTERECTOMY ABDOMINAL;  Surgeon: Alvino Chapel, MD;  Location: [facility];  Service: Gynecology;  Laterality: N/A;  . BLEPHAROPLASTY Bilateral   . JOINT REPLACEMENT    . 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 RIGHT/LEFT HEART CATH AND CORONARY ANGIOGRAPHY N/A 01/14/2018   Procedure: RIGHT/LEFT HEART CATH AND CORONARY ANGIOGRAPHY;  Surgeon: Wellington Hampshire, MD;  Location: [facility];  Service: Cardiovascular;  Laterality: N/A;  . RIGHT/LEFT HEART CATH AND CORONARY ANGIOGRAPHY N/A 10/23/2018   Procedure: RIGHT/LEFT HEART CATH AND CORONARY ANGIOGRAPHY;  Surgeon: [name], [name], MD;  Location: [facility];  Service: Cardiovascular;  Laterality: N/A;  . SALPINGOOPHORECTOMY  03/03/2012   Procedure: SALPINGO OOPHERECTOMY;  Surgeon: Alvino Chapel, MD;  Location: [facility];  Service: Gynecology;  Laterality: Bilateral;  . TONSILLECTOMY    . TOTAL HIP ARTHROPLASTY Left 03/24/2013   Procedure: LEFT TOTAL HIP ARTHROPLASTY  ANTERIOR APPROACH;  Surgeon: [name], MD;  Location: [facility];  Service: Orthopedics;  Laterality: Left;  . TOTAL HIP ARTHROPLASTY Right 12/18/2016   Procedure: RIGHT TOTAL HIP ARTHROPLASTY ANTERIOR APPROACH;  Surgeon: Gaynelle Arabian, MD;  Location: [facility];  Service: Orthopedics;  Laterality: Right;  . TUBAL LIGATION      Family History  Problem Relation Age of Onset  . Hypertension Mother   . Bone cancer Father   . Lung cancer Father   . Ovarian cancer Sister   . Diabetes Maternal Aunt   . Breast cancer Maternal Aunt        Age 73's    Social History   Socioeconomic History  . Marital status: Widowed    Spouse name: Not on file  . Number of children: Not on file  . Years of education: Not on file  . Highest education level: Not on file  Occupational History  . Not on file  Social Needs  . Financial resource strain: Not on file  . Food insecurity:    Worry: Not on file    Inability: Not on file  . Transportation needs:    Medical: Not on file    Non-medical: Not on file  Tobacco Use  . Smoking status: Never Smoker  . Smokeless tobacco: Never Used  Substance and Sexual Activity  . Alcohol use: Yes    Alcohol/week: 7.0 standard drinks    Types: 7 Standard drinks or equivalent per week    Comment: 1 / PER DAY  . Drug use: No  . Sexual activity: Not Currently    Birth control/protection: Surgical    Comment: 1st intercourse 73 yo-Fewer than 5 partners  Lifestyle  . Physical activity:    Days per week: Not on file    Minutes per session: Not on file  . Stress: Not on file  Relationships  . Social connections:    Talks on phone: Not on file    Gets together: Not on file    Attends religious service: Not on file    Active member of club or organization: Not on file    Attends meetings of clubs or organizations: Not on file    Relationship status: Not on file  . Intimate partner violence:    Fear of current or ex partner: Not on file    Emotionally abused:  Not on file    Physically abused: Not on file    Forced sexual activity: Not on file  Other Topics Concern  . Not on file  Social History Narrative   Lives locally.  Retired. Active - walks frequently and plays tennis regularly.    Current Outpatient Medications  Medication Sig Dispense Refill  . acetaminophen (TYLENOL) 500 MG tablet Take 500-1,000 mg by mouth every 6 (six) hours as needed for moderate pain.    [name] aspirin EC 81 MG tablet Take 81 mg by mouth daily.    . diclofenac  sodium (VOLTAREN) 1 % GEL Apply 1 application topically 4 (four) times daily as needed (pain).    [name] estradiol (CLIMARA - DOSED IN MG/24 HR) 0.025 mg/24hr patch PLACE 1 PATCH (0.025 MG TOTAL) ONTO THE SKIN ONCE A WEEK. 4 patch 12  . etodolac (LODINE) 500 MG tablet Take 1 tablet (500 mg total) by mouth daily. 30 tablet 6  . famotidine (PEPCID AC) 10 MG tablet Take 10 mg by mouth daily as needed for heartburn or indigestion.    . furosemide (LASIX) 20 MG tablet Take 1 tablet (20 mg total) by mouth daily. 90 tablet 3  . Menthol-Methyl Salicylate (MUSCLE RUB EX) Apply 1 application topically daily as needed (muscle pain).    . potassium chloride SA (K-DUR,KLOR-CON) 20 MEQ tablet Take 1 tablet (20 mEq total) by mouth daily. 90 tablet 3  . sacubitril-valsartan (ENTRESTO) 24-26 MG Take 0.5 tablets by mouth 2 (two) times daily.  60 tablet   . UNABLE TO FIND Hyland's Leg Cramps tablets: Take 1 tablet by mouth during the night as needed for leg cramps    . zolpidem (AMBIEN) 10 MG tablet Take 1 tablet (10 mg total) by mouth at bedtime as needed. For sleep 30 tablet 1   No current facility-administered medications for this visit.     Allergies  Allergen Reactions  . Codeine Other (See Comments)    Hallucinations      Review of Systems:   General:  normal appetite, decreased energy, no weight gain, no weight loss, no fever  Cardiac:  no chest pain with exertion, no chest pain at rest, +SOB with exertion, no resting  SOB, no PND, + orthopnea, no palpitations, no arrhythmia, no atrial fibrillation, + LE edema, no dizzy spells, no syncope  Respiratory:  + shortness of breath, no home oxygen, no productive cough, no dry cough, no bronchitis, no wheezing, no hemoptysis, no asthma, no pain with inspiration or cough, no sleep apnea, no CPAP at night  GI:   no difficulty swallowing, no reflux, no frequent heartburn, no hiatal hernia, no abdominal pain, no constipation, no diarrhea, no hematochezia, no hematemesis, no melena  GU:   no dysuria,  no frequency, no urinary tract infection, no hematuria, no kidney stones, no kidney disease  Vascular:  no pain suggestive of claudication, no pain in feet, + leg cramps, + varicose veins, no DVT, no non-healing foot ulcer  Neuro:   no stroke, no TIA's, no seizures, no headaches, no temporary blindness one eye,  no slurred speech, no peripheral neuropathy, no chronic pain, no instability of gait, no memory/cognitive dysfunction  Musculoskeletal: + arthritis, no joint swelling, no myalgias, no difficulty walking, normal mobility   Skin:   no rash, no itching, no skin infections, no pressure sores or ulcerations  Psych:   no anxiety, no depression, no nervousness, + unusual recent stress  Eyes:   no blurry vision, no floaters, no recent vision changes, + wears glasses for reading  ENT:   no hearing loss, no loose or painful teeth, no dentures, last saw dentist January 2020  Hematologic:  no easy bruising, no abnormal bleeding, no clotting disorder, no frequent epistaxis  Endocrine:  no diabetes, does not check CBG's at home           Physical Exam:   BP 120/80   Pulse 70   Resp 20   Ht 5' 7.5" (1.715 Shaffer)   Wt 147 lb (66.7 kg)   SpO2 99%   BMI 22.68 kg/Shaffer  General:  Thin,  well-appearing  HEENT:  Unremarkable   Neck:   no JVD, no bruits, no adenopathy   Chest:   clear to auscultation, symmetrical breath sounds, no wheezes, no rhonchi   CV:   RRR, grade II/VI  crescendo/decrescendo murmur heard best at RSB, more prominent holosystolic murmur at apex, no diastolic murmur  Abdomen:  soft, non-tender, no masses   Extremities:  warm, well-perfused, pulses palpable, no LE edema  Rectal/GU  Deferred  Neuro:   Grossly non-focal and symmetrical throughout  Skin:   Clean and dry, no rashes, no breakdown   Diagnostic Tests:  Transthoracic Echocardiography  Patient:    Brittany Shaffer, Brittany Shaffer MR #:       [id number] Study Date: 01/14/2018 Gender:     F Age:        22 Height:     171.5 cm Weight:     68.1 kg BSA:        1.8 Shaffer^2 Pt. Status: Room:   ADMITTING    Rozann Lesches, Shaffer.D.  ATTENDING    Rozann Lesches, Shaffer.D.  ORDERING     Murray Hodgkins, MD  REFERRING    Murray Hodgkins, MD  PERFORMING   [facility], Inpatient  SONOGRAPHER  Mikki Santee  cc:  ------------------------------------------------------------------- LV EF: 50% -   55%  ------------------------------------------------------------------- Indications:      Murmur 785.2.  ------------------------------------------------------------------- History:   PMH:   Unstable angina.  ------------------------------------------------------------------- Study Conclusions  - Left ventricle: The cavity size was normal. Systolic function was   normal. The estimated ejection fraction was in the range of 50%   to 55%. Wall motion was normal; there were no regional wall   motion abnormalities. The study is not technically sufficient to   allow evaluation of LV diastolic function. - Aortic valve: Severe diffuse calcification involving the right   coronary and noncoronary cusp. There was moderate stenosis. Mean   gradient (S): 21 mm Hg. Valve area (VTI): 0.75 cm^2. Valve area   (Vmax): 0.84 cm^2. Valve area (Vmean): 0.8 cm^2. - Mitral valve: Mild diffuse calcification of the anterior leaflet.   There was mild regurgitation. - Left atrium: The atrium was severely dilated. - Right  ventricle: The cavity size was mildly dilated. Wall   thickness was normal. - Pulmonary arteries: PA peak pressure: 36 mm Hg (S). - Inferior vena cava: The vessel was mildly dilated.  Impressions:  - The right ventricular systolic pressure was increased consistent   with mild pulmonary hypertension.  ------------------------------------------------------------------- Study data:  No prior study was available for comparison.  Study status:  Routine.  Procedure:  The patient reported no pain pre or post test. Transthoracic echocardiography. Image quality was adequate.  Study completion:  There were no complications. Transthoracic echocardiography.  Shaffer-mode, complete 2D, spectral Doppler, and color Doppler.  Birthdate:  Patient birthdate: Oct 30, 1945.  Age:  Patient is 73 yr old.  Sex:  Gender: female. BMI: 23.1 kg/Shaffer^2.  Blood pressure:     136/71  Patient status: Inpatient.  Study date:  Study date: 01/14/2018. Study time: 09:59 AM.  [facility]  [facility].  -------------------------------------------------------------------  ------------------------------------------------------------------- Left ventricle:  The cavity size was normal. Systolic function was normal. The estimated ejection fraction was in the range of 50% to 55%. Wall motion was normal; there were no regional wall motion abnormalities. The study is not technically sufficient to allow evaluation of LV diastolic function.  ------------------------------------------------------------------- Aortic valve:   Trileaflet.  Severe diffuse calcification involving the right coronary and  noncoronary cusp. Mobility was not restricted.  Doppler:   There was moderate stenosis.   There was no regurgitation.    VTI ratio of LVOT to aortic valve: 0.24. Valve area (VTI): 0.75 cm^2. Indexed valve area (VTI): 0.42 cm^2/Shaffer^2. Peak velocity ratio of LVOT to aortic valve: 0.27. Valve area (Vmax): 0.84 cm^2. Indexed valve area  (Vmax): 0.47 cm^2/Shaffer^2. Mean velocity ratio of LVOT to aortic valve: 0.25. Valve area (Vmean): 0.8 cm^2. Indexed valve area (Vmean): 0.44 cm^2/Shaffer^2.    Mean gradient (S): 21 mm Hg. Peak gradient (S): 34 mm Hg.  ------------------------------------------------------------------- Aorta:  Aortic root: The aortic root was normal in size.  ------------------------------------------------------------------- Mitral valve:   Mild diffuse calcification of the anterior leaflet. Mobility was not restricted.  Doppler:  Transvalvular velocity was within the normal range. There was no evidence for stenosis. There was mild regurgitation.    Peak gradient (D): 4 mm Hg.  ------------------------------------------------------------------- Left atrium:  The atrium was severely dilated.  ------------------------------------------------------------------- Right ventricle:  The cavity size was mildly dilated. Wall thickness was normal. Systolic function was normal.  ------------------------------------------------------------------- Pulmonic valve:    Structurally normal valve.   Cusp separation was normal.  Doppler:  Transvalvular velocity was within the normal range. There was no evidence for stenosis. There was no regurgitation.  ------------------------------------------------------------------- Tricuspid valve:   Structurally normal valve.    Doppler: Transvalvular velocity was within the normal range. There was mild regurgitation.  ------------------------------------------------------------------- Pulmonary artery:   The main pulmonary artery was normal-sized. Systolic pressure was within the normal range.  ------------------------------------------------------------------- Right atrium:  The atrium was normal in size.  ------------------------------------------------------------------- Pericardium:  There was no pericardial  effusion.  ------------------------------------------------------------------- Systemic veins: Inferior vena cava: The vessel was mildly dilated.  ------------------------------------------------------------------- Measurements   Left ventricle                           Value          Reference  LV ID, ED, PLAX chordal                  50    mm       43 - 52  LV ID, ES, PLAX chordal                  37    mm       23 - 38  LV fx shortening, PLAX chordal   (L)     26    %        >=29  LV PW thickness, ED                      11    mm       ----------  IVS/LV PW ratio, ED                      0.82           <=1.3  Stroke volume, 2D                        58    ml       ----------  Stroke volume/bsa, 2D                    32    ml/Shaffer^2   ----------  LV e&', medial  2.51  cm/s     ----------  LV E/e&', medial                          37.29          ----------    Ventricular septum                       Value          Reference  IVS thickness, ED                        9     mm       ----------    LVOT                                     Value          Reference  LVOT ID, S                               20    mm       ----------  LVOT area                                3.14  cm^2     ----------  LVOT peak velocity, S                    77.4  cm/s     ----------  LVOT mean velocity, S                    53.9  cm/s     ----------  LVOT VTI, S                              18.6  cm       ----------    Aortic valve                             Value          Reference  Aortic valve peak velocity, S            291   cm/s     ----------  Aortic valve mean velocity, S            212   cm/s     ----------  Aortic valve VTI, S                      77.8  cm       ----------  Aortic mean gradient, S                  21    mm Hg    ----------  Aortic peak gradient, S                  34    mm Hg    ----------  VTI ratio, LVOT/AV                       0.24            ----------  Aortic valve area, VTI                   0.75  cm^2     ----------  Aortic valve area/bsa, VTI               0.42  cm^2/Shaffer^2 ----------  Velocity ratio, peak, LVOT/AV            0.27           ----------  Aortic valve area, peak velocity         0.84  cm^2     ----------  Aortic valve area/bsa, peak              0.47  cm^2/Shaffer^2 ----------  velocity  Velocity ratio, mean, LVOT/AV            0.25           ----------  Aortic valve area, mean velocity         0.8   cm^2     ----------  Aortic valve area/bsa, mean              0.44  cm^2/Shaffer^2 ----------  velocity    Aorta                                    Value          Reference  Aortic root ID, ED                       28    mm       ----------    Left atrium                              Value          Reference  LA ID, A-P, ES                           37    mm       ----------  LA ID/bsa, A-P                           2.05  cm/Shaffer^2   <=2.2  LA volume, S                             89.3  ml       ----------  LA volume/bsa, S                         49.5  ml/Shaffer^2   ----------  LA volume, ES, 1-p A4C                   70.5  ml       ----------  LA volume/bsa, ES, 1-p A4C               39.1  ml/Shaffer^2   ----------  LA volume, ES, 1-p A2C                   102   ml       ----------  LA volume/bsa, ES, 1-p A2C  56.5  ml/Shaffer^2   ----------    Mitral valve                             Value          Reference  Mitral E-wave peak velocity              93.6  cm/s     ----------  Mitral A-wave peak velocity              37.8  cm/s     ----------  Mitral deceleration time         (H)     243   ms       150 - 230  Mitral peak gradient, D                  4     mm Hg    ----------  Mitral E/A ratio, peak                   2.5            ----------    Pulmonary arteries                       Value          Reference  PA pressure, S, DP               (H)     36    mm Hg    <=30    Tricuspid valve                          Value           Reference  Tricuspid regurg peak velocity           265   cm/s     ----------  Tricuspid peak RV-RA gradient            28    mm Hg    ----------    Right atrium                             Value          Reference  RA ID, S-I, ES, A4C              (H)     59    mm       34 - 49  RA area, ES, A4C                 (H)     21.6  cm^2     8.3 - 19.5  RA volume, ES, A/L                       63.2  ml       ----------  RA volume/bsa, ES, A/L                   35    ml/Shaffer^2   ----------    Systemic veins                           Value          Reference  Estimated CVP  8     mm Hg    ----------    Right ventricle                          Value          Reference  TAPSE                                    26    mm       ----------  RV pressure, S, DP               (H)     36    mm Hg    <=30  RV s&', lateral, S                        11.1  cm/s     ----------  Legend: (L)  and  (H)  mark values outside specified reference range.  ------------------------------------------------------------------- Prepared and Electronically Authenticated by  Fransico Him, MD 2019-05-15T11:20:14   TRANSTHORACIC ECHOCARDIOGRAM REPORT     Patient Name:   Brittany Shaffer  Date of Exam: 10/07/2018 Medical Rec #:  [id number]     Height:       67.5 in Accession #:    [id number]    Weight:       152.2 lb Date of Birth:  1946-05-18     BSA:          1.81 Shaffer Patient Age:    65 years      BP:           118/76 mmHg Patient Gender: F             HR:           77 bpm. Exam Location:  [facility]    Procedure: 2D Echo, Cardiac Doppler and Color Doppler  Indications:    R06.00 Dyspnea   History:        Patient has prior history of Echocardiogram examinations, most                 recent 01/14/2018.   Sonographer:    Wilford Sports Rodgers-Jones RDCS Referring Phys: [id number] PETER Shaffer [name]  IMPRESSIONS    1. The left ventricle has severely reduced systolic function of 86-57%. The  cavity size is normal. There is no increased left ventricular wall thickness. Echo evidence of restrictive diastolic relaxation Elevated mean left atrial pressure Left  ventricular diffuse hypokinesis.  2. The right ventricle has mildly reduced systolic function. The cavity in mildly enlarged in size. There is no increase in right ventricular wall thickness. Right ventricular systolic pressure is severely elevated.  3. Moderately dilated left atrial size.  4. Mildly dilated right atrial size.  5. The tricuspid valve is normal in structure. Tricuspid valve regurgitation is moderate-severe.  6. The aortic valve has an indeterminant number of cusps. There is moderate calcification of the aortic valve. Severe AS (mean gradient 25 mmHg and AVA 0.69 cm2).  7. The inferior vena cava was dilated in size with >50% respiratory variability.  8. Possible small oscillating density on anterior MV annulus vs aortic valve; consider TEE if clinically indicated.  9. Severe LV dysfunction; restrictive filling; calcified aortic valve with severe AS; biatrial enlargement; mild MR; mild RVE with mild RV dysfunction; moderate to severe TR with severe pulmonary  hypertension; cannot R/O oscillating density as described  above; consider TEE.  FINDINGS  Left Ventricle: The left ventricle has severely reduced systolic function of 34-74%. The cavity size is normal. There is no increased left ventricular wall thickness. Echo evidence of restrictive diastolic relaxation Elevated mean left atrial pressure  Left ventricular diffuse hypokinesis. There is akinesis of the entire inferolateral left ventricular segment. Right Ventricle: The right ventricle has mildly reduced systolic function. The cavity in mildly enlarged in size. There is no increase in right ventricular wall thickness. Right ventricular systolic pressure is severely elevated. Left Atrium: is moderately dilated Right Atrium: is mildly dilated Interatrial Septum: No  atrial level shunt detected by color flow Doppler.  Pericardium: There is no evidence of pericardial effusion. Mitral Valve: The mitral valve is normal in structure There is mild thickening. There is mild mitral annular calcification present. Mitral valve regurgitation is mild to moderate by color flow Doppler. Tricuspid Valve: The tricuspid valve is normal in structure. Tricuspid valve regurgitation is moderate-severe by color flow Doppler. Aortic Valve: The aortic valve has an indeterminant number of cusps. There is moderate calcification of the aortic valve. Aortic valve regurgitation was not visualized by color flow Doppler. The calculated aortic valve area is 0.69 cm, consistent with  severe stenosis. Pulmonic Valve: The pulmonic valve is normal in structure. Pulmonic valve regurgitation is not visualized by color flow Doppler. Venous: The inferior vena cava was dilated in size with greater than 50% respiratory variability.   LEFT VENTRICLE PLAX 2D (Teich) LV EF:          36.5 %   Diastology LVIDd:          5.10 cm  LV e' lateral:   5.44 cm/s LVIDs:          4.20 cm  LV E/e' lateral: 19.7 LV PW:          0.90 cm  LV e' medial:    3.92 cm/s LV IVS:         1.10 cm  LV E/e' medial:  27.3 LVOT diam:      2.00 cm LV SV:          45 ml LVOT Area:      3.14 cm  RIGHT VENTRICLE RV Basal diam:  4.70 cm RV S prime:     10.30 cm/s TAPSE (Shaffer-mode): 2.0 cm  LEFT ATRIUM             Index       RIGHT ATRIUM           Index LA diam:        5.10 cm 2.82 cm/Shaffer  RA Area:     19.60 cm LA Vol (A2C):   56.7 ml 31.32 ml/Shaffer RA Volume:   53.70 ml  29.66 ml/Shaffer LA Vol (A4C):   64.3 ml 35.51 ml/Shaffer LA Biplane Vol: 63.0 ml 34.80 ml/Shaffer  AORTIC VALVE AV Area (Vmax):    0.68 cm AV Area (Vmean):   0.65 cm AV Area (VTI):     0.69 cm AV Vmax:           312.40 cm/s AV Vmean:          237.200 cm/s AV VTI:            0.617 Shaffer AV Peak Grad:      39.0 mmHg AV Mean Grad:      25.0 mmHg LVOT Vmax:          67.20 cm/s  LVOT Vmean:        49.300 cm/s LVOT VTI:          0.136 Shaffer LVOT/AV VTI ratio: 0.22   AORTA Ao Root diam: 3.00 cm  MITRAL VALVE               TR Peak grad: 64.6 mmHg                            TR Vmax:      417.00 cm/s   MV Decel Time: 158 msec MR Peak grad: 97.2 mmHg MR Mean grad: 64.0 mmHg MR Vmax:      493.00 cm/s MR Vmean:     378.0 cm/s MV E velocity: 107.00 cm/s MV A velocity: 36.10 cm/s MV E/A ratio:  2.96    Kirk Ruths MD Electronically signed by Kirk Ruths MD Signature Date/Time: 10/07/2018/5:19:59 PM    RIGHT/LEFT HEART CATH AND CORONARY ANGIOGRAPHY  Conclusion     Dist LAD lesion is 30% stenosed.  There is moderate left ventricular systolic dysfunction.  The left ventricular ejection fraction is 35-45% by visual estimate.  LV end diastolic pressure is normal.  There is moderate aortic valve stenosis.   1. Minimal nonobstructive CAD 2. Moderate LV dysfunction. EF 35-40%. 3. At least moderate AS. Mean gradient 27 mm Hg. AVA may be underestimated due to decreased LV function 4. Normal LV filling pressures. 5. Normal right heart pressures.  Plan: will refer to Valvular heart team for consideration of TAVR.      Recommendations   Antiplatelet/Anticoag Recommend Aspirin 81mg  daily for moderate CAD.  Indications   Nonrheumatic aortic valve stenosis [I35.0 (ICD-10-CM)]  Procedural Details   Technical Details Indication: 73 yo WF with progressive aortic stenosis, worsening CHF and decrease in LV function.  Procedural Details: The right wrist was prepped, draped, and anesthetized with 1% lidocaine. Using the modified Seldinger technique a 6 Fr slender sheath was placed in the right radial artery and a 5 French sheath was placed in the right brachial vein. A Swan-Ganz catheter was used for the right heart catheterization. Standard protocol was followed for recording of right heart pressures and sampling of oxygen saturations. Fick  cardiac output was calculated. Standard Judkins catheters were used for selective coronary angiography and left ventriculography. There were no immediate procedural complications. The patient was transferred to the post catheterization recovery area for further monitoring. Contrast: 80 cc Estimated blood loss <50 mL.   During this procedure medications were administered to achieve and maintain moderate conscious sedation while the patient's heart rate, blood pressure, and oxygen saturation were continuously monitored and I was present face-to-face 100% of this time.  Medications  (Filter: Administrations occurring from 10/23/18 0720 to 10/23/18 0809)  Medication Rate/Dose/Volume Action  Date Time   midazolam (VERSED) injection (mg) 1 mg Given 10/23/18 0732   Total dose as of 11/05/18 1350        1 mg        fentaNYL (SUBLIMAZE) injection (mcg) 25 mcg Given 10/23/18 0733   Total dose as of 11/05/18 1350        25 mcg        lidocaine (PF) (XYLOCAINE) 1 % injection (mL) 2 mL Given 10/23/18 0734   Total dose as of 11/05/18 1350 2 mL Given 0737   4 mL        Radial Cocktail/Verapamil only (mL) 10 mL Given 10/23/18 0739   Total dose as  of 11/05/18 1350        10 mL        heparin injection (Units) 3,500 Units Given 10/23/18 0747   Total dose as of 11/05/18 1350        3,500 Units        Heparin (Porcine) in NaCl 1000-0.9 UT/500ML-% SOLN (mL) 500 mL Given 10/23/18 0748   Total dose as of 11/05/18 1350 500 mL Given 0748   1,000 mL        iohexol (OMNIPAQUE) 350 MG/ML injection (mL) 80 mL Given 10/23/18 0806   Total dose as of 11/05/18 1350        80 mL        Sedation Time   Sedation Time Physician-1: 30 minutes 39 seconds  Complications   Complications documented before study signed (10/23/2018 8:13 AM EST)    No complications were associated with this study.  Documented by [name], [name], MD - 10/23/2018 8:08 AM EST    Coronary Findings   Diagnostic  Dominance: Right  Left Main   Vessel is angiographically normal.  Left Anterior Descending  Dist LAD lesion 30% stenosed  Dist LAD lesion is 30% stenosed.  First Diagonal Branch  Vessel is angiographically normal.  Second Diagonal Branch  Vessel is angiographically normal.  Third Diagonal Branch  Vessel is angiographically normal.  Left Circumflex  Second Obtuse Marginal Branch  Vessel is angiographically normal.  Right Coronary Artery  Vessel is angiographically normal.  Right Posterior Descending Artery  Vessel is angiographically normal.  Right Posterior Atrioventricular Branch  Vessel is angiographically normal.  First Right Posterolateral  Vessel is angiographically normal.  Second Right Posterolateral  Vessel is angiographically normal.  Intervention   No interventions have been documented.  Wall Motion   Resting               Left Heart   Left Ventricle The left ventricle is mildly dilated. There is moderate left ventricular systolic dysfunction. LV end diastolic pressure is normal. The left ventricular ejection fraction is 35-45% by visual estimate. There are LV function abnormalities due to global hypokinesis.  Aortic Valve There is moderate aortic valve stenosis. The aortic valve is calcified. There is restricted aortic valve motion.  Coronary Diagrams   Diagnostic  Dominance: Right    Intervention   Implants    No implant documentation for this case.  Syngo Images   Show images for CARDIAC CATHETERIZATION  MERGE Images   Show images for CARDIAC CATHETERIZATION   Link to Procedure Log   Procedure Log    Hemo Data    Most Recent Value  Fick Cardiac Output 6.11 L/min  Fick Cardiac Output Index 3.44 (L/min)/BSA  Aortic Mean Gradient 27.39 mmHg  Aortic Peak Gradient 32 mmHg  Aortic Valve Area 1.21  Aortic Value Area Index 0.68 cm2/BSA  RA A Wave 6 mmHg  RA V Wave 6 mmHg  RA Mean 4 mmHg  RV Systolic Pressure 34 mmHg  RV Diastolic Pressure 4 mmHg  RV EDP 7 mmHg  PA  Systolic Pressure 30 mmHg  PA Diastolic Pressure 11 mmHg  PA Mean 18 mmHg  PW A Wave 10 mmHg  PW V Wave 10 mmHg  PW Mean 9 mmHg  AO Systolic Pressure 852 mmHg  AO Diastolic Pressure 67 mmHg  AO Mean 88 mmHg  LV Systolic Pressure 778 mmHg  LV Diastolic Pressure 8 mmHg  LV EDP 18 mmHg  AOp Systolic Pressure 242 mmHg  AOp Diastolic  Pressure 63 mmHg  AOp Mean Pressure 87 mmHg  LVp Systolic Pressure 315 mmHg  LVp Diastolic Pressure 7 mmHg  LVp EDP Pressure 16 mmHg  QP/QS 1  TPVR Index 5.23 HRUI  TSVR Index 25.55 HRUI  PVR SVR Ratio 0.11  TPVR/TSVR Ratio 0.2     Cardiac TAVR CT  TECHNIQUE: The patient was scanned on a Siemens Force 400 slice scanner. A 120 kV retrospective scan was triggered in the descending thoracic aorta at 111 HU's. Gantry rotation speed was 270 msecs and collimation was .9 mm. No beta blockade or nitro were given. The 3D data set was reconstructed in 5% intervals of the R-R cycle. Systolic and diastolic phases were analyzed on a dedicated work station using MPR, MIP and VRT modes. The patient received 80 cc of contrast.  FINDINGS: Aortic Valve: Bi cuspid with no raphe. Calcified with restricted leaflet motion  Aorta: Pseudo coarctation distal to the left subclavian minimal luminal diameter 2.4 cm  Sinotubular Junction: 25 mm  Ascending Thoracic Aorta: 28 mm  Aortic Arch: 26 mm  Descending Thoracic Aorta: 25 mm  Sinus of Valsalva Measurements: Commissural 25 mm Orthogonal 27.6 mm  Coronary Artery Height above Annulus:  Left Main: 13.8 mm  Right Coronary: 15.2 mm  Virtual Basal Annulus Measurements:  Maximum/Minimum Diameter: 28.1 mm x 22.9 mm  Perimeter: 81.8 mm  Area: 463 mm2  Coronary Arteries: Sufficient height above annulus for deployment  Optimum Fluoroscopic Angle for Delivery: RAO 11 Caudal 16 degrees  IMPRESSION: 1. Bicuspid AV with annular area of 463 mm2 suitable for a 26 mm Sapien 3 valve  2.  Optimum angiographic angle for deployment RAO 11 Caudal 16 degrees  3.  Coronary arteries suitable height above annulus for deployment  4. Pseudo Coarctation distal to the left subclavian MLD 2.4 cm should be ok for delivery system  Jenkins Rouge   Electronically Signed   By: Jenkins Rouge Shaffer.D.   On: 11/02/2018 12:07   CT ANGIOGRAPHY CHEST, ABDOMEN AND PELVIS  TECHNIQUE: Multidetector CT imaging through the chest, abdomen and pelvis was performed using the standard protocol during bolus administration of intravenous contrast. Multiplanar reconstructed images and MIPs were obtained and reviewed to evaluate the vascular anatomy.  CONTRAST:  64mL ISOVUE-370 IOPAMIDOL (ISOVUE-370) INJECTION 76%  COMPARISON:  Chest CT 10/05/2018.  FINDINGS: CTA CHEST FINDINGS  Cardiovascular: Heart size is mildly enlarged. There is no significant pericardial fluid, thickening or pericardial calcification. There is aortic atherosclerosis, as well as atherosclerosis of the great vessels of the mediastinum and the coronary arteries, including calcified atherosclerotic plaque in the left anterior descending coronary artery. Severe thickening calcification of the aortic valve.  Mediastinum/Lymph Nodes: No pathologically enlarged mediastinal or hilar lymph nodes. Esophagus is unremarkable in appearance. No axillary lymphadenopathy.  Lungs/Pleura: Linear areas of scarring in the left lower lobe and right middle lobe. No acute consolidative airspace disease. No pleural effusions. No suspicious appearing pulmonary nodules or masses.  Musculoskeletal/Soft Tissues: There are no aggressive appearing lytic or blastic lesions noted in the visualized portions of the skeleton.  CTA ABDOMEN AND PELVIS FINDINGS  Hepatobiliary: No suspicious cystic or solid hepatic lesions. No intra or extrahepatic biliary ductal dilatation. Gallbladder is normal in appearance.  Pancreas: No  pancreatic mass. No pancreatic ductal dilatation. No pancreatic or peripancreatic fluid or inflammatory changes.  Spleen: Unremarkable.  Adrenals/Urinary Tract: Subcentimeter low-attenuation lesion in the anterior aspect of the interpolar region of the left kidney, too small to characterize, but statistically likely a tiny  cyst. Right kidney and bilateral adrenal glands are normal in appearance. No hydroureteronephrosis. Urinary bladder is partially obscured by beam hardening artifact from the patient's bilateral hip arthroplasties, but visualized portions are generally unremarkable.  Stomach/Bowel: Normal appearance of the stomach. No pathologic dilatation of small bowel or colon. Normal appendix.  Vascular/Lymphatic: Aortic atherosclerosis, with vascular findings and measurements pertinent to potential TAVR procedure, as detailed above. 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 No lymphadenopathy noted in the abdomen or pelvis.  Reproductive: Status post hysterectomy. Ovaries are not confidently identified may be obscured by beam hardening artifact, or may be surgically absent or atrophic.  Other: No significant volume of ascites.  No pneumoperitoneum.  Musculoskeletal: Status post bilateral hip arthroplasty. There are no aggressive appearing lytic or blastic lesions noted in the visualized portions of the skeleton.  VASCULAR MEASUREMENTS PERTINENT TO TAVR:  AORTA:  Minimal Aortic Diameter-20 x 17 mm  Severity of Aortic Calcification-mild  RIGHT PELVIS:  Right Common Iliac Artery -  Minimal Diameter-10.9 x 9.9 mm  Tortuosity-moderate  Calcification-mild  Right External Iliac Artery -  Minimal Diameter-10.1 x 8.6 mm  Tortuosity-mild-to-moderate  Calcification-none  Right Common Femoral Artery -  Minimal Diameter-8.1 x 8.5 mm  Tortuosity-mild  Calcification-none  LEFT  PELVIS:  Left Common Iliac Artery -  Minimal Diameter-13.7 x 12.1 mm  Tortuosity-mild  Calcification-mild  Left External Iliac Artery -  Minimal Diameter-9.0 x 8.7 mm  Tortuosity-severe  Calcification-none  Left Common Femoral Artery -  Minimal Diameter-8.6 x 8.0 mm  Tortuosity-mild  Calcification-none  Review of the MIP images confirms the above findings.  IMPRESSION: 1. Vascular findings and measurements pertinent to potential TAVR procedure, as detailed above. 2. Thickening calcification of the aortic valve, compatible with the reported clinical history of severe aortic stenosis. 3. Aortic atherosclerosis, in addition to left anterior descending coronary artery disease. 4. Mild cardiomegaly. 5. Additional incidental findings, as above.   Electronically Signed   By: Vinnie Langton Shaffer.D.   On: 11/02/2018 12:24   EKG: NSR w/ baseline RBBB and left posterior fascicular block    Impression:  Patient has stage D severe, symptomatic, low gradient low ejection fraction aortic stenosis and at least moderate mitral regurgitation.  She describes a one-year history of progressive symptoms of exertional shortness of breath and fatigue culminating in her recent presentation 1 month ago with class III symptoms of chronic combined systolic and diastolic congestive heart failure.  Symptoms have improved on Entresto and oral diuretic therapy, currently New [place] Heart Association functional status class II.  I have personally reviewed the patient's previous transthoracic echocardiograms, diagnostic cardiac catheterization, and CT angiograms.  Echocardiograms document the presence of a congenitally bicuspid aortic valve (Sievers type 0) with aortic stenosis.  There is moderate to severe thickening with restricted leaflet mobility involving both leaflets of the aortic valve.  Peak velocity across the aortic valve measured greater than 3.1 Shaffer/s corresponding to mean  transvalvular gradient estimated 25 mmHg.  The patient has moderate to severe global left ventricular systolic dysfunction with ejection fraction estimated only 25 to 30% on recent echocardiogram.  There is also at least moderate central mitral regurgitation.  The jet of regurgitation is broad and fills the majority of the left atrium.  Quantification of the severity of mitral regurgitation was apparently not performed.  The mitral valve leaflets are mildly thickened but appear to have normal leaflet mobility, suggesting type I mitral valve dysfunction.  Diagnostic cardiac  catheterization revealed mild nonobstructive coronary artery disease and confirmed the presence of at least moderate aortic stenosis.  Right heart pressures were not elevated and apparently there were no significant V waves on wedge tracing.  I agree the patient needs aortic valve replacement.  I am concerned by the severity of mitral regurgitation on her recent echocardiogram.  On physical exam the patient's murmur apprecieated at the LV apex actually seems louder than the murmur noted along the sternal border.  Given these findings I would feel inclined to proceed with concomitant mitral valve repair at the time of conventional surgical aortic valve replacement unless the severity of mitral regurgitation was mild on baseline transesophageal echocardiogram.  However, recent transthoracic echocardiogram suggests that the patient likely has secondary mitral regurgitation.  It is reasonable to assume that the patient's mitral regurgitation should improve following aortic valve replacement.  Cardiac-gated CTA of the heart reveals anatomical characteristics consistent with aortic stenosis suitable for treatment by transcatheter aortic valve replacement without any significant complicating features other than the presence of the bicuspid aortic valve.  Despite this, there does not appear to be significant calcification in the aortic annulus.  CTA  of the aorta and iliac vessels demonstrate what appears to be adequate pelvic vascular access to facilitate a transfemoral approach, although her iliac vessels are somewhat tortuous.  Patient does have baseline right bundle branch block and left posterior fascicular block on her EKG and would be at relatively high risk for the development of complete heart block following TAVR    Plan:  The patient was counseled at length regarding treatment alternatives for management of severe symptomatic aortic stenosis and moderate mitral regurgitation. Alternative approaches such as conventional aortic valve replacement with or without mitral valve repair, transcatheter aortic valve replacement, and continued medical therapy without intervention were compared and contrasted at length.  The risks associated with conventional surgical aortic valve replacement were discussed in detail as were expectations for post-operative convalescence.  Options for management of the patient's coexistent mitral regurgitation were discussed including my impression that mitral regurgitation will likely improve following aortic valve replacement.  The possibility of obtaining a baseline transesophageal echocardiogram to further evaluate the severity and functional anatomy associated with the patient's mitral regurgitation was discussed.  Issues specific to transcatheter aortic valve replacement were discussed including questions about long term valve durability, the potential for paravalvular leak, possible increased risk of need for permanent pacemaker placement, and other technical complications related to the procedure itself.  Long-term prognosis with medical therapy was discussed. This discussion was placed in the context of the patient's own specific clinical presentation and past medical history.  All of her questions have been addressed.  The patient desires to proceed with transcatheter aortic valve replacement in the near future.   She understands that she will be at high risk for the development of complete heart block and need for permanent pacemaker placement.  We tentatively plan to proceed with surgery on November 10, 2018.  Following the decision to proceed with transcatheter aortic valve replacement, a discussion has been held regarding what types of management strategies would be attempted intraoperatively in the event of life-threatening complications, including whether or not the patient would be considered a candidate for the use of cardiopulmonary bypass and/or conversion to open sternotomy for attempted surgical intervention.  The patient has been advised of a variety of complications that might develop including but not limited to risks of death, stroke, paravalvular leak, aortic dissection or other major vascular  complications, aortic annulus rupture, device embolization, cardiac rupture or perforation, mitral regurgitation, acute myocardial infarction, arrhythmia, heart block or bradycardia requiring permanent pacemaker placement, congestive heart failure, respiratory failure, renal failure, pneumonia, infection, other late complications related to structural valve deterioration or migration, or other complications that might ultimately cause a temporary or permanent loss of functional independence or other long term morbidity.  The patient provides full informed consent for the procedure as described and all questions were answered.   I spent in excess of 90 minutes during the conduct of this office consultation and >50% of this time involved direct face-to-face encounter with the patient for counseling and/or coordination of their care.    Valentina Gu. Roxy Manns, MD 11/05/2018 11:20 AM

## 2018-11-05 NOTE — Patient Instructions (Signed)
   Continue taking all current medications without change through the day before surgery.  Have nothing to eat or drink after midnight the night before surgery.  On the morning of surgery do not take any medications   

## 2018-11-06 NOTE — Pre-Procedure Instructions (Signed)
NATOSHIA SOUTER  11/06/2018      [id number] [address], [place] [address] Phone: [phone] Fax: [phone]    Your procedure is scheduled on March 10  Report to [place] at [email or web address].M.  Call this number if you have problems the morning of surgery:  [phone]   Remember:  Do not eat or drink after midnight.    There are NO medications that you need to take the morning of surgery  Continue all medications as prescribed until the morning of surgery  7 days prior to surgery STOP taking any Aspirin (unless otherwise instructed by your surgeon), Aleve, Naproxen, Ibuprofen, Motrin, Advil, Goody's, BC's, all herbal medications, fish oil, and all vitamins.      Do not wear jewelry, make-up or nail polish.  Do not wear lotions, powders, or perfumes, or deodorant.  Do not shave 48 hours prior to surgery.   Do not bring valuables to the hospital.  [facility] is not responsible for any belongings or valuables.  Contacts, dentures or bridgework may not be worn into surgery.  Leave your suitcase in the car.  After surgery it may be brought to your room.  For patients admitted to the hospital, discharge time will be determined by your treatment team.  Patients discharged the day of surgery will not be allowed to drive home.    Special instructions:   [place]- Preparing For Surgery  Before surgery, you can play an important role. Because skin is not sterile, your skin needs to be as free of germs as possible. You can reduce the number of germs on your skin by washing with CHG (chlorahexidine gluconate) Soap before surgery.  CHG is an antiseptic cleaner which kills germs and bonds with the skin to continue killing germs even after washing.    Oral Hygiene is also important to reduce your risk of infection.  Remember - BRUSH YOUR TEETH THE MORNING OF SURGERY WITH YOUR REGULAR TOOTHPASTE  Please do not use if  you have an allergy to CHG or antibacterial soaps. If your skin becomes reddened/irritated stop using the CHG.  Do not shave (including legs and underarms) for at least 48 hours prior to first CHG shower. It is OK to shave your face.  Please follow these instructions carefully.   1. Shower the NIGHT BEFORE SURGERY and the MORNING OF SURGERY with CHG.   2. If you chose to wash your hair, wash your hair first as usual with your normal shampoo.  3. After you shampoo, rinse your hair and body thoroughly to remove the shampoo.  4. Use CHG as you would any other liquid soap. You can apply CHG directly to the skin and wash gently with a scrungie or a clean washcloth.   5. Apply the CHG Soap to your body ONLY FROM THE NECK DOWN.  Do not use on open wounds or open sores. Avoid contact with your eyes, ears, mouth and genitals (private parts). Wash Face and genitals (private parts)  with your normal soap.  6. Wash thoroughly, paying special attention to the area where your surgery will be performed.  7. Thoroughly rinse your body with warm water from the neck down.  8. DO NOT shower/wash with your normal soap after using and rinsing off the CHG Soap.  9. Pat yourself dry with a CLEAN TOWEL.  10. Wear CLEAN PAJAMAS to bed the night before surgery, wear comfortable  clothes the morning of surgery  11. Place CLEAN SHEETS on your bed the night of your first shower and DO NOT SLEEP WITH PETS.    Day of Surgery:  Do not apply any deodorants/lotions.  Please wear clean clothes to the hospital/surgery center.   Remember to brush your teeth WITH YOUR REGULAR TOOTHPASTE.    Please read over the following fact sheets that you were given.

## 2018-11-09 ENCOUNTER — Encounter (HOSPITAL_COMMUNITY)
Admission: RE | Admit: 2018-11-09 | Discharge: 2018-11-09 | Disposition: A | Discharge status: Home / Self Care | Payer: PPO | Source: Ambulatory Visit | Attending: Cardiovascular Disease | Admitting: Cardiovascular Disease

## 2018-11-09 ENCOUNTER — Encounter (HOSPITAL_COMMUNITY): Payer: Self-pay

## 2018-11-09 ENCOUNTER — Ambulatory Visit (HOSPITAL_COMMUNITY)
Admission: RE | Admit: 2018-11-09 | Discharge: 2018-11-09 | Disposition: A | Discharge status: Home / Self Care | Payer: PPO | Source: Ambulatory Visit | Attending: Cardiovascular Disease | Admitting: Cardiovascular Disease

## 2018-11-09 ENCOUNTER — Other Ambulatory Visit: Payer: Self-pay

## 2018-11-09 DIAGNOSIS — Z833 Family history of diabetes mellitus: Secondary | ICD-10-CM | POA: Diagnosis not present

## 2018-11-09 DIAGNOSIS — Q231 Congenital insufficiency of aortic valve: Secondary | ICD-10-CM | POA: Diagnosis not present

## 2018-11-09 DIAGNOSIS — I5022 Chronic systolic (congestive) heart failure: Secondary | ICD-10-CM | POA: Diagnosis not present

## 2018-11-09 DIAGNOSIS — I2510 Atherosclerotic heart disease of native coronary artery without angina pectoris: Secondary | ICD-10-CM | POA: Diagnosis present

## 2018-11-09 DIAGNOSIS — M1990 Unspecified osteoarthritis, unspecified site: Secondary | ICD-10-CM | POA: Diagnosis not present

## 2018-11-09 DIAGNOSIS — Z8041 Family history of malignant neoplasm of ovary: Secondary | ICD-10-CM | POA: Diagnosis not present

## 2018-11-09 DIAGNOSIS — Z952 Presence of prosthetic heart valve: Secondary | ICD-10-CM | POA: Diagnosis not present

## 2018-11-09 DIAGNOSIS — Z801 Family history of malignant neoplasm of trachea, bronchus and lung: Secondary | ICD-10-CM | POA: Diagnosis not present

## 2018-11-09 DIAGNOSIS — Z7982 Long term (current) use of aspirin: Secondary | ICD-10-CM | POA: Diagnosis not present

## 2018-11-09 DIAGNOSIS — I442 Atrioventricular block, complete: Secondary | ICD-10-CM | POA: Diagnosis present

## 2018-11-09 DIAGNOSIS — Z01818 Encounter for other preprocedural examination: Secondary | ICD-10-CM | POA: Diagnosis not present

## 2018-11-09 DIAGNOSIS — I350 Nonrheumatic aortic (valve) stenosis: Secondary | ICD-10-CM

## 2018-11-09 DIAGNOSIS — I083 Combined rheumatic disorders of mitral, aortic and tricuspid valves: Secondary | ICD-10-CM | POA: Diagnosis present

## 2018-11-09 DIAGNOSIS — I5043 Acute on chronic combined systolic (congestive) and diastolic (congestive) heart failure: Secondary | ICD-10-CM | POA: Diagnosis not present

## 2018-11-09 DIAGNOSIS — J90 Pleural effusion, not elsewhere classified: Secondary | ICD-10-CM | POA: Diagnosis not present

## 2018-11-09 DIAGNOSIS — Z79890 Hormone replacement therapy (postmenopausal): Secondary | ICD-10-CM | POA: Diagnosis not present

## 2018-11-09 DIAGNOSIS — I428 Other cardiomyopathies: Secondary | ICD-10-CM | POA: Diagnosis present

## 2018-11-09 DIAGNOSIS — Z006 Encounter for examination for normal comparison and control in clinical research program: Secondary | ICD-10-CM | POA: Diagnosis not present

## 2018-11-09 DIAGNOSIS — I452 Bifascicular block: Secondary | ICD-10-CM | POA: Diagnosis present

## 2018-11-09 DIAGNOSIS — Z96643 Presence of artificial hip joint, bilateral: Secondary | ICD-10-CM | POA: Diagnosis present

## 2018-11-09 DIAGNOSIS — Z8249 Family history of ischemic heart disease and other diseases of the circulatory system: Secondary | ICD-10-CM | POA: Diagnosis not present

## 2018-11-09 DIAGNOSIS — E785 Hyperlipidemia, unspecified: Secondary | ICD-10-CM | POA: Diagnosis present

## 2018-11-09 DIAGNOSIS — I509 Heart failure, unspecified: Secondary | ICD-10-CM | POA: Diagnosis not present

## 2018-11-09 LAB — CBC
HEMATOCRIT: 43.2 % (ref 36.0–46.0)
HEMOGLOBIN: 14 g/dL (ref 12.0–15.0)
MCH: 32.6 pg (ref 26.0–34.0)
MCHC: 32.4 g/dL (ref 30.0–36.0)
MCV: 100.5 fL — ABNORMAL HIGH (ref 80.0–100.0)
Platelets: 138 10*3/uL — ABNORMAL LOW (ref 150–400)
RBC: 4.3 MIL/uL (ref 3.87–5.11)
RDW: 13.2 % (ref 11.5–15.5)
WBC: 4.5 10*3/uL (ref 4.0–10.5)
nRBC: 0 % (ref 0.0–0.2)

## 2018-11-09 LAB — COMPREHENSIVE METABOLIC PANEL
ALT: 15 U/L (ref 0–44)
AST: 24 U/L (ref 15–41)
Albumin: 4 g/dL (ref 3.5–5.0)
Alkaline Phosphatase: 59 U/L (ref 38–126)
Anion gap: 12 (ref 5–15)
BILIRUBIN TOTAL: 0.7 mg/dL (ref 0.3–1.2)
BUN: 21 mg/dL (ref 8–23)
CO2: 19 mmol/L — ABNORMAL LOW (ref 22–32)
CREATININE: 1.02 mg/dL — AB (ref 0.44–1.00)
Calcium: 9.6 mg/dL (ref 8.9–10.3)
Chloride: 107 mmol/L (ref 98–111)
GFR calc Af Amer: >60 mL/min (ref >60)
GFR calc non Af Amer: 54 mL/min — ABNORMAL LOW (ref >60)
Glucose, Bld: 106 mg/dL — ABNORMAL HIGH (ref 70–99)
Potassium: 4.7 mmol/L (ref 3.5–5.1)
Sodium: 138 mmol/L (ref 135–145)
Total Protein: 7.1 g/dL (ref 6.5–8.1)

## 2018-11-09 LAB — HEMOGLOBIN A1C
Hgb A1c MFr Bld: 5.9 % — ABNORMAL HIGH (ref 4.8–5.6)
Mean Plasma Glucose: 122.63 mg/dL

## 2018-11-09 LAB — BRAIN NATRIURETIC PEPTIDE: B Natriuretic Peptide: 570.3 pg/mL — ABNORMAL HIGH (ref 0.0–100.0)

## 2018-11-09 LAB — SURGICAL PCR SCREEN
MRSA, PCR: NEGATIVE
Staphylococcus aureus: NEGATIVE

## 2018-11-09 LAB — BLOOD GAS, ARTERIAL
Acid-Base Excess: 0.8 mmol/L (ref 0.0–2.0)
Bicarbonate: 24.2 mmol/L (ref 20.0–28.0)
Drawn by: 470591
FIO2: 21
O2 Saturation: 99.2 %
Patient temperature: 98.6
pCO2 arterial: 34.2 mmHg (ref 32.0–48.0)
pH, Arterial: 7.464 — ABNORMAL HIGH (ref 7.350–7.450)
pO2, Arterial: 160 mmHg — ABNORMAL HIGH (ref 83.0–108.0)

## 2018-11-09 LAB — URINALYSIS, ROUTINE W REFLEX MICROSCOPIC
Bilirubin Urine: NEGATIVE
Glucose, UA: NEGATIVE mg/dL
Hgb urine dipstick: NEGATIVE
Ketones, ur: NEGATIVE mg/dL
Leukocytes,Ua: NEGATIVE
Nitrite: NEGATIVE
PROTEIN: NEGATIVE mg/dL
Specific Gravity, Urine: 1.008 (ref 1.005–1.030)
pH: 5 (ref 5.0–8.0)

## 2018-11-09 LAB — TYPE AND SCREEN
ABO/RH(D): O POS
Antibody Screen: NEGATIVE

## 2018-11-09 LAB — PROTIME-INR
INR: 0.9 (ref 0.8–1.2)
Prothrombin Time: 12.3 seconds (ref 11.4–15.2)

## 2018-11-09 LAB — APTT: aPTT: 32 seconds (ref 24–36)

## 2018-11-09 LAB — ABO/RH: ABO/RH(D): O POS

## 2018-11-09 MED ORDER — SODIUM CHLORIDE 0.9 % IV SOLN
INTRAVENOUS | Status: DC
  Filled 2018-11-09: qty 30

## 2018-11-09 MED ORDER — POTASSIUM CHLORIDE 2 MEQ/ML IV SOLN
80.0000 meq | INTRAVENOUS | Status: DC
  Filled 2018-11-09: qty 40

## 2018-11-09 MED ORDER — DEXMEDETOMIDINE HCL IN NACL 400 MCG/100ML IV SOLN
0.1000 ug/kg/h | INTRAVENOUS | Status: AC
  Administered 2018-11-10: 1 ug/kg/h via INTRAVENOUS
  Filled 2018-11-09: qty 100

## 2018-11-09 MED ORDER — NOREPINEPHRINE BITARTRATE 1 MG/ML IV SOLN
0.0000 ug/min | INTRAVENOUS | Status: DC
  Filled 2018-11-09: qty 4

## 2018-11-09 MED ORDER — SODIUM CHLORIDE 0.9 % IV SOLN
1.5000 g | INTRAVENOUS | Status: AC
  Administered 2018-11-10: 1.5 g via INTRAVENOUS
  Filled 2018-11-09: qty 1.5

## 2018-11-09 MED ORDER — VANCOMYCIN HCL 10 G IV SOLR
1250.0000 mg | INTRAVENOUS | Status: AC
  Administered 2018-11-10: 1250 mg via INTRAVENOUS
  Filled 2018-11-09: qty 1250

## 2018-11-09 MED ORDER — MAGNESIUM SULFATE 50 % IJ SOLN
40.0000 meq | INTRAMUSCULAR | Status: DC
  Filled 2018-11-09: qty 9.85

## 2018-11-09 NOTE — Progress Notes (Signed)
PCP - Charles Alan [name] Cardiologist - [name]  Chest x-ray - 11/09/18 EKG - 10/16/18 Stress Test - denies ECHO - 10/07/18 Cardiac Cath - 10/23/18  Aspirin Instructions: continue but do not take the morning of surgery  Anesthesia review: yes, hx CAD  Patient denies shortness of breath, fever, cough and chest pain at PAT appointment   Patient verbalized understanding of instructions that were given to them at the PAT appointment. Patient was also instructed that they will need to review over the PAT instructions again at home before surgery.

## 2018-11-10 ENCOUNTER — Inpatient Hospital Stay (HOSPITAL_COMMUNITY): Payer: PPO | Admitting: Anesthesiology

## 2018-11-10 ENCOUNTER — Inpatient Hospital Stay (HOSPITAL_COMMUNITY): Payer: PPO

## 2018-11-10 ENCOUNTER — Inpatient Hospital Stay (HOSPITAL_COMMUNITY): Admit: 2018-11-10 | Payer: PPO | Admitting: Cardiovascular Disease

## 2018-11-10 ENCOUNTER — Encounter (HOSPITAL_COMMUNITY)
Admission: RE | Disposition: A | Discharge status: Home / Self Care | Payer: Self-pay | Source: Home / Self Care | Attending: Thoracic Surgery (Cardiothoracic Vascular Surgery)

## 2018-11-10 ENCOUNTER — Encounter (HOSPITAL_COMMUNITY): Payer: Self-pay | Admitting: *Deleted

## 2018-11-10 ENCOUNTER — Inpatient Hospital Stay (HOSPITAL_COMMUNITY): Payer: PPO | Admitting: Physician Assistant

## 2018-11-10 ENCOUNTER — Inpatient Hospital Stay (HOSPITAL_COMMUNITY)
Admission: RE | Admit: 2018-11-10 | Discharge: 2018-11-12 | DRG: 266 | Disposition: A | Discharge status: Home / Self Care | Payer: PPO | Attending: Thoracic Surgery (Cardiothoracic Vascular Surgery) | Admitting: Thoracic Surgery (Cardiothoracic Vascular Surgery)

## 2018-11-10 DIAGNOSIS — I5043 Acute on chronic combined systolic (congestive) and diastolic (congestive) heart failure: Secondary | ICD-10-CM | POA: Diagnosis not present

## 2018-11-10 DIAGNOSIS — Z801 Family history of malignant neoplasm of trachea, bronchus and lung: Secondary | ICD-10-CM

## 2018-11-10 DIAGNOSIS — Q231 Congenital insufficiency of aortic valve: Secondary | ICD-10-CM | POA: Diagnosis not present

## 2018-11-10 DIAGNOSIS — Z8041 Family history of malignant neoplasm of ovary: Secondary | ICD-10-CM | POA: Diagnosis not present

## 2018-11-10 DIAGNOSIS — Z8249 Family history of ischemic heart disease and other diseases of the circulatory system: Secondary | ICD-10-CM | POA: Diagnosis not present

## 2018-11-10 DIAGNOSIS — M169 Osteoarthritis of hip, unspecified: Secondary | ICD-10-CM | POA: Diagnosis present

## 2018-11-10 DIAGNOSIS — Z7982 Long term (current) use of aspirin: Secondary | ICD-10-CM | POA: Diagnosis not present

## 2018-11-10 DIAGNOSIS — Z96643 Presence of artificial hip joint, bilateral: Secondary | ICD-10-CM | POA: Diagnosis present

## 2018-11-10 DIAGNOSIS — I428 Other cardiomyopathies: Secondary | ICD-10-CM | POA: Diagnosis present

## 2018-11-10 DIAGNOSIS — I452 Bifascicular block: Secondary | ICD-10-CM | POA: Diagnosis present

## 2018-11-10 DIAGNOSIS — I350 Nonrheumatic aortic (valve) stenosis: Secondary | ICD-10-CM

## 2018-11-10 DIAGNOSIS — I509 Heart failure, unspecified: Secondary | ICD-10-CM | POA: Diagnosis not present

## 2018-11-10 DIAGNOSIS — I083 Combined rheumatic disorders of mitral, aortic and tricuspid valves: Secondary | ICD-10-CM | POA: Diagnosis present

## 2018-11-10 DIAGNOSIS — Z006 Encounter for examination for normal comparison and control in clinical research program: Secondary | ICD-10-CM | POA: Diagnosis not present

## 2018-11-10 DIAGNOSIS — I2510 Atherosclerotic heart disease of native coronary artery without angina pectoris: Secondary | ICD-10-CM | POA: Diagnosis present

## 2018-11-10 DIAGNOSIS — I4510 Unspecified right bundle-branch block: Secondary | ICD-10-CM

## 2018-11-10 DIAGNOSIS — I442 Atrioventricular block, complete: Secondary | ICD-10-CM | POA: Diagnosis present

## 2018-11-10 DIAGNOSIS — E785 Hyperlipidemia, unspecified: Secondary | ICD-10-CM | POA: Diagnosis present

## 2018-11-10 DIAGNOSIS — M1990 Unspecified osteoarthritis, unspecified site: Secondary | ICD-10-CM | POA: Diagnosis not present

## 2018-11-10 DIAGNOSIS — I340 Nonrheumatic mitral (valve) insufficiency: Secondary | ICD-10-CM | POA: Diagnosis present

## 2018-11-10 DIAGNOSIS — Z79890 Hormone replacement therapy (postmenopausal): Secondary | ICD-10-CM

## 2018-11-10 DIAGNOSIS — Z833 Family history of diabetes mellitus: Secondary | ICD-10-CM

## 2018-11-10 DIAGNOSIS — I5022 Chronic systolic (congestive) heart failure: Secondary | ICD-10-CM | POA: Diagnosis not present

## 2018-11-10 DIAGNOSIS — Z950 Presence of cardiac pacemaker: Secondary | ICD-10-CM

## 2018-11-10 DIAGNOSIS — Z952 Presence of prosthetic heart valve: Secondary | ICD-10-CM

## 2018-11-10 HISTORY — DX: Hyperlipidemia, unspecified: E78.5

## 2018-11-10 HISTORY — DX: Unspecified right bundle-branch block: I45.10

## 2018-11-10 HISTORY — DX: Presence of prosthetic heart valve: Z95.2

## 2018-11-10 HISTORY — PX: TEE WITHOUT CARDIOVERSION: SHX5443

## 2018-11-10 HISTORY — DX: Nonrheumatic aortic (valve) stenosis: I35.0

## 2018-11-10 HISTORY — PX: TRANSCATHETER AORTIC VALVE REPLACEMENT, TRANSFEMORAL: SHX6400

## 2018-11-10 HISTORY — DX: Chronic combined systolic (congestive) and diastolic (congestive) heart failure: I50.42

## 2018-11-10 LAB — POCT I-STAT 4, (NA,K, GLUC, HGB,HCT)
Glucose, Bld: 98 mg/dL (ref 70–99)
Glucose, Bld: 105 mg/dL — ABNORMAL HIGH (ref 70–99)
HCT: 33 % — ABNORMAL LOW (ref 36.0–46.0)
HEMATOCRIT: 37 % (ref 36.0–46.0)
Hemoglobin: 12.6 g/dL (ref 12.0–15.0)
Hemoglobin: 11.2 g/dL — ABNORMAL LOW (ref 12.0–15.0)
Potassium: 4.1 mmol/L (ref 3.5–5.1)
Potassium: 4 mmol/L (ref 3.5–5.1)
Sodium: 139 mmol/L (ref 135–145)
Sodium: 141 mmol/L (ref 135–145)

## 2018-11-10 LAB — POCT ACTIVATED CLOTTING TIME
Activated Clotting Time: 136 seconds
Activated Clotting Time: 268 seconds
Activated Clotting Time: 164 seconds

## 2018-11-10 LAB — GLUCOSE, CAPILLARY: Glucose-Capillary: 196 mg/dL — ABNORMAL HIGH (ref 70–99)

## 2018-11-10 SURGERY — IMPLANTATION, AORTIC VALVE, TRANSCATHETER, FEMORAL APPROACH
Anesthesia: Monitor Anesthesia Care | Site: Chest

## 2018-11-10 SURGERY — IMPLANTATION, AORTIC VALVE, TRANSCATHETER, FEMORAL APPROACH
Anesthesia: Monitor Anesthesia Care

## 2018-11-10 MED ORDER — CLOPIDOGREL BISULFATE 75 MG PO TABS
75.0000 mg | ORAL_TABLET | Freq: Every day | ORAL | Status: DC
  Administered 2018-11-11: 75 mg via ORAL
  Filled 2018-11-10: qty 1

## 2018-11-10 MED ORDER — ONDANSETRON HCL 4 MG/2ML IJ SOLN
4.0000 mg | Freq: Four times a day (QID) | INTRAMUSCULAR | Status: DC | PRN
  Filled 2018-11-10: qty 2

## 2018-11-10 MED ORDER — SODIUM CHLORIDE 0.9% FLUSH
3.0000 mL | Freq: Two times a day (BID) | INTRAVENOUS | Status: DC
  Administered 2018-11-10 – 2018-11-12 (×3): 3 mL via INTRAVENOUS

## 2018-11-10 MED ORDER — SODIUM CHLORIDE 0.9% FLUSH: 3.0000 mL | INTRAVENOUS | Status: DC | PRN

## 2018-11-10 MED ORDER — IOPAMIDOL (ISOVUE-370) INJECTION 76%
INTRAVENOUS | Status: DC | PRN
  Administered 2018-11-10: 75 mL via INTRA_ARTERIAL

## 2018-11-10 MED ORDER — LACTATED RINGERS IV SOLN
INTRAVENOUS | Status: DC | PRN
  Administered 2018-11-10: 10:00:00 via INTRAVENOUS

## 2018-11-10 MED ORDER — CHLORHEXIDINE GLUCONATE 0.12 % MT SOLN
15.0000 mL | Freq: Once | OROMUCOSAL | Status: AC
  Administered 2018-11-10: 15 mL via OROMUCOSAL
  Filled 2018-11-10: qty 15

## 2018-11-10 MED ORDER — METOPROLOL TARTRATE 5 MG/5ML IV SOLN: 2.5000 mg | INTRAVENOUS | Status: DC | PRN

## 2018-11-10 MED ORDER — LIDOCAINE HCL (PF) 1 % IJ SOLN
INTRAMUSCULAR | Status: DC | PRN
  Administered 2018-11-10: 15 mL
  Administered 2018-11-10: 10 mL

## 2018-11-10 MED ORDER — HEPARIN SODIUM (PORCINE) 1000 UNIT/ML IJ SOLN
INTRAMUSCULAR | Status: DC | PRN
  Administered 2018-11-10: 10000 [IU] via INTRAVENOUS

## 2018-11-10 MED ORDER — CHLORHEXIDINE GLUCONATE 4 % EX LIQD
Freq: Once | CUTANEOUS | Status: AC
  Administered 2018-11-11: 03:00:00 via TOPICAL
  Filled 2018-11-10: qty 45

## 2018-11-10 MED ORDER — ASPIRIN 81 MG PO CHEW
81.0000 mg | CHEWABLE_TABLET | Freq: Every day | ORAL | Status: DC
  Administered 2018-11-11: 81 mg via ORAL
  Filled 2018-11-10: qty 1

## 2018-11-10 MED ORDER — DEXAMETHASONE SODIUM PHOSPHATE 10 MG/ML IJ SOLN
INTRAMUSCULAR | Status: DC | PRN
  Administered 2018-11-10: 5 mg via INTRAVENOUS

## 2018-11-10 MED ORDER — CHLORHEXIDINE GLUCONATE 0.12 % MT SOLN
OROMUCOSAL | Status: AC
  Administered 2018-11-10: 15 mL via OROMUCOSAL
  Filled 2018-11-10: qty 15

## 2018-11-10 MED ORDER — ACETAMINOPHEN 500 MG PO TABS
1000.0000 mg | ORAL_TABLET | Freq: Once | ORAL | Status: AC
  Administered 2018-11-10: 1000 mg via ORAL
  Filled 2018-11-10: qty 2

## 2018-11-10 MED ORDER — MORPHINE SULFATE (PF) 10 MG/ML IV SOLN: 1.0000 mg | INTRAVENOUS | Status: DC | PRN

## 2018-11-10 MED ORDER — HEPARIN (PORCINE) IN NACL 1000-0.9 UT/500ML-% IV SOLN
INTRAVENOUS | Status: DC | PRN
  Administered 2018-11-10 (×3): 500 mL

## 2018-11-10 MED ORDER — MORPHINE SULFATE (PF) 2 MG/ML IV SOLN
1.0000 mg | INTRAVENOUS | Status: DC | PRN
  Administered 2018-11-11: 2 mg via INTRAVENOUS
  Filled 2018-11-10: qty 1

## 2018-11-10 MED ORDER — LACTATED RINGERS IV SOLN
INTRAVENOUS | Status: DC | PRN
  Administered 2018-11-10: 09:00:00 via INTRAVENOUS

## 2018-11-10 MED ORDER — PHENYLEPHRINE HCL-NACL 20-0.9 MG/250ML-% IV SOLN: 0.0000 ug/min | INTRAVENOUS | Status: DC

## 2018-11-10 MED ORDER — SODIUM CHLORIDE 0.9 % IV SOLN
1.5000 g | Freq: Two times a day (BID) | INTRAVENOUS | Status: AC
  Administered 2018-11-10 – 2018-11-12 (×4): 1.5 g via INTRAVENOUS
  Filled 2018-11-10 (×4): qty 1.5

## 2018-11-10 MED ORDER — ZOLPIDEM TARTRATE 5 MG PO TABS
5.0000 mg | ORAL_TABLET | Freq: Every evening | ORAL | Status: DC | PRN
  Administered 2018-11-11 (×2): 5 mg via ORAL
  Filled 2018-11-10 (×2): qty 1

## 2018-11-10 MED ORDER — VANCOMYCIN HCL IN DEXTROSE 1-5 GM/200ML-% IV SOLN
1000.0000 mg | Freq: Once | INTRAVENOUS | Status: AC
  Administered 2018-11-11: 1000 mg via INTRAVENOUS
  Filled 2018-11-10: qty 200

## 2018-11-10 MED ORDER — CHLORHEXIDINE GLUCONATE 4 % EX LIQD
30.0000 mL | CUTANEOUS | Status: DC
  Filled 2018-11-10: qty 30

## 2018-11-10 MED ORDER — IOPAMIDOL (ISOVUE-370) INJECTION 76%
INTRAVENOUS | Status: AC
  Filled 2018-11-10: qty 125

## 2018-11-10 MED ORDER — PROPOFOL 500 MG/50ML IV EMUL
INTRAVENOUS | Status: DC | PRN
  Administered 2018-11-10: 10 ug/kg/min via INTRAVENOUS

## 2018-11-10 MED ORDER — ACETAMINOPHEN 325 MG PO TABS
650.0000 mg | ORAL_TABLET | Freq: Four times a day (QID) | ORAL | Status: DC | PRN
  Administered 2018-11-11: 650 mg via ORAL
  Filled 2018-11-10: qty 2

## 2018-11-10 MED ORDER — ACETAMINOPHEN 650 MG RE SUPP: 650.0000 mg | Freq: Four times a day (QID) | RECTAL | Status: DC | PRN

## 2018-11-10 MED ORDER — SODIUM CHLORIDE 0.9 % IV SOLN
250.0000 mL | INTRAVENOUS | Status: DC | PRN
  Administered 2018-11-11: 250 mL via INTRAVENOUS

## 2018-11-10 MED ORDER — NITROGLYCERIN IN D5W 200-5 MCG/ML-% IV SOLN: 0.0000 ug/min | INTRAVENOUS | Status: DC

## 2018-11-10 MED ORDER — SODIUM CHLORIDE 0.9 % IV SOLN
INTRAVENOUS | Status: AC
  Administered 2018-11-10: 15:00:00 via INTRAVENOUS

## 2018-11-10 MED ORDER — OXYCODONE HCL 5 MG PO TABS
5.0000 mg | ORAL_TABLET | ORAL | Status: DC | PRN
  Administered 2018-11-12: 10 mg via ORAL
  Filled 2018-11-10: qty 2

## 2018-11-10 MED ORDER — LIDOCAINE HCL (PF) 1 % IJ SOLN
INTRAMUSCULAR | Status: AC
  Filled 2018-11-10: qty 30

## 2018-11-10 MED ORDER — TRAMADOL HCL 50 MG PO TABS
50.0000 mg | ORAL_TABLET | ORAL | Status: DC | PRN
  Administered 2018-11-11: 100 mg via ORAL
  Filled 2018-11-10 (×2): qty 2

## 2018-11-10 MED ORDER — CHLORHEXIDINE GLUCONATE 4 % EX LIQD: 60.0000 mL | Freq: Once | CUTANEOUS | Status: DC

## 2018-11-10 MED ORDER — ONDANSETRON HCL 4 MG/2ML IJ SOLN
INTRAMUSCULAR | Status: DC | PRN
  Administered 2018-11-10: 4 mg via INTRAVENOUS

## 2018-11-10 MED ORDER — PROTAMINE SULFATE 10 MG/ML IV SOLN
INTRAVENOUS | Status: DC | PRN
  Administered 2018-11-10: 100 mg via INTRAVENOUS

## 2018-11-10 MED ORDER — HEPARIN (PORCINE) IN NACL 1000-0.9 UT/500ML-% IV SOLN
INTRAVENOUS | Status: AC
  Filled 2018-11-10: qty 1500

## 2018-11-10 MED ORDER — SODIUM CHLORIDE 0.9 % IV SOLN
INTRAVENOUS | Status: DC | PRN
  Administered 2018-11-10: 20 ug/min via INTRAVENOUS

## 2018-11-10 MED ORDER — LIDOCAINE HCL (PF) 1 % IJ SOLN
INTRAMUSCULAR | Status: DC | PRN
  Administered 2018-11-10: 5 mL via INTRADERMAL

## 2018-11-10 MED ORDER — FAMOTIDINE 20 MG PO TABS
10.0000 mg | ORAL_TABLET | Freq: Every day | ORAL | Status: DC | PRN
  Administered 2018-11-12: 10 mg via ORAL
  Filled 2018-11-10 (×2): qty 1

## 2018-11-10 MED ORDER — SODIUM CHLORIDE 0.9 % IV SOLN
INTRAVENOUS | Status: DC
  Administered 2018-11-10: 10:00:00 via INTRAVENOUS

## 2018-11-10 SURGICAL SUPPLY — 32 items
BAG SNAP BAND KOVER 36X36 (MISCELLANEOUS) ×4 IMPLANT
BLANKET WARM UNDERBOD FULL ACC (MISCELLANEOUS) ×2 IMPLANT
CABLE ADAPT PACING TEMP 12FT (ADAPTER) ×2 IMPLANT
CATH 26 EDWARDS DELIVERY SYS (CATHETERS) ×2 IMPLANT
CATH DIAG 6FR PIGTAIL ANGLED (CATHETERS) ×4 IMPLANT
CATH INFINITI 6F AL1 (CATHETERS) ×2 IMPLANT
CATH S G BIP PACING (CATHETERS) ×2 IMPLANT
CATH TEMPO TEMP PACE LEAD CRVE (CATHETERS) ×2 IMPLANT
CLOSURE MYNX CONTROL 6F/7F (Vascular Products) ×2 IMPLANT
CRIMPER (MISCELLANEOUS) ×2 IMPLANT
DEVICE CLOSURE PERCLS PRGLD 6F (VASCULAR PRODUCTS) ×2 IMPLANT
DEVICE INFLATION ATRION QL2530 (MISCELLANEOUS) ×2 IMPLANT
GUIDEWIRE SAFE TJ AMPLATZ EXST (WIRE) ×2 IMPLANT
KIT HEART LEFT (KITS) ×2 IMPLANT
KIT MICROPUNCTURE NIT STIFF (SHEATH) ×2 IMPLANT
PACK CARDIAC CATHETERIZATION (CUSTOM PROCEDURE TRAY) ×2 IMPLANT
PERCLOSE PROGLIDE 6F (VASCULAR PRODUCTS) ×4
SHEATH 14X36 EDWARDS (SHEATH) ×2 IMPLANT
SHEATH BRITE TIP 6FR 35CM (SHEATH) ×2 IMPLANT
SHEATH PINNACLE 6F 10CM (SHEATH) ×4 IMPLANT
SHEATH PINNACLE 8F 10CM (SHEATH) ×6 IMPLANT
SHEATH PROBE COVER 6X72 (BAG) ×2 IMPLANT
SLEEVE REPOSITIONING LENGTH 30 (MISCELLANEOUS) ×2 IMPLANT
STOPCOCK MORSE 400PSI 3WAY (MISCELLANEOUS) ×4 IMPLANT
TRANSDUCER W/STOPCOCK (MISCELLANEOUS) ×4 IMPLANT
TUBE CONN 8.8X1320 FR HP M-F (CONNECTOR) ×6 IMPLANT
TUBING CIL FLEX 10 FLL-RA (TUBING) ×2 IMPLANT
VALVE HEART TRANSCATH SZ3 26MM (Valve) ×2 IMPLANT
WIRE AMPLATZ SS-J .035X180CM (WIRE) ×2 IMPLANT
WIRE EMERALD 3MM-J .035X150CM (WIRE) ×2 IMPLANT
WIRE EMERALD 3MM-J .035X260CM (WIRE) ×2 IMPLANT
WIRE EMERALD ST .035X260CM (WIRE) ×2 IMPLANT

## 2018-11-10 NOTE — Op Note (Signed)
HEART AND VASCULAR CENTER   MULTIDISCIPLINARY HEART VALVE TEAM   TAVR OPERATIVE NOTE   Date of Procedure:  11/10/2018  Preoperative Diagnosis: Severe Aortic Stenosis   Postoperative Diagnosis: Same   Procedure:    Transcatheter Aortic Valve Replacement - Percutaneous Right Transfemoral Approach  Edwards Sapien 3 THV (size 26 mm, model # 9600TFX, serial # [id number])   Co-Surgeons:  Valentina Gu. Roxy Manns, MD and Sherren Mocha, MD  Anesthesiologist:  Adele Barthel, MD  Echocardiographer:  Ena Dawley, MD  Pre-operative Echo Findings:  Severe aortic stenosis  Moderate left ventricular systolic dysfunction  Post-operative Echo Findings:  Mild paravalvular leak  Unchanged left ventricular systolic function   BRIEF CLINICAL NOTE AND INDICATIONS FOR SURGERY  Patient is a 73 year old female with bicuspid aortic valve with aortic stenosis, mitral regurgitation, chronic combined systolic and diastolic congestive heart failure, RBBB, hyperlipidemia, and degenerative arthritis who has been referred for surgical consultation to discuss treatment options for management of aortic stenosis.  Patient states that she has been told that she had a heart murmur since her youth.  She was evaluated when she went to college and told that there might of been question that she had had rheumatic fever as a child.  She has never been seen by a cardiologist until recently.  In May 2019 she developed new onset of symptoms of exertional shortness of breath and chest tightness.  Cardiac troponins were negative and EKG revealed baseline chronic right bundle branch block without acute ST or T wave changes.  She underwent a transthoracic echocardiogram that revealed normal left ventricular function with ejection fraction estimated 50 to 55%, moderate aortic stenosis, and mild mitral regurgitation.  The left atrium was severely dilated..  Peak velocity across the aortic valve was reported 2.9 m/s corresponding  to mean transvalvular gradient estimated 21 mmHg and aortic valve area calculated 0.75 cm.  She was hospitalized and evaluated by Dr. [name].  She underwent diagnostic cardiac catheterization by Dr. [name].  She was noted to have mild coronary artery disease with 50% stenosis of the distal left anterior descending coronary artery.  Mean transvalvular gradient across the aortic valve was measured 7 mmHg corresponding to aortic valve area calculated 2.7 cm.  She was discharged home and she states that she was told that no further cardiac follow-up was necessary and she was to follow-up with her primary care physician.  Since that time the patient has continued to complain of exertional shortness of breath.  Previously the patient had remained remarkably active physically.  She enjoys playing tennis and golf and she walks on a regular basis.  She developed progressive symptoms of exertional shortness of breath, orthopnea, and lower extremity edema.  She was evaluated by pulmonologist and found to have no evidence of significant lung disease.  CT scan of the chest revealed mild pulmonary edema.  She asked her primary care physician to refer her back for cardiology consultation and she was evaluated by Dr. [name] October 05, 2018.  Follow-up echocardiogram performed October 07, 2018 revealed significant drop in left ventricular systolic function with ejection fraction estimated only 25 to 30%.  There was felt to be low flow, low ejection fraction, severe aortic stenosis.  Peak velocity across the aortic valve was measured greater than 3.1 m/s corresponding to mean transvalvular gradient estimated 25 mmHg.  The DVI was reported 0.22 with aortic valve area calculated 0.69 cm.  There was reportedly mild to moderate mitral regurgitation although regurgitant fraction and ERO were not reported.  She subsequently underwent repeat diagnostic cardiac catheterization on October 23, 2018.  This confirmed the presence of  mild nonobstructive coronary artery disease.  There was felt to be moderate left ventricular systolic dysfunction with ejection fraction estimated 35 to 45%.  Mean transvalvular gradient across the aortic valve was measured 27 mmHg.  Right heart pressures were normal.  The patient was referred to the multidisciplinary heart valve clinic and has been evaluated previously by Dr. [name].  CT angiography was performed and surgical consultation requested.  During the course of the patient's preoperative work up they have been evaluated comprehensively by a multidisciplinary team of specialists coordinated through the [facility] in the [facility].  They have been demonstrated to suffer from symptomatic severe aortic stenosis as noted above. The patient has been counseled extensively as to the relative risks and benefits of all options for the treatment of severe aortic stenosis including long term medical therapy, conventional surgery for aortic valve replacement, and transcatheter aortic valve replacement.  All questions have been answered, and the patient provides full informed consent for the operation as described.   DETAILS OF THE OPERATIVE PROCEDURE  PREPARATION:    The patient is brought to the operating room on the above mentioned date and central monitoring was established by the anesthesia team including placement of a radial arterial line. The patient is placed in the supine position on the operating table.  Intravenous antibiotics are administered. The patient is monitored closely throughout the procedure under conscious sedation.  Baseline transthoracic echocardiogram was performed. The patient's chest, abdomen, both groins, and both lower extremities are prepared and draped in a sterile manner. A time out procedure is performed.   PERIPHERAL ACCESS:    Initially attempts were made to place a 6 Fr sheath into the right internal jugular vein  for purposes of placing a temporary pacing wire.  However, sheath placement was unsuccessful.  Using the modified Seldinger technique, femoral arterial and venous access was obtained with placement of 6 Fr sheaths on the left side.  A pigtail diagnostic catheter was passed through the left arterial sheath under fluoroscopic guidance into the aortic root.  A TEMPO active fixation temporary transvenous pacemaker catheter was passed through the left femoral venous sheath under fluoroscopic guidance into the right ventricle.  The pacemaker was tested to ensure stable lead placement and pacemaker capture. Aortic root angiography was performed in order to determine the optimal angiographic angle for valve deployment.   TRANSFEMORAL ACCESS:   Percutaneous transfemoral access and sheath placement was performed using ultrasound guidance.  The right common femoral artery was cannulated using a micropuncture needle and appropriate location was verified using hand injection angiogram.  A pair of Abbott Perclose percutaneous closure devices were placed and a 6 French sheath replaced into the femoral artery.  The patient was heparinized systemically and ACT verified > 250 seconds.    A 14 Fr transfemoral E-sheath was introduced into the right common femoral artery after progressively dilating over an Amplatz superstiff wire. An AL-1 catheter was used to direct a straight-tip exchange length wire across the native aortic valve into the left ventricle. This was exchanged out for a pigtail catheter and position was confirmed in the LV apex. Simultaneous LV and Ao pressures were recorded.  The pigtail catheter was exchanged for an Amplatz Extra-stiff wire in the LV apex.  Echocardiography was utilized to confirm appropriate wire position and no sign of entanglement in the mitral subvalvular apparatus.  TRANSCATHETER HEART VALVE DEPLOYMENT:   An Edwards Sapien 3 transcatheter heart valve (size 26 mm, model #9600TFX,  serial #[id number]) was prepared and crimped per manufacturer's guidelines, and the proper orientation of the valve is confirmed on the [employer] delivery system. The valve was advanced through the introducer sheath using normal technique until in an appropriate position in the abdominal aorta beyond the sheath tip. The balloon was then retracted and using the fine-tuning wheel was centered on the valve. The valve was then advanced across the aortic arch using appropriate flexion of the catheter. The valve was carefully positioned across the aortic valve annulus. The Commander catheter was retracted using normal technique. Once final position of the valve has been confirmed by angiographic assessment, the valve is deployed while temporarily holding ventilation and during rapid ventricular pacing to maintain systolic blood pressure < 50 mmHg and pulse pressure < 10 mmHg. The balloon inflation is held for >3 seconds after reaching full deployment volume. Once the balloon has fully deflated the balloon is retracted into the ascending aorta and valve function is assessed using echocardiography. There is felt to be mild paravalvular leak and no central aortic insufficiency.  The patient's hemodynamic recovery following valve deployment is good, although the patient developed complete heart block and remained pacer-dependent.  The deployment balloon and guidewire are both removed.    PROCEDURE COMPLETION:   The sheath was removed and femoral artery closure performed.  Protamine was administered once femoral arterial repair was complete. The pigtail catheters and femoral arterial sheath were removed with manual pressure used for hemostasis. The temporary pacing wire was left in place.   The patient tolerated the procedure well and is transported to the surgical intensive care in stable condition. There were no immediate intraoperative complications. All sponge instrument and needle counts are verified correct  at completion of the operation.   No blood products were administered during the operation.  The patient received a total of 75 mL of intravenous contrast during the procedure.   Rexene Alberts, MD 11/10/2018 1:59 PM

## 2018-11-10 NOTE — Op Note (Addendum)
HEART AND VASCULAR CENTER   MULTIDISCIPLINARY HEART VALVE TEAM   TAVR OPERATIVE NOTE   Date of Procedure:  11/10/2018  Preoperative Diagnosis: Severe Aortic Stenosis   Postoperative Diagnosis: Same   Procedure:    Transcatheter Aortic Valve Replacement - Percutaneous Transfemoral Approach  Edwards Sapien 3 THV (size 26 mm, model # 9600TFX, serial # [id number])   Co-Surgeons:  Valentina Gu. Roxy Manns, MD and Sherren Mocha, MD  Anesthesiologist:  Adele Barthel, MD  Echocardiographer:  Ena Dawley, MD  Pre-operative Echo Findings:  Severe aortic stenosis  Moderate-severe LV systolic dysfunction  Post-operative Echo Findings:  Mild paravalvular leak  Unchanged left ventricular systolic function  BRIEF CLINICAL NOTE AND INDICATIONS FOR SURGERY  Please see the complete note of Dr Roxy Manns for full indications and history  Following the decision to proceed with transcatheter aortic valve replacement, a discussion has been held regarding what types of management strategies would be attempted intraoperatively in the event of life-threatening complications, including whether or not the patient would be considered a candidate for the use of cardiopulmonary bypass and/or conversion to open sternotomy for attempted surgical intervention.  The patient has been advised of a variety of complications that might develop peculiar to this approach including but not limited to risks of death, stroke, paravalvular leak, aortic dissection or other major vascular complications, aortic annulus rupture, device embolization, cardiac rupture or perforation, acute myocardial infarction, arrhythmia, heart block or bradycardia requiring permanent pacemaker placement, congestive heart failure, respiratory failure, renal failure, pneumonia, infection, other late complications related to structural valve deterioration or migration, or other complications that might ultimately cause a temporary or permanent loss of  functional independence or other long term morbidity.  The patient provides full informed consent for the procedure as described and all questions were answered preoperatively.  DETAILS OF THE OPERATIVE PROCEDURE  PREPARATION:   The patient is brought to the operating room on the above mentioned date and central monitoring was established by the anesthesia team including placement of a central venous catheter and radial arterial line. The patient is placed in the supine position on the operating table.  Intravenous antibiotics are administered. The patient is monitored closely throughout the procedure under conscious sedation.  Baseline transthoracic echocardiogram is performed. The patient's chest, abdomen, both groins, and both lower extremities are prepared and draped in a sterile manner. A time out procedure is performed.   PERIPHERAL ACCESS:   Initially an attempt is made at right IJ access for placement of a temporary wire, but this was unsuccessful. Using ultrasound guidance, femoral arterial and venous access is obtained with placement of 6 Fr sheaths on the left side.  A pigtail diagnostic catheter was passed through the femoral arterial sheath under fluoroscopic guidance into the aortic root.  A Tempo active fixation temporary transvenous pacemaker catheter was passed through the femoral venous sheath under fluoroscopic guidance into the right ventricle.  The pacemaker was tested to ensure stable lead placement and pacemaker capture. Aortic root angiography was performed in order to determine the optimal angiographic angle for valve deployment.  TRANSFEMORAL ACCESS:  A micropuncture technique is used to access the right femoral artery under fluoroscopic and ultrasound guidance.  2 Perclose devices are deployed at 10' and 2' positions to 'PreClose' the femoral artery. An 8 French sheath is placed and then an Amplatz Superstiff wire is advanced through the sheath. This is changed out for a 14  French transfemoral E-Sheath after progressively dilating over the Superstiff wire.  An AL-1 catheter was  used to direct a straight-tip exchange length wire across the native aortic valve into the left ventricle. This was exchanged out for a pigtail catheter and position was confirmed in the LV apex. Simultaneous LV and Ao pressures were recorded.  The pigtail catheter was exchanged for an Amplatz Extra-stiff wire in the LV apex.  Echocardiography was utilized to confirm appropriate wire position and no sign of entanglement in the mitral subvalvular apparatus.  BALLOON AORTIC VALVULOPLASTY:  Not performed  TRANSCATHETER HEART VALVE DEPLOYMENT:  An Edwards Sapien 3 transcatheter heart valve (size 26 mm, model #9600TFX, serial #[id number]) was prepared and crimped per manufacturer's guidelines, and the proper orientation of the valve is confirmed on the [employer] delivery system. The valve was advanced through the introducer sheath using normal technique until in an appropriate position in the abdominal aorta beyond the sheath tip. The balloon was then retracted and using the fine-tuning wheel was centered on the valve. The valve was then advanced across the aortic arch using appropriate flexion of the catheter. The valve was carefully positioned across the aortic valve annulus. The Commander catheter was retracted using normal technique. Once final position of the valve has been confirmed by angiographic assessment, the valve is deployed while temporarily holding ventilation and during rapid ventricular pacing to maintain systolic blood pressure < 50 mmHg and pulse pressure < 10 mmHg. The balloon inflation is held for >3 seconds after reaching full deployment volume. Once the balloon has fully deflated the balloon is retracted into the ascending aorta and valve function is assessed using echocardiography. There is felt to be mild paravalvular leak and no central aortic insufficiency.  The patient's  hemodynamic recovery following valve deployment is good.  The deployment balloon and guidewire are both removed. Echo demostrated acceptable post-procedural gradients, stable mitral valve function, and mild aortic insufficiency.   PROCEDURE COMPLETION:  The sheath was removed and femoral artery closure is performed using the 2 previously deployed Perclose devices.  Protamine is administered once femoral arterial repair was complete. The site is clear with no evidence of bleeding or hematoma after the sutures are tightened. The pigtail catheter and femoral arterial sheath was removed with manual pressure used for hemostasis. The pacemaker is left in place.   The patient tolerated the procedure well and is transported to the surgical intensive care in stable condition. There were no immediate intraoperative complications. All sponge instrument and needle counts are verified correct at completion of the operation.   The patient received a total of 75 mL of intravenous contrast during the procedure.   Sherren Mocha, MD 11/10/2018 5:22 PM

## 2018-11-10 NOTE — Progress Notes (Signed)
  [facility] VALVE TEAM  Patient doing well s/p TAVR. She is hemodynamically stable. Groin sites stable; temp wire in left groin. ECG with sinus bradycardia with 1st deg AV block and RBBB. Plan for EP consult and hopeful PPM tomorrow.   Angelena Form PA-C  [facility]  Pager [phone]

## 2018-11-10 NOTE — Anesthesia Postprocedure Evaluation (Signed)
Anesthesia Post Note  Patient: Brittany Shaffer  Procedure(s) Performed: TRANSCATHETER AORTIC VALVE REPLACEMENT, TRANSFEMORAL (N/A ) TRANSESOPHAGEAL ECHOCARDIOGRAM (TEE) (N/A )     Patient location during evaluation: ICU Anesthesia Type: MAC Level of consciousness: awake and alert Pain management: pain level controlled Vital Signs Assessment: post-procedure vital signs reviewed and stable Respiratory status: spontaneous breathing, nonlabored ventilation, respiratory function stable and patient connected to nasal cannula oxygen Cardiovascular status: stable and blood pressure returned to baseline Postop Assessment: no apparent nausea or vomiting Anesthetic complications: no    Last Vitals:  Vitals:   11/10/18 1530 11/10/18 1600  BP: 109/63 (!) 99/58  Pulse: (!) 45 (!) 44  Resp: 13 11  Temp:  (!) 36.2 C  SpO2: 100% 98%    Last Pain:  Vitals:   11/10/18 1600  TempSrc: Axillary  PainSc: 0-No pain                 Tag Wurtz P Magon Croson

## 2018-11-10 NOTE — H&P (View-Only) (Signed)
Cardiology Consultation:   Patient ID: [name] MRN: [id number]; DOB: 12/18/1945  Admit date: 11/10/2018 Date of Consult: 11/10/2018  Primary Care Provider: [name], MD Primary Cardiologist: Dr. [name] Structural Heart: Dr. [name] Primary Electrophysiologist:  None    Patient Profile:   Brittany Shaffer is a 73 y.o. female with a hx of HLD, OA, non-obstructive CAD, NICM, chronic CHF (systolic), RBBB, and VHD w/severe AS who is being seen today for the evaluation of intra-procedure (TAVR) CHB, evaluatein for PPM at the request of Dr. [name].  History of Present Illness:   [name] in review of his record, in May 2019 she presented with chest pain and shortness of breath. An echo showed moderate aortic stenosis and she underwent right and left heart catheterization demonstrating no obstructive CAD, elevated filling pressures, moderate MR, and only mild aortic stenosis. She has continued to experience exertional dyspnea and she underwent formal pulmonary evaluation. She had no evidence of COPD or interstitial lung disease. She was seen in follow-up by Dr [name] about one month ago and underwent a CTA of the chest to evaluate for PE. This demonstrated no PE but showed bilateral pleural effusions and interstitial edema consistent with CHF. An updated echo demonstrated progressive LV systolic dysfunction, now with LVEF approximately 30%.  A repeat heart catheterization demonstrated moderate nonobstructive disease in the distal LAD without any other significant coronary stenoses.  He underwent TAVR procedure during deployment of the valve developed CHB.  EP is asked onboard to consider PPM implantation.   LABS K+ 3.9 BUN/Creat 17/085 Mag 1.9 WBC 9.3 H/H 11/35 Plts 144   Past Medical History:  Diagnosis Date  . Abnormality of gait 10/20/2014  . Arthritis    "was in my hips; still in my knees" (01/13/2018)  . Bicuspid aortic valve   . Chronic combined systolic and diastolic  heart failure ([place])   . DJD (degenerative joint disease), ankle and foot 10/20/2014  . HAMMER TOE, ACQUIRED 07/28/2007   Qualifier: Diagnosis of  By: Lorelei Pont MD, Frederico Hamman    . HLD (hyperlipidemia) 01/14/2018  . Metatarsalgia of left foot 10/20/2014  . Mild coronary artery disease 01/14/2018   LAD with 50% stenosis on [facility] 12/2017  . Mitral regurgitation   . OA (osteoarthritis) of hip 03/24/2013  . PLANTAR FASCIITIS 07/28/2007   Qualifier: Diagnosis of  By: Lorelei Pont MD, Frederico Hamman    . RBBB   . S/P TAVR (transcatheter aortic valve replacement) 11/10/2018   26 mm Edwards Sapien 3 transcatheter heart valve placed via percutaneous right transfemoral approach   . Severe aortic stenosis   . Varicose veins of bilateral lower extremities with other complications 11/05/7015    Past Surgical History:  Procedure Laterality Date  . ABDOMINAL HYSTERECTOMY  03/03/2012   Procedure: HYSTERECTOMY ABDOMINAL;  Surgeon: Alvino Chapel, MD;  Location: [facility];  Service: Gynecology;  Laterality: N/A;  . BLEPHAROPLASTY Bilateral   . JOINT REPLACEMENT    . 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 RIGHT/LEFT HEART CATH AND CORONARY ANGIOGRAPHY N/A 01/14/2018   Procedure: RIGHT/LEFT HEART CATH AND CORONARY ANGIOGRAPHY;  Surgeon: Wellington Hampshire, MD;  Location: [facility];  Service: Cardiovascular;  Laterality: N/A;  . RIGHT/LEFT HEART CATH AND CORONARY ANGIOGRAPHY N/A 10/23/2018   Procedure: RIGHT/LEFT HEART CATH AND CORONARY ANGIOGRAPHY;  Surgeon: [name], [name], MD;  Location: [facility]  INVASIVE CV LAB;  Service: Cardiovascular;  Laterality: N/A;  . SALPINGOOPHORECTOMY  03/03/2012   Procedure: SALPINGO OOPHERECTOMY;  Surgeon: Alvino Chapel, MD;  Location: [facility];  Service: Gynecology;  Laterality: Bilateral;  . TONSILLECTOMY    . TOTAL HIP ARTHROPLASTY Left  03/24/2013   Procedure: LEFT TOTAL HIP ARTHROPLASTY ANTERIOR APPROACH;  Surgeon: [name], MD;  Location: [facility];  Service: Orthopedics;  Laterality: Left;  . TOTAL HIP ARTHROPLASTY Right 12/18/2016   Procedure: RIGHT TOTAL HIP ARTHROPLASTY ANTERIOR APPROACH;  Surgeon: Gaynelle Arabian, MD;  Location: [facility];  Service: Orthopedics;  Laterality: Right;  . TUBAL LIGATION       Home Medications:  Prior to Admission medications   Medication Sig Start Date End Date Taking? Authorizing Provider  acetaminophen (TYLENOL) 500 MG tablet Take 500-1,000 mg by mouth every 6 (six) hours as needed for moderate pain.   Yes [provider]  aspirin EC 81 MG tablet Take 81 mg by mouth daily.   Yes [provider]  estradiol (CLIMARA - DOSED IN MG/24 HR) 0.025 mg/24hr patch PLACE 1 PATCH (0.025 MG TOTAL) ONTO THE SKIN ONCE A WEEK. 03/11/18  Yes Fontaine, Belinda Block, MD  etodolac (LODINE) 500 MG tablet Take 1 tablet (500 mg total) by mouth daily. 10/05/18  Yes [name], [name], MD  furosemide (LASIX) 20 MG tablet Take 1 tablet (20 mg total) by mouth daily. 10/16/18  Yes [name], [name], MD  Menthol-Methyl Salicylate (MUSCLE RUB EX) Apply 1 application topically daily as needed (muscle pain).   Yes [provider]  potassium chloride SA (K-DUR,KLOR-CON) 20 MEQ tablet Take 1 tablet (20 mEq total) by mouth daily. 10/16/18  Yes [name], [name], MD  sacubitril-valsartan (ENTRESTO) 24-26 MG Take 0.5 tablets by mouth 2 (two) times daily.  10/12/18  Yes [name], [name], MD  UNABLE TO FIND Hyland's Leg Cramps tablets: Take 1 tablet by mouth during the night as needed for leg cramps   Yes [provider]  diclofenac sodium (VOLTAREN) 1 % GEL Apply 1 application topically 4 (four) times daily as needed (pain).    [provider]  famotidine (PEPCID AC) 10 MG tablet Take 10 mg by mouth daily as needed for heartburn or indigestion.    [provider]  zolpidem (AMBIEN) 10 MG  tablet Take 1 tablet (10 mg total) by mouth at bedtime as needed. For sleep 03/11/18   Fontaine, Belinda Block, MD    Inpatient Medications: Scheduled Meds: . [START ON 11/11/2018] aspirin  81 mg Oral Daily  . [START ON 11/11/2018] clopidogrel  75 mg Oral Q breakfast  . sodium chloride flush  3 mL Intravenous Q12H   Continuous Infusions: . sodium chloride 50 mL/hr at 11/10/18 1523  . sodium chloride    . [START ON 11/11/2018] cefUROXime (ZINACEF)  IV    . nitroGLYCERIN    . phenylephrine (NEO-SYNEPHRINE) Adult infusion    . [START ON 11/11/2018] vancomycin     PRN Meds: sodium chloride, acetaminophen **OR** acetaminophen, famotidine, metoprolol tartrate, morphine injection, ondansetron (ZOFRAN) IV, oxyCODONE, sodium chloride flush, traMADol, zolpidem  Allergies:    Allergies  Allergen Reactions  . Codeine Other (See Comments)    Hallucinations    Social History:   Social History   Socioeconomic History  . Marital status: Widowed    Spouse name: Not on file  . Number of children: Not on file  . Years of education: Not on file  . Highest education level: Not  on file  Occupational History  . Not on file  Social Needs  . Financial resource strain: Not on file  . Food insecurity:    Worry: Not on file    Inability: Not on file  . Transportation needs:    Medical: Not on file    Non-medical: Not on file  Tobacco Use  . Smoking status: Never Smoker  . Smokeless tobacco: Never Used  Substance and Sexual Activity  . Alcohol use: Yes    Alcohol/week: 7.0 standard drinks    Types: 7 Standard drinks or equivalent per week    Comment: 1 / PER DAY  . Drug use: No  . Sexual activity: Not Currently    Birth control/protection: Surgical    Comment: 1st intercourse 73 yo-Fewer than 5 partners  Lifestyle  . Physical activity:    Days per week: Not on file    Minutes per session: Not on file  . Stress: Not on file  Relationships  . Social connections:    Talks on phone: Not on  file    Gets together: Not on file    Attends religious service: Not on file    Active member of club or organization: Not on file    Attends meetings of clubs or organizations: Not on file    Relationship status: Not on file  . Intimate partner violence:    Fear of current or ex partner: Not on file    Emotionally abused: Not on file    Physically abused: Not on file    Forced sexual activity: Not on file  Other Topics Concern  . Not on file  Social History Narrative   Lives locally.  Retired. Active - walks frequently and plays tennis regularly.    Family History:   Family History  Problem Relation Age of Onset  . Hypertension Mother   . Bone cancer Father   . Lung cancer Father   . Ovarian cancer Sister   . Diabetes Maternal Aunt   . Breast cancer Maternal Aunt        Age 55's     ROS:  Please see the history of present illness.  All other ROS reviewed and negative.     Physical Exam/Data:   Vitals:   11/10/18 0829 11/10/18 1453 11/10/18 1500  BP: 127/79 (!) 82/58 103/66  Pulse: (!) 59 61 (!) 59  Resp:  14 17  Temp: 97.7 F (36.5 C) (!) 96.9 F (36.1 C)   TempSrc: Oral Axillary   SpO2: 100% 99% 100%  Weight: 65.8 kg    Height: 5' 7.5" (1.715 m)      Intake/Output Summary (Last 24 hours) at 11/10/2018 1539 Last data filed at 11/10/2018 1348 Gross per 24 hour  Intake 1000 ml  Output 50 ml  Net 950 ml   Last 3 Weights 11/10/2018 11/09/2018 11/05/2018  Weight (lbs) 145 lb 147 lb 9.6 oz 147 lb  Weight (kg) 65.772 kg 66.951 kg 66.679 kg     Body mass index is 22.38 kg/m.  General:  Well nourished, well developed, in no acute distress HEENT: normal Lymph: no adenopathy Neck: no JVD Endocrine:  No thryomegaly Vascular: No carotid bruits Cardiac:  RRR; no murmurs, gallops or rubs Lungs:  CTA b/l, no wheezing, rhonchi or rales  Abd: soft, nontender Ext: no edema Musculoskeletal:  No deformities Skin: warm and dry  Neuro:  no focal abnormalities noted Psych:   Normal affect   EKG:  The EKG was personally  reviewed and demonstrates:    SB, 48bpm, 1st degree AVbock, PR 258ms, RBBB  10/15/2018: SR 69, RBBB  Telemetry:  Telemetry was personally reviewed and demonstrates:   SR, rare pacing  Relevant CV Studies:  11/10/2018; TTE (limited) IMPRESSIONS  1. The left ventricle has moderately reduced systolic function, with an ejection fraction of 35-40%. The cavity size was normal. There is moderate concentric left ventricular hypertrophy. Left ventricular diffuse hypokinesis.  2. The right ventricle has normal systolc function. The cavity was normal. There is no increase in right ventricular wall thickness.  3. The mitral valve is degenerative.  4. The aortic valve is tricuspid Severely thickening of the aortic valve Severe calcifcation of the aortic valve. SUMMARY This was a periprocedural TTE during a TAVR procedure. LVEF was moderately decreased estimated at 35-40% with diffuse hypokinesis. Native aortic valve was severely thickened and calcified with severely restricted leaflet opening and transaortic gradients 63/36 mmHg. A 26 mm Edwards-SAPIEN 3 valve was successfully deployed in the aortic position with post-deployment gradients 7/4 mmHg and residual mild paravalvular leak. No pericardial effusion prior or post procedure.  FINDINGS  Left Ventricle: The left ventricle has moderately reduced systolic function, with an ejection fraction of 35-40%. The cavity size was normal. There is moderate concentric left ventricular hypertrophy. Left ventricular diffuse hypokinesis. Right Ventricle: The right ventricle has normal systolic function. The cavity was normal. There is no increase in right ventricular wall thickness. Left Atrium: Left atrial size was normal in size. Right Atrium: Right atrial size was normal in size. Right atrial pressure is estimated at 10 mmHg. Interatrial Septum: No atrial level shunt detected by color flow Doppler.  Pericardium: There is no evidence of pericardial effusion. Mitral Valve: The mitral valve is degenerative in appearance. Mitral valve regurgitation is mild by color flow Doppler. Tricuspid Valve: Tricuspid valve regurgitation was not visualized by color flow Doppler. Aortic Valve: The aortic valve is tricuspid Severely thickening of the aortic valve Severe calcifcation of the aortic valve. Aortic valve regurgitation was not visualized by color flow Doppler. Pulmonic Valve: Pulmonic valve regurgitation is not visualized by color flow Doppler. Venous: The inferior vena cava is normal in size with greater than 50% respiratory variability.   10/07/2018: TTE IMPRESSIONS  1. The left ventricle has severely reduced systolic function of 66-29%. The cavity size is normal. There is no increased left ventricular wall thickness. Echo evidence of restrictive diastolic relaxation Elevated mean left atrial pressure Left  ventricular diffuse hypokinesis.  2. The right ventricle has mildly reduced systolic function. The cavity in mildly enlarged in size. There is no increase in right ventricular wall thickness. Right ventricular systolic pressure is severely elevated.  3. Moderately dilated left atrial size.  4. Mildly dilated right atrial size.  5. The tricuspid valve is normal in structure. Tricuspid valve regurgitation is moderate-severe.  6. The aortic valve has an indeterminant number of cusps. There is moderate calcification of the aortic valve. Severe AS (mean gradient 25 mmHg and AVA 0.69 cm2).  7. The inferior vena cava was dilated in size with >50% respiratory variability.  8. Possible small oscillating density on anterior MV annulus vs aortic valve; consider TEE if clinically indicated.  9. Severe LV dysfunction; restrictive filling; calcified aortic valve with severe AS; biatrial enlargement; mild MR; mild RVE with mild RV dysfunction; moderate to severe TR with severe pulmonary hypertension; cannot R/O  oscillating density as described  above; consider TEE.  FINDINGS  Left Ventricle: The left ventricle has severely reduced  systolic function of 96-75%. The cavity size is normal. There is no increased left ventricular wall thickness. Echo evidence of restrictive diastolic relaxation Elevated mean left atrial pressure  Left ventricular diffuse hypokinesis. There is akinesis of the entire inferolateral left ventricular segment. Right Ventricle: The right ventricle has mildly reduced systolic function. The cavity in mildly enlarged in size. There is no increase in right ventricular wall thickness. Right ventricular systolic pressure is severely elevated. Left Atrium: is moderately dilated Right Atrium: is mildly dilated Interatrial Septum: No atrial level shunt detected by color flow Doppler.  Pericardium: There is no evidence of pericardial effusion. Mitral Valve: The mitral valve is normal in structure There is mild thickening. There is mild mitral annular calcification present. Mitral valve regurgitation is mild to moderate by color flow Doppler. Tricuspid Valve: The tricuspid valve is normal in structure. Tricuspid valve regurgitation is moderate-severe by color flow Doppler. Aortic Valve: The aortic valve has an indeterminant number of cusps. There is moderate calcification of the aortic valve. Aortic valve regurgitation was not visualized by color flow Doppler. The calculated aortic valve area is 0.69 cm, consistent with  severe stenosis. Pulmonic Valve: The pulmonic valve is normal in structure. Pulmonic valve regurgitation is not visualized by color flow Doppler. Venous: The inferior vena cava was dilated in size with greater than 50% respiratory variability.    10/23/2018; LHC  Dist LAD lesion is 30% stenosed.  There is moderate left ventricular systolic dysfunction.  The left ventricular ejection fraction is 35-45% by visual estimate.  LV end diastolic pressure is normal.  There  is moderate aortic valve stenosis.   1. Minimal nonobstructive CAD 2. Moderate LV dysfunction. EF 35-40%. 3. At least moderate AS. Mean gradient 27 mm Hg. AVA may be underestimated due to decreased LV function 4. Normal LV filling pressures. 5. Normal right heart pressures.  Plan: will refer to Valvular heart team for consideration of TAVR.    Laboratory Data:  Chemistry Recent Labs  Lab 11/09/18 0903 11/10/18 1228 11/10/18 1329  NA 138 139 141  K 4.7 4.1 4.0  CL 107  --   --   CO2 19*  --   --   GLUCOSE 106* 98 105*  BUN 21  --   --   CREATININE 1.02*  --   --   CALCIUM 9.6  --   --   GFRNONAA 54*  --   --   GFRAA >60  --   --   ANIONGAP 12  --   --     Recent Labs  Lab 11/09/18 0903  PROT 7.1  ALBUMIN 4.0  AST 24  ALT 15  ALKPHOS 59  BILITOT 0.7   Hematology Recent Labs  Lab 11/09/18 0903 11/10/18 1228 11/10/18 1329  WBC 4.5  --   --   RBC 4.30  --   --   HGB 14.0 12.6 11.2*  HCT 43.2 37.0 33.0*  MCV 100.5*  --   --   MCH 32.6  --   --   MCHC 32.4  --   --   RDW 13.2  --   --   PLT 138*  --   --    Cardiac EnzymesNo results for input(s): TROPONINI in the last 168 hours. No results for input(s): TROPIPOC in the last 168 hours.  BNP Recent Labs  Lab 11/09/18 0903  BNP 570.3*    DDimer No results for input(s): DDIMER in the last 168 hours.  Radiology/Studies:  Dg Chest Port 1 View Result Date: 11/10/2018 CLINICAL DATA:  Status post transcatheter aortic valve replacement today. EXAM: PORTABLE CHEST 1 VIEW COMPARISON:  PA and lateral chest 11/09/2018. FINDINGS: New aortic valve replacement is in place. Pacing lead from an inferior vena cava approach is identified with its tip in the right atrium. Lungs are clear. No pneumothorax or pleural effusion. Heart size is upper normal. Aortic atherosclerosis noted. No acute or focal bony abnormality. IMPRESSION: No acute disease. Lead of IVC approach pacing device projects in the right atrium.  Electronically Signed   By: Inge Rise M.D.   On: 11/10/2018 15:13    Assessment and Plan:   1. VHD w/severe AS     S/p TAVR 2. Intra-procedure CHB     Baseline RBBB 3. NICM     LVEF by TTE 10/07/2018 was 25-30%, limited TEE during her procedure 11/10/2018 with LVEF 35-40%  Dr. Lovena Le has seen and examined the patient.  Discussed PPM implant rational/indication, discussed her LVEF and plans for CRT pacer implant, the procedure, it's potential risks and benefits.  The patient is agreeable to proceed.      For questions or updates, please contact [place] Please consult [email or web address] for contact info under     Signed, Baldwin [place], PA-C  11/10/2018 3:39 PM  EP Attending  Patient seen and examined. Agree with above. The patient has a h/o RBBB and developed CHB after TAVR. She has LV dysfunction and is expected to be paced a significant percentage of time. With her LV dysfunction, we will place a biv PPM. I have reviewed the indications/risks/benefits/goals/expectations of PPM insertion and she wishes to proceed.  Mikle Bosworth.D.

## 2018-11-10 NOTE — Consult Note (Addendum)
Cardiology Consultation:   Patient ID: FARHIYA ROSTEN MRN: [id number]; DOB: Feb 19, 1946  Admit date: 11/10/2018 Date of Consult: 11/10/2018  Primary Care Provider: [name], MD Primary Cardiologist: Dr. [name] Structural Heart: Dr. [name] Primary Electrophysiologist:  None    Patient Profile:   Brittany Shaffer is a 73 y.o. female with a hx of HLD, OA, non-obstructive CAD, NICM, chronic CHF (systolic), RBBB, and VHD w/severe AS who is being seen today for the evaluation of intra-procedure (TAVR) CHB, evaluatein for PPM at the request of Dr. [name].  History of Present Illness:   Ms. Paez in review of his record, in May 2019 she presented with chest pain and shortness of breath. An echo showed moderate aortic stenosis and she underwent right and left heart catheterization demonstrating no obstructive CAD, elevated filling pressures, moderate MR, and only mild aortic stenosis. She has continued to experience exertional dyspnea and she underwent formal pulmonary evaluation. She had no evidence of COPD or interstitial lung disease. She was seen in follow-up by Dr [name] about one month ago and underwent a CTA of the chest to evaluate for PE. This demonstrated no PE but showed bilateral pleural effusions and interstitial edema consistent with CHF. An updated echo demonstrated progressive LV systolic dysfunction, now with LVEF approximately 30%.  A repeat heart catheterization demonstrated moderate nonobstructive disease in the distal LAD without any other significant coronary stenoses.  He underwent TAVR procedure during deployment of the valve developed CHB.  EP is asked onboard to consider PPM implantation.   LABS K+ 3.9 BUN/Creat 17/085 Mag 1.9 WBC 9.3 H/H 11/35 Plts 144   Past Medical History:  Diagnosis Date  . Abnormality of gait 10/20/2014  . Arthritis    "was in my hips; still in my knees" (01/13/2018)  . Bicuspid aortic valve   . Chronic combined systolic and diastolic  heart failure ([place])   . DJD (degenerative joint disease), ankle and foot 10/20/2014  . HAMMER TOE, ACQUIRED 07/28/2007   Qualifier: Diagnosis of  By: Lorelei Pont MD, Frederico Hamman    . HLD (hyperlipidemia) 01/14/2018  . Metatarsalgia of left foot 10/20/2014  . Mild coronary artery disease 01/14/2018   LAD with 50% stenosis on [facility] 12/2017  . Mitral regurgitation   . OA (osteoarthritis) of hip 03/24/2013  . PLANTAR FASCIITIS 07/28/2007   Qualifier: Diagnosis of  By: Lorelei Pont MD, Frederico Hamman    . RBBB   . S/P TAVR (transcatheter aortic valve replacement) 11/10/2018   26 mm Edwards Sapien 3 transcatheter heart valve placed via percutaneous right transfemoral approach   . Severe aortic stenosis   . Varicose veins of bilateral lower extremities with other complications 11/04/3612    Past Surgical History:  Procedure Laterality Date  . ABDOMINAL HYSTERECTOMY  03/03/2012   Procedure: HYSTERECTOMY ABDOMINAL;  Surgeon: Alvino Chapel, MD;  Location: [facility];  Service: Gynecology;  Laterality: N/A;  . BLEPHAROPLASTY Bilateral   . JOINT REPLACEMENT    . 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 RIGHT/LEFT HEART CATH AND CORONARY ANGIOGRAPHY N/A 01/14/2018   Procedure: RIGHT/LEFT HEART CATH AND CORONARY ANGIOGRAPHY;  Surgeon: Wellington Hampshire, MD;  Location: [facility] CV [facility];  Service: Cardiovascular;  Laterality: N/A;  . RIGHT/LEFT HEART CATH AND CORONARY ANGIOGRAPHY N/A 10/23/2018   Procedure: RIGHT/LEFT HEART CATH AND CORONARY ANGIOGRAPHY;  Surgeon: [name], [name], MD;  Location: [facility]  INVASIVE CV LAB;  Service: Cardiovascular;  Laterality: N/A;  . SALPINGOOPHORECTOMY  03/03/2012   Procedure: SALPINGO OOPHERECTOMY;  Surgeon: Alvino Chapel, MD;  Location: [facility];  Service: Gynecology;  Laterality: Bilateral;  . TONSILLECTOMY    . TOTAL HIP ARTHROPLASTY Left  03/24/2013   Procedure: LEFT TOTAL HIP ARTHROPLASTY ANTERIOR APPROACH;  Surgeon: [name], MD;  Location: [facility];  Service: Orthopedics;  Laterality: Left;  . TOTAL HIP ARTHROPLASTY Right 12/18/2016   Procedure: RIGHT TOTAL HIP ARTHROPLASTY ANTERIOR APPROACH;  Surgeon: Gaynelle Arabian, MD;  Location: [facility];  Service: Orthopedics;  Laterality: Right;  . TUBAL LIGATION       Home Medications:  Prior to Admission medications   Medication Sig Start Date End Date Taking? Authorizing Provider  acetaminophen (TYLENOL) 500 MG tablet Take 500-1,000 mg by mouth every 6 (six) hours as needed for moderate pain.   Yes [provider]  aspirin EC 81 MG tablet Take 81 mg by mouth daily.   Yes [provider]  estradiol (CLIMARA - DOSED IN MG/24 HR) 0.025 mg/24hr patch PLACE 1 PATCH (0.025 MG TOTAL) ONTO THE SKIN ONCE A WEEK. 03/11/18  Yes Fontaine, Belinda Block, MD  etodolac (LODINE) 500 MG tablet Take 1 tablet (500 mg total) by mouth daily. 10/05/18  Yes [name], [name], MD  furosemide (LASIX) 20 MG tablet Take 1 tablet (20 mg total) by mouth daily. 10/16/18  Yes [name], [name], MD  Menthol-Methyl Salicylate (MUSCLE RUB EX) Apply 1 application topically daily as needed (muscle pain).   Yes [provider]  potassium chloride SA (K-DUR,KLOR-CON) 20 MEQ tablet Take 1 tablet (20 mEq total) by mouth daily. 10/16/18  Yes [name], [name], MD  sacubitril-valsartan (ENTRESTO) 24-26 MG Take 0.5 tablets by mouth 2 (two) times daily.  10/12/18  Yes [name], [name], MD  UNABLE TO FIND Hyland's Leg Cramps tablets: Take 1 tablet by mouth during the night as needed for leg cramps   Yes [provider]  diclofenac sodium (VOLTAREN) 1 % GEL Apply 1 application topically 4 (four) times daily as needed (pain).    [provider]  famotidine (PEPCID AC) 10 MG tablet Take 10 mg by mouth daily as needed for heartburn or indigestion.    [provider]  zolpidem (AMBIEN) 10 MG  tablet Take 1 tablet (10 mg total) by mouth at bedtime as needed. For sleep 03/11/18   Fontaine, Belinda Block, MD    Inpatient Medications: Scheduled Meds: . [START ON 11/11/2018] aspirin  81 mg Oral Daily  . [START ON 11/11/2018] clopidogrel  75 mg Oral Q breakfast  . sodium chloride flush  3 mL Intravenous Q12H   Continuous Infusions: . sodium chloride 50 mL/hr at 11/10/18 1523  . sodium chloride    . [START ON 11/11/2018] cefUROXime (ZINACEF)  IV    . nitroGLYCERIN    . phenylephrine (NEO-SYNEPHRINE) Adult infusion    . [START ON 11/11/2018] vancomycin     PRN Meds: sodium chloride, acetaminophen **OR** acetaminophen, famotidine, metoprolol tartrate, morphine injection, ondansetron (ZOFRAN) IV, oxyCODONE, sodium chloride flush, traMADol, zolpidem  Allergies:    Allergies  Allergen Reactions  . Codeine Other (See Comments)    Hallucinations    Social History:   Social History   Socioeconomic History  . Marital status: Widowed    Spouse name: Not on file  . Number of children: Not on file  . Years of education: Not on file  . Highest education level: Not  on file  Occupational History  . Not on file  Social Needs  . Financial resource strain: Not on file  . Food insecurity:    Worry: Not on file    Inability: Not on file  . Transportation needs:    Medical: Not on file    Non-medical: Not on file  Tobacco Use  . Smoking status: Never Smoker  . Smokeless tobacco: Never Used  Substance and Sexual Activity  . Alcohol use: Yes    Alcohol/week: 7.0 standard drinks    Types: 7 Standard drinks or equivalent per week    Comment: 1 / PER DAY  . Drug use: No  . Sexual activity: Not Currently    Birth control/protection: Surgical    Comment: 1st intercourse 73 yo-Fewer than 5 partners  Lifestyle  . Physical activity:    Days per week: Not on file    Minutes per session: Not on file  . Stress: Not on file  Relationships  . Social connections:    Talks on phone: Not on  file    Gets together: Not on file    Attends religious service: Not on file    Active member of club or organization: Not on file    Attends meetings of clubs or organizations: Not on file    Relationship status: Not on file  . Intimate partner violence:    Fear of current or ex partner: Not on file    Emotionally abused: Not on file    Physically abused: Not on file    Forced sexual activity: Not on file  Other Topics Concern  . Not on file  Social History Narrative   Lives locally.  Retired. Active - walks frequently and plays tennis regularly.    Family History:   Family History  Problem Relation Age of Onset  . Hypertension Mother   . Bone cancer Father   . Lung cancer Father   . Ovarian cancer Sister   . Diabetes Maternal Aunt   . Breast cancer Maternal Aunt        Age 22's     ROS:  Please see the history of present illness.  All other ROS reviewed and negative.     Physical Exam/Data:   Vitals:   11/10/18 0829 11/10/18 1453 11/10/18 1500  BP: 127/79 (!) 82/58 103/66  Pulse: (!) 59 61 (!) 59  Resp:  14 17  Temp: 97.7 F (36.5 C) (!) 96.9 F (36.1 C)   TempSrc: Oral Axillary   SpO2: 100% 99% 100%  Weight: 65.8 kg    Height: 5' 7.5" (1.715 m)      Intake/Output Summary (Last 24 hours) at 11/10/2018 1539 Last data filed at 11/10/2018 1348 Gross per 24 hour  Intake 1000 ml  Output 50 ml  Net 950 ml   Last 3 Weights 11/10/2018 11/09/2018 11/05/2018  Weight (lbs) 145 lb 147 lb 9.6 oz 147 lb  Weight (kg) 65.772 kg 66.951 kg 66.679 kg     Body mass index is 22.38 kg/m.  General:  Well nourished, well developed, in no acute distress HEENT: normal Lymph: no adenopathy Neck: no JVD Endocrine:  No thryomegaly Vascular: No carotid bruits Cardiac:  RRR; no murmurs, gallops or rubs Lungs:  CTA b/l, no wheezing, rhonchi or rales  Abd: soft, nontender Ext: no edema Musculoskeletal:  No deformities Skin: warm and dry  Neuro:  no focal abnormalities noted Psych:   Normal affect   EKG:  The EKG was personally  reviewed and demonstrates:    SB, 48bpm, 1st degree AVbock, PR 25ms, RBBB  10/15/2018: SR 69, RBBB  Telemetry:  Telemetry was personally reviewed and demonstrates:   SR, rare pacing  Relevant CV Studies:  11/10/2018; TTE (limited) IMPRESSIONS  1. The left ventricle has moderately reduced systolic function, with an ejection fraction of 35-40%. The cavity size was normal. There is moderate concentric left ventricular hypertrophy. Left ventricular diffuse hypokinesis.  2. The right ventricle has normal systolc function. The cavity was normal. There is no increase in right ventricular wall thickness.  3. The mitral valve is degenerative.  4. The aortic valve is tricuspid Severely thickening of the aortic valve Severe calcifcation of the aortic valve. SUMMARY This was a periprocedural TTE during a TAVR procedure. LVEF was moderately decreased estimated at 35-40% with diffuse hypokinesis. Native aortic valve was severely thickened and calcified with severely restricted leaflet opening and transaortic gradients 63/36 mmHg. A 26 mm Edwards-SAPIEN 3 valve was successfully deployed in the aortic position with post-deployment gradients 7/4 mmHg and residual mild paravalvular leak. No pericardial effusion prior or post procedure.  FINDINGS  Left Ventricle: The left ventricle has moderately reduced systolic function, with an ejection fraction of 35-40%. The cavity size was normal. There is moderate concentric left ventricular hypertrophy. Left ventricular diffuse hypokinesis. Right Ventricle: The right ventricle has normal systolic function. The cavity was normal. There is no increase in right ventricular wall thickness. Left Atrium: Left atrial size was normal in size. Right Atrium: Right atrial size was normal in size. Right atrial pressure is estimated at 10 mmHg. Interatrial Septum: No atrial level shunt detected by color flow  Doppler. Pericardium: There is no evidence of pericardial effusion. Mitral Valve: The mitral valve is degenerative in appearance. Mitral valve regurgitation is mild by color flow Doppler. Tricuspid Valve: Tricuspid valve regurgitation was not visualized by color flow Doppler. Aortic Valve: The aortic valve is tricuspid Severely thickening of the aortic valve Severe calcifcation of the aortic valve. Aortic valve regurgitation was not visualized by color flow Doppler. Pulmonic Valve: Pulmonic valve regurgitation is not visualized by color flow Doppler. Venous: The inferior vena cava is normal in size with greater than 50% respiratory variability.   10/07/2018: TTE IMPRESSIONS  1. The left ventricle has severely reduced systolic function of 68-34%. The cavity size is normal. There is no increased left ventricular wall thickness. Echo evidence of restrictive diastolic relaxation Elevated mean left atrial pressure Left  ventricular diffuse hypokinesis.  2. The right ventricle has mildly reduced systolic function. The cavity in mildly enlarged in size. There is no increase in right ventricular wall thickness. Right ventricular systolic pressure is severely elevated.  3. Moderately dilated left atrial size.  4. Mildly dilated right atrial size.  5. The tricuspid valve is normal in structure. Tricuspid valve regurgitation is moderate-severe.  6. The aortic valve has an indeterminant number of cusps. There is moderate calcification of the aortic valve. Severe AS (mean gradient 25 mmHg and AVA 0.69 cm2).  7. The inferior vena cava was dilated in size with >50% respiratory variability.  8. Possible small oscillating density on anterior MV annulus vs aortic valve; consider TEE if clinically indicated.  9. Severe LV dysfunction; restrictive filling; calcified aortic valve with severe AS; biatrial enlargement; mild MR; mild RVE with mild RV dysfunction; moderate to severe TR with severe pulmonary hypertension;  cannot R/O oscillating density as described  above; consider TEE.  FINDINGS  Left Ventricle: The left ventricle has severely reduced  systolic function of 68-12%. The cavity size is normal. There is no increased left ventricular wall thickness. Echo evidence of restrictive diastolic relaxation Elevated mean left atrial pressure  Left ventricular diffuse hypokinesis. There is akinesis of the entire inferolateral left ventricular segment. Right Ventricle: The right ventricle has mildly reduced systolic function. The cavity in mildly enlarged in size. There is no increase in right ventricular wall thickness. Right ventricular systolic pressure is severely elevated. Left Atrium: is moderately dilated Right Atrium: is mildly dilated Interatrial Septum: No atrial level shunt detected by color flow Doppler.  Pericardium: There is no evidence of pericardial effusion. Mitral Valve: The mitral valve is normal in structure There is mild thickening. There is mild mitral annular calcification present. Mitral valve regurgitation is mild to moderate by color flow Doppler. Tricuspid Valve: The tricuspid valve is normal in structure. Tricuspid valve regurgitation is moderate-severe by color flow Doppler. Aortic Valve: The aortic valve has an indeterminant number of cusps. There is moderate calcification of the aortic valve. Aortic valve regurgitation was not visualized by color flow Doppler. The calculated aortic valve area is 0.69 cm, consistent with  severe stenosis. Pulmonic Valve: The pulmonic valve is normal in structure. Pulmonic valve regurgitation is not visualized by color flow Doppler. Venous: The inferior vena cava was dilated in size with greater than 50% respiratory variability.    10/23/2018; LHC  Dist LAD lesion is 30% stenosed.  There is moderate left ventricular systolic dysfunction.  The left ventricular ejection fraction is 35-45% by visual estimate.  LV end diastolic pressure is  normal.  There is moderate aortic valve stenosis.   1. Minimal nonobstructive CAD 2. Moderate LV dysfunction. EF 35-40%. 3. At least moderate AS. Mean gradient 27 mm Hg. AVA may be underestimated due to decreased LV function 4. Normal LV filling pressures. 5. Normal right heart pressures.  Plan: will refer to Valvular heart team for consideration of TAVR.    Laboratory Data:  Chemistry Recent Labs  Lab 11/09/18 0903 11/10/18 1228 11/10/18 1329  NA 138 139 141  K 4.7 4.1 4.0  CL 107  --   --   CO2 19*  --   --   GLUCOSE 106* 98 105*  BUN 21  --   --   CREATININE 1.02*  --   --   CALCIUM 9.6  --   --   GFRNONAA 54*  --   --   GFRAA >60  --   --   ANIONGAP 12  --   --     Recent Labs  Lab 11/09/18 0903  PROT 7.1  ALBUMIN 4.0  AST 24  ALT 15  ALKPHOS 59  BILITOT 0.7   Hematology Recent Labs  Lab 11/09/18 0903 11/10/18 1228 11/10/18 1329  WBC 4.5  --   --   RBC 4.30  --   --   HGB 14.0 12.6 11.2*  HCT 43.2 37.0 33.0*  MCV 100.5*  --   --   MCH 32.6  --   --   MCHC 32.4  --   --   RDW 13.2  --   --   PLT 138*  --   --    Cardiac EnzymesNo results for input(s): TROPONINI in the last 168 hours. No results for input(s): TROPIPOC in the last 168 hours.  BNP Recent Labs  Lab 11/09/18 0903  BNP 570.3*    DDimer No results for input(s): DDIMER in the last 168 hours.  Radiology/Studies:  Dg Chest Port 1 View Result Date: 11/10/2018 CLINICAL DATA:  Status post transcatheter aortic valve replacement today. EXAM: PORTABLE CHEST 1 VIEW COMPARISON:  PA and lateral chest 11/09/2018. FINDINGS: New aortic valve replacement is in place. Pacing lead from an inferior vena cava approach is identified with its tip in the right atrium. Lungs are clear. No pneumothorax or pleural effusion. Heart size is upper normal. Aortic atherosclerosis noted. No acute or focal bony abnormality. IMPRESSION: No acute disease. Lead of IVC approach pacing device projects in the right  atrium. Electronically Signed   By: Inge Rise M.D.   On: 11/10/2018 15:13    Assessment and Plan:   1. VHD w/severe AS     S/p TAVR 2. Intra-procedure CHB     Baseline RBBB 3. NICM     LVEF by TTE 10/07/2018 was 25-30%, limited TEE during her procedure 11/10/2018 with LVEF 35-40%  Dr. Lovena Le has seen and examined the patient.  Discussed PPM implant rational/indication, discussed her LVEF and plans for CRT pacer implant, the procedure, it's potential risks and benefits.  The patient is agreeable to proceed.      For questions or updates, please contact [place] Please consult [email or web address] for contact info under     Signed, Baldwin Jamaica, PA-C  11/10/2018 3:39 PM  EP Attending  Patient seen and examined. Agree with above. The patient has a h/o RBBB and developed CHB after TAVR. She has LV dysfunction and is expected to be paced a significant percentage of time. With her LV dysfunction, we will place a biv PPM. I have reviewed the indications/risks/benefits/goals/expectations of PPM insertion and she wishes to proceed.  Mikle [place].D.

## 2018-11-10 NOTE — Anesthesia Procedure Notes (Signed)
Procedure Name: MAC Date/Time: 11/10/2018 12:15 PM Performed by: Murvin Natal, MD Pre-anesthesia Checklist: Patient identified, Emergency Drugs available, Suction available and Patient being monitored Oxygen Delivery Method: Simple face mask

## 2018-11-10 NOTE — Anesthesia Procedure Notes (Signed)
Arterial Line Insertion Start/End3/06/2019 10:30 AM, 11/10/2018 10:40 AM Performed by: Verdie Drown, CRNA, CRNA  Patient location: Pre-op. Preanesthetic checklist: patient identified, IV checked, site marked, risks and benefits discussed, surgical consent, monitors and equipment checked, pre-op evaluation, timeout performed and anesthesia consent Right, radial was placed Catheter size: 20 G Hand hygiene performed  and maximum sterile barriers used  Allen's test indicative of satisfactory collateral circulation Attempts: 1 Procedure performed without using ultrasound guided technique. Following insertion, dressing applied and Biopatch. Post procedure assessment: normal  Patient tolerated the procedure well with no immediate complications.

## 2018-11-10 NOTE — Anesthesia Preprocedure Evaluation (Addendum)
Anesthesia Evaluation  Patient identified by MRN, date of birth, ID band Patient awake    Reviewed: Allergy & Precautions, NPO status , Patient's Chart, lab work & pertinent test results  Airway Mallampati: II  TM Distance: >3 FB Neck ROM: Full    Dental no notable dental hx.    Pulmonary neg pulmonary ROS,    Pulmonary exam normal breath sounds clear to auscultation       Cardiovascular + angina + CAD and +CHF  + Valvular Problems/Murmurs AS and MR  Rhythm:Regular Rate:Normal + Systolic murmurs ECG: bifascicular block, rate 69  CATH Dist LAD lesion is 30% stenosed. There is moderate left ventricular systolic dysfunction. The left ventricular ejection fraction is 35-45% by visual estimate. LV end diastolic pressure is normal. There is moderate aortic valve stenosis.   1. Minimal nonobstructive CAD 2. Moderate LV dysfunction. EF 35-40%. 3. At least moderate AS. Mean gradient 27 mm Hg. AVA may be underestimated due to decreased LV function 4. Normal LV filling pressures. 5. Normal right heart pressures.  ECHO: 1. The left ventricle has severely reduced systolic function of 82-50%. The cavity size is normal. There is no increased left ventricular wall thickness. Echo evidence of restrictive diastolic relaxation Elevated mean left atrial pressure Left  ventricular diffuse hypokinesis.  2. The right ventricle has mildly reduced systolic function. The cavity in mildly enlarged in size. There is no increase in right ventricular wall thickness. Right ventricular systolic pressure is severely elevated.  3. Moderately dilated left atrial size.  4. Mildly dilated right atrial size.  5. The tricuspid valve is normal in structure. Tricuspid valve regurgitation is moderate-severe.  6. The aortic valve has an indeterminant number of cusps. There is moderate calcification of the aortic valve. Severe AS (mean gradient 25 mmHg and AVA 0.69  cm2).  7. The inferior vena cava was dilated in size with >50% respiratory variability.  8. Possible small oscillating density on anterior MV annulus vs aortic valve; consider TEE if clinically indicated.  9. Severe LV dysfunction; restrictive filling; calcified aortic valve with severe AS; biatrial enlargement; mild MR; mild RVE with mild RV dysfunction; moderate to severe TR with severe pulmonary hypertension; cannot R/O oscillating density as described  above; consider TEE.   Neuro/Psych negative neurological ROS  negative psych ROS   GI/Hepatic negative GI ROS, Neg liver ROS,   Endo/Other  negative endocrine ROS  Renal/GU negative Renal ROS     Musculoskeletal  (+) Arthritis ,   Abdominal   Peds  Hematology negative hematology ROS (+)   Anesthesia Other Findings Severe Aortic Stenosis  Reproductive/Obstetrics                           Anesthesia Physical Anesthesia Plan  ASA: IV  Anesthesia Plan: MAC   Post-op Pain Management:    Induction: Intravenous  PONV Risk Score and Plan: 2 and Ondansetron, Dexamethasone, Treatment may vary due to age or medical condition and Midazolam  Airway Management Planned: Simple Face Mask  Additional Equipment: Arterial line  Intra-op Plan:   Post-operative Plan:   Informed Consent: I have reviewed the patients History and Physical, chart, labs and discussed the procedure including the risks, benefits and alternatives for the proposed anesthesia with the patient or authorized representative who has indicated his/her understanding and acceptance.     Dental advisory given  Plan Discussed with: CRNA  Anesthesia Plan Comments:        Anesthesia Quick  Evaluation

## 2018-11-10 NOTE — Transfer of Care (Signed)
Immediate Anesthesia Transfer of Care Note  Patient: Brittany Shaffer  Procedure(s) Performed: TRANSCATHETER AORTIC VALVE REPLACEMENT, TRANSFEMORAL (N/A Chest) TRANSESOPHAGEAL ECHOCARDIOGRAM (TEE) (N/A ) TRANSCATHETER AORTIC VALVE REPLACEMENT, TRANSFEMORAL (N/A ) TRANSESOPHAGEAL ECHOCARDIOGRAM (TEE) (N/A )  Patient Location: ICU  Anesthesia Type:MAC  Level of Consciousness: awake, alert , oriented and patient cooperative  Airway & Oxygen Therapy: Patient Spontanous Breathing and Patient connected to face mask oxygen  Post-op Assessment: Report given to RN, Post -op Vital signs reviewed and stable and Patient moving all extremities  Post vital signs: Reviewed and stable  Last Vitals:  Vitals Value Taken Time  BP    Temp    Pulse    Resp    SpO2      Last Pain:  Vitals:   11/10/18 0951  TempSrc:   PainSc: 0-No pain      Patients Stated Pain Goal: 3 (44/62/86 [id number])  Complications: No apparent anesthesia complications

## 2018-11-10 NOTE — Interval H&P Note (Signed)
History and Physical Interval Note:  11/10/2018 12:28 PM  [name]  has presented today for surgery, with the diagnosis of Aortic Stenosis.  The various methods of treatment have been discussed with the patient and family. After consideration of risks, benefits and other options for treatment, the patient has consented to  Procedure(s): TRANSCATHETER AORTIC VALVE REPLACEMENT, TRANSFEMORAL (N/A) TRANSESOPHAGEAL ECHOCARDIOGRAM (TEE) (N/A) as a surgical intervention.  The patient's history has been reviewed, patient examined, no change in status, stable for surgery.  I have reviewed the patient's chart and labs.  Questions were answered to the patient's satisfaction.     Brittany Shaffer

## 2018-11-10 NOTE — Progress Notes (Signed)
Patient ID: Brittany Shaffer, female   DOB: 06-30-1946, 73 y.o.   MRN: [id number] TCTS Evening Rounds:  Hemodynamically stable in sinus rhythm 50's with temp pacer on VVI 35. She has some episodes of pacing.  Awake and alert Groin sites look good.

## 2018-11-11 ENCOUNTER — Encounter (HOSPITAL_COMMUNITY): Payer: Self-pay | Admitting: Cardiovascular Disease

## 2018-11-11 ENCOUNTER — Inpatient Hospital Stay (HOSPITAL_COMMUNITY): Payer: PPO

## 2018-11-11 ENCOUNTER — Other Ambulatory Visit: Payer: Self-pay | Admitting: Physician Assistant

## 2018-11-11 ENCOUNTER — Encounter (HOSPITAL_COMMUNITY)
Admission: RE | Disposition: A | Discharge status: Home / Self Care | Payer: Self-pay | Source: Home / Self Care | Attending: Thoracic Surgery (Cardiothoracic Vascular Surgery)

## 2018-11-11 DIAGNOSIS — I5022 Chronic systolic (congestive) heart failure: Secondary | ICD-10-CM

## 2018-11-11 DIAGNOSIS — Z952 Presence of prosthetic heart valve: Secondary | ICD-10-CM

## 2018-11-11 DIAGNOSIS — I350 Nonrheumatic aortic (valve) stenosis: Secondary | ICD-10-CM

## 2018-11-11 DIAGNOSIS — Z006 Encounter for examination for normal comparison and control in clinical research program: Secondary | ICD-10-CM

## 2018-11-11 HISTORY — PX: BIV PACEMAKER INSERTION CRT-P: EP1199

## 2018-11-11 LAB — CBC
HCT: 35.4 % — ABNORMAL LOW (ref 36.0–46.0)
Hemoglobin: 11.6 g/dL — ABNORMAL LOW (ref 12.0–15.0)
MCH: 32.3 pg (ref 26.0–34.0)
MCHC: 32.8 g/dL (ref 30.0–36.0)
MCV: 98.6 fL (ref 80.0–100.0)
PLATELETS: 144 10*3/uL — AB (ref 150–400)
RBC: 3.59 MIL/uL — AB (ref 3.87–5.11)
RDW: 12.8 % (ref 11.5–15.5)
WBC: 9.3 10*3/uL (ref 4.0–10.5)
nRBC: 0 % (ref 0.0–0.2)

## 2018-11-11 LAB — MAGNESIUM: MAGNESIUM: 1.9 mg/dL (ref 1.7–2.4)

## 2018-11-11 LAB — ECHOCARDIOGRAM LIMITED
HEIGHTINCHES: 67.5 in
Weight: 2419.77 oz

## 2018-11-11 LAB — BASIC METABOLIC PANEL
Anion gap: 7 (ref 5–15)
BUN: 17 mg/dL (ref 8–23)
CO2: 23 mmol/L (ref 22–32)
Calcium: 8.7 mg/dL — ABNORMAL LOW (ref 8.9–10.3)
Chloride: 109 mmol/L (ref 98–111)
Creatinine, Ser: 0.85 mg/dL (ref 0.44–1.00)
GFR calc Af Amer: >60 mL/min (ref >60)
Glucose, Bld: 127 mg/dL — ABNORMAL HIGH (ref 70–99)
Potassium: 3.9 mmol/L (ref 3.5–5.1)
SODIUM: 139 mmol/L (ref 135–145)

## 2018-11-11 SURGERY — BIV PACEMAKER INSERTION CRT-P

## 2018-11-11 MED ORDER — LIDOCAINE HCL 1 % IJ SOLN
INTRAMUSCULAR | Status: AC
  Filled 2018-11-11: qty 60

## 2018-11-11 MED ORDER — FENTANYL CITRATE (PF) 100 MCG/2ML IJ SOLN
INTRAMUSCULAR | Status: DC | PRN
  Administered 2018-11-11: 12.5 ug via INTRAVENOUS
  Administered 2018-11-11: 25 ug via INTRAVENOUS
  Administered 2018-11-11 (×3): 12.5 ug via INTRAVENOUS

## 2018-11-11 MED ORDER — ATROPINE SULFATE 1 MG/10ML IJ SOSY
PREFILLED_SYRINGE | INTRAMUSCULAR | Status: AC
  Filled 2018-11-11: qty 10

## 2018-11-11 MED ORDER — LIDOCAINE HCL (PF) 1 % IJ SOLN
INTRAMUSCULAR | Status: DC | PRN
  Administered 2018-11-11: 60 mL

## 2018-11-11 MED ORDER — ACETAMINOPHEN 325 MG PO TABS
325.0000 mg | ORAL_TABLET | ORAL | Status: DC | PRN
  Administered 2018-11-12: 650 mg via ORAL
  Filled 2018-11-11: qty 2

## 2018-11-11 MED ORDER — HEPARIN (PORCINE) IN NACL 1000-0.9 UT/500ML-% IV SOLN
INTRAVENOUS | Status: AC
  Filled 2018-11-11: qty 500

## 2018-11-11 MED ORDER — CHLORHEXIDINE GLUCONATE 4 % EX LIQD
60.0000 mL | Freq: Once | CUTANEOUS | Status: AC
  Administered 2018-11-11: 4 via TOPICAL

## 2018-11-11 MED ORDER — MIDAZOLAM HCL 5 MG/5ML IJ SOLN
INTRAMUSCULAR | Status: DC | PRN
  Administered 2018-11-11 (×6): 1 mg via INTRAVENOUS

## 2018-11-11 MED ORDER — IOHEXOL 350 MG/ML SOLN
INTRAVENOUS | Status: DC | PRN
  Administered 2018-11-11: 50 mL via INTRAVENOUS

## 2018-11-11 MED ORDER — SODIUM CHLORIDE 0.9 % IV SOLN
80.0000 mg | INTRAVENOUS | Status: AC
  Administered 2018-11-11: 80 mg
  Filled 2018-11-11: qty 2

## 2018-11-11 MED ORDER — FENTANYL CITRATE (PF) 100 MCG/2ML IJ SOLN
INTRAMUSCULAR | Status: AC
  Filled 2018-11-11: qty 2

## 2018-11-11 MED ORDER — SODIUM CHLORIDE 0.9 % IV SOLN
INTRAVENOUS | Status: DC
  Administered 2018-11-11: 16:00:00 via INTRAVENOUS

## 2018-11-11 MED ORDER — CEFAZOLIN SODIUM-DEXTROSE 2-4 GM/100ML-% IV SOLN
2.0000 g | INTRAVENOUS | Status: AC
  Administered 2018-11-11: 2 g via INTRAVENOUS
  Filled 2018-11-11: qty 100

## 2018-11-11 MED ORDER — CEFAZOLIN SODIUM-DEXTROSE 2-4 GM/100ML-% IV SOLN
INTRAVENOUS | Status: AC
  Filled 2018-11-11: qty 100

## 2018-11-11 MED ORDER — HEPARIN (PORCINE) IN NACL 1000-0.9 UT/500ML-% IV SOLN
INTRAVENOUS | Status: DC | PRN
  Administered 2018-11-11: 500 mL

## 2018-11-11 MED ORDER — MIDAZOLAM HCL 5 MG/5ML IJ SOLN
INTRAMUSCULAR | Status: AC
  Filled 2018-11-11: qty 5

## 2018-11-11 MED ORDER — ASPIRIN EC 81 MG PO TBEC
81.0000 mg | DELAYED_RELEASE_TABLET | Freq: Every day | ORAL | Status: DC
  Administered 2018-11-12: 81 mg via ORAL
  Filled 2018-11-11: qty 1

## 2018-11-11 MED ORDER — ONDANSETRON HCL 4 MG/2ML IJ SOLN
4.0000 mg | Freq: Four times a day (QID) | INTRAMUSCULAR | Status: DC | PRN
  Administered 2018-11-11 – 2018-11-12 (×2): 4 mg via INTRAVENOUS
  Filled 2018-11-11: qty 2

## 2018-11-11 MED ORDER — SODIUM CHLORIDE 0.9 % IV SOLN
INTRAVENOUS | Status: AC
  Filled 2018-11-11: qty 2

## 2018-11-11 MED ORDER — CEFAZOLIN SODIUM-DEXTROSE 1-4 GM/50ML-% IV SOLN
1.0000 g | Freq: Four times a day (QID) | INTRAVENOUS | Status: AC
  Administered 2018-11-11 – 2018-11-12 (×3): 1 g via INTRAVENOUS
  Filled 2018-11-11 (×3): qty 50

## 2018-11-11 MED ORDER — CHLORHEXIDINE GLUCONATE 4 % EX LIQD: 60.0000 mL | Freq: Once | CUTANEOUS | Status: AC

## 2018-11-11 SURGICAL SUPPLY — 18 items
CABLE SURGICAL S-101-97-12 (CABLE) ×2 IMPLANT
CATH ACUITYPRO 45CM H 9F (CATHETERS) ×1 IMPLANT
CATH ACUITYPRO IC 90 7F (CATHETERS) ×1
CATH GD CS-IC 90 CVD 60X7FR (CATHETERS) IMPLANT
CATH HEX JOS 2-5-2 65CM 6F REP (CATHETERS) ×1 IMPLANT
CRT-P PPM VALITUDE U128 (Pacemaker) ×2 IMPLANT
DEVICE CRT-P PPM VALITUDE U128 (Pacemaker) IMPLANT
INGEVITY MRI 7741-52CM (Lead) ×2 IMPLANT
INGEVITY MRI 7742-59CM (Lead) ×2 IMPLANT
LEAD ACUITY X4 4674 (Lead) ×1 IMPLANT
LEAD PACING INGEVITY MRI 52CM (Lead) IMPLANT
LEAD PACING INGEVITY MRI 59CM (Lead) IMPLANT
PAD PRO RADIOLUCENT 2001M-C (PAD) ×2 IMPLANT
SHEATH CLASSIC 7F (SHEATH) ×2 IMPLANT
SHEATH CLASSIC 9.5F (SHEATH) ×1 IMPLANT
TRAY PACEMAKER INSERTION (PACKS) ×2 IMPLANT
WIRE ACUITY WHISPER EDS 4648 (WIRE) ×1 IMPLANT
WIRE LUGE 182CM (WIRE) ×1 IMPLANT

## 2018-11-11 NOTE — Progress Notes (Signed)
  Echocardiogram 2D Echocardiogram has been performed.  Brittany Shaffer 11/11/2018, 10:20 AM

## 2018-11-11 NOTE — Progress Notes (Addendum)
[facility] TEAM  Patient Name: Brittany Shaffer Date of Encounter: 11/11/2018  Primary Cardiologist: Dr. [name] / Dr. [name] & Dr. Roxy Manns (TAVR)  Hospital Problem List     Principal Problem:   S/P TAVR (transcatheter aortic valve replacement) Active Problems:   Severe aortic stenosis   Dyslipidemia   Acute on chronic combined systolic (congestive) and diastolic (congestive) heart failure (HCC)   Mitral regurgitation   RBBB   HLD (hyperlipidemia)    Subjective   No complaints. Waiting for PPM placement today. Has many questions about when she can get back to driving and tennis and what medications she can take with plavix.   Inpatient Medications    Scheduled Meds: . aspirin  81 mg Oral Daily  . clopidogrel  75 mg Oral Q breakfast  . gentamicin irrigation  80 mg Irrigation On Call  . sodium chloride flush  3 mL Intravenous Q12H   Continuous Infusions: . sodium chloride    .  ceFAZolin (ANCEF) IV    . cefUROXime (ZINACEF)  IV Stopped (11/11/18 0016)  . nitroGLYCERIN    . phenylephrine (NEO-SYNEPHRINE) Adult infusion     PRN Meds: sodium chloride, acetaminophen **OR** acetaminophen, famotidine, metoprolol tartrate, morphine injection, ondansetron (ZOFRAN) IV, oxyCODONE, sodium chloride flush, traMADol, zolpidem   Vital Signs    Vitals:   11/11/18 0431 11/11/18 0500 11/11/18 0600 11/11/18 0809  BP: 122/64     Pulse: 65 (!) 57 (!) 57   Resp: 18 13 14    Temp:    98 F (36.7 C)  TempSrc:    Oral  SpO2: 98% 95% 95%   Weight:  68.6 kg    Height:        Intake/Output Summary (Last 24 hours) at 11/11/2018 0849 Last data filed at 11/11/2018 0400 Gross per 24 hour  Intake 2352.53 ml  Output 1500 ml  Net 852.53 ml   Filed Weights   11/10/18 0829 11/11/18 0500  Weight: 65.8 kg 68.6 kg    Physical Exam   GEN: Well nourished, well developed, in no acute distress.  HEENT: Grossly normal.  Neck: Supple, no JVD, carotid  bruits, or masses. Cardiac: RRR, no murmurs, rubs, or gallops. No clubbing, cyanosis, edema.  Radials/DP/PT 2+ and equal bilaterally.  Respiratory:  Respirations regular and unlabored, clear to auscultation bilaterally. GI: Soft, nontender, nondistended, BS + x 4. MS: no deformity or atrophy. Skin: warm and dry, no rash. Groin sites healing with with no hematoma or ecchymosis. Temp wire in place.  Neuro:  Strength and sensation are intact. Psych: AAOx3.  Normal affect.  Labs    CBC Recent Labs    11/09/18 0903  11/10/18 1329 11/11/18 0514  WBC 4.5  --   --  9.3  HGB 14.0   < > 11.2* 11.6*  HCT 43.2   < > 33.0* 35.4*  MCV 100.5*  --   --  98.6  PLT 138*  --   --  144*   < > = values in this interval not displayed.   Basic Metabolic Panel Recent Labs    11/09/18 0903  11/10/18 1329 11/11/18 0514  NA 138   < > 141 139  K 4.7   < > 4.0 3.9  CL 107  --   --  109  CO2 19*  --   --  23  GLUCOSE 106*   < > 105* 127*  BUN 21  --   --  17  CREATININE 1.02*  --   --  0.85  CALCIUM 9.6  --   --  8.7*  MG  --   --   --  1.9   < > = values in this interval not displayed.   Liver Function Tests Recent Labs    11/09/18 0903  AST 24  ALT 15  ALKPHOS 59  BILITOT 0.7  PROT 7.1  ALBUMIN 4.0   No results for input(s): LIPASE, AMYLASE in the last 72 hours. Cardiac Enzymes No results for input(s): CKTOTAL, CKMB, CKMBINDEX, TROPONINI in the last 72 hours. BNP Invalid input(s): POCBNP D-Dimer No results for input(s): DDIMER in the last 72 hours. Hemoglobin A1C Recent Labs    11/09/18 0903  HGBA1C 5.9*   Fasting Lipid Panel No results for input(s): CHOL, HDL, LDLCALC, TRIG, CHOLHDL, LDLDIRECT in the last 72 hours. Thyroid Function Tests No results for input(s): TSH, T4TOTAL, T3FREE, THYROIDAB in the last 72 hours.  Invalid input(s): FREET3  Telemetry    Sinus with alternating L/R bundles  - Personally Reviewed  ECG    Sinus with RBBB and TW inversions - Personally  Reviewed  Radiology    Dg Chest 2 View  Result Date: 11/09/2018 CLINICAL DATA:  Preop for TAVR EXAM: CHEST - 2 VIEW COMPARISON:  Chest CTs from 2020 and chest x-ray from 2019. FINDINGS: The heart is mildly enlarged but stable. Stable tortuosity and ectasia of the thoracic aorta. The lungs are clear of an acute process. No infiltrates or effusions. No worrisome pulmonary lesions. The bony thorax is intact. IMPRESSION: Stable mild cardiac enlargement with left ventricular configuration. Stable tortuosity and ectasia of the thoracic aorta. No acute pulmonary findings. Electronically Signed   By: Marijo Sanes M.D.   On: 11/09/2018 14:09   Dg Chest Port 1 View  Result Date: 11/10/2018 CLINICAL DATA:  Status post transcatheter aortic valve replacement today. EXAM: PORTABLE CHEST 1 VIEW COMPARISON:  PA and lateral chest 11/09/2018. FINDINGS: New aortic valve replacement is in place. Pacing lead from an inferior vena cava approach is identified with its tip in the right atrium. Lungs are clear. No pneumothorax or pleural effusion. Heart size is upper normal. Aortic atherosclerosis noted. No acute or focal bony abnormality. IMPRESSION: No acute disease. Lead of IVC approach pacing device projects in the right atrium. Electronically Signed   By: Inge Rise M.D.   On: 11/10/2018 15:13    Cardiac Studies   TAVR OPERATIVE NOTE   Date of Procedure:                11/10/2018  Preoperative Diagnosis:      Severe Aortic Stenosis   Postoperative Diagnosis:    Same   Procedure:        Transcatheter Aortic Valve Replacement - Percutaneous Right Transfemoral Approach             Edwards Sapien 3 THV (size 26 mm, model # 9600TFX, serial # [id number])              Co-Surgeons:                        Valentina Gu. Roxy Manns, MD and Sherren Mocha, MD  Anesthesiologist:                  Adele Barthel, MD  Echocardiographer:              Ena Dawley, MD  Pre-operative Echo Findings: ? Severe  aortic stenosis ? Moderate left ventricular systolic dysfunction  Post-operative Echo Findings: ? Mild paravalvular leak ? Unchanged left ventricular systolic function  _______________   Echo 11/11/18: pending    Patient Profile     CHAROLETT YARROW is a 73 y.o. female with a history of bicuspid Aov, mitral regurgitation, NICM, chronic combined S/D CHF, RBBB, HLD, degenerative arthritis and severe AS who presented to [facility] on 11/10[facility]18 for planned TAVR.   Assessment & Plan    Severe AS:s/p successful TAVR with a 26 mm Edwards Sapien 3 THV via the TF approach on 11/10/18. Post operative echo pending. Groin sites are stable with temp wire in place. ECG with sinus and RBBB. Continue Asprin and plavix.   CHB: she had a baseline RBBB prior to surgery and had intra-procedural CHB. Temp wire was left in place. She has had alternating L/R bundles on tele and occasional paced beats. Plan for PPM by Dr. Lovena Le today  Acute on chronic combined S/D CHF: as evidenced by an elevated BNP >500 on pre admission lab work. This has been treated with TAVR. Will resume home lasix and Entresto after PPM placement.  NICM: EF 25-30%, but improved to 25-40% on intra procedural echo yesterday. Resume medical therapy after pacemaker placement.    Signed, Angelena Form, PA-C  11/11/2018, 8:49 AM  Pager [phone]  I have seen and examined the patient and agree with the assessment and plan as outlined.  Rexene Alberts, MD 11/11/2018 9:41 AM

## 2018-11-11 NOTE — Interval H&P Note (Signed)
History and Physical Interval Note:  11/11/2018 4:52 PM  [name]  has presented today for surgery, with the diagnosis of heart block.  The various methods of treatment have been discussed with the patient and family. After consideration of risks, benefits and other options for treatment, the patient has consented to  Procedure(s): BIV PACEMAKER INSERTION CRT-P (N/A) as a surgical intervention.  The patient's history has been reviewed, patient examined, no change in status, stable for surgery.  I have reviewed the patient's chart and labs.  Questions were answered to the patient's satisfaction.     Brittany Shaffer

## 2018-11-11 NOTE — Progress Notes (Signed)
Left femoral sheath removed at 2007 with 2 RNs present. Pressure held for 20 minutes. Pt tolerated well, without complications, VS stable. No bleeding issues, site is Level 0. Pt on bedrest, RN will continue to monitor.

## 2018-11-12 ENCOUNTER — Inpatient Hospital Stay (HOSPITAL_COMMUNITY): Payer: PPO

## 2018-11-12 ENCOUNTER — Encounter (HOSPITAL_COMMUNITY): Payer: Self-pay | Admitting: Internal Medicine

## 2018-11-12 LAB — GLUCOSE, CAPILLARY
Glucose-Capillary: 107 mg/dL — ABNORMAL HIGH (ref 70–99)
Glucose-Capillary: 112 mg/dL — ABNORMAL HIGH (ref 70–99)

## 2018-11-12 LAB — BASIC METABOLIC PANEL
Anion gap: 5 (ref 5–15)
BUN: 15 mg/dL (ref 8–23)
CO2: 26 mmol/L (ref 22–32)
Calcium: 8.3 mg/dL — ABNORMAL LOW (ref 8.9–10.3)
Chloride: 107 mmol/L (ref 98–111)
Creatinine, Ser: 0.96 mg/dL (ref 0.44–1.00)
GFR calc Af Amer: >60 mL/min (ref >60)
GFR calc non Af Amer: 58 mL/min — ABNORMAL LOW (ref >60)
GLUCOSE: 108 mg/dL — AB (ref 70–99)
Potassium: 3.9 mmol/L (ref 3.5–5.1)
Sodium: 138 mmol/L (ref 135–145)

## 2018-11-12 LAB — CBC
HEMATOCRIT: 31 % — AB (ref 36.0–46.0)
Hemoglobin: 10.3 g/dL — ABNORMAL LOW (ref 12.0–15.0)
MCH: 32.9 pg (ref 26.0–34.0)
MCHC: 33.2 g/dL (ref 30.0–36.0)
MCV: 99 fL (ref 80.0–100.0)
Platelets: 121 10*3/uL — ABNORMAL LOW (ref 150–400)
RBC: 3.13 MIL/uL — ABNORMAL LOW (ref 3.87–5.11)
RDW: 13.2 % (ref 11.5–15.5)
WBC: 6.6 10*3/uL (ref 4.0–10.5)
nRBC: 0 % (ref 0.0–0.2)

## 2018-11-12 MED ORDER — CLOPIDOGREL BISULFATE 75 MG PO TABS: 75.0000 mg | ORAL_TABLET | Freq: Every day | ORAL | 1 refills | Status: DC

## 2018-11-12 MED ORDER — POTASSIUM CHLORIDE CRYS ER 20 MEQ PO TBCR: 10.0000 meq | EXTENDED_RELEASE_TABLET | Freq: Every day | ORAL | 3 refills | Status: DC

## 2018-11-12 NOTE — Progress Notes (Addendum)
Progress Note  Patient Name: Brittany Shaffer Date of Encounter: 11/12/2018  Primary Cardiologist: Dr. [name]  Subjective   Discomfort at [facility] site, much better after pressure completed and dressed, no CP or SOB  Inpatient Medications    Scheduled Meds: . aspirin EC  81 mg Oral Daily  . sodium chloride flush  3 mL Intravenous Q12H   Continuous Infusions: . sodium chloride 10 mL/hr at 11/12/18 0600  .  ceFAZolin (ANCEF) IV 1 g (11/12/18 0742)  . cefUROXime (ZINACEF)  IV Stopped (11/12/18 0048)  . nitroGLYCERIN    . phenylephrine (NEO-SYNEPHRINE) Adult infusion     PRN Meds: sodium chloride, [DISCONTINUED] acetaminophen **OR** acetaminophen, acetaminophen, famotidine, metoprolol tartrate, morphine injection, ondansetron (ZOFRAN) IV, ondansetron (ZOFRAN) IV, oxyCODONE, sodium chloride flush, traMADol, zolpidem   Vital Signs    Vitals:   11/12/18 0400 11/12/18 0500 11/12/18 0600 11/12/18 0700  BP: 113/63 (!) 105/59 (!) 99/58   Pulse: 60 (!) 57 (!) 59   Resp: 13 (!) 21 12   Temp: 97.6 F (36.4 C)   98 F (36.7 C)  TempSrc: Oral   Oral  SpO2: 91% 92% 91%   Weight:  69.3 kg    Height:        Intake/Output Summary (Last 24 hours) at 11/12/2018 1035 Last data filed at 11/12/2018 0600 Gross per 24 hour  Intake 518.09 ml  Output 350 ml  Net 168.09 ml   Last 3 Weights 11/12/2018 11/11/2018 11/10/2018  Weight (lbs) 152 lb 12.5 oz 151 lb 3.8 oz 145 lb  Weight (kg) 69.3 kg 68.6 kg 65.772 kg      Telemetry    SR - Personally Reviewed  ECG    SR, RBBB - Personally Reviewed  Physical Exam   GEN: No acute distress.   Neck: No JVD Cardiac: RRR, no murmurs, rubs, or gallops.  Respiratory: CTA b/l GI: Soft, nontender, non-distended  MS: No edema; No deformity. Neuro:  Nonfocal  Psych: Normal affect   Labs    Chemistry Recent Labs  Lab 11/09/18 0903  11/10/18 1329 11/11/18 0514 11/12/18 0314  NA 138   < > 141 139 138  K 4.7   < > 4.0 3.9 3.9  CL 107  --   --   109 107  CO2 19*  --   --  23 26  GLUCOSE 106*   < > 105* 127* 108*  BUN 21  --   --  17 15  CREATININE 1.02*  --   --  0.85 0.96  CALCIUM 9.6  --   --  8.7* 8.3*  PROT 7.1  --   --   --   --   ALBUMIN 4.0  --   --   --   --   AST 24  --   --   --   --   ALT 15  --   --   --   --   ALKPHOS 59  --   --   --   --   BILITOT 0.7  --   --   --   --   GFRNONAA 54*  --   --  >60 58*  GFRAA >60  --   --  >60 >60  ANIONGAP 12  --   --  7 5   < > = values in this interval not displayed.     Hematology Recent Labs  Lab 11/09/18 [id number]  11/10/18 1329 11/11/18 6967 11/12/18 8938  WBC 4.5  --   --  9.3 6.6  RBC 4.30  --   --  3.59* 3.13*  HGB 14.0   < > 11.2* 11.6* 10.3*  HCT 43.2   < > 33.0* 35.4* 31.0*  MCV 100.5*  --   --  98.6 99.0  MCH 32.6  --   --  32.3 32.9  MCHC 32.4  --   --  32.8 33.2  RDW 13.2  --   --  12.8 13.2  PLT 138*  --   --  144* 121*   < > = values in this interval not displayed.    Cardiac EnzymesNo results for input(s): TROPONINI in the last 168 hours. No results for input(s): TROPIPOC in the last 168 hours.   BNP Recent Labs  Lab 11/09/18 0903  BNP 570.3*     DDimer No results for input(s): DDIMER in the last 168 hours.   Radiology    Dg Chest 2 View Result Date: 11/12/2018 CLINICAL DATA:  Pacemaker EXAM: CHEST - 2 VIEW COMPARISON:  Two days ago FINDINGS: New biventricular pacer from the left. Right-sided leads project over the right atrial appendage and ventricular apex. Temporary pacer has been removed. Cardiomegaly. Mild interstitial coarsening with trace pleural effusion on the right. No pneumothorax. Stable aortic contours. IMPRESSION: 1. Biventricular pacer placement without complicating feature. 2. Borderline pulmonary edema.  Trace right pleural effusion Electronically Signed   By: Monte Fantasia M.D.   On: 11/12/2018 09:26    Cardiac Studies   11/10/2018; TTE (limited) IMPRESSIONS 1. The left ventricle has moderately reduced systolic  function, with an ejection fraction of 35-40%. The cavity size was normal. There is moderate concentric left ventricular hypertrophy. Left ventricular diffuse hypokinesis. 2. The right ventricle has normal systolc function. The cavity was normal. There is no increase in right ventricular wall thickness. 3. The mitral valve is degenerative. 4. The aortic valve is tricuspid Severely thickening of the aortic valve Severe calcifcation of the aortic valve. SUMMARY This was a periprocedural TTE during a TAVR procedure. LVEF was moderately decreased estimated at 35-40% with diffuse hypokinesis. Native aortic valve was severely thickened and calcified with severely restricted leaflet opening and transaortic gradients 63/36 mmHg. A 26 mm Edwards-SAPIEN 3 valve was successfully deployed in the aortic position with post-deployment gradients 7/4 mmHg and residual mild paravalvular leak. No pericardial effusion prior or post procedure. FINDINGS Left Ventricle: The left ventricle has moderately reduced systolic function, with an ejection fraction of 35-40%. The cavity size was normal. There is moderate concentric left ventricular hypertrophy. Left ventricular diffuse hypokinesis. Right Ventricle: The right ventricle has normal systolic function. The cavity was normal. There is no increase in right ventricular wall thickness. Left Atrium: Left atrial size was normal in size. Right Atrium: Right atrial size was normal in size. Right atrial pressure is estimated at 10 mmHg. Interatrial Septum: No atrial level shunt detected by color flow Doppler. Pericardium: There is no evidence of pericardial effusion. Mitral Valve: The mitral valve is degenerative in appearance. Mitral valve regurgitation is mild by color flow Doppler. Tricuspid Valve: Tricuspid valve regurgitation was not visualized by color flow Doppler. Aortic Valve: The aortic valve is tricuspid Severely thickening of the aortic valve Severe  calcifcation of the aortic valve. Aortic valve regurgitation was not visualized by color flow Doppler. Pulmonic Valve: Pulmonic valve regurgitation is not visualized by color flow Doppler. Venous: The inferior vena cava is normal in size with greater than 50% respiratory variability.  10/07/2018: TTE IMPRESSIONS 1. The left ventricle has severely reduced systolic function of 44-01%. The cavity size is normal. There is no increased left ventricular wall thickness. Echo evidence of restrictive diastolic relaxation Elevated mean left atrial pressure Left  ventricular diffuse hypokinesis. 2. The right ventricle has mildly reduced systolic function. The cavity in mildly enlarged in size. There is no increase in right ventricular wall thickness. Right ventricular systolic pressure is severely elevated. 3. Moderately dilated left atrial size. 4. Mildly dilated right atrial size. 5. The tricuspid valve is normal in structure. Tricuspid valve regurgitation is moderate-severe. 6. The aortic valve has an indeterminant number of cusps. There is moderate calcification of the aortic valve. Severe AS (mean gradient 25 mmHg and AVA 0.69 cm2). 7. The inferior vena cava was dilated in size with >50% respiratory variability. 8. Possible small oscillating density on anterior MV annulus vs aortic valve; consider TEE if clinically indicated. 9. Severe LV dysfunction; restrictive filling; calcified aortic valve with severe AS; biatrial enlargement; mild MR; mild RVE with mild RV dysfunction; moderate to severe TR with severe pulmonary hypertension; cannot R/O oscillating density as described above; consider TEE.  FINDINGS Left Ventricle: The left ventricle has severely reduced systolic function of 02-72%. The cavity size is normal. There is no increased left ventricular wall thickness. Echo evidence of restrictive diastolic relaxation Elevated mean left atrial pressure  Left ventricular diffuse  hypokinesis. There is akinesis of the entire inferolateral left ventricular segment. Right Ventricle: The right ventricle has mildly reduced systolic function. The cavity in mildly enlarged in size. There is no increase in right ventricular wall thickness. Right ventricular systolic pressure is severely elevated. Left Atrium: is moderately dilated Right Atrium: is mildly dilated Interatrial Septum: No atrial level shunt detected by color flow Doppler.  Pericardium: There is no evidence of pericardial effusion. Mitral Valve: The mitral valve is normal in structure There is mild thickening. There is mild mitral annular calcification present. Mitral valve regurgitation is mild to moderate by color flow Doppler. Tricuspid Valve: The tricuspid valve is normal in structure. Tricuspid valve regurgitation is moderate-severe by color flow Doppler. Aortic Valve: The aortic valve has an indeterminant number of cusps. There is moderate calcification of the aortic valve. Aortic valve regurgitation was not visualized by color flow Doppler. The calculated aortic valve area is 0.69 cm, consistent with  severe stenosis. Pulmonic Valve: The pulmonic valve is normal in structure. Pulmonic valve regurgitation is not visualized by color flow Doppler. Venous: The inferior vena cava was dilated in size with greater than 50% respiratory variability.   10/23/2018; LHC  Dist LAD lesion is 30% stenosed.  There is moderate left ventricular systolic dysfunction.  The left ventricular ejection fraction is 35-45% by visual estimate.  LV end diastolic pressure is normal.  There is moderate aortic valve stenosis.  1. Minimal nonobstructive CAD 2. Moderate LV dysfunction. EF 35-40%. 3. At least moderate AS. Mean gradient 27 mm Hg. AVA may be underestimated due to decreased LV function 4. Normal LV filling pressures. 5. Normal right heart pressures.  Plan: will refer to [facility] heart team for consideration of  TAVR.   Patient Profile     73 y.o. female with a hx of HLD, OA, non-obstructive CAD, NICM, chronic CHF (systolic), RBBB, and VHD w/severe AS s/p TAVR this admission with intra-procedure CHB  Assessment & Plan    1. VHD w/severe AS     S/p TAVR 2. Intra-procedure CHB     Baseline RBBB 3. NICM  LVEF by TTE 10/07/2018 was 25-30%, limited TEE during her procedure 11/10/2018 with LVEF 35-40%  S/p CRT-P implant yesterday Pocket hematoma noted this AM Pressure was held for 20 minutes with significant reduction in size, pressure dressing applied, patient tolerated well CXR this AM without ptx Device check this AM with stable measurements, intact function Wound care and activty restrictions were discussed with the patient  Pressure dressing to remain until Monday will see her in the office (patient is aware0 NO PLAVIX UNTIL MONDAY PLEASE D/w structural heart PA  EP follow up is in place We will sign off though remain available Please recall if needed  Further management with     For questions or updates, please contact [place] Please consult [email or web address] for contact info under   Signed, Baldwin Jamaica, PA-C  11/12/2018, 10:35 AM    EP Attending  Patient seen and examined. Agree with above. The patient is doing well after biv ppm insertion except for a pocket hematoma. She will have this compressed and bandaged. Interrogation of her device under my supervision demonstrates normal device function. She can be discharged home. I would like her plavix held for 4 days.   Mikle Bosworth.D.

## 2018-11-12 NOTE — Progress Notes (Signed)
Orthopedic Tech Progress Note Patient Details:  Brittany Shaffer 16-Jun-1946 [id number] RN said patient has on arm sling Patient ID: Brittany Shaffer, female   DOB: Jan 30, 1946, 73 y.o.   MRN: [id number]   Janit Pagan 11/12/2018, 8:11 AM

## 2018-11-12 NOTE — Plan of Care (Signed)
Patient has completed care plan goals for discharge.

## 2018-11-12 NOTE — Discharge Instructions (Signed)
Pacemaker Implantation, Adult, Care After This sheet gives you information about how to care for yourself after your procedure. Your health care provider may also give you more specific instructions. If you have problems or questions, contact your health care provider. What can I expect after the procedure? After the procedure, it is common to have:  Mild pain.  Slight bruising.  Some swelling over the incision.  A slight bump over the skin where the device was placed. Sometimes, it is possible to feel the device under the skin. This is normal. Follow these instructions at home: Medicines  Take over-the-counter and prescription medicines only as told by your health care provider.  If you were prescribed an antibiotic medicine, take it as told by your health care provider. Do not stop taking the antibiotic even if you start to feel better. Wound care   Do not remove the bandage on your chest until directed to do so by your health care provider.  After your bandage is removed, you may see pieces of tape called skin adhesive strips over the area where the cut was made (incision site). Let them fall off on their own.  Check the incision site every day to make sure it is not infected, bleeding, or starting to pull apart.  Do not use lotions or ointments near the incision site unless directed to do so.  Keep the incision area clean and dry for 2-3 days after the procedure or as directed by your health care provider. It takes several weeks for the incision site to completely heal.  Do not take baths, swim, or use a hot tub for 7-10 days or as otherwise directed by your health care provider. Activity  Do not drive or use heavy machinery while taking prescription pain medicine.  Do not drive for 24 hours if you were given a medicine to help you relax (sedative).  Check with your health care provider before you start to drive or play sports.  Avoid sudden jerking, pulling, or  chopping movements that pull your upper arm far away from your body. Avoid these movements for at least 6 weeks or as long as told by your health care provider.  Do not lift your upper arm above your shoulders for at least 6 weeks or as long as told by your health care provider. This means no tennis, golf, or swimming.  You may go back to work when your health care provider says it is okay. Pacemaker care  You may be shown how to transfer data from your pacemaker through the phone to your health care provider.  Always let all health care providers know about your pacemaker before you have any medical procedures or tests.  Wear a medical ID bracelet or necklace stating that you have a pacemaker. Carry a pacemaker ID card with you at all times.  Your pacemaker battery will last for 5-15 years. Routine checks by your health care provider will let the health care provider know when the battery is starting to run down. The pacemaker will need to be replaced when the battery starts to run down.  Do not use amateur Chief of Staff. Other electrical devices are safe to use, including power tools, lawn mowers, and speakers. If you are unsure of whether something is safe to use, ask your health care provider.  When using your cell phone, hold it to the ear opposite the pacemaker. Do not leave your cell phone in a pocket over the  pacemaker.  Avoid places or objects that have a strong electric or magnetic field, including: ? Airport Herbalist. When at the airport, let officials know that you have a pacemaker. ? Power plants. ? Large electrical generators. ? Radiofrequency transmission towers, such as cell phone and radio towers. General instructions  Weigh yourself every day. If you suddenly gain weight, fluid may be building up in your body.  Keep all follow-up visits as told by your health care provider. This is important. Contact a health care provider if:  You  gain weight suddenly.  Your legs or feet swell.  It feels like your heart is fluttering or skipping beats (heart palpitations).  You have chills or a fever.  You have more redness, swelling, or pain around your incisions.  You have more fluid or blood coming from your incisions.  Your incisions feel warm to the touch.  You have pus or a bad smell coming from your incisions. Get help right away if:  You have chest pain.  You have trouble breathing or are short of breath.  You become extremely tired.  You are light-headed or you faint. This information is not intended to replace advice given to you by your health care provider. Make sure you discuss any questions you have with your health care provider. Document Released: 03/08/2005 Document Revised: 05/31/2016 Document Reviewed: 05/31/2016 Elsevier Interactive Patient Education  2019 [place].   Pacemaker Implantation, Adult, Care After This sheet gives you information about how to care for yourself after your procedure. Your health care provider may also give you more specific instructions. If you have problems or questions, contact your health care provider. What can I expect after the procedure? After the procedure, it is common to have:  Mild pain.  Slight bruising.  Some swelling over the incision.  A slight bump over the skin where the device was placed. Sometimes, it is possible to feel the device under the skin. This is normal. Follow these instructions at home: Medicines  Take over-the-counter and prescription medicines only as told by your health care provider.  If you were prescribed an antibiotic medicine, take it as told by your health care provider. Do not stop taking the antibiotic even if you start to feel better. Wound care   Do not remove the bandage on your chest until directed to do so by your health care provider.  After your bandage is removed, you may see pieces of tape called skin  adhesive strips over the area where the cut was made (incision site). Let them fall off on their own.  Check the incision site every day to make sure it is not infected, bleeding, or starting to pull apart.  Do not use lotions or ointments near the incision site unless directed to do so.  Keep the incision area clean and dry for 2-3 days after the procedure or as directed by your health care provider. It takes several weeks for the incision site to completely heal.  Do not take baths, swim, or use a hot tub for 7-10 days or as otherwise directed by your health care provider. Activity  Do not drive or use heavy machinery while taking prescription pain medicine.  Do not drive for 24 hours if you were given a medicine to help you relax (sedative).  Check with your health care provider before you start to drive or play sports.  Avoid sudden jerking, pulling, or chopping movements that pull your upper arm far away from your  body. Avoid these movements for at least 6 weeks or as long as told by your health care provider.  Do not lift your upper arm above your shoulders for at least 6 weeks or as long as told by your health care provider. This means no tennis, golf, or swimming.  You may go back to work when your health care provider says it is okay. Pacemaker care  You may be shown how to transfer data from your pacemaker through the phone to your health care provider.  Always let all health care providers know about your pacemaker before you have any medical procedures or tests.  Wear a medical ID bracelet or necklace stating that you have a pacemaker. Carry a pacemaker ID card with you at all times.  Your pacemaker battery will last for 5-15 years. Routine checks by your health care provider will let the health care provider know when the battery is starting to run down. The pacemaker will need to be replaced when the battery starts to run down.  Do not use amateur Nurse, children's. Other electrical devices are safe to use, including power tools, lawn mowers, and speakers. If you are unsure of whether something is safe to use, ask your health care provider.  When using your cell phone, hold it to the ear opposite the pacemaker. Do not leave your cell phone in a pocket over the pacemaker.  Avoid places or objects that have a strong electric or magnetic field, including: ? Airport Herbalist. When at the airport, let officials know that you have a pacemaker. ? Power plants. ? Large electrical generators. ? Radiofrequency transmission towers, such as cell phone and radio towers. General instructions  Weigh yourself every day. If you suddenly gain weight, fluid may be building up in your body.  Keep all follow-up visits as told by your health care provider. This is important. Contact a health care provider if:  You gain weight suddenly.  Your legs or feet swell.  It feels like your heart is fluttering or skipping beats (heart palpitations).  You have chills or a fever.  You have more redness, swelling, or pain around your incisions.  You have more fluid or blood coming from your incisions.  Your incisions feel warm to the touch.  You have pus or a bad smell coming from your incisions. Get help right away if:  You have chest pain.  You have trouble breathing or are short of breath.  You become extremely tired.  You are light-headed or you faint. This information is not intended to replace advice given to you by your health care provider. Make sure you discuss any questions you have with your health care provider. Document Released: 03/08/2005 Document Revised: 05/31/2016 Document Reviewed: 05/31/2016 Elsevier Interactive Patient Education  2019 [place] Discharge Instructions for  Pacemaker/Defibrillator Patients  Activity No heavy lifting or vigorous activity with your left/right arm for 6 to 8  weeks.  Do not raise your left/right arm above your head for one week.  Gradually raise your affected arm as drawn below.              11/15/2018                 11/16/2018                11/17/2018               11/18/2018 __  NO  DRIVING for  1 week   ; you may begin driving on  0/62/3762 (or as per post valve replacement care instructions, whichever is longer)  .  WOUND CARE - Keep the wound area clean and dry.  Do not get this area wet for one week. No showers for one week; you may shower on 11/18/2018 (or as per post valve replacement care instructions, whichever is longer)  . - The tape/steri-strips on your wound will fall off; do not pull them off.  No bandage is needed on the site.  DO  NOT apply any creams, oils, or ointments to the wound area. - If you notice any drainage or discharge from the wound, any swelling or bruising at the site, or you develop a fever > 101? F after you are discharged home, call the office at once.  Special Instructions - You are still able to use cellular telephones; use the ear opposite the side where you have your pacemaker/defibrillator.  Avoid carrying your cellular phone near your device. - When traveling through airports, show security personnel your identification card to avoid being screened in the metal detectors.  Ask the security personnel to use the hand wand. - Avoid arc welding equipment, MRI testing (magnetic resonance imaging), TENS units (transcutaneous nerve stimulators).  Call the office for questions about other devices. - Avoid electrical appliances that are in poor condition or are not properly grounded. - Microwave ovens are safe to be near or to operate.  ACTIVITY AND EXERCISE  Daily activity and exercise are an important part of your recovery. People recover at different rates depending on their general health and type of valve procedure.  Most people recovering from TAVR feel better relatively quickly   No lifting, pushing, pulling  more than 10 pounds (examples to avoid: groceries, vacuuming, gardening, golfing):             - For one week with a procedure through the groin.             - For six weeks for procedures through the chest wall or neck NOTE: You will typically see one of our providers 7-14 days after your procedure to discuss [place] the above activities.      DRIVING  Do not drive for until you are seen for follow up and cleared by a provider. Generally, we ask patient to not drive for 1 week after their procedure.  If you have been told by your doctor in the past that you may not drive, you must talk with him/her before you begin driving again.     DRESSING  Groin site: you may leave the clear dressing over the site for up to one week or until it falls off.     HYGIENE  If you had a femoral (leg) procedure, you may take a shower when you return home. After the shower, pat the site dry. Do NOT use powder, oils or lotions in your groin area until the site has completely healed.  If you had a chest procedure, you may shower when you return home unless specifically instructed not to by your discharging practitioner.             - DO NOT scrub incision; pat dry with a towel             - DO NOT apply any lotions, oils, powders to the incision             - No tub  baths / swimming for at least 2 weeks.  If you notice any fevers, chills, increased pain, swelling, bleeding or pus, please contact your doctor.   ADDITIONAL INFORMATION  If you are going to have an upcoming dental procedure, please contact our office as you will require antibiotics ahead of time to prevent infection on your heart valve.    If you have any questions or concerns you can call the structural heart phone during normal business hours 8am-4pm. If you have an urgent need after hours or weekends please call [phone] to talk to the on call provider for general cardiology. If you have an emergency that requires immediate  attention, please call 911.    After TAVR Checklist  Check  Test Description   Follow up appointment in 1-2 weeks  You will see our structural heart physician assistant, Nell Range. Your incision sites will be checked and you will be cleared to drive and resume all normal activities if you are doing well.     1 month echo and follow up  You will have an echo to check on your new heart valve and be seen back in the office by Nell Range. Many times the echo is not read by your appointment time, but Joellen Jersey will call you later that day or the following day to report your results.   Follow up with your primary cardiologist You will need to be seen by your primary cardiologist in the following 3-6 months after your 1 month appointment in the valve clinic. Often times your Plavix or Aspirin will be discontinued during this time, but this is decided on a case by case basis.    1 year echo and follow up You will have another echo to check on your heart valve after 1 year and be seen back in the office by Nell Range. This your last structural heart visit.   Bacterial endocarditis prophylaxis  You will have to take antibiotics for the rest of your life before all dental procedures (even teeth cleanings) to protect your heart valve. Antibiotics are also required before some surgeries. Please check with your cardiologist before scheduling any surgeries. Also, please make sure to tell us if you have a penicillin allergy as you will require an alternative antibiotic.

## 2018-11-12 NOTE — Progress Notes (Signed)
CARDIAC REHAB PHASE I   PRE:  Rate/Rhythm: 65 pacing    BP: sitting 123/63    SaO2: 96 RA  MODE:  Ambulation: 660 ft   POST:  Rate/Rhythm: 92 pacing    BP: sitting 132/71     SaO2: 97 RA  Pt moving very well. Quick pace, felt good walking. Only c/o is pacer site pain/soreness. Discussed restrictions for site and groins. Gave ex gl and discussed CRPII. Will refer to [place], pt is thinking about it. [place], ACSM 11/12/2018 12:15 PM

## 2018-11-12 NOTE — Discharge Summary (Addendum)
[name]  [facility]  Discharge Summary    Patient ID: TENAE Shaffer MRN: [id number]; DOB: [birth year]  Admit date: 11/10/2018 Discharge date: 11/12/2018  Primary Care Provider: [name], MD  Primary Cardiologist:  Dr. [name] / Dr. [name] & Dr. Roxy Manns (TAVR)  Discharge Diagnoses    Principal Problem:   S/P TAVR (transcatheter aortic valve replacement) Active Problems:   Severe aortic stenosis   Dyslipidemia   Acute on chronic combined systolic (congestive) and diastolic (congestive) heart failure (HCC)   Mitral regurgitation   RBBB   HLD (hyperlipidemia)   Allergies Allergies  Allergen Reactions   Codeine Other (See Comments)    Hallucinations    Diagnostic Studies/Procedures    TAVR OPERATIVE NOTE   Date of Procedure:11/10/2018  Preoperative Diagnosis:Severe Aortic Stenosis   Postoperative Diagnosis:Same   Procedure:   Transcatheter Aortic Valve Replacement - PercutaneousRightTransfemoral Approach Edwards Sapien 3 THV (size 24mm, model # 9600TFX, serial # [id number])  Co-Surgeons:Clarence H. Roxy Manns, MD and Sherren Mocha, MD  Anesthesiologist:Ryan Roanna Banning, MD  Dala Dock, MD  Pre-operative Echo Findings: ? Severe aortic stenosis ? Moderateleft ventricular systolicdysfunction  Post-operative Echo Findings: ? Mildparavalvular leak ? Unchangedleft ventricular systolic function  _______________  Echo 11/11/18: IMPRESSIONS  1. A 26 an Edwards Edwards Sapien bioprosthetic aortic valve (TAVR) valve is present in the aortic position. Procedure Date: 11/10/2018 Normal aortic valve prosthesis. Echo findings are consistent with mild perivalvular regurgitation at former RCC of the  aortic prosthesis.  2. Aortic valve regurgitation is mild by color flow Doppler.  3. The left  ventricle has mild-moderately reduced systolic function, with an ejection fraction of 40-45%. The cavity size was normal. There is moderate concentric left ventricular hypertrophy. Left ventricular diastolic Doppler parameters are consistent  with pseudonormal. Left ventricular diffuse hypokinesis.  4. The right ventricle has normal systolc function. The cavity was normal. There is no increase in right ventricular wall thickness.  5. The mitral valve is degenerative. Mild thickening of the mitral valve leaflet. Mild calcification of the mitral valve leaflet.  6. Left atrial size was moderately dilated.  7. Right atrial size was mildly dilated.  _______________  11/12/18 Procedures BIV PACEMAKER INSERTION CRT-P  Conclusion CONCLUSIONS:  1. Successful implantation of a [place] Sci BiV pacemaker for symptomatic Complete AV block  2. No early apparent complications.   Brittany Peru, MD  6:29 PM 11/11/2018     History of Present Illness     Brittany Shaffer is a 73 y.o. female with a history of bicuspid Aov, mitral regurgitation, NICM, chronic combined S/D CHF, RBBB, HLD, degenerative arthritis and severe AS who presented to [facility] on 11/10/18 for planned TAVR.   Patient states that she has been told that she had a heart murmur since her youth.  She was evaluated when she went to college and told that there might of been question that she had had rheumatic fever as a child.  She has never been seen by a cardiologist until recently.  In May 2019 she developed new onset of symptoms of exertional shortness of breath and chest tightness.  Cardiac troponins were negative and EKG revealed baseline chronic right bundle branch block without acute ST or T wave changes.  She underwent a transthoracic echocardiogram that revealed normal left ventricular function with ejection fraction estimated 50 to 55%, moderate aortic stenosis, and mild mitral regurgitation.  The left atrium was severely dilated..  Peak velocity  across the aortic valve  was reported 2.9 m/s corresponding to mean transvalvular gradient estimated 21 mmHg and aortic valve area calculated 0.75 cm.  She was hospitalized and evaluated by Dr. [name].  She underwent diagnostic cardiac catheterization by Dr. [name].  She was noted to have mild coronary artery disease with 50% stenosis of the distal left anterior descending coronary artery.  Mean transvalvular gradient across the aortic valve was measured 7 mmHg corresponding to aortic valve area calculated 2.7 cm.  She was discharged home and she states that she was told that no further cardiac follow-up was necessary and she was to follow-up with her primary care physician.  Since that time the patient has continued to complain of exertional shortness of breath. Previously the patient had remained remarkably active physically.  She enjoys playing tennis and golf and she walks on a regular basis.  She developed progressive symptoms of exertional shortness of breath, orthopnea, and lower extremity edema.  She was evaluated by pulmonologist and found to have no evidence of significant lung disease.  CT scan of the chest revealed mild pulmonary edema.  She asked her primary care physician to refer her back for cardiology consultation and she was evaluated by Dr. [name] October 05, 2018.  Follow-up echocardiogram performed October 07, 2018 revealed significant drop in left ventricular systolic function with ejection fraction estimated only 25 to 30%.  There was felt to be low flow, low ejection fraction, severe aortic stenosis.  Peak velocity across the aortic valve was measured greater than 3.1 m/s corresponding to mean transvalvular gradient estimated 25 mmHg.  The DVI was reported 0.22 with aortic valve area calculated 0.69 cm.  There was reportedly mild to moderate mitral regurgitation although regurgitant fraction and ERO were not reported.  She subsequently underwent repeat diagnostic cardiac catheterization  on October 23, 2018.  This confirmed the presence of mild nonobstructive coronary artery disease.  There was felt to be moderate left ventricular systolic dysfunction with ejection fraction estimated 35 to 45%.  Mean transvalvular gradient across the aortic valve was measured 27 mmHg.  Right heart pressures were normal.    The patient has been evaluated by the multidisciplinary valve team and felt to have severe, symptomatic aortic stenosis and to be a suitable candidate for TAVR, which was set up for 11/10/18.    Hospital Course     Consultants: EP   Bicuspid AoV with severe AS:s/p successful TAVR with a87mm Edwards Sapien 3 THV via the TF approach on 11/10/18. Post operative echo showed EF 40-45%, normally functioning TAVR with mean gradient of 15.38mm Hg and mild PVL. Groin sites are stable. ECG shows paced rhythm.Continue Asprin andplavix, but plavix to be held until Monday 3/16 given PPM.   CHB: she had a baseline RBBB prior to surgery and had intra-procedural CHB. Temp wire was left in place. She has had alternating L/R bundles on tele and occasional paced beats. She underwent successful CRT-P by Dr. Lovena Le on 11/11/18. She did develop a pocket hematoma that required compression and now in a sling. Having some pain. She has some tramadol at home that she will try. Does not want opiates as she has had some nausea.   Acute on chronic combined S/D CHF: as evidenced by an elevated BNP >500 on pre admission lab work and some mild pulmonary edema on CXR. Plan to resume home lasix and entresto today.   NICM: EF 25-30%, but improved to 40-45% on post op echo. Will resume home lasix and entresto  Mitral regurgitation: MR mild  on post op echo  _____________  Discharge Vitals Blood pressure (!) 114/58, pulse 67, temperature (!) 97.2 F (36.2 C), temperature source Oral, resp. rate 16, height 5' 7.5" (1.715 m), weight 69.3 kg, SpO2 94 %.  Filed Weights   11/10/18 0829 11/11/18 0500 11/12/18  0500  Weight: 65.8 kg 68.6 kg 69.3 kg   VS:  BP (!) 114/58    Pulse 67    Temp (!) 97.2 F (36.2 C) (Oral)    Resp 16    Ht 5' 7.5" (1.715 m)    Wt 69.3 kg    SpO2 94%    BMI 23.58 kg/m    GEN: Well nourished, well developed, in no acute distress HEENT: normal Neck: no JVD or masses Cardiac: RRR; no murmurs, rubs, or gallops,no edema  Respiratory:  clear to auscultation bilaterally, normal work of breathing GI: soft, nontender, nondistended, + BS MS: no deformity or atrophy Skin: warm and dry, no rash. Groin sites without hematoma or ecchymosis. Left arm in sling.  Neuro:  Alert and Oriented x 3, Strength and sensation are intact Psych: euthymic mood, full affect   Labs & Radiologic Studies    CBC Recent Labs    11/11/18 0514 11/12/18 0314  WBC 9.3 6.6  HGB 11.6* 10.3*  HCT 35.4* 31.0*  MCV 98.6 99.0  PLT 144* 702*   Basic Metabolic Panel Recent Labs    11/11/18 0514 11/12/18 0314  NA 139 138  K 3.9 3.9  CL 109 107  CO2 23 26  GLUCOSE 127* 108*  BUN 17 15  CREATININE 0.85 0.96  CALCIUM 8.7* 8.3*  MG 1.9  --    Liver Function Tests No results for input(s): AST, ALT, ALKPHOS, BILITOT, PROT, ALBUMIN in the last 72 hours. No results for input(s): LIPASE, AMYLASE in the last 72 hours. Cardiac Enzymes No results for input(s): CKTOTAL, CKMB, CKMBINDEX, TROPONINI in the last 72 hours. BNP Invalid input(s): POCBNP D-Dimer No results for input(s): DDIMER in the last 72 hours. Hemoglobin A1C No results for input(s): HGBA1C in the last 72 hours. Fasting Lipid Panel No results for input(s): CHOL, HDL, LDLCALC, TRIG, CHOLHDL, LDLDIRECT in the last 72 hours. Thyroid Function Tests No results for input(s): TSH, T4TOTAL, T3FREE, THYROIDAB in the last 72 hours.  Invalid input(s): FREET3 _____________  Dg Chest 2 View  Result Date: 11/12/2018 CLINICAL DATA:  Pacemaker EXAM: CHEST - 2 VIEW COMPARISON:  Two days ago FINDINGS: New biventricular pacer from the left.  Right-sided leads project over the right atrial appendage and ventricular apex. Temporary pacer has been removed. Cardiomegaly. Mild interstitial coarsening with trace pleural effusion on the right. No pneumothorax. Stable aortic contours. IMPRESSION: 1. Biventricular pacer placement without complicating feature. 2. Borderline pulmonary edema.  Trace right pleural effusion Electronically Signed   By: Monte Fantasia M.D.   On: 11/12/2018 09:26   Dg Chest 2 View  Result Date: 11/09/2018 CLINICAL DATA:  Preop for TAVR EXAM: CHEST - 2 VIEW COMPARISON:  Chest CTs from 2020 and chest x-ray from 2019. FINDINGS: The heart is mildly enlarged but stable. Stable tortuosity and ectasia of the thoracic aorta. The lungs are clear of an acute process. No infiltrates or effusions. No worrisome pulmonary lesions. The bony thorax is intact. IMPRESSION: Stable mild cardiac enlargement with left ventricular configuration. Stable tortuosity and ectasia of the thoracic aorta. No acute pulmonary findings. Electronically Signed   By: Marijo Sanes M.D.   On: 11/09/2018 14:09   Ct Coronary Morph W/cta  Cor W/score W/ca W/cm &/or Wo/cm  Addendum Date: 11/02/2018   ADDENDUM REPORT: 11/02/2018 12:07 CLINICAL DATA:  Aortic stenosis EXAM: Cardiac TAVR CT TECHNIQUE: The patient was scanned on a Siemens Force 989 slice scanner. A 120 kV retrospective scan was triggered in the descending thoracic aorta at 111 HU's. Gantry rotation speed was 270 msecs and collimation was .9 mm. No beta blockade or nitro were given. The 3D data set was reconstructed in 5% intervals of the R-R cycle. Systolic and diastolic phases were analyzed on a dedicated work station using MPR, MIP and VRT modes. The patient received 80 cc of contrast. FINDINGS: Aortic Valve: Bi cuspid with no raphe. Calcified with restricted leaflet motion Aorta: Pseudo coarctation distal to the left subclavian minimal luminal diameter 2.4 cm Sinotubular Junction: 25 mm Ascending Thoracic  Aorta: 28 mm Aortic Arch: 26 mm Descending Thoracic Aorta: 25 mm Sinus of Valsalva Measurements: Commissural 25 mm Orthogonal 27.6 mm Coronary Artery Height above Annulus: Left Main: 13.8 mm Right Coronary: 15.2 mm Virtual Basal Annulus Measurements: Maximum/Minimum Diameter: 28.1 mm x 22.9 mm Perimeter: 81.8 mm Area: 463 mm2 Coronary Arteries: Sufficient height above annulus for deployment Optimum Fluoroscopic Angle for Delivery: RAO 11 Caudal 16 degrees IMPRESSION: 1. Bicuspid AV with annular area of 463 mm2 suitable for a 26 mm Sapien 3 valve 2. Optimum angiographic angle for deployment RAO 11 Caudal 16 degrees 3.  Coronary arteries suitable height above annulus for deployment 4. Pseudo Coarctation distal to the left subclavian MLD 2.4 cm should be ok for delivery system Jenkins Rouge Electronically Signed   By: Jenkins Rouge M.D.   On: 11/02/2018 12:07   Result Date: 11/02/2018 EXAM: OVER-READ INTERPRETATION  CT CHEST The following report is an over-read performed by radiologist Dr. Vinnie Langton of [facility], [place] on 11/02/2018. This over-read does not include interpretation of cardiac or coronary anatomy or pathology. The coronary calcium score/coronary CTA interpretation by the cardiologist is attached. COMPARISON:  Chest CT 10/05/2018. FINDINGS: Extracardiac findings will be described separately under dictation for contemporaneously obtained CTA chest, abdomen and pelvis. IMPRESSION: Please see separate dictation for contemporaneously obtained CTA chest, abdomen and pelvis dated 11/02/2018 for full description of relevant extracardiac findings. Electronically Signed: By: Vinnie Langton M.D. On: 11/02/2018 11:15   Dg Chest Port 1 View  Result Date: 11/10/2018 CLINICAL DATA:  Status post transcatheter aortic valve replacement today. EXAM: PORTABLE CHEST 1 VIEW COMPARISON:  PA and lateral chest 11/09/2018. FINDINGS: New aortic valve replacement is in place. Pacing lead from an inferior vena cava  approach is identified with its tip in the right atrium. Lungs are clear. No pneumothorax or pleural effusion. Heart size is upper normal. Aortic atherosclerosis noted. No acute or focal bony abnormality. IMPRESSION: No acute disease. Lead of IVC approach pacing device projects in the right atrium. Electronically Signed   By: Inge Rise M.D.   On: 11/10/2018 15:13   Ct Angio Chest Aorta W &/or Wo Contrast  Result Date: 11/02/2018 CLINICAL DATA:  73 year old female with history of severe aortic stenosis. Preprocedural study prior to potential transcatheter aortic valve replacement (TAVR) procedure. EXAM: CT ANGIOGRAPHY CHEST, ABDOMEN AND PELVIS TECHNIQUE: Multidetector CT imaging through the chest, abdomen and pelvis was performed using the standard protocol during bolus administration of intravenous contrast. Multiplanar reconstructed images and MIPs were obtained and reviewed to evaluate the vascular anatomy. CONTRAST:  76mL ISOVUE-370 IOPAMIDOL (ISOVUE-370) INJECTION 76% COMPARISON:  Chest CT 10/05/2018. FINDINGS: CTA CHEST FINDINGS Cardiovascular: Heart size is mildly  enlarged. There is no significant pericardial fluid, thickening or pericardial calcification. There is aortic atherosclerosis, as well as atherosclerosis of the great vessels of the mediastinum and the coronary arteries, including calcified atherosclerotic plaque in the left anterior descending coronary artery. Severe thickening calcification of the aortic valve. Mediastinum/Lymph Nodes: No pathologically enlarged mediastinal or hilar lymph nodes. Esophagus is unremarkable in appearance. No axillary lymphadenopathy. Lungs/Pleura: Linear areas of scarring in the left lower lobe and right middle lobe. No acute consolidative airspace disease. No pleural effusions. No suspicious appearing pulmonary nodules or masses. Musculoskeletal/Soft Tissues: There are no aggressive appearing lytic or blastic lesions noted in the visualized portions of the  skeleton. CTA ABDOMEN AND PELVIS FINDINGS Hepatobiliary: No suspicious cystic or solid hepatic lesions. No intra or extrahepatic biliary ductal dilatation. Gallbladder is normal in appearance. Pancreas: No pancreatic mass. No pancreatic ductal dilatation. No pancreatic or peripancreatic fluid or inflammatory changes. Spleen: Unremarkable. Adrenals/Urinary Tract: Subcentimeter low-attenuation lesion in the anterior aspect of the interpolar region of the left kidney, too small to characterize, but statistically likely a tiny cyst. Right kidney and bilateral adrenal glands are normal in appearance. No hydroureteronephrosis. Urinary bladder is partially obscured by beam hardening artifact from the patient's bilateral hip arthroplasties, but visualized portions are generally unremarkable. Stomach/Bowel: Normal appearance of the stomach. No pathologic dilatation of small bowel or colon. Normal appendix. Vascular/Lymphatic: Aortic atherosclerosis, with vascular findings and measurements pertinent to potential TAVR procedure, as detailed above. 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 No lymphadenopathy noted in the abdomen or pelvis. Reproductive: Status post hysterectomy. Ovaries are not confidently identified may be obscured by beam hardening artifact, or may be surgically absent or atrophic. Other: No significant volume of ascites.  No pneumoperitoneum. Musculoskeletal: Status post bilateral hip arthroplasty. There are no aggressive appearing lytic or blastic lesions noted in the visualized portions of the skeleton. VASCULAR MEASUREMENTS PERTINENT TO TAVR: AORTA: Minimal Aortic Diameter-20 x 17 mm Severity of Aortic Calcification-mild RIGHT PELVIS: Right Common Iliac Artery - Minimal Diameter-10.9 x 9.9 mm Tortuosity-moderate Calcification-mild Right External Iliac Artery - Minimal Diameter-10.1 x 8.6 mm Tortuosity-mild-to-moderate Calcification-none Right Common  Femoral Artery - Minimal Diameter-8.1 x 8.5 mm Tortuosity-mild Calcification-none LEFT PELVIS: Left Common Iliac Artery - Minimal Diameter-13.7 x 12.1 mm Tortuosity-mild Calcification-mild Left External Iliac Artery - Minimal Diameter-9.0 x 8.7 mm Tortuosity-severe Calcification-none Left Common Femoral Artery - Minimal Diameter-8.6 x 8.0 mm Tortuosity-mild Calcification-none Review of the MIP images confirms the above findings. IMPRESSION: 1. Vascular findings and measurements pertinent to potential TAVR procedure, as detailed above. 2. Thickening calcification of the aortic valve, compatible with the reported clinical history of severe aortic stenosis. 3. Aortic atherosclerosis, in addition to left anterior descending coronary artery disease. 4. Mild cardiomegaly. 5. Additional incidental findings, as above. Electronically Signed   By: Vinnie Langton M.D.   On: 11/02/2018 12:24   Vas US Carotid  Result Date: 10/30/2018 Carotid Arterial Duplex Study Indications:   Pre TAVR. Other Factors: Severe AS. Performing Technologist: Toma Copier RVS  Examination Guidelines: A complete evaluation includes B-mode imaging, spectral Doppler, color Doppler, and power Doppler as needed of all accessible portions of each vessel. Bilateral testing is considered an integral part of a complete examination. Limited examinations for reoccurring indications may be performed as noted.  Right Carotid Findings: +----------+--------+--------+--------+--------+-------------------------+             PSV cm/s EDV cm/s Stenosis Describe Comments                   +----------+--------+--------+--------+--------+-------------------------+  CCA Prox                                       mild intimal wall changes  +----------+--------+--------+--------+--------+-------------------------+  CCA Distal                                     mild intimal wall changes  +----------+--------+--------+--------+--------+-------------------------+   ICA Prox   25       10                                                    +----------+--------+--------+--------+--------+-------------------------+  ICA Mid    48       22                                                    +----------+--------+--------+--------+--------+-------------------------+  ICA Distal 54       21                         tortuous                   +----------+--------+--------+--------+--------+-------------------------+  ECA        32       6                          mild intimal wall changes  +----------+--------+--------+--------+--------+-------------------------+ +----------+--------+-------+--------+-------------------+             PSV cm/s EDV cms Describe Arm Pressure (mmHG)  +----------+--------+-------+--------+-------------------+  Subclavian 38                                             +----------+--------+-------+--------+-------------------+ +---------+--------+--+--------+--+  Vertebral PSV cm/s 32 EDV cm/s 12  +---------+--------+--+--------+--+  Left Carotid Findings: +----------+--------+--------+--------+----------------------+-----------------+             PSV cm/s EDV cm/s Stenosis Describe               Comments           +----------+--------+--------+--------+----------------------+-----------------+  CCA Prox   55       17                                       mild intimal wall                                                                changes            +----------+--------+--------+--------+----------------------+-----------------+  CCA Distal 44       14  mild ntimal wall                                                                 changes            +----------+--------+--------+--------+----------------------+-----------------+  ICA Prox   25       10                                                          +----------+--------+--------+--------+----------------------+-----------------+  ICA Mid    38       18                                                           +----------+--------+--------+--------+----------------------+-----------------+  ICA Distal 38       16                                       tortuous           +----------+--------+--------+--------+----------------------+-----------------+  ECA        53       12                focal and heterogenous minimal plaque on                                                                the far wall at                                                                  the origin         +----------+--------+--------+--------+----------------------+-----------------+ +----------+--------+--------+--------+-------------------+  Subclavian PSV cm/s EDV cm/s Describe Arm Pressure (mmHG)  +----------+--------+--------+--------+-------------------+             59                                              +----------+--------+--------+--------+-------------------+ +---------+--------+--+--------+-+  Vertebral PSV cm/s 36 EDV cm/s 7  +---------+--------+--+--------+-+  Summary: Right Carotid: There is no evidence of stenosis in the right ICA. Left Carotid: There is no evidence of stenosis in the left ICA. Vertebrals:  Bilateral vertebral arteries demonstrate antegrade flow. Subclavians: Normal flow hemodynamics were seen in bilateral subclavian  arteries. *See table(s) above for measurements and observations.  Electronically signed by Servando Snare MD on 10/30/2018 at 12:11:15 PM.    Final    Ct Angio Abd/pel W/ And/or W/o  Result Date: 11/02/2018 CLINICAL DATA:  73 year old female with history of severe aortic stenosis. Preprocedural study prior to potential transcatheter aortic valve replacement (TAVR) procedure. EXAM: CT ANGIOGRAPHY CHEST, ABDOMEN AND PELVIS TECHNIQUE: Multidetector CT imaging through the chest, abdomen and pelvis was performed using the standard protocol during bolus administration of intravenous contrast. Multiplanar reconstructed images and  MIPs were obtained and reviewed to evaluate the vascular anatomy. CONTRAST:  76mL ISOVUE-370 IOPAMIDOL (ISOVUE-370) INJECTION 76% COMPARISON:  Chest CT 10/05/2018. FINDINGS: CTA CHEST FINDINGS Cardiovascular: Heart size is mildly enlarged. There is no significant pericardial fluid, thickening or pericardial calcification. There is aortic atherosclerosis, as well as atherosclerosis of the great vessels of the mediastinum and the coronary arteries, including calcified atherosclerotic plaque in the left anterior descending coronary artery. Severe thickening calcification of the aortic valve. Mediastinum/Lymph Nodes: No pathologically enlarged mediastinal or hilar lymph nodes. Esophagus is unremarkable in appearance. No axillary lymphadenopathy. Lungs/Pleura: Linear areas of scarring in the left lower lobe and right middle lobe. No acute consolidative airspace disease. No pleural effusions. No suspicious appearing pulmonary nodules or masses. Musculoskeletal/Soft Tissues: There are no aggressive appearing lytic or blastic lesions noted in the visualized portions of the skeleton. CTA ABDOMEN AND PELVIS FINDINGS Hepatobiliary: No suspicious cystic or solid hepatic lesions. No intra or extrahepatic biliary ductal dilatation. Gallbladder is normal in appearance. Pancreas: No pancreatic mass. No pancreatic ductal dilatation. No pancreatic or peripancreatic fluid or inflammatory changes. Spleen: Unremarkable. Adrenals/Urinary Tract: Subcentimeter low-attenuation lesion in the anterior aspect of the interpolar region of the left kidney, too small to characterize, but statistically likely a tiny cyst. Right kidney and bilateral adrenal glands are normal in appearance. No hydroureteronephrosis. Urinary bladder is partially obscured by beam hardening artifact from the patient's bilateral hip arthroplasties, but visualized portions are generally unremarkable. Stomach/Bowel: Normal appearance of the stomach. No pathologic  dilatation of small bowel or colon. Normal appendix. Vascular/Lymphatic: Aortic atherosclerosis, with vascular findings and measurements pertinent to potential TAVR procedure, as detailed above. 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 No lymphadenopathy noted in the abdomen or pelvis. Reproductive: Status post hysterectomy. Ovaries are not confidently identified may be obscured by beam hardening artifact, or may be surgically absent or atrophic. Other: No significant volume of ascites.  No pneumoperitoneum. Musculoskeletal: Status post bilateral hip arthroplasty. There are no aggressive appearing lytic or blastic lesions noted in the visualized portions of the skeleton. VASCULAR MEASUREMENTS PERTINENT TO TAVR: AORTA: Minimal Aortic Diameter-20 x 17 mm Severity of Aortic Calcification-mild RIGHT PELVIS: Right Common Iliac Artery - Minimal Diameter-10.9 x 9.9 mm Tortuosity-moderate Calcification-mild Right External Iliac Artery - Minimal Diameter-10.1 x 8.6 mm Tortuosity-mild-to-moderate Calcification-none Right Common Femoral Artery - Minimal Diameter-8.1 x 8.5 mm Tortuosity-mild Calcification-none LEFT PELVIS: Left Common Iliac Artery - Minimal Diameter-13.7 x 12.1 mm Tortuosity-mild Calcification-mild Left External Iliac Artery - Minimal Diameter-9.0 x 8.7 mm Tortuosity-severe Calcification-none Left Common Femoral Artery - Minimal Diameter-8.6 x 8.0 mm Tortuosity-mild Calcification-none Review of the MIP images confirms the above findings. IMPRESSION: 1. Vascular findings and measurements pertinent to potential TAVR procedure, as detailed above. 2. Thickening calcification of the aortic valve, compatible with the reported clinical history of severe aortic stenosis. 3. Aortic atherosclerosis, in addition to left anterior descending coronary artery disease. 4. Mild  cardiomegaly. 5. Additional incidental findings, as above. Electronically Signed   By: Vinnie Langton M.D.   On: 11/02/2018 12:24   Disposition   Pt is being discharged home today in good condition.  Follow-up Plans & Appointments    Follow-up Information    Eileen Stanford, PA-C. Go on 11/19/2018.   Specialties:  Cardiology, Radiology Why:  @ 1pm, please arrive at least 10 minutes early Contact information: [place] [id number] [phone]        [place] [facility] Follow up.   Specialty:  Cardiology Why:  11/16/2018 @ 1:00PM, pacemaker wound check visit Contact information: [address], [address] [place]       Evans Lance, MD Follow up.   Specialty:  Cardiology Why:  02/16/2019 @ 11:00AM Contact information: [address] [address] [phone]          Discharge Instructions    Amb Referral to Cardiac Rehabilitation   Complete by:  As directed    Diagnosis:  Valve Replacement   Valve:  Aortic Comment - TAVR      Discharge Medications   Allergies as of 11/12/2018      Reactions   Codeine Other (See Comments)   Hallucinations      Medication List    TAKE these medications   acetaminophen 500 MG tablet Commonly known as:  TYLENOL Take 500-1,000 mg by mouth every 6 (six) hours as needed for moderate pain.   aspirin EC 81 MG tablet Take 81 mg by mouth daily.   clopidogrel 75 MG tablet Commonly known as:  Plavix Take 1 tablet (75 mg total) by mouth daily. Notes to patient:  Please hold for 3 days given pacemaker bleeding. Okay to resume 3/16   diclofenac sodium 1 % Gel Commonly known as:  VOLTAREN Apply 1 application topically 4 (four) times daily as needed (pain).   Entresto 24-26 MG Generic drug:  sacubitril-valsartan Take 0.5 tablets by mouth 2 (two) times daily.   estradiol 0.025 mg/24hr patch Commonly known as:  CLIMARA - Dosed in mg/24 hr PLACE 1 PATCH (0.025 MG TOTAL) ONTO THE SKIN ONCE A WEEK.     etodolac 500 MG tablet Commonly known as:  LODINE Take 1 tablet (500 mg total) by mouth daily.   furosemide 20 MG tablet Commonly known as:  LASIX Take 1 tablet (20 mg total) by mouth daily.   MUSCLE RUB EX Apply 1 application topically daily as needed (muscle pain).   Pepcid AC 10 MG tablet Generic drug:  famotidine Take 10 mg by mouth daily as needed for heartburn or indigestion.   potassium chloride SA 20 MEQ tablet Commonly known as:  K-DUR,KLOR-CON Take 0.5 tablets (10 mEq total) by mouth daily. What changed:  how much to take   UNABLE TO FIND Hyland's Leg Cramps tablets: Take 1 tablet by mouth during the night as needed for leg cramps   zolpidem 10 MG tablet Commonly known as:  AMBIEN Take 1 tablet (10 mg total) by mouth at bedtime as needed. For sleep         Outstanding Labs/Studies   None   Duration of Discharge Encounter   Greater than 30 minutes including physician time.  SignedAngelena Form, PA-C 11/12/2018, 12:20 PM [phone]

## 2018-11-13 ENCOUNTER — Telehealth: Payer: Self-pay | Admitting: Physician Assistant

## 2018-11-13 ENCOUNTER — Encounter (HOSPITAL_COMMUNITY): Payer: Self-pay | Admitting: Internal Medicine

## 2018-11-13 NOTE — Telephone Encounter (Signed)
  [facility]   MULTIDISCIPLINARY HEART VALVE TEAM   Attempted a TOC call. Left a VM. Will try again Monday.   Angelena Form PA-C  [facility]

## 2018-11-13 NOTE — Telephone Encounter (Addendum)
  [place] VALVE TEAM   Patient contacted regarding discharge from [facility] on 11/12/18.  Patient understands to follow up with provider Nell Range on 3/19 at [place].  Patient understands discharge instructions? yes Patient understands medications and regiment? yes Patient understands to bring all medications to this visit? Yes  Having some lightheadedness when she gets up and some jitters. She has no way to check her BP at home but will go to [facility] to check.  I have asked her to stop taking her lasix and based on the BP reading she gets I may also stop her Entresto. Patient also had a pocket hematoma s/p PPM. She cannot assess the site because it is so tightly wrapped. She is holding her plavix until Monday. She has a wound care check at our office Monday as well. I will route to our device clinic.   Angelena Form PA-C  [facility]

## 2018-11-16 ENCOUNTER — Ambulatory Visit (INDEPENDENT_AMBULATORY_CARE_PROVIDER_SITE_OTHER): Payer: PPO | Admitting: *Deleted

## 2018-11-16 ENCOUNTER — Other Ambulatory Visit: Payer: Self-pay

## 2018-11-16 ENCOUNTER — Ambulatory Visit: Payer: PPO | Admitting: Cardiology

## 2018-11-16 ENCOUNTER — Encounter: Payer: Self-pay | Admitting: Internal Medicine

## 2018-11-16 DIAGNOSIS — Z952 Presence of prosthetic heart valve: Secondary | ICD-10-CM

## 2018-11-16 DIAGNOSIS — Z950 Presence of cardiac pacemaker: Secondary | ICD-10-CM | POA: Diagnosis not present

## 2018-11-16 DIAGNOSIS — I442 Atrioventricular block, complete: Secondary | ICD-10-CM | POA: Diagnosis not present

## 2018-11-16 NOTE — Patient Instructions (Signed)
Continue holding your Plavix.  Come back on Wednesday, 11/18/18 at 9:00am to see Dr. Lovena Le.  Call the [facility] at [phone] if your swelling increases or becomes more firm before Wednesday.

## 2018-11-17 LAB — CUP PACEART INCLINIC DEVICE CHECK
Brady Statistic RA Percent Paced: 1 % — CL
Brady Statistic RV Percent Paced: 98 %
Date Time Interrogation Session: 20200316040000
Implantable Lead Implant Date: 20200311
Implantable Lead Implant Date: 20200311
Implantable Lead Implant Date: 20200311
Implantable Lead Location: 753858
Implantable Lead Location: 753859
Implantable Lead Location: 753860
Implantable Lead Model: 4674
Implantable Lead Model: 7741
Implantable Lead Model: 7742
Implantable Lead Serial Number: 1022279
Implantable Lead Serial Number: 830411
Implantable Pulse Generator Implant Date: 20200311
Lead Channel Impedance Value: 722 Ohm
Lead Channel Impedance Value: 762 Ohm
Lead Channel Impedance Value: 963 Ohm
Lead Channel Pacing Threshold Amplitude: 0.5 V
Lead Channel Pacing Threshold Amplitude: 0.6 V
Lead Channel Pacing Threshold Amplitude: 1.5 V
Lead Channel Pacing Threshold Pulse Width: 0.4 ms
Lead Channel Pacing Threshold Pulse Width: 0.4 ms
Lead Channel Pacing Threshold Pulse Width: 0.4 ms
Lead Channel Sensing Intrinsic Amplitude: 23.5 mV
Lead Channel Sensing Intrinsic Amplitude: 3.8 mV
Lead Channel Sensing Intrinsic Amplitude: 4.5 mV
Lead Channel Setting Pacing Amplitude: 3.5 V
Lead Channel Setting Pacing Amplitude: 3.5 V
Lead Channel Setting Pacing Pulse Width: 0.4 ms
Lead Channel Setting Pacing Pulse Width: 0.4 ms
Lead Channel Setting Sensing Sensitivity: 2.5 mV
MDC IDC LEAD SERIAL: 1098426
MDC IDC SET LEADCHNL RV PACING AMPLITUDE: 3.5 V
MDC IDC SET LEADCHNL RV SENSING SENSITIVITY: 2.5 mV
Pulse Gen Serial Number: 747113

## 2018-11-17 NOTE — Progress Notes (Signed)
Device Clinic appointment for hematoma assessment. Moderate-sized hematoma present, firm to palpation. Patient removed pressure dressing on 11/14/18 due to discomfort, hematoma unchanged in size or firmness since then per patient. Plavix remains on hold. Steri-strips left in place until wound check. No redness or drainage noted, ecchymosis present extending along left axilla. SK saw patient, advised continuing to hold Plavix, continue taking ASA 81mg  (s/p TAVR on 11/10/18), f/u on 11/18/18 with GT for hematoma reassessment. Patient agrees to call if hematoma worsens prior to this appointment.  Normal device function. RA and LV thresholds, sensing, and impedances consistent with implant measurements. R-wave amplitude decreased since implant (16.51mV on 3/11, 5.65mV on 3/12, 4.59mV today) and impedance now 722ohms (down from 1100ohms at implant), RV threshold stable. Device programmed at 3.5V for extra safety margin until 3 month visit. Histogram distribution appropriate for patient and level of activity. 6 mode switches (<1%)--AT per available EGMs, total time 1.23min. No high ventricular rates noted. Patient educated about wound care, arm mobility, lifting restrictions, and Latitude monitor. ROV with GT on 11/18/18.

## 2018-11-18 ENCOUNTER — Ambulatory Visit (INDEPENDENT_AMBULATORY_CARE_PROVIDER_SITE_OTHER): Payer: PPO | Admitting: Internal Medicine

## 2018-11-18 ENCOUNTER — Encounter: Payer: Self-pay | Admitting: Internal Medicine

## 2018-11-18 ENCOUNTER — Other Ambulatory Visit: Payer: Self-pay

## 2018-11-18 VITALS — BP 124/76 | HR 55 | Ht 67.5 in | Wt 149.0 lb

## 2018-11-18 DIAGNOSIS — I442 Atrioventricular block, complete: Secondary | ICD-10-CM

## 2018-11-18 DIAGNOSIS — Z950 Presence of cardiac pacemaker: Secondary | ICD-10-CM

## 2018-11-18 NOTE — Progress Notes (Signed)
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 She was on Plavix and had a hematoma after the PPM. She has not had any bleeding. She denies chest pain or sob. She has some itching under her incision. No fever or chills.  Allergies  Allergen Reactions  . Codeine Other (See Comments)    Hallucinations     Current Outpatient Medications  Medication Sig Dispense Refill  . acetaminophen (TYLENOL) 500 MG tablet Take 500-1,000 mg by mouth every 6 (six) hours as needed for moderate pain.    [name] aspirin EC 81 MG tablet Take 81 mg by mouth daily.    . diclofenac sodium (VOLTAREN) 1 % GEL Apply 1 application topically 4 (four) times daily as needed (pain).    [name] estradiol (CLIMARA - DOSED IN MG/24 HR) 0.025 mg/24hr patch PLACE 1 PATCH (0.025 MG TOTAL) ONTO THE SKIN ONCE A WEEK. 4 patch 12  . etodolac (LODINE) 500 MG tablet Take 1 tablet (500 mg total) by mouth daily. 30 tablet 6  . famotidine (PEPCID AC) 10 MG tablet Take 10 mg by mouth daily as needed for heartburn or indigestion.    . furosemide (LASIX) 20 MG tablet Take 1 tablet (20 mg total) by mouth daily. 90 tablet 3  . Menthol-Methyl Salicylate (MUSCLE RUB EX) Apply 1 application topically daily as needed (muscle pain).    . potassium chloride SA (K-DUR,KLOR-CON) 20 MEQ tablet Take 0.5 tablets (10 mEq total) by mouth daily. 90 tablet 3  . sacubitril-valsartan (ENTRESTO) 24-26 MG Take 0.5 tablets by mouth 2 (two) times daily.  60 tablet   . UNABLE TO FIND Hyland's Leg Cramps tablets: Take 1 tablet by mouth during the night as needed for leg cramps    . zolpidem (AMBIEN) 10 MG tablet Take 1 tablet (10 mg total) by mouth at bedtime as needed. For sleep 30 tablet 1  . clopidogrel (PLAVIX) 75 MG tablet Take 1 tablet (75 mg total) by mouth daily. (Patient not taking: Reported on 11/16/2018) 90 tablet 1   No current facility-administered medications for this visit.      Past  Medical History:  Diagnosis Date  . Abnormality of gait 10/20/2014  . Arthritis    "was in my hips; still in my knees" (01/13/2018)  . Bicuspid aortic valve   . Chronic combined systolic and diastolic heart failure ([place])   . DJD (degenerative joint disease), ankle and foot 10/20/2014  . HAMMER TOE, ACQUIRED 07/28/2007   Qualifier: Diagnosis of  By: Lorelei Pont MD, Frederico Hamman    . HLD (hyperlipidemia) 01/14/2018  . Metatarsalgia of left foot 10/20/2014  . Mild coronary artery disease 01/14/2018   LAD with 50% stenosis on [facility] 12/2017  . Mitral regurgitation   . OA (osteoarthritis) of hip 03/24/2013  . PLANTAR FASCIITIS 07/28/2007   Qualifier: Diagnosis of  By: Lorelei Pont MD, Frederico Hamman    . RBBB   . S/P TAVR (transcatheter aortic valve replacement) 11/10/2018   26 mm Edwards Sapien 3 transcatheter heart valve placed via percutaneous right transfemoral approach   . Severe aortic stenosis   . Varicose veins of bilateral lower extremities with other complications 12/02/3242    ROS:   All systems reviewed and negative except as noted in the HPI.   Past Surgical History:  Procedure Laterality Date  . ABDOMINAL HYSTERECTOMY  03/03/2012   Procedure: HYSTERECTOMY ABDOMINAL;  Surgeon: Alvino Chapel, MD;  Location: [facility];  Service:  Gynecology;  Laterality: N/A;  . BIV PACEMAKER INSERTION CRT-P N/A 11/11/2018   Procedure: BIV PACEMAKER INSERTION CRT-P;  Surgeon: Evans Lance, MD;  Location: [place] [facility];  Service: Cardiovascular;  Laterality: N/A;  . BLEPHAROPLASTY Bilateral   . JOINT REPLACEMENT    . 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 RIGHT/LEFT HEART CATH AND CORONARY ANGIOGRAPHY N/A 01/14/2018   Procedure: RIGHT/LEFT HEART CATH AND CORONARY ANGIOGRAPHY;  Surgeon: Wellington Hampshire, MD;  Location: [place] [facility];  Service:  Cardiovascular;  Laterality: N/A;  . RIGHT/LEFT HEART CATH AND CORONARY ANGIOGRAPHY N/A 10/23/2018   Procedure: RIGHT/LEFT HEART CATH AND CORONARY ANGIOGRAPHY;  Surgeon: [name], [name], MD;  Location: [place] [facility];  Service: Cardiovascular;  Laterality: N/A;  . SALPINGOOPHORECTOMY  03/03/2012   Procedure: SALPINGO OOPHERECTOMY;  Surgeon: Alvino Chapel, MD;  Location: [facility];  Service: Gynecology;  Laterality: Bilateral;  . TEE WITHOUT CARDIOVERSION N/A 11/10/2018   Procedure: TRANSESOPHAGEAL ECHOCARDIOGRAM (TEE);  Surgeon: Sherren Mocha, MD;  Location: [place] [facility];  Service: Open Heart Surgery;  Laterality: N/A;  . TONSILLECTOMY    . TOTAL HIP ARTHROPLASTY Left 03/24/2013   Procedure: LEFT TOTAL HIP ARTHROPLASTY ANTERIOR APPROACH;  Surgeon: [name], MD;  Location: [facility];  Service: Orthopedics;  Laterality: Left;  . TOTAL HIP ARTHROPLASTY Right 12/18/2016   Procedure: RIGHT TOTAL HIP ARTHROPLASTY ANTERIOR APPROACH;  Surgeon: Gaynelle Arabian, MD;  Location: [facility];  Service: Orthopedics;  Laterality: Right;  . TRANSCATHETER AORTIC VALVE REPLACEMENT, TRANSFEMORAL N/A 11/10/2018   Procedure: TRANSCATHETER AORTIC VALVE REPLACEMENT, TRANSFEMORAL;  Surgeon: Sherren Mocha, MD;  Location: [place] [facility];  Service: Open Heart Surgery;  Laterality: N/A;  . TUBAL LIGATION       Family History  Problem Relation Age of Onset  . Hypertension Mother   . Bone cancer Father   . Lung cancer Father   . Ovarian cancer Sister   . Diabetes Maternal Aunt   . Breast cancer Maternal Aunt        Age 59's     Social History   Socioeconomic History  . Marital status: Widowed    Spouse name: Not on file  . Number of children: Not on file  . Years of education: Not on file  . Highest education level: Not on file  Occupational History  . Not on file  Social Needs  . Financial resource strain: Not on file  . Food insecurity:    Worry: Not on file    Inability: Not on file   . Transportation needs:    Medical: Not on file    Non-medical: Not on file  Tobacco Use  . Smoking status: Never Smoker  . Smokeless tobacco: Never Used  Substance and Sexual Activity  . Alcohol use: Yes    Alcohol/week: 7.0 standard drinks    Types: 7 Standard drinks or equivalent per week    Comment: 1 / PER DAY  . Drug use: No  . Sexual activity: Not Currently    Birth control/protection: Surgical    Comment: 1st intercourse 73 yo-Fewer than 5 partners  Lifestyle  . Physical activity:    Days per week: Not on file    Minutes per session: Not on file  . Stress: Not on file  Relationships  . Social connections:    Talks on  phone: Not on file    Gets together: Not on file    Attends religious service: Not on file    Active member of club or organization: Not on file    Attends meetings of clubs or organizations: Not on file    Relationship status: Not on file  . Intimate partner violence:    Fear of current or ex partner: Not on file    Emotionally abused: Not on file    Physically abused: Not on file    Forced sexual activity: Not on file  Other Topics Concern  . Not on file  Social History Narrative   Lives locally.  Retired. Active - walks frequently and plays tennis regularly.     BP 124/76   Pulse (!) 55   Ht 5' 7.5" (1.715 m)   Wt 149 lb (67.6 kg)   SpO2 99%   BMI 22.99 kg/m   Physical Exam:  Well appearing 73 yo woman, NAD HEENT: Unremarkable Neck:  6 cm JVD, no thyromegally Lymphatics:  No adenopathy Back:  No CVA tenderness Lungs:  Clear with no wheezes HEART:  Regular rate rhythm, no murmurs, no rubs, no clicks Abd:  soft, positive bowel sounds, no organomegally, no rebound, no guarding Ext:  2 plus pulses, no edema, no cyanosis, no clubbing Skin:  No rashes no nodules Neuro:  CN II through XII intact, motor grossly intact   DEVICE  Not checked.   Assess/Plan: 1. PM pocket hematoma - she appears stable. She will continue ASA but hold  plavix another week. She will return for 2D echo on 4/8 and see Nell Range. She will call us if she starts bleeding and I instructed her to go to the hospital.   Mikle Bosworth.D.

## 2018-11-18 NOTE — Patient Instructions (Addendum)
Medication Instructions:  Your physician recommends that you continue on your current medications as directed. Please refer to the Current Medication list given to you today.  You may restart your Plavix a week from today--November 25, 2018  You can resume showering today  Labwork: None ordered.  Testing/Procedures: None ordered.  Follow-Up: Your physician wants you to follow-up in: as scheduled.       Any Other Special Instructions Will Be Listed Below (If Applicable).  If you need a refill on your cardiac medications before your next appointment, please call your pharmacy.

## 2018-11-19 ENCOUNTER — Other Ambulatory Visit: Payer: Self-pay | Admitting: Physician Assistant

## 2018-11-19 ENCOUNTER — Ambulatory Visit: Payer: PPO | Admitting: Physician Assistant

## 2018-11-23 ENCOUNTER — Ambulatory Visit: Payer: PPO

## 2018-11-25 ENCOUNTER — Ambulatory Visit: Payer: PPO | Admitting: Pulmonary Disease

## 2018-12-04 ENCOUNTER — Other Ambulatory Visit: Payer: Self-pay

## 2018-12-04 DIAGNOSIS — Z952 Presence of prosthetic heart valve: Secondary | ICD-10-CM

## 2018-12-07 ENCOUNTER — Telehealth: Payer: Self-pay | Admitting: Physician Assistant

## 2018-12-07 ENCOUNTER — Encounter: Payer: Self-pay | Admitting: Thoracic Surgery (Cardiothoracic Vascular Surgery)

## 2018-12-07 NOTE — Telephone Encounter (Signed)
Is patient using Smartphone/computer/tablet apple tablet   Did audio/video work? Both work   Does patient need telephone visit?no  Best phone number to use? [phone]  Special Instructions? Please call patient let her know time for Wednesday so she can plan her day accordingly

## 2018-12-09 ENCOUNTER — Other Ambulatory Visit: Payer: Self-pay | Admitting: Physician Assistant

## 2018-12-09 ENCOUNTER — Telehealth (INDEPENDENT_AMBULATORY_CARE_PROVIDER_SITE_OTHER): Payer: PPO | Admitting: Physician Assistant

## 2018-12-09 ENCOUNTER — Ambulatory Visit (HOSPITAL_COMMUNITY): Payer: PPO

## 2018-12-09 ENCOUNTER — Other Ambulatory Visit: Payer: Self-pay

## 2018-12-09 DIAGNOSIS — Z952 Presence of prosthetic heart valve: Secondary | ICD-10-CM | POA: Diagnosis not present

## 2018-12-09 DIAGNOSIS — I5042 Chronic combined systolic (congestive) and diastolic (congestive) heart failure: Secondary | ICD-10-CM

## 2018-12-09 DIAGNOSIS — I350 Nonrheumatic aortic (valve) stenosis: Secondary | ICD-10-CM

## 2018-12-09 DIAGNOSIS — I5022 Chronic systolic (congestive) heart failure: Secondary | ICD-10-CM

## 2018-12-09 DIAGNOSIS — T82837D Hemorrhage of cardiac prosthetic devices, implants and grafts, subsequent encounter: Secondary | ICD-10-CM

## 2018-12-09 DIAGNOSIS — Z950 Presence of cardiac pacemaker: Secondary | ICD-10-CM

## 2018-12-09 MED ORDER — CLOPIDOGREL BISULFATE 75 MG PO TABS: 75.0000 mg | ORAL_TABLET | Freq: Every day | ORAL | 1 refills | Status: DC

## 2018-12-09 MED ORDER — AMOXICILLIN 500 MG PO TABS: 2000.0000 mg | ORAL_TABLET | ORAL | 12 refills | Status: AC

## 2018-12-09 NOTE — Patient Instructions (Signed)
Hello Ms Dreibelbis,   It was so nice to talk to you on the phone today. I am so glad you are doing so well. I just wanted to send you a recap of our discussion.   Please continue taking antibiotics prior to any dental work including cleanings (amoxicillin called into your pharmacy). You can discontinue plavix after 6 months of therapy (around 05/13/2019) or when your pills run out (you were given a 90 day supply with one refill). Please continue on aspirin 81 mg indefinitely.   Your 1 month echo has been rescheduled to August.  You will be seen back by your primary cardiologist, Dr. [name], in about 4 months.   I will see you back in 1 years time with an echo.   All appointment details are attached to this letter in your "after visit summary."  Please call us with any questions or concerns you may have and please stay safe during these uncertain times.  Nell Range

## 2018-12-09 NOTE — Progress Notes (Signed)
[facility]   MULTIDISCIPLINARY HEART VALVE TEAM   Evaluation Performed:  Follow-up visit  This visit type was conducted due to national recommendations for restrictions regarding the COVID-19 Pandemic (e.g. social distancing).  This format is felt to be most appropriate for this patient at this time.  All issues noted in this document were discussed and addressed.  No physical exam was performed (except for noted visual exam findings with Telehealth visits).  The patient has consented to conduct a Telehealth visit and understands insurance will be billed.   Date:  12/09/2018   ID:  Brittany Shaffer, DOB [birth year], MRN [id number]  Patient Location:  [address] [place] [address]   Provider location:   [address] [place], [place] [address]  PCP:  [name], MD  Cardiologist:  Dr.Jordan/ Dr. [name] & Dr. Roxy Manns (TAVR) Electrophysiologist:  Dr. Lovena Le   Chief Complaint:  1 month s/p TAVR  History of Present Illness:    Brittany Shaffer is a 73 y.o. female with a history of bicuspid Aov, mitral regurgitation, NICM, chronic combined S/D CHF, RBBB, HLD, degenerative arthritis and severe AS s/p TAVR (11/10/2018) who presents via audio/video conferencing for a telehealth visit today.    The patient does not symptoms concerning for COVID-19 infection (fever, chills, cough, or new SHORTNESS OF BREATH).   She underwent successful TAVR with a14mm Edwards Sapien 3 THV via the TF approach on 11/10/18. Post operative echo showed EF 40-45%, normally functioning TAVR with mean gradient of 15.79mm Hg and mild PVL. Hospital course was complicated by intraprocedural CHB. She underwent successful CRT-P by Dr. Lovena Le on 11/11/18. She did develop a pocket hematoma that required compression. Plavix was held for several weeks. She is now back on DAPT with aspirin and plavix.   Today she presents via audio/video chat. She is doing quite well. Walks about 10-15K steps a day. Has no issues  with this. Sometimes has some intermittent fatigue and wonders when this will go away. No CP or SOB. No LE edema, orthopnea or PND. No dizziness or syncope. No blood in stool or urine. No palpitations.   Prior CV studies:   The following studies were reviewed today:  TAVR OPERATIVE NOTE   Date of Procedure:11/10/2018  Preoperative Diagnosis:Severe Aortic Stenosis   Postoperative Diagnosis:Same   Procedure:   Transcatheter Aortic Valve Replacement - PercutaneousRightTransfemoral Approach Edwards Sapien 3 THV (size 71mm, model # 9600TFX, serial # [id number])  Co-Surgeons:Clarence H. Roxy Manns, MD and Sherren Mocha, MD  Anesthesiologist:Ryan Roanna Banning, MD  Dala Dock, MD  Pre-operative Echo Findings: ? Severe aortic stenosis ? Moderateleft ventricular systolicdysfunction  Post-operative Echo Findings: ? Mildparavalvular leak ? Unchangedleft ventricular systolic function  _______________   Echo 11/11/18: IMPRESSIONS 1. A 26 an Edwards Edwards Sapien bioprosthetic aortic valve (TAVR) valve is present in the aortic position. Procedure Date: 11/10/2018 Normal aortic valve prosthesis. Echo findings are consistent with mild perivalvular regurgitation at former RCC of the aortic prosthesis. 2. Aortic valve regurgitation is mild by color flow Doppler. 3. The left ventricle has mild-moderately reduced systolic function, with an ejection fraction of 40-45%. The cavity size was normal. There is moderate concentric left ventricular hypertrophy. Left ventricular diastolic Doppler parameters are consistent with pseudonormal. Left ventricular diffuse hypokinesis. 4. The right ventricle has normal systolc function. The cavity was normal. There is no increase in right ventricular wall thickness. 5. The mitral valve is degenerative. Mild thickening of the  mitral valve leaflet. Mild calcification of the mitral  valve leaflet. 6. Left atrial size was moderately dilated. 7. Right atrial size was mildly dilated.  _______________  11/12/18 Procedures BIV PACEMAKER INSERTION CRT-P  Conclusion CONCLUSIONS:  1. Successful implantation of a [place] Sci BiV pacemaker for symptomatic Complete AV block  2. No early apparent complications.   Cristopher Peru, MD  6:29 PM 11/11/2018     Past Medical History:  Diagnosis Date  . Abnormality of gait 10/20/2014  . Arthritis    "was in my hips; still in my knees" (01/13/2018)  . Bicuspid aortic valve   . Chronic combined systolic and diastolic heart failure ([place])   . DJD (degenerative joint disease), ankle and foot 10/20/2014  . HAMMER TOE, ACQUIRED 07/28/2007   Qualifier: Diagnosis of  By: Lorelei Pont MD, Frederico Hamman    . HLD (hyperlipidemia) 01/14/2018  . Metatarsalgia of left foot 10/20/2014  . Mild coronary artery disease 01/14/2018   LAD with 50% stenosis on [facility] 12/2017  . Mitral regurgitation   . OA (osteoarthritis) of hip 03/24/2013  . PLANTAR FASCIITIS 07/28/2007   Qualifier: Diagnosis of  By: Lorelei Pont MD, Frederico Hamman    . RBBB   . S/P TAVR (transcatheter aortic valve replacement) 11/10/2018   26 mm Edwards Sapien 3 transcatheter heart valve placed via percutaneous right transfemoral approach   . Severe aortic stenosis   . Varicose veins of bilateral lower extremities with other complications 5/0/3546   Past Surgical History:  Procedure Laterality Date  . ABDOMINAL HYSTERECTOMY  03/03/2012   Procedure: HYSTERECTOMY ABDOMINAL;  Surgeon: Alvino Chapel, MD;  Location: [facility];  Service: Gynecology;  Laterality: N/A;  . BIV PACEMAKER INSERTION CRT-P N/A 11/11/2018   Procedure: BIV PACEMAKER INSERTION CRT-P;  Surgeon: Evans Lance, MD;  Location: [place] [facility];  Service: Cardiovascular;  Laterality: N/A;  . BLEPHAROPLASTY Bilateral   . JOINT REPLACEMENT    . LAPAROTOMY  03/03/2012    Procedure: EXPLORATORY LAPAROTOMY;  Surgeon: Alvino Chapel, MD;  Location: [facility];  Service: Gynecology;  Laterality: N/A; 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  . RIGHT/LEFT HEART CATH AND CORONARY ANGIOGRAPHY N/A 01/14/2018   Procedure: RIGHT/LEFT HEART CATH AND CORONARY ANGIOGRAPHY;  Surgeon: Wellington Hampshire, MD;  Location: [place] [facility];  Service: Cardiovascular;  Laterality: N/A;  . RIGHT/LEFT HEART CATH AND CORONARY ANGIOGRAPHY N/A 10/23/2018   Procedure: RIGHT/LEFT HEART CATH AND CORONARY ANGIOGRAPHY;  Surgeon: [name], [name], MD;  Location: [place] [facility];  Service: Cardiovascular;  Laterality: N/A;  . SALPINGOOPHORECTOMY  03/03/2012   Procedure: SALPINGO OOPHERECTOMY;  Surgeon: Alvino Chapel, MD;  Location: [facility];  Service: Gynecology;  Laterality: Bilateral;  . TEE WITHOUT CARDIOVERSION N/A 11/10/2018   Procedure: TRANSESOPHAGEAL ECHOCARDIOGRAM (TEE);  Surgeon: Sherren Mocha, MD;  Location: [place] [facility];  Service: Open Heart Surgery;  Laterality: N/A;  . TONSILLECTOMY    . TOTAL HIP ARTHROPLASTY Left 03/24/2013   Procedure: LEFT TOTAL HIP ARTHROPLASTY ANTERIOR APPROACH;  Surgeon: [name], MD;  Location: [facility];  Service: Orthopedics;  Laterality: Left;  . TOTAL HIP ARTHROPLASTY Right 12/18/2016   Procedure: RIGHT TOTAL HIP ARTHROPLASTY ANTERIOR APPROACH;  Surgeon: Gaynelle Arabian, MD;  Location: [facility];  Service: Orthopedics;  Laterality: Right;  . TRANSCATHETER AORTIC VALVE REPLACEMENT, TRANSFEMORAL N/A 11/10/2018   Procedure: TRANSCATHETER AORTIC VALVE REPLACEMENT, TRANSFEMORAL;  Surgeon: Sherren Mocha, MD;  Location: [place] [facility];  Service: Open Heart Surgery;  Laterality: N/A;  . TUBAL LIGATION       Current Meds  Medication  Sig  . acetaminophen (TYLENOL) 500 MG tablet Take 500-1,000 mg by mouth every 6 (six) hours as needed for moderate pain.  [name] aspirin EC 81 MG tablet Take 81 mg by mouth daily.  . clopidogrel (PLAVIX) 75 MG tablet Take 1  tablet (75 mg total) by mouth daily.  . diclofenac sodium (VOLTAREN) 1 % GEL Apply 1 application topically 4 (four) times daily as needed (pain).  [name] estradiol (CLIMARA - DOSED IN MG/24 HR) 0.025 mg/24hr patch PLACE 1 PATCH (0.025 MG TOTAL) ONTO THE SKIN ONCE A WEEK.  [name] etodolac (LODINE) 500 MG tablet Take 1 tablet (500 mg total) by mouth daily.  . famotidine (PEPCID AC) 10 MG tablet Take 10 mg by mouth daily as needed for heartburn or indigestion.  . furosemide (LASIX) 20 MG tablet Take 1 tablet (20 mg total) by mouth daily.  . Menthol-Methyl Salicylate (MUSCLE RUB EX) Apply 1 application topically daily as needed (muscle pain).  . potassium chloride SA (K-DUR,KLOR-CON) 20 MEQ tablet Take 0.5 tablets (10 mEq total) by mouth daily.  . sacubitril-valsartan (ENTRESTO) 24-26 MG Take 0.5 tablets by mouth 2 (two) times daily.   [name] UNABLE TO FIND Hyland's Leg Cramps tablets: Take 1 tablet by mouth during the night as needed for leg cramps  . zolpidem (AMBIEN) 10 MG tablet Take 1 tablet (10 mg total) by mouth at bedtime as needed. For sleep  . [DISCONTINUED] clopidogrel (PLAVIX) 75 MG tablet Take 1 tablet (75 mg total) by mouth daily.     Allergies:   Codeine   Social History   Tobacco Use  . Smoking status: Never Smoker  . Smokeless tobacco: Never Used  Substance Use Topics  . Alcohol use: Yes    Alcohol/week: 7.0 standard drinks    Types: 7 Standard drinks or equivalent per week    Comment: 1 / PER DAY  . Drug use: No     Family Hx: The patient's family history includes Bone cancer in her father; Breast cancer in her maternal aunt; Diabetes in her maternal aunt; Hypertension in her mother; Lung cancer in her father; Ovarian cancer in her sister.  ROS:   Please see the history of present illness.    All other systems reviewed and are negative.   Labs/Other Tests and Data Reviewed:    Recent Labs: 10/05/2018: TSH 2.810 11/09/2018: ALT 15; B Natriuretic Peptide 570.3 11/11/2018: Magnesium  1.9 11/12/2018: BUN 15; Creatinine, Ser 0.96; Hemoglobin 10.3; Platelets 121; Potassium 3.9; Sodium 138   Recent Lipid Panel Lab Results  Component Value Date/Time   CHOL 193 01/14/2018 02:12 AM   TRIG 55 01/14/2018 02:12 AM   HDL 85 01/14/2018 02:12 AM   CHOLHDL 2.3 01/14/2018 02:12 AM   LDLCALC 97 01/14/2018 02:12 AM    Wt Readings from Last 3 Encounters:  11/18/18 149 lb (67.6 kg)  11/12/18 152 lb 12.5 oz (69.3 kg)  11/09/18 147 lb 9.6 oz (67 kg)     Exam:    There were no vitals filed for this visit.  She appears well over video. Alert and oriented. Pocket hematoma resolving with healing hematoma. No LE edema.  ASSESSMENT & PLAN:    Severe AS s/p TAVR: echo has been pushed out to August due to covid 19. She has NYHA class I symptoms and staying as active as possible walking her dog ect. SBE prophylaxis discussed; I have RX'd amoxicillin.  Plavix can be discontinued after 6 months of therapy (05/2019). She will continue on aspirin 81  mg daily indefinitely. I will see her back in 1year with echo and office visit.   BiV pacer in place: followed by Dr. Lovena Le.   Pocket hematoma: this looks much better. She is back on plavix now. Continue pacemaker restrictions. No tennis for 5 weeks after procedure per Dr. Lovena Le  Chronic combined S/F CHF: EF improved to ~45% on post TAVR echo. No s/s CHF. Continue lasix and entresto. She wonders if she will be able to come off Entresto at some point. I asked her to discuss this with Dr. [name] at office visit based on EF on follow up echo.    COVID-19 Education: The signs and symptoms of COVID-19 were discussed with the patient and how to seek care for testing (follow up with PCP or arrange E-visit).  The importance of social distancing was discussed today.  Patient Risk:   After full review of this patients clinical status, I feel that they are at least moderate risk at this time.  Time:   Today, I have spent 25 minutes with the patient  with telehealth technology discussing post surgical recovery, symptoms and instructions going forward.     Medication Adjustments/Labs and Tests Ordered: Current medicines are reviewed at length with the patient today.  Concerns regarding medicines are outlined above.  Tests Ordered: No orders of the defined types were placed in this encounter.  Medication Changes: Meds ordered this encounter  Medications  . clopidogrel (PLAVIX) 75 MG tablet    Sig: Take 1 tablet (75 mg total) by mouth daily.    Dispense:  90 tablet    Refill:  1    Order Specific Question:   Supervising Provider    Answer:   COOPER, MICHAEL [[id number]]  . amoxicillin (AMOXIL) 500 MG tablet    Sig: Take 4 tablets (2,000 mg total) by mouth as directed. 1 hour prior to dental work including cleanings    Dispense:  12 tablet    Refill:  12    Order Specific Question:   Supervising Provider    Answer:   [name], [place]    Disposition:  4 months with Dr. [name]  Signed, Angelena Form, PA-C  12/09/2018 12:25 PM    [address] [place], [place], [place]  [address] Phone: [phone]; Fax: [phone]

## 2018-12-13 NOTE — Progress Notes (Signed)
That's great thx Joellen Jersey!

## 2018-12-15 ENCOUNTER — Telehealth (HOSPITAL_COMMUNITY): Payer: Self-pay | Admitting: *Deleted

## 2018-12-15 ENCOUNTER — Telehealth: Payer: Self-pay | Admitting: Physician Assistant

## 2018-12-15 NOTE — Telephone Encounter (Signed)
Called and spoke to pt regarding Cardiac rehab referral and departmental closure in adherence to [facility] recommendations for Covid-19. Pt verbalized understanding.  Pt is walking 3-5 miles every day depending on how she feels. Pt with no symptoms.  Pt advised she would be contacted when permitted to schedule. Cherre Huger, BSN Cardiac and Training and development officer

## 2018-12-15 NOTE — Telephone Encounter (Signed)
I spoke with patient and reviewed a photo sent to me. She has a diffuse, raised, erythematous rash with at least 20 little satellite lesions on her buttox and trunk. Looks like bug bites or drug eruption. Plan to stop plavix at this time. When the rash resolves we will plan to r- challenge plavix. If rash returns will switch her to Brilinta or Effient. I will check back in with her next week.

## 2018-12-15 NOTE — Telephone Encounter (Signed)
New Message    Pt c/o medication issue:  1. Name of Medication: Plavix  2. How are you currently taking this medication (dosage and times per day)? 75mg  1x daily  3. Are you having a reaction (difficulty breathing--STAT)? No   4. What is your medication issue? Pt says she has what look like circle welts, or hives, shes not sure if its from the medication or something she has eating. She said it itches. She said it is on her left side around her butt

## 2018-12-23 ENCOUNTER — Other Ambulatory Visit: Payer: Self-pay | Admitting: Physician Assistant

## 2018-12-23 MED ORDER — ASPIRIN EC 325 MG PO TBEC: 325.0000 mg | DELAYED_RELEASE_TABLET | Freq: Every day | ORAL | Status: DC

## 2018-12-23 NOTE — Telephone Encounter (Signed)
I called patient today to see how her rash was. She said it almost immediately started to clear once stopping the plavix. SHe does not want to go back on plavix. Plan to have her take Aspirin 325 mg daily 6 months and then drop down to a baby aspirin   Angelena Form PA-C  [facility]

## 2019-01-12 ENCOUNTER — Telehealth (HOSPITAL_COMMUNITY): Payer: Self-pay | Admitting: *Deleted

## 2019-01-12 NOTE — Telephone Encounter (Signed)
[phone] Follow-up call placed to patient regarding cardiac rehab and continued closure due to COVID-19 restrictions. Patient has resumed playing tennis an hour and a half, 2-3 days/week and is walking 3-5 miles on the days she doesn't play tennis. Patient recently purchased an outdoor bike that she plans to start riding.  Patient feels that she progressing well thus far. Pt states she experienced dizziness "a week or two a go" going from seated to standing position and that it passed quickly. Pt states it's happened a couple of times when bending over and going from seated to standing. Pt advised to contact physician's office regarding dizziness, and pt is agreeable to this.Pt is exercising regularly and doesn't feel like she will need to participate in cardiac rehab at this time. Will close referral.  Sol Passer, MS, ACSM CEP

## 2019-01-15 ENCOUNTER — Other Ambulatory Visit: Payer: Self-pay

## 2019-01-15 MED ORDER — ASPIRIN EC 325 MG PO TBEC: 325.0000 mg | DELAYED_RELEASE_TABLET | Freq: Every day | ORAL | 11 refills | Status: DC

## 2019-01-19 DIAGNOSIS — L918 Other hypertrophic disorders of the skin: Secondary | ICD-10-CM | POA: Diagnosis not present

## 2019-01-19 DIAGNOSIS — D1801 Hemangioma of skin and subcutaneous tissue: Secondary | ICD-10-CM | POA: Diagnosis not present

## 2019-01-19 DIAGNOSIS — L853 Xerosis cutis: Secondary | ICD-10-CM | POA: Diagnosis not present

## 2019-01-19 DIAGNOSIS — D485 Neoplasm of uncertain behavior of skin: Secondary | ICD-10-CM | POA: Diagnosis not present

## 2019-01-19 DIAGNOSIS — L821 Other seborrheic keratosis: Secondary | ICD-10-CM | POA: Diagnosis not present

## 2019-01-19 DIAGNOSIS — L430 Hypertrophic lichen planus: Secondary | ICD-10-CM | POA: Diagnosis not present

## 2019-01-19 DIAGNOSIS — L570 Actinic keratosis: Secondary | ICD-10-CM | POA: Diagnosis not present

## 2019-01-28 ENCOUNTER — Telehealth: Payer: Self-pay | Admitting: Student

## 2019-01-28 NOTE — Telephone Encounter (Signed)
Remote reviewed. Pt had several episodes of what appear to be Sinus tach up to 130, and one episode of apparent SVT (11 beats, 221 bpm).  She has occasional palpitations, but no clear correlation between these episodes.    FYI for upcoming appointment.    Legrand Como [address]" Thomas, PA-C 01/28/2019 12:12 PM

## 2019-01-29 ENCOUNTER — Telehealth: Payer: Self-pay | Admitting: Adult Health

## 2019-01-29 ENCOUNTER — Telehealth: Payer: Self-pay | Admitting: Cardiology

## 2019-01-29 NOTE — Telephone Encounter (Signed)
New Message:   Please call, she .have some concerns. She have had some episodes where she had  a lot of jaw and teeth pain. This have happen several times and it lasts for abut 30 seconds. She says when this happen all her teeth hurt. She did not have this until after her TAVR  Procedure. If possible please call her before 10:30 or after 12:30 today please.

## 2019-01-29 NOTE — Telephone Encounter (Signed)
Mychart, smartphone, consent (verbal), pre reg complete 01/29/19 AF °

## 2019-01-29 NOTE — Telephone Encounter (Signed)
Returned call to patient she stated she has had 3 episodes of jaw and teeth pain.Each episode lasting appox 30 secs.Stated one episode woke up during night.No chest pain.No sob.Virtual appointment scheduled with Jory Sims DNP 02/01/19 at 8:20 am.Patient gave permission for a virtual appointment.

## 2019-02-01 ENCOUNTER — Telehealth: Payer: PPO | Admitting: Adult Health

## 2019-02-01 ENCOUNTER — Telehealth: Payer: Self-pay | Admitting: Internal Medicine

## 2019-02-01 NOTE — Telephone Encounter (Signed)
New message    [facility] for pt to return call. Need to RS appt on 6.16.20 with Dr. Lovena Le. Will offer appt with Chanetta Marshall. When pt returns call, please reach out via secure chat. I will speak to pt.

## 2019-02-15 ENCOUNTER — Encounter: Payer: Self-pay | Admitting: Nurse Practitioner

## 2019-02-15 ENCOUNTER — Other Ambulatory Visit: Payer: Self-pay

## 2019-02-15 ENCOUNTER — Ambulatory Visit (INDEPENDENT_AMBULATORY_CARE_PROVIDER_SITE_OTHER): Payer: PPO | Admitting: Student

## 2019-02-15 VITALS — BP 110/60 | HR 60 | Ht 67.5 in | Wt 154.0 lb

## 2019-02-15 DIAGNOSIS — Z952 Presence of prosthetic heart valve: Secondary | ICD-10-CM | POA: Diagnosis not present

## 2019-02-15 DIAGNOSIS — I442 Atrioventricular block, complete: Secondary | ICD-10-CM | POA: Diagnosis not present

## 2019-02-15 LAB — CUP PACEART INCLINIC DEVICE CHECK
Date Time Interrogation Session: 20200615040000
Implantable Lead Implant Date: 20200311
Implantable Lead Implant Date: 20200311
Implantable Lead Implant Date: 20200311
Implantable Lead Location: 753858
Implantable Lead Location: 753859
Implantable Lead Location: 753860
Implantable Lead Model: 4674
Implantable Lead Model: 7741
Implantable Lead Model: 7742
Implantable Lead Serial Number: 1022279
Implantable Lead Serial Number: 1098426
Implantable Lead Serial Number: 830411
Implantable Pulse Generator Implant Date: 20200311
Lead Channel Impedance Value: 1320 Ohm
Lead Channel Impedance Value: 723 Ohm
Lead Channel Impedance Value: 896 Ohm
Lead Channel Pacing Threshold Amplitude: 0.4 V
Lead Channel Pacing Threshold Amplitude: 0.7 V
Lead Channel Pacing Threshold Amplitude: 1.2 V
Lead Channel Pacing Threshold Pulse Width: 0.4 ms
Lead Channel Pacing Threshold Pulse Width: 0.4 ms
Lead Channel Pacing Threshold Pulse Width: 0.4 ms
Lead Channel Sensing Intrinsic Amplitude: 11.4 mV
Lead Channel Sensing Intrinsic Amplitude: 24.6 mV
Lead Channel Sensing Intrinsic Amplitude: 5.1 mV
Lead Channel Setting Pacing Amplitude: 2 V
Lead Channel Setting Pacing Amplitude: 2.5 V
Lead Channel Setting Pacing Amplitude: 2.5 V
Lead Channel Setting Pacing Pulse Width: 0.4 ms
Lead Channel Setting Pacing Pulse Width: 0.4 ms
Lead Channel Setting Sensing Sensitivity: 2.5 mV
Lead Channel Setting Sensing Sensitivity: 2.5 mV
Pulse Gen Serial Number: 747113

## 2019-02-15 NOTE — Progress Notes (Signed)
Electrophysiology Office Note Date: 02/15/2019  ID:  Brittany, Shaffer 1946/03/29, MRN [id number]  PCP: [name], MD Primary Cardiologist: No primary care provider on file. Electrophysiologist: None  CC: Pacemaker follow-up  Brittany Shaffer is a 73 y.o. female seen today for Dr. Lovena Le. They present today for routine electrophysiology followup.  Since her device implant she is doing very well. She continues to play tennis up to 1 hr 30 minutes on some days without difficulty. She occasionally has fatigue in the am, prior to taking her medication, that gets better throughout the day. She denies chest pain, palpitations, dyspnea, PND, orthopnea, nausea, vomiting, dizziness, syncope, edema, weight gain, or early satiety.  Device History: [place] Sci BiV PPM implanted 11/11/2018 for CHB s/p TAVR.   Past Medical History:  Diagnosis Date  . Abnormality of gait 10/20/2014  . Arthritis    "was in my hips; still in my knees" (01/13/2018)  . Bicuspid aortic valve   . Chronic combined systolic and diastolic heart failure ([place])   . DJD (degenerative joint disease), ankle and foot 10/20/2014  . HAMMER TOE, ACQUIRED 07/28/2007   Qualifier: Diagnosis of  By: Lorelei Pont MD, Frederico Hamman    . HLD (hyperlipidemia) 01/14/2018  . Metatarsalgia of left foot 10/20/2014  . Mild coronary artery disease 01/14/2018   LAD with 50% stenosis on [facility] 12/2017  . Mitral regurgitation   . OA (osteoarthritis) of hip 03/24/2013  . PLANTAR FASCIITIS 07/28/2007   Qualifier: Diagnosis of  By: Lorelei Pont MD, Frederico Hamman    . RBBB   . S/P TAVR (transcatheter aortic valve replacement) 11/10/2018   26 mm Edwards Sapien 3 transcatheter heart valve placed via percutaneous right transfemoral approach   . Severe aortic stenosis   . Varicose veins of bilateral lower extremities with other complications 12/06/2701   Past Surgical History:  Procedure Laterality Date  . ABDOMINAL HYSTERECTOMY  03/03/2012   Procedure: HYSTERECTOMY ABDOMINAL;   Surgeon: Alvino Chapel, MD;  Location: [facility];  Service: Gynecology;  Laterality: N/A;  . BIV PACEMAKER INSERTION CRT-P N/A 11/11/2018   Procedure: BIV PACEMAKER INSERTION CRT-P;  Surgeon: Evans Lance, MD;  Location: [place] [facility];  Service: Cardiovascular;  Laterality: N/A;  . BLEPHAROPLASTY Bilateral   . JOINT REPLACEMENT    . 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 RIGHT/LEFT HEART CATH AND CORONARY ANGIOGRAPHY N/A 01/14/2018   Procedure: RIGHT/LEFT HEART CATH AND CORONARY ANGIOGRAPHY;  Surgeon: Wellington Hampshire, MD;  Location: [place] [facility];  Service: Cardiovascular;  Laterality: N/A;  . RIGHT/LEFT HEART CATH AND CORONARY ANGIOGRAPHY N/A 10/23/2018   Procedure: RIGHT/LEFT HEART CATH AND CORONARY ANGIOGRAPHY;  Surgeon: [name], [name], MD;  Location: [place] [facility];  Service: Cardiovascular;  Laterality: N/A;  . SALPINGOOPHORECTOMY  03/03/2012   Procedure: SALPINGO OOPHERECTOMY;  Surgeon: Alvino Chapel, MD;  Location: [facility];  Service: Gynecology;  Laterality: Bilateral;  . TEE WITHOUT CARDIOVERSION N/A 11/10/2018   Procedure: TRANSESOPHAGEAL ECHOCARDIOGRAM (TEE);  Surgeon: Sherren Mocha, MD;  Location: [place] [facility];  Service: Open Heart Surgery;  Laterality: N/A;  . TONSILLECTOMY    . TOTAL HIP ARTHROPLASTY Left 03/24/2013   Procedure: LEFT TOTAL HIP ARTHROPLASTY ANTERIOR APPROACH;  Surgeon: [name], MD;  Location: [facility];  Service: Orthopedics;  Laterality: Left;  . TOTAL HIP ARTHROPLASTY Right 12/18/2016  Procedure: RIGHT TOTAL HIP ARTHROPLASTY ANTERIOR APPROACH;  Surgeon: Gaynelle Arabian, MD;  Location: [facility];  Service: Orthopedics;  Laterality: Right;  . TRANSCATHETER AORTIC VALVE REPLACEMENT, TRANSFEMORAL N/A 11/10/2018   Procedure: TRANSCATHETER AORTIC VALVE REPLACEMENT, TRANSFEMORAL;   Surgeon: Sherren Mocha, MD;  Location: [place] [facility];  Service: Open Heart Surgery;  Laterality: N/A;  . TUBAL LIGATION      Current Outpatient Medications  Medication Sig Dispense Refill  . acetaminophen (TYLENOL) 500 MG tablet Take 500-1,000 mg by mouth every 6 (six) hours as needed for moderate pain.    [name] amoxicillin (AMOXIL) 500 MG tablet Take 4 tablets (2,000 mg total) by mouth as directed. 1 hour prior to dental work including cleanings 12 tablet 12  . aspirin EC 325 MG tablet Take 1 tablet (325 mg total) by mouth daily. 30 tablet 11  . diclofenac sodium (VOLTAREN) 1 % GEL Apply 1 application topically 4 (four) times daily as needed (pain).    [name] estradiol (CLIMARA - DOSED IN MG/24 HR) 0.025 mg/24hr patch PLACE 1 PATCH (0.025 MG TOTAL) ONTO THE SKIN ONCE A WEEK. 4 patch 12  . etodolac (LODINE) 500 MG tablet Take 1 tablet (500 mg total) by mouth daily. 30 tablet 6  . famotidine (PEPCID AC) 10 MG tablet Take 10 mg by mouth daily as needed for heartburn or indigestion.    . Menthol-Methyl Salicylate (MUSCLE RUB EX) Apply 1 application topically daily as needed (muscle pain).    . sacubitril-valsartan (ENTRESTO) 24-26 MG Take 0.5 tablets by mouth 2 (two) times daily.  60 tablet   . UNABLE TO FIND Hyland's Leg Cramps tablets: Take 1 tablet by mouth during the night as needed for leg cramps    . zolpidem (AMBIEN) 10 MG tablet Take 1 tablet (10 mg total) by mouth at bedtime as needed. For sleep 30 tablet 1  . furosemide (LASIX) 20 MG tablet Take 1 tablet (20 mg total) by mouth daily. 90 tablet 3  . potassium chloride SA (K-DUR,KLOR-CON) 20 MEQ tablet Take 0.5 tablets (10 mEq total) by mouth daily. 90 tablet 3   No current facility-administered medications for this visit.     Allergies:   Codeine and Plavix [clopidogrel bisulfate]   Social History: Social History   Socioeconomic History  . Marital status: Widowed    Spouse name: Not on file  . Number of children: Not on file  .  Years of education: Not on file  . Highest education level: Not on file  Occupational History  . Not on file  Social Needs  . Financial resource strain: Not on file  . Food insecurity    Worry: Not on file    Inability: Not on file  . Transportation needs    Medical: Not on file    Non-medical: Not on file  Tobacco Use  . Smoking status: Never Smoker  . Smokeless tobacco: Never Used  Substance and Sexual Activity  . Alcohol use: Yes    Alcohol/week: 7.0 standard drinks    Types: 7 Standard drinks or equivalent per week    Comment: 1 / PER DAY  . Drug use: No  . Sexual activity: Not Currently    Birth control/protection: Surgical    Comment: 1st intercourse 73 yo-Fewer than 5 partners  Lifestyle  . Physical activity    Days per week: Not on file    Minutes per session: Not on file  . Stress: Not on file  Relationships  . Social connections  Talks on phone: Not on file    Gets together: Not on file    Attends religious service: Not on file    Active member of club or organization: Not on file    Attends meetings of clubs or organizations: Not on file    Relationship status: Not on file  . Intimate partner violence    Fear of current or ex partner: Not on file    Emotionally abused: Not on file    Physically abused: Not on file    Forced sexual activity: Not on file  Other Topics Concern  . Not on file  Social History Narrative   Lives locally.  Retired. Active - walks frequently and plays tennis regularly.    Family History: Family History  Problem Relation Age of Onset  . Hypertension Mother   . Bone cancer Father   . Lung cancer Father   . Ovarian cancer Sister   . Diabetes Maternal Aunt   . Breast cancer Maternal Aunt        Age 28's     Review of Systems: All other systems reviewed and are otherwise negative except as noted above.  Physical Exam: Vitals:   02/15/19 1238  BP: 110/60  Pulse: 60  SpO2: 99%  Weight: 154 lb (69.9 kg)  Height: 5'  7.5" (1.715 m)    GEN- The patient is well appearing, alert and oriented x 3 today.   Head- normocephalic, atraumatic Eyes-  Sclera clear, conjunctiva pink Ears- hearing intact Oropharynx- clear Neck- supple,  Lungs- Clear to ausculation bilaterally, normal work of breathing Chest- pacemaker pocket is without hematoma/ bruit Heart- Regular rate and rhythm, no murmurs, rubs or gallops, PMI not laterally displaced GI- soft, NT, ND, + BS Extremities- no clubbing, cyanosis, or edema Neuro- strength and sensation are intact  Pacemaker interrogation- reviewed in detail today,  See paper chart   PPM Interrogation- reviewed in detail today,  See PACEART report  EKG:  EKG is not ordered today.   Recent Labs: 10/05/2018: TSH 2.810 11/09/2018: ALT 15; B Natriuretic Peptide 570.3 11/11/2018: Magnesium 1.9 11/12/2018: BUN 15; Creatinine, Ser 0.96; Hemoglobin 10.3; Platelets 121; Potassium 3.9; Sodium 138   Wt Readings from Last 3 Encounters:  02/15/19 154 lb (69.9 kg)  11/18/18 149 lb (67.6 kg)  11/12/18 152 lb 12.5 oz (69.3 kg)     Other studies Reviewed: Additional studies/ records that were reviewed today include: Previous labs, Op notes  Assessment and Plan:  1.  CHB s/p TAVR  Normal PPM function. Pt with stable intrinsic rate today. Device optimized to promote intrinsic conduction.  See Claudia Desanctis Art report Repeat Echo scheduled for 04/2019.  Current medicines are reviewed at length with the patient today.   The patient does not have concerns regarding her medicines.  The following changes were made today:  None  Labs/ tests ordered today include: None  Disposition:   Follow up with Dr. Lovena Le annually.   Jacalyn Lefevre, PA-C  02/15/2019 2:01 PM  [place] [address] [phone] (office) [phone] (fax)

## 2019-02-15 NOTE — Patient Instructions (Addendum)
Medication Instructions:  none If you need a refill on your cardiac medications before your next appointment, please call your pharmacy.   Lab work: none If you have labs (blood work) drawn today and your tests are completely normal, you will receive your results only by: [name] MyChart Message (if you have MyChart) OR . A paper copy in the mail If you have any lab test that is abnormal or we need to change your treatment, we will call you to review the results.  Testing/Procedures: none  Follow-Up: At [facility], you and your health needs are our priority.  As part of our continuing mission to provide you with exceptional heart care, we have created designated Provider Care Teams.  These Care Teams include your primary Cardiologist (physician) and Advanced Practice Providers (APPs -  Physician Assistants and Nurse Practitioners) who all work together to provide you with the care you need, when you need it. You will need a follow up appointment in 1 year.  Please call our office 2 months in advance to schedule this appointment.  You may see Dr Lovena Le or one of the following Advanced Practice Providers on your designated Care Team:   Chanetta Marshall, NP . Tommye Standard, PA-C  Any Other Special Instructions Will Be Listed Below (If Applicable). Remote monitoring is used to monitor your Pacemaker  from home. This monitoring reduces the number of office visits required to check your device to one time per year. It allows [place] to keep an eye on the functioning of your device to ensure it is working properly. You are scheduled for a device check from home on 05/17/19. You may send your transmission at any time that day. If you have a wireless device, the transmission will be sent automatically. After your physician reviews your transmission, you will receive a postcard with your next transmission date.

## 2019-02-16 ENCOUNTER — Encounter: Payer: PPO | Admitting: Internal Medicine

## 2019-02-22 ENCOUNTER — Ambulatory Visit (INDEPENDENT_AMBULATORY_CARE_PROVIDER_SITE_OTHER): Payer: PPO | Admitting: *Deleted

## 2019-02-22 DIAGNOSIS — I4510 Unspecified right bundle-branch block: Secondary | ICD-10-CM

## 2019-02-22 DIAGNOSIS — I442 Atrioventricular block, complete: Secondary | ICD-10-CM

## 2019-02-22 LAB — CUP PACEART REMOTE DEVICE CHECK
Battery Remaining Longevity: 132 mo
Battery Remaining Percentage: 100 %
Brady Statistic RA Percent Paced: 0 %
Brady Statistic RV Percent Paced: 12 %
Date Time Interrogation Session: 20200622051100
Implantable Lead Implant Date: 20200311
Implantable Lead Implant Date: 20200311
Implantable Lead Implant Date: 20200311
Implantable Lead Location: 753858
Implantable Lead Location: 753859
Implantable Lead Location: 753860
Implantable Lead Model: 4674
Implantable Lead Model: 7741
Implantable Lead Model: 7742
Implantable Lead Serial Number: 1022279
Implantable Lead Serial Number: 1098426
Implantable Lead Serial Number: 830411
Implantable Pulse Generator Implant Date: 20200311
Lead Channel Impedance Value: 1452 Ohm
Lead Channel Impedance Value: 692 Ohm
Lead Channel Impedance Value: 812 Ohm
Lead Channel Pacing Threshold Amplitude: 0.5 V
Lead Channel Pacing Threshold Amplitude: 0.7 V
Lead Channel Pacing Threshold Amplitude: 1.2 V
Lead Channel Pacing Threshold Pulse Width: 0.4 ms
Lead Channel Pacing Threshold Pulse Width: 0.4 ms
Lead Channel Pacing Threshold Pulse Width: 0.4 ms
Lead Channel Setting Pacing Amplitude: 2 V
Lead Channel Setting Pacing Amplitude: 2.5 V
Lead Channel Setting Pacing Amplitude: 2.5 V
Lead Channel Setting Pacing Pulse Width: 0.4 ms
Lead Channel Setting Pacing Pulse Width: 0.4 ms
Lead Channel Setting Sensing Sensitivity: 2.5 mV
Lead Channel Setting Sensing Sensitivity: 2.5 mV
Pulse Gen Serial Number: 747113

## 2019-02-25 ENCOUNTER — Encounter: Payer: Self-pay | Admitting: Thoracic Surgery (Cardiothoracic Vascular Surgery)

## 2019-03-01 NOTE — Progress Notes (Signed)
Remote pacemaker transmission.   

## 2019-03-23 DIAGNOSIS — H40013 Open angle with borderline findings, low risk, bilateral: Secondary | ICD-10-CM | POA: Diagnosis not present

## 2019-03-23 DIAGNOSIS — H43811 Vitreous degeneration, right eye: Secondary | ICD-10-CM | POA: Diagnosis not present

## 2019-03-23 DIAGNOSIS — H2513 Age-related nuclear cataract, bilateral: Secondary | ICD-10-CM | POA: Diagnosis not present

## 2019-03-29 ENCOUNTER — Other Ambulatory Visit: Payer: Self-pay

## 2019-03-30 ENCOUNTER — Ambulatory Visit (INDEPENDENT_AMBULATORY_CARE_PROVIDER_SITE_OTHER): Payer: PPO | Admitting: Gynecology

## 2019-03-30 ENCOUNTER — Encounter: Payer: Self-pay | Admitting: Gynecology

## 2019-03-30 VITALS — BP 120/78 | Ht 67.0 in | Wt 151.0 lb

## 2019-03-30 DIAGNOSIS — N952 Postmenopausal atrophic vaginitis: Secondary | ICD-10-CM

## 2019-03-30 DIAGNOSIS — Z01419 Encounter for gynecological examination (general) (routine) without abnormal findings: Secondary | ICD-10-CM

## 2019-03-30 DIAGNOSIS — G4709 Other insomnia: Secondary | ICD-10-CM | POA: Diagnosis not present

## 2019-03-30 DIAGNOSIS — Z79890 Hormone replacement therapy (postmenopausal): Secondary | ICD-10-CM

## 2019-03-30 MED ORDER — ESTRADIOL 0.025 MG/24HR TD PTWK: MEDICATED_PATCH | TRANSDERMAL | 4 refills | Status: DC

## 2019-03-30 MED ORDER — ZOLPIDEM TARTRATE 10 MG PO TABS: 10.0000 mg | ORAL_TABLET | Freq: Every evening | ORAL | 1 refills | Status: AC | PRN

## 2019-03-30 NOTE — Progress Notes (Addendum)
    Brittany Shaffer 1946-08-27 [id number]        73 y.o.  [id number] for breast and pelvic exam.  Without gynecologic complaints.  Past medical history,surgical history, problem list, medications, allergies, family history and social history were all reviewed and documented as reviewed in the EPIC chart.  ROS:  Performed with pertinent positives and negatives included in the history, assessment and plan.   Additional significant findings : None   Exam: [name] assistant Vitals:   03/30/19 1452  BP: 120/78  Weight: 151 lb (68.5 kg)  Height: 5\' 7"  (1.702 m)   Body mass index is 23.65 kg/m.  General appearance:  Normal affect, orientation and appearance. Skin: Grossly normal HEENT: Without gross lesions.  No cervical or supraclavicular adenopathy. Thyroid normal.  Lungs:  Clear without wheezing, rales or rhonchi Cardiac: RR, without RMG Abdominal:  Soft, nontender, without masses, guarding, rebound, organomegaly or hernia Breasts:  Examined lying and sitting without masses, retractions, discharge or axillary adenopathy. Pelvic:  Ext, BUS, Vagina: With atrophic changes  Adnexa: Without masses or tenderness    Anus and perineum: Normal   Rectovaginal: Normal sphincter tone without palpated masses or tenderness.    Assessment/Plan:  73 y.o. [id number] female for breast and pelvic exam.  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 Marti Sleigh  1. Postmenopausal/HRT.  Continues on Climara 0.025 mg patch.  Recently had heart valve surgery and discontinued for several months before and after.  Developed unacceptable hot flushes and sweats as well as just not feeling well and restarted that.  We discussed the risks particularly with aging to include thrombosis such as stroke heart attack DVT.  I asked her to make sure that her cardiologist is okay with her continuing this with her cardiac history.  We also discussed the breast cancer issue and hormones.  At this point  the patient feels it is a quality of life decision and would like to continue.  I refilled her x1 year. 2. Ambien 10 mg.  Uses one half a tablet occasionally for insomnia.  #30 with 1 refill provided.  Has no side effects or sleep activities with this. 3. Pap smear 2011.  No Pap smear done today.  No history of significant abnormal Pap smears.  We both agree to stop screening per current screening guidelines. 4. DEXA 2014 normal.  Patient is in the process of arranging her mammogram and DEXA at [facility]. 5. Mammography reported 2018.  The patient is in the process of arranging mammogram now at [facility].  Breast exam normal today. 6. Colonoscopy 2011.  Will repeat at their recommended interval. 7. Health maintenance.  No routine lab work done as patient does this elsewhere.  Follow-up 1 year, sooner as needed.   Anastasio Auerbach MD, 3:25 PM 03/30/2019

## 2019-03-30 NOTE — Patient Instructions (Signed)
Schedule your mammogram and bone density at [facility] as we discussed.

## 2019-04-05 ENCOUNTER — Ambulatory Visit (HOSPITAL_COMMUNITY): Payer: PPO | Attending: Cardiology

## 2019-04-05 ENCOUNTER — Other Ambulatory Visit: Payer: Self-pay

## 2019-04-05 DIAGNOSIS — Z952 Presence of prosthetic heart valve: Secondary | ICD-10-CM | POA: Insufficient documentation

## 2019-04-05 MED ORDER — PERFLUTREN LIPID MICROSPHERE
1.0000 mL | INTRAVENOUS | Status: AC | PRN
  Administered 2019-04-05: 2 mL via INTRAVENOUS

## 2019-04-06 ENCOUNTER — Other Ambulatory Visit: Payer: Self-pay | Admitting: Physician Assistant

## 2019-04-06 MED ORDER — ASPIRIN EC 81 MG PO TBEC: 81.0000 mg | DELAYED_RELEASE_TABLET | Freq: Every day | ORAL | 11 refills | Status: DC

## 2019-04-25 NOTE — Progress Notes (Signed)
Cardiology Office Note   Date:  04/26/2019   ID:  Jered, Heiny 1946/01/24, MRN [id number]  PCP:  [name], MD  Cardiologist:   Brissia Delisa [name], MD   Chief Complaint  Patient presents with   Aortic Stenosis   Congestive Heart Failure      History of Present Illness: Brittany Shaffer is a 73 y.o. female who is seen for follow up CHF/ /s/p TAVR.[name] She has a history of RBBB and murmur. She was evaluated by our service in May 2019 when she presented with dyspnea and chest pain. Troponins were negative. Echo showed moderate calcific aortic stenosis. MAC, EF 50-55% and mild pulmonary HTN. She underwent R/LHC which revealed near normal coronary arteries with only moderate stenosis (50%) in the apical LAD, with high normal filling pressures, normal pulmonary pressure, normal cardiac output, mild AS, and moderate MR. AV gradient was 7 mm at cath. She was recommended to continue medial therapy.   She continued to have symptoms of dyspnea on exertion.   She was evaluated by Pulmonary- Dr Wonda Amis. CT chest showed no ILD but was consistent with mild edema. PFTs showed  moderate restriction and moderate diffusion abnormality.    CT chest with contrast showed no evidence of PE but there were bilateral pleural effusions and evidence of interstitial pulmonary edema. Repeat Echo showed marked decrease in LV systolic function with EF 25-30% and restrictive filling parameters. Severe pulmonary HTN. Severe AS (low flow) with mean gradient of 25 mm Hg but AV are 0.69 cm squared. Based on these findings she was started on lasix 40 mg daily with potassium and Entresto 24/26 mg bid. After starting the [facility] she developed hypotension and dose was reduced by half.   She underwent cardiac cath showing nonobstructive CAD. Moderate to severe low gradient AS. EF 35-45%.  Normal right heart pressures.  She underwent successful TAVR with a80m Edwards Sapien 3 THV via the TF approach on 11/10/18. Post  operative echoshowed EF 40-45%, normally functioning TAVR with mean gradient of 15.432mHg and mild PVL. Hospital course was complicated by intraprocedural CHB. She underwent successful CRT-P by Dr. TaLovena Len 11/11/18. She did develop a pocket hematoma that required compression. Plavix was held for several weeks. She developed a rash on Plavix and so was started on ASA 525 mg daily. Last pacemaker check in June showed normal function.  On follow up today she notes her breathing has improved and her lower extremity edema has resolved. She is feeling well except she feels tired in the am. She notes some cough and chest pressure when she is in the deep end of the pool. Thinks cough may be related to [facility]    Past Medical History:  Diagnosis Date   Abnormality of gait 10/20/2014   Arthritis    "was in my hips; still in my knees" (01/13/2018)   Bicuspid aortic valve    Chronic combined systolic and diastolic heart failure (HCC)    DJD (degenerative joint disease), ankle and foot 10/20/2014   HAMMER TOE, ACQUIRED 07/28/2007   Qualifier: Diagnosis of  By: Copland MD, Spencer     HLD (hyperlipidemia) 01/14/2018   Metatarsalgia of left foot 10/20/2014   Mild coronary artery disease 01/14/2018   [facility]   Mitral regurgitation    OA (osteoarthritis) of hip 03/24/2013   PLANTAR FASCIITIS 07/28/2007   Qualifier: Diagnosis of  By: CoLorelei PontD, Spencer     RBBB  S/P TAVR (transcatheter aortic valve replacement) 11/10/2018   26 mm Edwards Sapien 3 transcatheter heart valve placed via percutaneous right transfemoral approach    Severe aortic stenosis    Varicose veins of bilateral lower extremities with other complications 03/02/2457    Past Surgical History:  Procedure Laterality Date   ABDOMINAL HYSTERECTOMY  03/03/2012   Procedure: HYSTERECTOMY ABDOMINAL;  Surgeon: Alvino Chapel, MD;  Location: [facility];  Service: Gynecology;  Laterality: N/A;    BIV PACEMAKER INSERTION CRT-P N/A 11/11/2018   Procedure: BIV PACEMAKER INSERTION CRT-P;  Surgeon: Evans Lance, MD;  Location: [place] [facility];  Service: Cardiovascular;  Laterality: N/A;   BLEPHAROPLASTY Bilateral    JOINT REPLACEMENT     LAPAROTOMY  03/03/2012   Procedure: EXPLORATORY LAPAROTOMY;  Surgeon: Alvino Chapel, MD;  Location: [facility];  Service: Gynecology;  Laterality: N/A; 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   RIGHT/LEFT HEART CATH AND CORONARY ANGIOGRAPHY N/A 01/14/2018   Procedure: RIGHT/LEFT HEART CATH AND CORONARY ANGIOGRAPHY;  Surgeon: Wellington Hampshire, MD;  Location: [place] [facility];  Service: Cardiovascular;  Laterality: N/A;   RIGHT/LEFT HEART CATH AND CORONARY ANGIOGRAPHY N/A 10/23/2018   Procedure: RIGHT/LEFT HEART CATH AND CORONARY ANGIOGRAPHY;  Surgeon: Shaffer, Dorothie Wah M, MD;  Location: [place] [facility];  Service: Cardiovascular;  Laterality: N/A;   SALPINGOOPHORECTOMY  03/03/2012   Procedure: SALPINGO OOPHERECTOMY;  Surgeon: Alvino Chapel, MD;  Location: [facility];  Service: Gynecology;  Laterality: Bilateral;   TEE WITHOUT CARDIOVERSION N/A 11/10/2018   Procedure: TRANSESOPHAGEAL ECHOCARDIOGRAM (TEE);  Surgeon: Sherren Mocha, MD;  Location: [place] [facility];  Service: Open Heart Surgery;  Laterality: N/A;   TONSILLECTOMY     TOTAL HIP ARTHROPLASTY Left 03/24/2013   Procedure: LEFT TOTAL HIP ARTHROPLASTY ANTERIOR APPROACH;  Surgeon: [name], MD;  Location: [facility];  Service: Orthopedics;  Laterality: Left;   TOTAL HIP ARTHROPLASTY Right 12/18/2016   Procedure: RIGHT TOTAL HIP ARTHROPLASTY ANTERIOR APPROACH;  Surgeon: Gaynelle Arabian, MD;  Location: [facility];  Service: Orthopedics;  Laterality: Right;   TRANSCATHETER AORTIC VALVE REPLACEMENT, TRANSFEMORAL N/A 11/10/2018   Procedure: TRANSCATHETER AORTIC VALVE REPLACEMENT, TRANSFEMORAL;  Surgeon: Sherren Mocha, MD;  Location: [place] [facility];  Service: Open Heart Surgery;  Laterality:  N/A;   TUBAL LIGATION       Current Outpatient Medications  Medication Sig Dispense Refill   acetaminophen (TYLENOL) 500 MG tablet Take 500-1,000 mg by mouth every 6 (six) hours as needed for moderate pain.     amoxicillin (AMOXIL) 500 MG tablet Take 4 tablets (2,000 mg total) by mouth as directed. 1 hour prior to dental work including cleanings 12 tablet 12   diclofenac sodium (VOLTAREN) 1 % GEL Apply 1 application topically 4 (four) times daily as needed (pain).     estradiol (CLIMARA - DOSED IN MG/24 HR) 0.025 mg/24hr patch PLACE 1 PATCH (0.025 MG TOTAL) ONTO THE SKIN ONCE A WEEK. 12 patch 4   etodolac (LODINE) 500 MG tablet Take 1 tablet (500 mg total) by mouth daily. 30 tablet 6   famotidine (PEPCID AC) 10 MG tablet Take 10 mg by mouth daily as needed for heartburn or indigestion.     Menthol-Methyl Salicylate (MUSCLE RUB EX) Apply 1 application topically daily as needed (muscle pain).     sacubitril-valsartan (ENTRESTO) 24-26 MG Take 0.5 tablets by mouth 2 (two) times daily.  60 tablet    UNABLE TO FIND Hyland's Leg Cramps tablets: Take 1 tablet by mouth  during the night as needed for leg cramps     zolpidem (AMBIEN) 10 MG tablet Take 1 tablet (10 mg total) by mouth at bedtime as needed. For sleep 30 tablet 1   No current facility-administered medications for this visit.     Allergies:   Codeine and Plavix [clopidogrel bisulfate]    Social History:  The patient  reports that she has never smoked. She has never used smokeless tobacco. She reports current alcohol use of about 7.0 standard drinks of alcohol per week. She reports that she does not use drugs.   Family History:  The patient's family history includes Bone cancer in her father; Breast cancer in her maternal aunt; Diabetes in her maternal aunt; Hypertension in her mother; Lung cancer in her father; Ovarian cancer in her sister.    ROS:  Please see the history of present illness.   Otherwise, review of systems are  positive for none.   All other systems are reviewed and negative.    PHYSICAL EXAM: VS:  BP (!) 156/76    Pulse (!) 58    Temp (!) 97 F (36.1 C)    Ht 5' 7.5" (1.715 m)    Wt 155 lb (70.3 kg)    SpO2 98%    BMI 23.92 kg/m  , BMI Body mass index is 23.92 kg/m. GEN: Well nourished, well developed, in no acute distress  HEENT: normal  Neck: no JVD, carotid bruits, or masses Cardiac: RRR; gr 2/6 systolic murmur RUSB, S2 reduced. No rubs or gallops. Respiratory:  clear to auscultation bilaterally, normal work of breathing GI: soft, nontender, nondistended, + BS MS: no deformity or atrophy. No edema and left leg is larger than right. Skin: warm and dry, no rash Neuro:  Strength and sensation are intact Psych: euthymic mood, full affect   EKG:  EKG is not ordered today.    Recent Labs: 10/05/2018: TSH 2.810 11/09/2018: ALT 15; B Natriuretic Peptide 570.3 11/11/2018: Magnesium 1.9 11/12/2018: BUN 15; Creatinine, Ser 0.96; Hemoglobin 10.3; Platelets 121; Potassium 3.9; Sodium 138    Lipid Panel    Component Value Date/Time   CHOL 193 01/14/2018 0212   TRIG 55 01/14/2018 0212   HDL 85 01/14/2018 0212   CHOLHDL 2.3 01/14/2018 0212   VLDL 11 01/14/2018 0212   LDLCALC 97 01/14/2018 0212    Labs dated 10/12/18: cholesterol 231, triglycerides 86, HDL 83, LDL 131. Creatinine 1.12, potassium 5.8. ALT and CBC normal.   Wt Readings from Last 3 Encounters:  04/26/19 155 lb (70.3 kg)  03/30/19 151 lb (68.5 kg)  02/15/19 154 lb (69.9 kg)      Other studies Reviewed: Additional studies/ records that were reviewed today include:  Cardiac cath 10/23/18:  RIGHT/LEFT HEART CATH AND CORONARY ANGIOGRAPHY  Conclusion    Dist LAD lesion is 30% stenosed.  There is moderate left ventricular systolic dysfunction.  The left ventricular ejection fraction is 35-45% by visual estimate.  LV end diastolic pressure is normal.  There is moderate aortic valve stenosis.   1. Minimal nonobstructive  CAD 2. Moderate LV dysfunction. EF 35-40%. 3. At least moderate AS. Mean gradient 27 mm Hg. AVA may be underestimated due to decreased LV function 4. Normal LV filling pressures. 5. Normal right heart pressures.  Plan: will refer to Valvular heart team for consideration of TAVR.        Echo 04/05/19: IMPRESSIONS    1. The left ventricle has low normal systolic function, with an ejection fraction of  50-55%. The cavity size was normal. Left ventricular diastolic Doppler parameters are consistent with impaired relaxation.  2. The right ventricle has normal systolic function. The cavity was normal.  3. Left atrial size was mildly dilated.  4. The tricuspid valve is grossly normal. Tricuspid valve regurgitation is mild-moderate.  5. A 26 an Edwards Edwards Sapien bioprosthetic aortic valve (TAVR) valve is present in the aortic position. Procedure Date: 11/10/18 Normal aortic valve prosthesis.  6. The aorta is normal in size and structure.  7. Definity used; Low normal LV function; mild diastolic dysfunction; s/p AVR with mean gradient of 12 mmHg and mild perivalvular AI; mild LAE; mild to moderate TR.     ASSESSMENT AND PLAN:  1.  Chronic systolic CHF. EF improved post TAVR to 50-55%. Prior cardiac cath showed fairly normal filling pressures, right heart pressures and cardiac output. I think her drop in EF was related to her AS. Will stop Entresto now and see if cough resolves.  2. CAD with nonobstructive disease.  3. Severe low gradient  aortic stenosis. S/p TAVR in March 2020. Plan for ASA 325 mg months until September then reduce to 81 mg daily.  4. Complete heart block s/p PPM- stable. 5. Osteoarthritis.    Signed, Brittany Mcconahy [name], MD  04/26/2019 10:06 AM    [place] [address], [place], [place], [address] Phone [phone], Fax [phone]

## 2019-04-26 ENCOUNTER — Other Ambulatory Visit: Payer: Self-pay

## 2019-04-26 ENCOUNTER — Encounter: Payer: Self-pay | Admitting: Cardiology

## 2019-04-26 ENCOUNTER — Ambulatory Visit (INDEPENDENT_AMBULATORY_CARE_PROVIDER_SITE_OTHER): Payer: PPO | Admitting: Cardiology

## 2019-04-26 VITALS — BP 156/76 | HR 58 | Temp 97.0°F | Ht 67.5 in | Wt 155.0 lb

## 2019-04-26 DIAGNOSIS — I5022 Chronic systolic (congestive) heart failure: Secondary | ICD-10-CM | POA: Diagnosis not present

## 2019-04-26 DIAGNOSIS — I350 Nonrheumatic aortic (valve) stenosis: Secondary | ICD-10-CM | POA: Diagnosis not present

## 2019-04-26 DIAGNOSIS — Z952 Presence of prosthetic heart valve: Secondary | ICD-10-CM

## 2019-04-26 DIAGNOSIS — I442 Atrioventricular block, complete: Secondary | ICD-10-CM | POA: Diagnosis not present

## 2019-04-26 MED ORDER — ASCRIPTIN 325 MG PO TABS: 325.0000 mg | ORAL_TABLET | Freq: Every day | ORAL | 0 refills | Status: DC

## 2019-04-26 NOTE — Patient Instructions (Addendum)
Stop taking Entresto and we will monitor  On September 10 reduce ASA to 81 mg daily

## 2019-05-03 ENCOUNTER — Other Ambulatory Visit: Payer: Self-pay | Admitting: Medical

## 2019-05-03 ENCOUNTER — Other Ambulatory Visit: Payer: Self-pay

## 2019-05-03 MED ORDER — ASCRIPTIN 325 MG PO TABS: 325.0000 mg | ORAL_TABLET | Freq: Every day | ORAL | 0 refills | Status: DC

## 2019-05-05 ENCOUNTER — Telehealth: Payer: Self-pay

## 2019-05-05 NOTE — Telephone Encounter (Signed)
Pt wanted to know if I can change the date of her home remote appointment because she will be out of town. I changed it for her. She wanted to know if she should take the monitor with her on vacation. I told her if she going to be gone more than 3 days take the monitor with her. Pt wanted to know should she take special precautions or will being in the air affect her ppm? I told her to wear a mask, and take her [facility] scientific card stating she has a ppm. She should be fine flying. The pt states I answered her questions and thanked me for the call.

## 2019-05-05 NOTE — Telephone Encounter (Signed)
This is Dr. Jordan's pt. °

## 2019-05-05 NOTE — Telephone Encounter (Signed)
Requested Prescriptions   Signed Prescriptions Disp Refills  . aspirin (ASPIRIN LOW DOSE) 81 MG EC tablet 30 tablet 5    Sig: Take 1 tablet (81 mg total) by mouth daily.    Authorizing Provider: [name], [name]    Ordering User: Raelene Bott, BRANDY L

## 2019-05-06 ENCOUNTER — Encounter: Payer: Self-pay | Admitting: Gynecology

## 2019-05-06 DIAGNOSIS — Z1231 Encounter for screening mammogram for malignant neoplasm of breast: Secondary | ICD-10-CM | POA: Diagnosis not present

## 2019-05-06 DIAGNOSIS — Z780 Asymptomatic menopausal state: Secondary | ICD-10-CM | POA: Diagnosis not present

## 2019-05-06 DIAGNOSIS — Z803 Family history of malignant neoplasm of breast: Secondary | ICD-10-CM | POA: Diagnosis not present

## 2019-05-06 DIAGNOSIS — Z96643 Presence of artificial hip joint, bilateral: Secondary | ICD-10-CM | POA: Diagnosis not present

## 2019-05-06 DIAGNOSIS — Z90710 Acquired absence of both cervix and uterus: Secondary | ICD-10-CM | POA: Diagnosis not present

## 2019-05-19 ENCOUNTER — Encounter: Payer: Self-pay | Admitting: Gynecology

## 2019-06-01 ENCOUNTER — Ambulatory Visit (INDEPENDENT_AMBULATORY_CARE_PROVIDER_SITE_OTHER): Payer: PPO | Admitting: *Deleted

## 2019-06-01 DIAGNOSIS — I4510 Unspecified right bundle-branch block: Secondary | ICD-10-CM

## 2019-06-01 DIAGNOSIS — I5043 Acute on chronic combined systolic (congestive) and diastolic (congestive) heart failure: Secondary | ICD-10-CM

## 2019-06-02 LAB — CUP PACEART REMOTE DEVICE CHECK
Battery Remaining Longevity: 132 mo
Battery Remaining Percentage: 100 %
Brady Statistic RA Percent Paced: 1 %
Brady Statistic RV Percent Paced: 13 %
Date Time Interrogation Session: 20200929051100
Implantable Lead Implant Date: 20200311
Implantable Lead Implant Date: 20200311
Implantable Lead Implant Date: 20200311
Implantable Lead Location: 753858
Implantable Lead Location: 753859
Implantable Lead Location: 753860
Implantable Lead Model: 4674
Implantable Lead Model: 7741
Implantable Lead Model: 7742
Implantable Lead Serial Number: 1022279
Implantable Lead Serial Number: 1098426
Implantable Lead Serial Number: 830411
Implantable Pulse Generator Implant Date: 20200311
Lead Channel Impedance Value: 1254 Ohm
Lead Channel Impedance Value: 689 Ohm
Lead Channel Impedance Value: 761 Ohm
Lead Channel Pacing Threshold Amplitude: 0.5 V
Lead Channel Pacing Threshold Amplitude: 0.7 V
Lead Channel Pacing Threshold Amplitude: 1.2 V
Lead Channel Pacing Threshold Pulse Width: 0.4 ms
Lead Channel Pacing Threshold Pulse Width: 0.4 ms
Lead Channel Pacing Threshold Pulse Width: 0.4 ms
Lead Channel Setting Pacing Amplitude: 2 V
Lead Channel Setting Pacing Amplitude: 2.5 V
Lead Channel Setting Pacing Amplitude: 2.5 V
Lead Channel Setting Pacing Pulse Width: 0.4 ms
Lead Channel Setting Pacing Pulse Width: 0.4 ms
Lead Channel Setting Sensing Sensitivity: 2.5 mV
Lead Channel Setting Sensing Sensitivity: 2.5 mV
Pulse Gen Serial Number: 747113

## 2019-06-10 ENCOUNTER — Encounter: Payer: Self-pay | Admitting: Gynecology

## 2019-06-10 NOTE — Progress Notes (Signed)
Remote pacemaker transmission.   

## 2019-07-14 ENCOUNTER — Telehealth: Payer: Self-pay | Admitting: Pulmonary Disease

## 2019-07-14 NOTE — Telephone Encounter (Signed)
Spoke with the pt  She states that she feels that the ct chest for Dec 2020 would not be necessary bc she had a valve replacement  She states that she has had 4 ct scans already this year in Feb and March and feels like that should be sufficient  I advised that we are looking to f/u on her pulmonary nodule, and the scan should be done w/o cm and her scans were done with cm and were not only of her chest  She still wants Dr Vaughan Browner to review and advise if this is necessary  Please advise thanks!

## 2019-07-16 NOTE — Telephone Encounter (Signed)
Patient is returning phone call.  Patient phone number is [phone].

## 2019-07-16 NOTE — Telephone Encounter (Signed)
LMTCB

## 2019-07-16 NOTE — Telephone Encounter (Signed)
Pt returning call.  [phone].

## 2019-07-16 NOTE — Telephone Encounter (Signed)
Called and spoke with patient. Dr. Matilde Bash recommendations given.  Understanding stated.  Patient requested to call back in December to schedule OV with Dr. Vaughan Browner in January 2021. Nothing further at this time.

## 2019-07-16 NOTE — Telephone Encounter (Signed)
We can cancel for now as patient is at low risk for malignancy as she has minimal smoking history Please make routine follow-up visit in clinic with me

## 2019-08-10 ENCOUNTER — Telehealth: Payer: Self-pay | Admitting: Pulmonary Disease

## 2019-08-10 NOTE — Telephone Encounter (Signed)
I spoke with pt and advised her of message from Dr. Vaughan Browner. She states she had a cardiac procedure done and feels she can hokld off on CT scan. Dr. Vaughan Browner did state it was ok to hold off from a conversation on 07/14/2019. I reassured her that she could cancel the appt if she wanted to but to make sure she follows up with Dr,. Mannam. She stated she would call back in January to schedule appt. Nothing further I needed.     Marshell Garfinkel, MD    12:35 PM Note   We can cancel for now as patient is at low risk for malignancy as she has minimal smoking history Please make routine follow-up visit in clinic with me

## 2019-08-11 ENCOUNTER — Inpatient Hospital Stay: Admission: RE | Admit: 2019-08-11 | Payer: PPO | Source: Ambulatory Visit

## 2019-08-11 ENCOUNTER — Other Ambulatory Visit: Payer: Self-pay

## 2019-08-11 DIAGNOSIS — Z20822 Contact with and (suspected) exposure to covid-19: Secondary | ICD-10-CM

## 2019-08-12 LAB — NOVEL CORONAVIRUS, NAA: SARS-CoV-2, NAA: DETECTED — AB

## 2019-08-14 ENCOUNTER — Other Ambulatory Visit: Payer: Self-pay | Admitting: Unknown Physician Specialty

## 2019-08-14 ENCOUNTER — Telehealth: Payer: Self-pay | Admitting: Unknown Physician Specialty

## 2019-08-14 DIAGNOSIS — U071 COVID-19: Secondary | ICD-10-CM

## 2019-08-14 NOTE — Telephone Encounter (Signed)
  I connected by phone with Brittany Shaffer on 08/14/2019 at 11:11 AM to discuss the potential use of an new treatment for mild to moderate COVID-19 viral infection in non-hospitalized patients.  This patient is a 73 y.o. female that meets the FDA criteria for Emergency Use Authorization of bamlanivimab or casirivimab\imdevimab.  Has a (+) direct SARS-CoV-2 viral test result  Has mild or moderate COVID-19   Is ? 75 years of age and weighs ? 40 kg  Is NOT hospitalized due to COVID-19  Is NOT requiring oxygen therapy or requiring an increase in baseline oxygen flow rate due to COVID-19  Is within 10 days of symptom onset  Has at least one of the high risk factor(s) for progression to severe COVID-19 and/or hospitalization as defined in EUA.  Specific high risk criteria : >/= 75 yo   I have spoken and communicated the following to the patient or parent/caregiver:  1. FDA has authorized the emergency use of bamlanivimab and casirivimab\imdevimab for the treatment of mild to moderate COVID-19 in adults and pediatric patients with positive results of direct SARS-CoV-2 viral testing who are 61 years of age and older weighing at least 40 kg, and who are at high risk for progressing to severe COVID-19 and/or hospitalization.  2. The significant known and potential risks and benefits of bamlanivimab and casirivimab\imdevimab, and the extent to which such potential risks and benefits are unknown.  3. Information on available alternative treatments and the risks and benefits of those alternatives, including clinical trials.  4. Patients treated with bamlanivimab and casirivimab\imdevimab should continue to self-isolate and use infection control measures (e.g., wear mask, isolate, social distance, avoid sharing personal items, clean and disinfect "high touch" surfaces, and frequent handwashing) according to CDC guidelines.   5. The patient or parent/caregiver has the option to accept or refuse  bamlanivimab or casirivimab\imdevimab .  After reviewing this information with the patient, The patient agreed to proceed with receiving the bamlanimivab infusion and will be provided a copy of the Fact sheet prior to receiving the infusion.   [name] 08/14/2019 11:11 AM

## 2019-08-16 ENCOUNTER — Ambulatory Visit (HOSPITAL_COMMUNITY)
Admission: RE | Admit: 2019-08-16 | Discharge: 2019-08-16 | Disposition: A | Discharge status: Home / Self Care | Payer: Medicare Other | Source: Ambulatory Visit | Attending: Critical Care Medicine | Admitting: Critical Care Medicine

## 2019-08-16 DIAGNOSIS — Z23 Encounter for immunization: Secondary | ICD-10-CM | POA: Diagnosis not present

## 2019-08-16 DIAGNOSIS — U071 COVID-19: Secondary | ICD-10-CM | POA: Insufficient documentation

## 2019-08-16 MED ORDER — FAMOTIDINE IN NACL 20-0.9 MG/50ML-% IV SOLN: 20.0000 mg | Freq: Once | INTRAVENOUS | Status: DC | PRN

## 2019-08-16 MED ORDER — SODIUM CHLORIDE 0.9 % IV SOLN
700.0000 mg | Freq: Once | INTRAVENOUS | Status: AC
  Administered 2019-08-16: 700 mg via INTRAVENOUS
  Filled 2019-08-16: qty 20

## 2019-08-16 MED ORDER — ALBUTEROL SULFATE HFA 108 (90 BASE) MCG/ACT IN AERS: 2.0000 | INHALATION_SPRAY | Freq: Once | RESPIRATORY_TRACT | Status: DC | PRN

## 2019-08-16 MED ORDER — EPINEPHRINE 0.3 MG/0.3ML IJ SOAJ: 0.3000 mg | Freq: Once | INTRAMUSCULAR | Status: DC | PRN

## 2019-08-16 MED ORDER — METHYLPREDNISOLONE SODIUM SUCC 125 MG IJ SOLR: 125.0000 mg | Freq: Once | INTRAMUSCULAR | Status: DC | PRN

## 2019-08-16 MED ORDER — DIPHENHYDRAMINE HCL 50 MG/ML IJ SOLN: 50.0000 mg | Freq: Once | INTRAMUSCULAR | Status: DC | PRN

## 2019-08-16 MED ORDER — SODIUM CHLORIDE 0.9 % IV SOLN
INTRAVENOUS | Status: DC | PRN
  Administered 2019-08-16: 250 mL via INTRAVENOUS

## 2019-08-16 NOTE — Progress Notes (Signed)
  Diagnosis: COVID-19  Physician:  Dr. Joya Gaskins  Procedure: [facility] Med: bamlanivimab infusion - Provided patient with bamlanimivab fact sheet for patients, parents and caregivers prior to infusion.  Complications: No immediate complications noted.  Discharge: Discharged home   Brittany Shaffer 08/16/2019

## 2019-08-31 ENCOUNTER — Ambulatory Visit (INDEPENDENT_AMBULATORY_CARE_PROVIDER_SITE_OTHER): Payer: PPO | Admitting: *Deleted

## 2019-08-31 DIAGNOSIS — I4510 Unspecified right bundle-branch block: Secondary | ICD-10-CM

## 2019-08-31 LAB — CUP PACEART REMOTE DEVICE CHECK
Battery Remaining Longevity: 132 mo
Battery Remaining Percentage: 100 %
Brady Statistic RA Percent Paced: 1 %
Brady Statistic RV Percent Paced: 13 %
Date Time Interrogation Session: 20201229011100
Implantable Lead Implant Date: 20200311
Implantable Lead Implant Date: 20200311
Implantable Lead Implant Date: 20200311
Implantable Lead Location: 753858
Implantable Lead Location: 753859
Implantable Lead Location: 753860
Implantable Lead Model: 4674
Implantable Lead Model: 7741
Implantable Lead Model: 7742
Implantable Lead Serial Number: 1022279
Implantable Lead Serial Number: 1098426
Implantable Lead Serial Number: 830411
Implantable Pulse Generator Implant Date: 20200311
Lead Channel Impedance Value: 1113 Ohm
Lead Channel Impedance Value: 722 Ohm
Lead Channel Impedance Value: 733 Ohm
Lead Channel Pacing Threshold Amplitude: 0.5 V
Lead Channel Pacing Threshold Amplitude: 0.9 V
Lead Channel Pacing Threshold Amplitude: 1.2 V
Lead Channel Pacing Threshold Pulse Width: 0.4 ms
Lead Channel Pacing Threshold Pulse Width: 0.4 ms
Lead Channel Pacing Threshold Pulse Width: 0.4 ms
Lead Channel Setting Pacing Amplitude: 2 V
Lead Channel Setting Pacing Amplitude: 2.5 V
Lead Channel Setting Pacing Amplitude: 2.5 V
Lead Channel Setting Pacing Pulse Width: 0.4 ms
Lead Channel Setting Pacing Pulse Width: 0.4 ms
Lead Channel Setting Sensing Sensitivity: 2.5 mV
Lead Channel Setting Sensing Sensitivity: 2.5 mV
Pulse Gen Serial Number: 747113

## 2019-09-28 ENCOUNTER — Telehealth: Payer: Self-pay

## 2019-09-28 NOTE — Telephone Encounter (Signed)
Spoke with pt, transmission received, reviewed.  There were no cardiac episodes on 1/23.

## 2019-09-28 NOTE — Telephone Encounter (Signed)
Pt called to receive help sending a manual transmission per Amy, Rn. I told her Amy would review the transmission and give her a call back. The pt would also like to know how she should sleep with the pacemaker. I told her she should be able to sleep in her comfortable position by now. I advised her to ask Amy when she call her back.

## 2019-10-18 ENCOUNTER — Other Ambulatory Visit: Payer: Self-pay | Admitting: Cardiology

## 2019-10-21 NOTE — Progress Notes (Signed)
Cardiology Office Note   Date:  10/25/2019   ID:  Ashaad, Gaertner Apr 22, 1946, MRN [id number]  PCP:  [name], MD  Cardiologist:   Edwyna Dangerfield [name], MD   Chief Complaint  Patient presents with  . Congestive Heart Failure      History of Present Illness: Brittany Shaffer is a 74 y.o. female who is seen for follow up CHF/ /s/p TAVR.[name] She has a history of RBBB and murmur. She was evaluated by our service in May 2019 when she presented with dyspnea and chest pain. Troponins were negative. Echo showed moderate calcific aortic stenosis. MAC, EF 50-55% and mild pulmonary HTN. She underwent R/LHC which revealed near normal coronary arteries with only moderate stenosis (50%) in the apical LAD, with high normal filling pressures, normal pulmonary pressure, normal cardiac output, mild AS, and moderate MR. AV gradient was 7 mm at cath. She was recommended to continue medial therapy.   She continued to have symptoms of dyspnea on exertion.   She was evaluated by Pulmonary- Dr Wonda Amis. CT chest showed no ILD but was consistent with mild edema. PFTs showed  moderate restriction and moderate diffusion abnormality.    CT chest with contrast showed no evidence of PE but there were bilateral pleural effusions and evidence of interstitial pulmonary edema. Repeat Echo showed marked decrease in LV systolic function with EF 25-30% and restrictive filling parameters. Severe pulmonary HTN. Severe AS (low flow) with mean gradient of 25 mm Hg but AV are 0.69 cm squared. Based on these findings she was started on lasix 40 mg daily with potassium and Entresto 24/26 mg bid. After starting the [facility] she developed hypotension and dose was reduced by half.   She underwent cardiac cath showing nonobstructive CAD. Moderate to severe low gradient AS. EF 35-45%.  Normal right heart pressures.  She underwent successful TAVR with a3m Edwards Sapien 3 THV via the TF approach on 11/10/18. Post operative echoshowed EF  40-45%, normally functioning TAVR with mean gradient of 15.413mHg and mild PVL. Hospital course was complicated by intraprocedural CHB. She underwent successful CRT-P by Dr. TaLovena Len 11/11/18.  Last pacemaker check on 08/31/19 showed normal pacemaker function with Afib burden < 1% and all episodes < 20 minutes. Most recent Echo in August showed improvement in EF to 50-55%.  On follow up today she reports that she had Covid 19 in December. Received the antibody cocktail. She is almost fully recovered and is back playing tennis now. She is off Entresto now. Not clear if this was causing some cough before. No dyspnea, chest pain or palpitations now.      Past Medical History:  Diagnosis Date  . Abnormality of gait 10/20/2014  . Arthritis    "was in my hips; still in my knees" (01/13/2018)  . Bicuspid aortic valve   . Chronic combined systolic and diastolic heart failure (H[place]  . DJD (degenerative joint disease), ankle and foot 10/20/2014  . HAMMER TOE, ACQUIRED 07/28/2007   Qualifier: Diagnosis of  By: CoLorelei PontD, SpFrederico Hamman  . HLD (hyperlipidemia) 01/14/2018  . Metatarsalgia of left foot 10/20/2014  . Mild coronary artery disease 01/14/2018   [facility]  . Mitral regurgitation   . OA (osteoarthritis) of hip 03/24/2013  . PLANTAR FASCIITIS 07/28/2007   Qualifier: Diagnosis of  By: CoLorelei PontD, SpFrederico Hamman  . RBBB   . S/P TAVR (transcatheter aortic valve replacement) 11/10/2018   26  mm Edwards Sapien 3 transcatheter heart valve placed via percutaneous right transfemoral approach   . Severe aortic stenosis   . Varicose veins of bilateral lower extremities with other complications 03/03/6202    Past Surgical History:  Procedure Laterality Date  . ABDOMINAL HYSTERECTOMY  03/03/2012   Procedure: HYSTERECTOMY ABDOMINAL;  Surgeon: Alvino Chapel, MD;  Location: [facility];  Service: Gynecology;  Laterality: N/A;  . BIV PACEMAKER INSERTION CRT-P N/A 11/11/2018   Procedure: BIV  PACEMAKER INSERTION CRT-P;  Surgeon: Evans Lance, MD;  Location: [place] [facility];  Service: Cardiovascular;  Laterality: N/A;  . BLEPHAROPLASTY Bilateral   . JOINT REPLACEMENT    . 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 RIGHT/LEFT HEART CATH AND CORONARY ANGIOGRAPHY N/A 01/14/2018   Procedure: RIGHT/LEFT HEART CATH AND CORONARY ANGIOGRAPHY;  Surgeon: Wellington Hampshire, MD;  Location: [place] [facility];  Service: Cardiovascular;  Laterality: N/A;  . RIGHT/LEFT HEART CATH AND CORONARY ANGIOGRAPHY N/A 10/23/2018   Procedure: RIGHT/LEFT HEART CATH AND CORONARY ANGIOGRAPHY;  Surgeon: [name], Dawson Albers M, MD;  Location: [place] [facility];  Service: Cardiovascular;  Laterality: N/A;  . SALPINGOOPHORECTOMY  03/03/2012   Procedure: SALPINGO OOPHERECTOMY;  Surgeon: Alvino Chapel, MD;  Location: [facility];  Service: Gynecology;  Laterality: Bilateral;  . TEE WITHOUT CARDIOVERSION N/A 11/10/2018   Procedure: TRANSESOPHAGEAL ECHOCARDIOGRAM (TEE);  Surgeon: Sherren Mocha, MD;  Location: [place] [facility];  Service: Open Heart Surgery;  Laterality: N/A;  . TONSILLECTOMY    . TOTAL HIP ARTHROPLASTY Left 03/24/2013   Procedure: LEFT TOTAL HIP ARTHROPLASTY ANTERIOR APPROACH;  Surgeon: [name], MD;  Location: [facility];  Service: Orthopedics;  Laterality: Left;  . TOTAL HIP ARTHROPLASTY Right 12/18/2016   Procedure: RIGHT TOTAL HIP ARTHROPLASTY ANTERIOR APPROACH;  Surgeon: Gaynelle Arabian, MD;  Location: [facility];  Service: Orthopedics;  Laterality: Right;  . TRANSCATHETER AORTIC VALVE REPLACEMENT, TRANSFEMORAL N/A 11/10/2018   Procedure: TRANSCATHETER AORTIC VALVE REPLACEMENT, TRANSFEMORAL;  Surgeon: Sherren Mocha, MD;  Location: [place] [facility];  Service: Open Heart Surgery;  Laterality: N/A;  . TUBAL LIGATION       Current Outpatient Medications    Medication Sig Dispense Refill  . acetaminophen (TYLENOL) 500 MG tablet Take 500-1,000 mg by mouth every 6 (six) hours as needed for moderate pain.    [name] amoxicillin (AMOXIL) 500 MG tablet Take 4 tablets (2,000 mg total) by mouth as directed. 1 hour prior to dental work including cleanings 12 tablet 12  . aspirin (ASPIRIN LOW DOSE) 81 MG EC tablet Take 1 tablet (81 mg total) by mouth daily. 30 tablet 5  . diclofenac sodium (VOLTAREN) 1 % GEL Apply 1 application topically 4 (four) times daily as needed (pain).    [name] estradiol (CLIMARA - DOSED IN MG/24 HR) 0.025 mg/24hr patch PLACE 1 PATCH (0.025 MG TOTAL) ONTO THE SKIN ONCE A WEEK. 12 patch 4  . etodolac (LODINE) 500 MG tablet Take 1 tablet (500 mg total) by mouth daily. 30 tablet 6  . famotidine (PEPCID AC) 10 MG tablet Take 10 mg by mouth daily as needed for heartburn or indigestion.    . Menthol-Methyl Salicylate (MUSCLE RUB EX) Apply 1 application topically daily as needed (muscle pain).    [name] UNABLE TO FIND Hyland's Leg Cramps tablets: Take 1 tablet by mouth during the night as needed for  leg cramps    . zolpidem (AMBIEN) 10 MG tablet Take 1 tablet (10 mg total) by mouth at bedtime as needed. For sleep 30 tablet 1   No current facility-administered medications for this visit.    Allergies:   Codeine and Plavix [clopidogrel bisulfate]    Social History:  The patient  reports that she has never smoked. She has never used smokeless tobacco. She reports current alcohol use of about 7.0 standard drinks of alcohol per week. She reports that she does not use drugs.   Family History:  The patient's family history includes Bone cancer in her father; Breast cancer in her maternal aunt; Diabetes in her maternal aunt; Hypertension in her mother; Lung cancer in her father; Ovarian cancer in her sister.    ROS:  Please see the history of present illness.   Otherwise, review of systems are positive for none.   All other systems are reviewed and negative.     PHYSICAL EXAM: VS:  BP 132/84   Pulse 60   Temp 97.6 F (36.4 C)   Ht 5' 7.5" (1.715 m)   Wt 150 lb (68 kg)   SpO2 98%   BMI 23.15 kg/m  , BMI Body mass index is 23.15 kg/m. GEN: Well nourished, well developed, in no acute distress  HEENT: normal  Neck: no JVD, carotid bruits, or masses Cardiac: RRR; gr 2/6 systolic murmur RUSB, 1/6 diastolic murmur LSB. S2 reduced. No rubs or gallops. Respiratory:  clear to auscultation bilaterally, normal work of breathing GI: soft, nontender, nondistended, + BS MS: no deformity or atrophy. No edema and left leg is larger than right. Skin: warm and dry, no rash Neuro:  Strength and sensation are intact Psych: euthymic mood, full affect   EKG:  EKG is  ordered today.NSR rate 60. Right axis. NSIVCD. I have personally reviewed and interpreted this study.    Recent Labs: 11/09/2018: ALT 15; B Natriuretic Peptide 570.3 11/11/2018: Magnesium 1.9 11/12/2018: BUN 15; Creatinine, Ser 0.96; Hemoglobin 10.3; Platelets 121; Potassium 3.9; Sodium 138    Lipid Panel    Component Value Date/Time   CHOL 193 01/14/2018 0212   TRIG 55 01/14/2018 0212   HDL 85 01/14/2018 0212   CHOLHDL 2.3 01/14/2018 0212   VLDL 11 01/14/2018 0212   LDLCALC 97 01/14/2018 0212    Labs dated 10/12/18: cholesterol 231, triglycerides 86, HDL 83, LDL 131. Creatinine 1.12, potassium 5.8. ALT and CBC normal.   Wt Readings from Last 3 Encounters:  10/25/19 150 lb (68 kg)  04/26/19 155 lb (70.3 kg)  03/30/19 151 lb (68.5 kg)      Other studies Reviewed: Additional studies/ records that were reviewed today include:  Cardiac cath 10/23/18:  RIGHT/LEFT HEART CATH AND CORONARY ANGIOGRAPHY  Conclusion    Dist LAD lesion is 30% stenosed.  There is moderate left ventricular systolic dysfunction.  The left ventricular ejection fraction is 35-45% by visual estimate.  LV end diastolic pressure is normal.  There is moderate aortic valve stenosis.   1. Minimal  nonobstructive CAD 2. Moderate LV dysfunction. EF 35-40%. 3. At least moderate AS. Mean gradient 27 mm Hg. AVA may be underestimated due to decreased LV function 4. Normal LV filling pressures. 5. Normal right heart pressures.  Plan: will refer to Valvular heart team for consideration of TAVR.        Echo 04/05/19: IMPRESSIONS    1. The left ventricle has low normal systolic function, with an ejection fraction of 50-55%. The  cavity size was normal. Left ventricular diastolic Doppler parameters are consistent with impaired relaxation.  2. The right ventricle has normal systolic function. The cavity was normal.  3. Left atrial size was mildly dilated.  4. The tricuspid valve is grossly normal. Tricuspid valve regurgitation is mild-moderate.  5. A 26 an Edwards Edwards Sapien bioprosthetic aortic valve (TAVR) valve is present in the aortic position. Procedure Date: 11/10/18 Normal aortic valve prosthesis.  6. The aorta is normal in size and structure.  7. Definity used; Low normal LV function; mild diastolic dysfunction; s/p AVR with mean gradient of 12 mmHg and mild perivalvular AI; mild LAE; mild to moderate TR.     ASSESSMENT AND PLAN:  1.  Chronic systolic CHF. EF improved post TAVR to 50-55%. Prior cardiac cath showed fairly normal filling pressures, right heart pressures and cardiac output. I think her drop in EF was related to her AS. Plan repeat Echo in March. If EF is good will continue current therapy. If EF drops will need to consider resuming Entresto or ARB  2. CAD with nonobstructive disease.  3. Severe low gradient  aortic stenosis. S/p TAVR in March 2020. Follow up in valve clinic next month. She does have mild AI murmur 4. Complete heart block s/p PPM- stable. 5. Osteoarthritis.    Signed, Arnol Mcgibbon [name], MD  10/25/2019 2:06 PM    [facility] [address] [place], [place], [address] Phone [phone], Fax [phone]

## 2019-10-25 ENCOUNTER — Ambulatory Visit: Payer: Medicare Other | Admitting: Cardiology

## 2019-10-25 ENCOUNTER — Encounter: Payer: Self-pay | Admitting: Cardiology

## 2019-10-25 ENCOUNTER — Other Ambulatory Visit: Payer: Self-pay

## 2019-10-25 VITALS — BP 132/84 | HR 60 | Temp 97.6°F | Ht 67.5 in | Wt 150.0 lb

## 2019-10-25 DIAGNOSIS — I5042 Chronic combined systolic (congestive) and diastolic (congestive) heart failure: Secondary | ICD-10-CM

## 2019-10-25 DIAGNOSIS — I350 Nonrheumatic aortic (valve) stenosis: Secondary | ICD-10-CM | POA: Diagnosis not present

## 2019-10-25 DIAGNOSIS — Z952 Presence of prosthetic heart valve: Secondary | ICD-10-CM | POA: Diagnosis not present

## 2019-10-25 DIAGNOSIS — I442 Atrioventricular block, complete: Secondary | ICD-10-CM | POA: Diagnosis not present

## 2019-10-25 DIAGNOSIS — I4510 Unspecified right bundle-branch block: Secondary | ICD-10-CM

## 2019-10-25 MED ORDER — ETODOLAC 500 MG PO TABS: 500.0000 mg | ORAL_TABLET | Freq: Every day | ORAL | 6 refills | Status: DC

## 2019-10-26 ENCOUNTER — Other Ambulatory Visit: Payer: Self-pay | Admitting: Cardiology

## 2019-11-03 DIAGNOSIS — E7800 Pure hypercholesterolemia, unspecified: Secondary | ICD-10-CM | POA: Diagnosis not present

## 2019-11-03 DIAGNOSIS — Z952 Presence of prosthetic heart valve: Secondary | ICD-10-CM | POA: Diagnosis not present

## 2019-11-03 DIAGNOSIS — N959 Unspecified menopausal and perimenopausal disorder: Secondary | ICD-10-CM | POA: Diagnosis not present

## 2019-11-03 DIAGNOSIS — Z950 Presence of cardiac pacemaker: Secondary | ICD-10-CM | POA: Diagnosis not present

## 2019-11-03 DIAGNOSIS — Z1211 Encounter for screening for malignant neoplasm of colon: Secondary | ICD-10-CM | POA: Diagnosis not present

## 2019-11-03 DIAGNOSIS — G4700 Insomnia, unspecified: Secondary | ICD-10-CM | POA: Diagnosis not present

## 2019-11-03 DIAGNOSIS — M179 Osteoarthritis of knee, unspecified: Secondary | ICD-10-CM | POA: Diagnosis not present

## 2019-11-03 DIAGNOSIS — Z0000 Encounter for general adult medical examination without abnormal findings: Secondary | ICD-10-CM | POA: Diagnosis not present

## 2019-11-03 DIAGNOSIS — Z79899 Other long term (current) drug therapy: Secondary | ICD-10-CM | POA: Diagnosis not present

## 2019-11-09 ENCOUNTER — Encounter: Payer: Self-pay | Admitting: Sports Medicine

## 2019-11-09 ENCOUNTER — Other Ambulatory Visit: Payer: Self-pay

## 2019-11-09 ENCOUNTER — Ambulatory Visit: Payer: PPO | Admitting: Sports Medicine

## 2019-11-09 VITALS — BP 136/82 | Ht 66.5 in | Wt 152.0 lb

## 2019-11-09 DIAGNOSIS — Z1211 Encounter for screening for malignant neoplasm of colon: Secondary | ICD-10-CM | POA: Diagnosis not present

## 2019-11-09 DIAGNOSIS — M2141 Flat foot [pes planus] (acquired), right foot: Secondary | ICD-10-CM

## 2019-11-09 DIAGNOSIS — S86812A Strain of other muscle(s) and tendon(s) at lower leg level, left leg, initial encounter: Secondary | ICD-10-CM | POA: Diagnosis not present

## 2019-11-09 DIAGNOSIS — S86819A Strain of other muscle(s) and tendon(s) at lower leg level, unspecified leg, initial encounter: Secondary | ICD-10-CM | POA: Insufficient documentation

## 2019-11-09 DIAGNOSIS — M2142 Flat foot [pes planus] (acquired), left foot: Secondary | ICD-10-CM

## 2019-11-09 NOTE — Patient Instructions (Signed)
It was great to see you!  Our plans for today:  - Use the orthotics you received today with activity. - You can continue to increase your activity slowly as tolerated. - Continue the compression sleeve with activity - Continue to use ibuprofen as needed for pain. - Follow up as needed.   Take care and seek immediate care sooner if you develop any concerns.

## 2019-11-09 NOTE — Assessment & Plan Note (Addendum)
History and exam most consistent with L calf strain. Continue stretching prior to activity and continuation of compression sleeve and prn ibuprofen. Also provided updated custom orthotics for additional support given hammer toe, pes planus. Follow up as needed.

## 2019-11-09 NOTE — Progress Notes (Addendum)
    SUBJECTIVE:   CHIEF COMPLAINT: L calf pain  HPI:   Presents for L calf pain noticed while playing tennis last Tuesday. States pain started behind L knee with radiation into her calf with locking and cramping sensation. Bought heel lifts on recommendation from a friend with improvement in pain. She has also been using her older orthotics and Body Helix compression sleeve more as well as stretching prior to activity. Going up a hill makes pain worse though walking on flat ground is ok.   PERTINENT  PMH / PSH: severe aortic stenosis s/p TAVR, HLD, RBBB, combined systolic and diastolic CHF, mild CAD, mitral regurgitation, knee OA, hammer toe, plantar fasciitis, mild scoliosis, bilateral knee OA  OBJECTIVE:   BP 136/82   Ht 5' 6.5" (1.689 m)   Wt 152 lb (68.9 kg)   BMI 24.17 kg/m   Gen: well appearing, in NAD MSK: L knee/calf: Inspection: no swelling, erythema, no overlying skin changes Palpation: nonTTP along bilateral joint lines, patellar compression, compression of L calf, over insertion of medial and lateral gastroc.  ROM: full AROM Strength: 5/5 with flexion, extension, dorsiflexion and plantar flexion Stability: no joint laxity Special Tests: - negative McMurray - negative Thessaly Neurovascular: intact   ASSESSMENT/PLAN:   Strain of calf muscle History and exam most consistent with L calf strain. Continue stretching prior to activity and continuation of compression sleeve and prn ibuprofen. Also provided updated custom orthotics for additional support given hammer toe, pes planus. Follow up as needed.    Rory Percy, DO PGY-3, [facility] 11/09/2019 4:15 PM   Patient seen and evaluated with the resident.  I agree with the above plan of care.  We created some new custom orthotics for [place] today.  Her current orthotics are several years old.  Her old orthotics had bilateral metatarsal pads as well as bilateral first ray posts and additional padding at  the metatarsal area with blue Poron.  After creating her new custom orthotics we decided not to add the additional padding or posting.  We did initially try bilateral metatarsal pads but we were unable to get the left metatarsal pad in a comfortable position.  Therefore, we removed it and I gave it to [place] with instructions how to replace it if she begins to experience pain in the metatarsal head area.  I have also given her some 3/16 inch heel lifts to wear in her shoes until her calf strain resolves.  As long as she is able to return to her previous level of activity she will follow-up with [place] as needed.  Patient was fitted for a : standard, cushioned, semi-rigid orthotic. The orthotic was heated and afterward the patient stood on the orthotic blank positioned on the orthotic stand. The patient was positioned in subtalar neutral position and 10 degrees of ankle dorsiflexion in a weight bearing stance. After completion of molding, a stable base was applied to the orthotic blank. The blank was ground to a stable position for weight bearing. Size: 10 (shaved down to fit in her shoes) Base: Blue EVA Posting: none (see above) Additional orthotic padding: MT pad, right

## 2019-11-10 ENCOUNTER — Encounter: Payer: Self-pay | Admitting: Sports Medicine

## 2019-11-22 NOTE — Progress Notes (Signed)
[facility] [place]                                       Cardiology Office Note    Date:  11/25/2019   ID:  [name], DOB 1945/12/01, MRN [id number]  PCP:  [name], MD  Cardiologist: Dr. [name] / Dr. [name] & Dr. Roxy Manns (TAVR)  CC: 1 year s/p TAVR  History of Present Illness:  Brittany Shaffer is a 74 y.o. female with a history of  bicuspid Aov, mitral regurgitation, NICM, chronic combined S/D CHF, RBBB, HLD, degenerative arthritis and severe AS s/p TAVR (11/10/2018) c/b CHB s/p CRT-P who presents to clinic for 1 year follow up.   She underwent successful TAVR with a59mm Edwards Sapien 3 THV via the TF approach on 11/10/18. Post operative echoshowed EF 40-45%, normally functioning TAVR with mean gradient of 15.20mm Hg and mild PVL. Hospital course was complicated by intraprocedural CHB. She underwent successful CRT-P by Dr. Lovena Le on 11/11/18. 1 month echo was pushed out given the covid 19 pandemic. This was complted in 04/2019 and showed low normal EF (50-55%) with normally functioning TAVR with mean gradient 11.8 mm Hg and mild PVL. Mild-mod TR.  She had covid in 08/2019. She was treated with the mAB infusion.   Today she presents to clinic for follow up. She is doing much better. Pulled a hamstring which is keeping her from playing tennis.  No CP or SOB. No LE edema, orthopnea or PND. No dizziness or syncope. No blood in stool or urine. No palpitations. Has has some aching in her teeth since covid.    Past Medical History:  Diagnosis Date  . Abnormality of gait 10/20/2014  . Arthritis    "was in my hips; still in my knees" (01/13/2018)  . Bicuspid aortic valve   . Chronic combined systolic and diastolic heart failure ([place])   . DJD (degenerative joint disease), ankle and foot 10/20/2014  . HAMMER TOE, ACQUIRED 07/28/2007   Qualifier: Diagnosis of  By: Lorelei Pont MD, Frederico Hamman    . HLD (hyperlipidemia) 01/14/2018  . Metatarsalgia of  left foot 10/20/2014  . Mild coronary artery disease 01/14/2018   LAD with 50% stenosis on [facility] 12/2017  . Mitral regurgitation   . OA (osteoarthritis) of hip 03/24/2013  . PLANTAR FASCIITIS 07/28/2007   Qualifier: Diagnosis of  By: Lorelei Pont MD, Frederico Hamman    . RBBB   . S/P TAVR (transcatheter aortic valve replacement) 11/10/2018   26 mm Edwards Sapien 3 transcatheter heart valve placed via percutaneous right transfemoral approach   . Severe aortic stenosis   . Varicose veins of bilateral lower extremities with other complications [id number]    Past Surgical History:  Procedure Laterality Date  . ABDOMINAL HYSTERECTOMY  03/03/2012   Procedure: HYSTERECTOMY ABDOMINAL;  Surgeon: Alvino Chapel, MD;  [facility];  Service: Gynecology;  Laterality: N/A;  . BIV PACEMAKER INSERTION CRT-P N/A 11/11/2018   Procedure: BIV PACEMAKER INSERTION CRT-P;  Surgeon: Evans Lance, MD;  Location: [place] [facility];  Service: Cardiovascular;  Laterality: N/A;  . BLEPHAROPLASTY Bilateral   . JOINT REPLACEMENT    . 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 RIGHT/LEFT HEART CATH AND CORONARY  ANGIOGRAPHY N/A 01/14/2018   Procedure: RIGHT/LEFT HEART CATH AND CORONARY ANGIOGRAPHY;  Surgeon: Wellington Hampshire, MD;  Location: [place] [facility];  Service: Cardiovascular;  Laterality: N/A;  . RIGHT/LEFT HEART CATH AND CORONARY ANGIOGRAPHY N/A 10/23/2018   Procedure: RIGHT/LEFT HEART CATH AND CORONARY ANGIOGRAPHY;  Surgeon: [name], [name], MD;  Location: [place] [facility];  Service: Cardiovascular;  Laterality: N/A;  . SALPINGOOPHORECTOMY  03/03/2012   Procedure: SALPINGO OOPHERECTOMY;  Surgeon: Alvino Chapel, MD;  [facility];  Service: Gynecology;  Laterality: Bilateral;  . TEE WITHOUT CARDIOVERSION N/A 11/10/2018   Procedure: TRANSESOPHAGEAL  ECHOCARDIOGRAM (TEE);  Surgeon: Sherren Mocha, MD;  Location: [place] [facility];  Service: Open Heart Surgery;  Laterality: N/A;  . TONSILLECTOMY    . TOTAL HIP ARTHROPLASTY Left 03/24/2013   Procedure: LEFT TOTAL HIP ARTHROPLASTY ANTERIOR APPROACH;  Surgeon: [name], MD;  [facility];  Service: Orthopedics;  Laterality: Left;  . TOTAL HIP ARTHROPLASTY Right 12/18/2016   Procedure: RIGHT TOTAL HIP ARTHROPLASTY ANTERIOR APPROACH;  Surgeon: Gaynelle Arabian, MD;  [facility];  Service: Orthopedics;  Laterality: Right;  . TRANSCATHETER AORTIC VALVE REPLACEMENT, TRANSFEMORAL N/A 11/10/2018   Procedure: TRANSCATHETER AORTIC VALVE REPLACEMENT, TRANSFEMORAL;  Surgeon: Sherren Mocha, MD;  Location: [place] [facility];  Service: Open Heart Surgery;  Laterality: N/A;  . TUBAL LIGATION      Current Medications: Outpatient Medications Prior to Visit  Medication Sig Dispense Refill  . acetaminophen (TYLENOL) 500 MG tablet Take 500-1,000 mg by mouth every 6 (six) hours as needed for moderate pain.    [name] amoxicillin (AMOXIL) 500 MG tablet Take 4 tablets (2,000 mg total) by mouth as directed. 1 hour prior to dental work including cleanings 12 tablet 12  . ASPIRIN LOW DOSE 81 MG EC tablet TAKE 1 TABLET BY MOUTH EVERY DAY 90 tablet 3  . diclofenac sodium (VOLTAREN) 1 % GEL Apply 1 application topically 4 (four) times daily as needed (pain).    [name] estradiol (CLIMARA - DOSED IN MG/24 HR) 0.025 mg/24hr patch PLACE 1 PATCH (0.025 MG TOTAL) ONTO THE SKIN ONCE A WEEK. 12 patch 4  . etodolac (LODINE) 500 MG tablet Take 1 tablet (500 mg total) by mouth daily. 30 tablet 6  . famotidine (PEPCID AC) 10 MG tablet Take 10 mg by mouth daily as needed for heartburn or indigestion.    . Menthol-Methyl Salicylate (MUSCLE RUB EX) Apply 1 application topically daily as needed (muscle pain).    [name] UNABLE TO FIND Hyland's Leg Cramps tablets: Take 1 tablet by mouth during the night as needed for leg cramps    .  zolpidem (AMBIEN) 10 MG tablet Take 1 tablet (10 mg total) by mouth at bedtime as needed. For sleep 30 tablet 1   No facility-administered medications prior to visit.     Allergies:   Codeine and Plavix [clopidogrel bisulfate]   Social History   Socioeconomic History  . Marital status: Widowed    Spouse name: Not on file  . Number of children: Not on file  . Years of education: Not on file  . Highest education level: Not on file  Occupational History  . Not on file  Tobacco Use  . Smoking status: Never Smoker  . Smokeless tobacco: Never Used  Substance and Sexual Activity  . Alcohol use: Yes    Alcohol/week: 7.0 standard drinks    Types: 7 Standard drinks or equivalent per week    Comment: 1 / PER  DAY  . Drug use: No  . Sexual activity: Not Currently    Birth control/protection: Surgical    Comment: 1st intercourse 74 yo-Fewer than 5 partners  Other Topics Concern  . Not on file  Social History Narrative   Lives locally.  Retired. Active - walks frequently and plays tennis regularly.   Social Determinants of Health   Financial Resource Strain:   . Difficulty of Paying Living Expenses:   Food Insecurity:   . Worried About Charity fundraiser in the Last Year:   . Arboriculturist in the Last Year:   Transportation Needs:   . Film/video editor (Medical):   [name] Lack of Transportation (Non-Medical):   Physical Activity:   . Days of Exercise per Week:   . Minutes of Exercise per Session:   Stress:   . Feeling of Stress :   Social Connections:   . Frequency of Communication with Friends and Family:   . Frequency of Social Gatherings with Friends and Family:   . Attends Religious Services:   . Active Member of Clubs or Organizations:   . Attends Archivist Meetings:   [name] Marital Status:      Family History:  The patient's family history includes Bone cancer in her father; Breast cancer in her maternal aunt; Diabetes in her maternal aunt; Hypertension in  her mother; Lung cancer in her father; Ovarian cancer in her sister.     ROS:   Please see the history of present illness.    ROS All other systems reviewed and are negative.   PHYSICAL EXAM:   VS:  BP 130/72   Pulse (!) 57   Ht 5\' 7"  (1.702 m)   Wt 158 lb (71.7 kg)   SpO2 99%   BMI 24.75 kg/m    GEN: Well nourished, well developed, in no acute distress HEENT: normal Neck: no JVD or masses Cardiac: RRR; no murmurs, rubs, or gallops,no edema  Respiratory:  clear to auscultation bilaterally, normal work of breathing GI: soft, nontender, nondistended, + BS MS: no deformity or atrophy Skin: warm and dry, no rash Neuro:  Alert and Oriented x 3, Strength and sensation are intact Psych: euthymic mood, full affect   Wt Readings from Last 3 Encounters:  11/24/19 158 lb (71.7 kg)  11/09/19 152 lb (68.9 kg)  10/25/19 150 lb (68 kg)      Studies/Labs Reviewed:   EKG:  EKG is NOT ordered today.    Recent Labs: No results found for requested labs within last 8760 hours.   Lipid Panel    Component Value Date/Time   CHOL 193 01/14/2018 0212   TRIG 55 01/14/2018 0212   HDL 85 01/14/2018 0212   CHOLHDL 2.3 01/14/2018 0212   VLDL 11 01/14/2018 0212   LDLCALC 97 01/14/2018 0212    Additional studies/ records that were reviewed today include:  TAVR OPERATIVE NOTE   Date of Procedure:11/10/2018  Preoperative Diagnosis:Severe Aortic Stenosis   Postoperative Diagnosis:Same   Procedure:   Transcatheter Aortic Valve Replacement - PercutaneousRightTransfemoral Approach Edwards Sapien 3 THV (size 46mm, model # 9600TFX, serial # [id number])  Co-Surgeons:Clarence H. Roxy Manns, MD and Sherren Mocha, MD  Anesthesiologist:Ryan Roanna Banning, MD  Dala Dock, MD  Pre-operative Echo Findings: ? Severe aortic stenosis ? Moderateleft ventricular  systolicdysfunction  Post-operative Echo Findings: ? Mildparavalvular leak ? Unchangedleft ventricular systolic function  _______________   Echo 11/11/18: IMPRESSIONS 1. A 26 an Edwards Edwards Sapien bioprosthetic aortic valve (TAVR) valve  is present in the aortic position. Procedure Date: 11/10/2018 Normal aortic valve prosthesis. Echo findings are consistent with mild perivalvular regurgitation at former RCC of the aortic prosthesis. 2. Aortic valve regurgitation is mild by color flow Doppler. 3. The left ventricle has mild-moderately reduced systolic function, with an ejection fraction of 40-45%. The cavity size was normal. There is moderate concentric left ventricular hypertrophy. Left ventricular diastolic Doppler parameters are consistent with pseudonormal. Left ventricular diffuse hypokinesis. 4. The right ventricle has normal systolc function. The cavity was normal. There is no increase in right ventricular wall thickness. 5. The mitral valve is degenerative. Mild thickening of the mitral valve leaflet. Mild calcification of the mitral valve leaflet. 6. Left atrial size was moderately dilated. 7. Right atrial size was mildly dilated.  _______________  Echo 04/05/19 IMPRESSIONS  1. The left ventricle has low normal systolic function, with an ejection  fraction of 50-55%. The cavity size was normal. Left ventricular diastolic  Doppler parameters are consistent with impaired relaxation.  2. The right ventricle has normal systolic function. The cavity was  normal.  3. Left atrial size was mildly dilated.  4. The tricuspid valve is grossly normal. Tricuspid valve regurgitation  is mild-moderate.  5. A 26 an Edwards Edwards Sapien bioprosthetic aortic valve (TAVR) valve  is present in the aortic position. Procedure Date: 11/10/18 Normal aortic  valve prosthesis.  6. The aorta is normal in size and structure.  7. Definity used; Low normal LV function; mild  diastolic dysfunction; s/p  AVR with mean gradient of 12 mmHg and mild perivalvular AI; mild LAE; mild  to moderate TR.   ______________  Echo 11/24/19 IMPRESSIONS  1. Left ventricular ejection fraction, by estimation, is 45 to 50%. The  left ventricle has mildly decreased function. The left ventricle  demonstrates regional wall motion abnormalities. Basal anterolateral,  basal inferolateral, and basal inferior severe  hypokinesis. Left ventricular diastolic parameters are consistent with  Grade II diastolic dysfunction (pseudonormalization).  2. Right ventricular systolic function is normal. The right ventricular  size is mildly enlarged. There is normal pulmonary artery systolic  pressure. The estimated right ventricular systolic pressure is [id number] mmHg.  3. 26 mm Edwards Sapien bioprosthetic aortic valve s/p TAVR. Mild,  eccentric peri-valvular regurgitation. Mean gradient 15 mmHg with  calculated AVA 1.04 cm^2.  4. The mitral valve is normal in structure. Trivial mitral valve  regurgitation. No evidence of mitral stenosis.  5. Tricuspid valve regurgitation is mild to moderate.  6. Right atrial size was mildly dilated.  7. Left atrial size was moderately dilated.  8. The inferior vena cava is normal in size with greater than 50%  respiratory variability, suggesting right atrial pressure of 3 mmHg.   ASSESSMENT & PLAN:   Severe AS s/p TAVR: echo today shows EF 45-50%, normally functioning TAVR with a mean gradient of 15 mm Hg and mild eccentric PVL. She has NYHA class I symptoms. She has amoxicillin for SBE prophylaxis. Continue on a baby aspirin indefinitely. She will continue routine follow up with Dr. [name]    Medication Adjustments/Labs and Tests Ordered: Current medicines are reviewed at length with the patient today.  Concerns regarding medicines are outlined above.  Medication changes, Labs and Tests ordered today are listed in the Patient Instructions  below. Patient Instructions  Medication Instructions:  Your provider recommends that you continue on your current medications as directed. Please refer to the Current Medication list given to you today.     Follow-Up: Please follow-up  with Dr. [name] as planned later this summer!    Mable Fill, PA-C  11/25/2019 9:54 AM    [facility], [place], [place]  [address] Phone: [phone]; Fax: [phone]

## 2019-11-24 ENCOUNTER — Other Ambulatory Visit: Payer: Self-pay

## 2019-11-24 ENCOUNTER — Ambulatory Visit (HOSPITAL_COMMUNITY): Payer: PPO | Attending: Cardiology

## 2019-11-24 ENCOUNTER — Other Ambulatory Visit (HOSPITAL_COMMUNITY): Payer: PPO

## 2019-11-24 ENCOUNTER — Encounter: Payer: Self-pay | Admitting: Physician Assistant

## 2019-11-24 ENCOUNTER — Ambulatory Visit: Payer: PPO | Admitting: Physician Assistant

## 2019-11-24 VITALS — BP 130/72 | HR 57 | Ht 67.0 in | Wt 158.0 lb

## 2019-11-24 DIAGNOSIS — Z952 Presence of prosthetic heart valve: Secondary | ICD-10-CM | POA: Diagnosis not present

## 2019-11-24 NOTE — Patient Instructions (Signed)
Medication Instructions:  Your provider recommends that you continue on your current medications as directed. Please refer to the Current Medication list given to you today.     Follow-Up: Please follow-up with Dr. [name] as planned later this summer!

## 2019-11-30 ENCOUNTER — Ambulatory Visit (INDEPENDENT_AMBULATORY_CARE_PROVIDER_SITE_OTHER): Payer: PPO | Admitting: *Deleted

## 2019-11-30 DIAGNOSIS — Z950 Presence of cardiac pacemaker: Secondary | ICD-10-CM | POA: Diagnosis not present

## 2019-11-30 LAB — CUP PACEART REMOTE DEVICE CHECK
Battery Remaining Longevity: 132 mo
Battery Remaining Percentage: 100 %
Brady Statistic RA Percent Paced: 1 %
Brady Statistic RV Percent Paced: 12 %
Date Time Interrogation Session: 20210330012200
Implantable Lead Implant Date: 20200311
Implantable Lead Implant Date: 20200311
Implantable Lead Implant Date: 20200311
Implantable Lead Location: 753858
Implantable Lead Location: 753859
Implantable Lead Location: 753860
Implantable Lead Model: 4674
Implantable Lead Model: 7741
Implantable Lead Model: 7742
Implantable Lead Serial Number: 1022279
Implantable Lead Serial Number: 1098426
Implantable Lead Serial Number: 830411
Implantable Pulse Generator Implant Date: 20200311
Lead Channel Impedance Value: 1147 Ohm
Lead Channel Impedance Value: 721 Ohm
Lead Channel Impedance Value: 775 Ohm
Lead Channel Pacing Threshold Amplitude: 0.5 V
Lead Channel Pacing Threshold Amplitude: 0.9 V
Lead Channel Pacing Threshold Amplitude: 1.2 V
Lead Channel Pacing Threshold Pulse Width: 0.4 ms
Lead Channel Pacing Threshold Pulse Width: 0.4 ms
Lead Channel Pacing Threshold Pulse Width: 0.4 ms
Lead Channel Setting Pacing Amplitude: 2 V
Lead Channel Setting Pacing Amplitude: 2.5 V
Lead Channel Setting Pacing Amplitude: 2.5 V
Lead Channel Setting Pacing Pulse Width: 0.4 ms
Lead Channel Setting Pacing Pulse Width: 0.4 ms
Lead Channel Setting Sensing Sensitivity: 2.5 mV
Lead Channel Setting Sensing Sensitivity: 2.5 mV
Pulse Gen Serial Number: 747113

## 2019-11-30 NOTE — Progress Notes (Signed)
PPM remote 

## 2020-02-15 DIAGNOSIS — D1801 Hemangioma of skin and subcutaneous tissue: Secondary | ICD-10-CM | POA: Diagnosis not present

## 2020-02-15 DIAGNOSIS — L570 Actinic keratosis: Secondary | ICD-10-CM | POA: Diagnosis not present

## 2020-02-15 DIAGNOSIS — L821 Other seborrheic keratosis: Secondary | ICD-10-CM | POA: Diagnosis not present

## 2020-02-15 DIAGNOSIS — D2271 Melanocytic nevi of right lower limb, including hip: Secondary | ICD-10-CM | POA: Diagnosis not present

## 2020-02-15 DIAGNOSIS — L820 Inflamed seborrheic keratosis: Secondary | ICD-10-CM | POA: Diagnosis not present

## 2020-02-15 DIAGNOSIS — D2272 Melanocytic nevi of left lower limb, including hip: Secondary | ICD-10-CM | POA: Diagnosis not present

## 2020-02-29 ENCOUNTER — Ambulatory Visit (INDEPENDENT_AMBULATORY_CARE_PROVIDER_SITE_OTHER): Payer: PPO | Admitting: *Deleted

## 2020-02-29 DIAGNOSIS — I442 Atrioventricular block, complete: Secondary | ICD-10-CM

## 2020-03-02 LAB — CUP PACEART REMOTE DEVICE CHECK
Battery Remaining Longevity: 132 mo
Battery Remaining Percentage: 100 %
Brady Statistic RA Percent Paced: 0 %
Brady Statistic RV Percent Paced: 11 %
Date Time Interrogation Session: 20210630145200
Implantable Lead Implant Date: 20200311
Implantable Lead Implant Date: 20200311
Implantable Lead Implant Date: 20200311
Implantable Lead Location: 753858
Implantable Lead Location: 753859
Implantable Lead Location: 753860
Implantable Lead Model: 4674
Implantable Lead Model: 7741
Implantable Lead Model: 7742
Implantable Lead Serial Number: 1022279
Implantable Lead Serial Number: 1098426
Implantable Lead Serial Number: 830411
Implantable Pulse Generator Implant Date: 20200311
Lead Channel Impedance Value: 1089 Ohm
Lead Channel Impedance Value: 689 Ohm
Lead Channel Impedance Value: 825 Ohm
Lead Channel Pacing Threshold Amplitude: 0.4 V
Lead Channel Pacing Threshold Amplitude: 0.9 V
Lead Channel Pacing Threshold Amplitude: 1.2 V
Lead Channel Pacing Threshold Pulse Width: 0.4 ms
Lead Channel Pacing Threshold Pulse Width: 0.4 ms
Lead Channel Pacing Threshold Pulse Width: 0.4 ms
Lead Channel Setting Pacing Amplitude: 2 V
Lead Channel Setting Pacing Amplitude: 2.5 V
Lead Channel Setting Pacing Amplitude: 2.5 V
Lead Channel Setting Pacing Pulse Width: 0.4 ms
Lead Channel Setting Pacing Pulse Width: 0.4 ms
Lead Channel Setting Sensing Sensitivity: 2.5 mV
Lead Channel Setting Sensing Sensitivity: 2.5 mV
Pulse Gen Serial Number: 747113

## 2020-03-03 NOTE — Progress Notes (Signed)
Remote pacemaker transmission.   

## 2020-03-23 DIAGNOSIS — H43811 Vitreous degeneration, right eye: Secondary | ICD-10-CM | POA: Diagnosis not present

## 2020-03-23 DIAGNOSIS — H25813 Combined forms of age-related cataract, bilateral: Secondary | ICD-10-CM | POA: Diagnosis not present

## 2020-03-23 DIAGNOSIS — H40013 Open angle with borderline findings, low risk, bilateral: Secondary | ICD-10-CM | POA: Diagnosis not present

## 2020-04-21 ENCOUNTER — Other Ambulatory Visit: Payer: Self-pay

## 2020-04-21 ENCOUNTER — Encounter: Payer: Self-pay | Admitting: Internal Medicine

## 2020-04-21 ENCOUNTER — Ambulatory Visit: Payer: PPO | Admitting: Internal Medicine

## 2020-04-21 DIAGNOSIS — Z950 Presence of cardiac pacemaker: Secondary | ICD-10-CM

## 2020-04-21 DIAGNOSIS — I442 Atrioventricular block, complete: Secondary | ICD-10-CM | POA: Diagnosis not present

## 2020-04-21 DIAGNOSIS — I428 Other cardiomyopathies: Secondary | ICD-10-CM | POA: Diagnosis not present

## 2020-04-21 NOTE — Progress Notes (Signed)
HPI Brittany Shaffer returns today for followup. She is a pleasant 74 yo woman with AS, s/p TAVR, complicated by CHB. She has been stable in the interim. She is playing tennis and golf without limit. Allergies  Allergen Reactions  . Codeine Other (See Comments)    Hallucinations  . Plavix [Clopidogrel Bisulfate] Rash     Current Outpatient Medications  Medication Sig Dispense Refill  . acetaminophen (TYLENOL) 500 MG tablet Take 500-1,000 mg by mouth every 6 (six) hours as needed for moderate pain.    [name] amoxicillin (AMOXIL) 500 MG tablet Take 4 tablets (2,000 mg total) by mouth as directed. 1 hour prior to dental work including cleanings 12 tablet 12  . ASPIRIN LOW DOSE 81 MG EC tablet TAKE 1 TABLET BY MOUTH EVERY DAY 90 tablet 3  . diclofenac sodium (VOLTAREN) 1 % GEL Apply 1 application topically 4 (four) times daily as needed (pain).    [name] estradiol (CLIMARA - DOSED IN MG/24 HR) 0.025 mg/24hr patch PLACE 1 PATCH (0.025 MG TOTAL) ONTO THE SKIN ONCE A WEEK. 12 patch 4  . etodolac (LODINE) 500 MG tablet Take 1 tablet (500 mg total) by mouth daily. 30 tablet 6  . famotidine (PEPCID AC) 10 MG tablet Take 10 mg by mouth daily as needed for heartburn or indigestion.    . Menthol-Methyl Salicylate (MUSCLE RUB EX) Apply 1 application topically daily as needed (muscle pain).    [name] UNABLE TO FIND Hyland's Leg Cramps tablets: Take 1 tablet by mouth during the night as needed for leg cramps    . zolpidem (AMBIEN) 10 MG tablet Take 1 tablet (10 mg total) by mouth at bedtime as needed. For sleep 30 tablet 1   No current facility-administered medications for this visit.     Past Medical History:  Diagnosis Date  . Abnormality of gait 10/20/2014  . Arthritis    "was in my hips; still in my knees" (01/13/2018)  . Bicuspid aortic valve   . Chronic combined systolic and diastolic heart failure ([place])   . DJD (degenerative joint disease), ankle and foot 10/20/2014  . HAMMER TOE, ACQUIRED 07/28/2007    Qualifier: Diagnosis of  By: Lorelei Pont MD, Frederico Hamman    . HLD (hyperlipidemia) 01/14/2018  . Metatarsalgia of left foot 10/20/2014  . Mild coronary artery disease 01/14/2018   LAD with 50% stenosis on [facility] 12/2017  . Mitral regurgitation   . OA (osteoarthritis) of hip 03/24/2013  . PLANTAR FASCIITIS 07/28/2007   Qualifier: Diagnosis of  By: Lorelei Pont MD, Frederico Hamman    . RBBB   . S/P TAVR (transcatheter aortic valve replacement) 11/10/2018   26 mm Edwards Sapien 3 transcatheter heart valve placed via percutaneous right transfemoral approach   . Severe aortic stenosis   . Varicose veins of bilateral lower extremities with other complications 01/03/2705    ROS:   All systems reviewed and negative except as noted in the HPI.   Past Surgical History:  Procedure Laterality Date  . ABDOMINAL HYSTERECTOMY  03/03/2012   Procedure: HYSTERECTOMY ABDOMINAL;  Surgeon: Alvino Chapel, MD;  [facility];  Service: Gynecology;  Laterality: N/A;  . BIV PACEMAKER INSERTION CRT-P N/A 11/11/2018   Procedure: BIV PACEMAKER INSERTION CRT-P;  Surgeon: Evans Lance, MD;  Location: [place] [facility];  Service: Cardiovascular;  Laterality: N/A;  . BLEPHAROPLASTY Bilateral   . JOINT REPLACEMENT    . 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 RIGHT/LEFT HEART CATH AND CORONARY ANGIOGRAPHY N/A 01/14/2018   Procedure: RIGHT/LEFT HEART CATH AND CORONARY ANGIOGRAPHY;  Surgeon: Wellington Hampshire, MD;  Location: [place] [facility];  Service: Cardiovascular;  Laterality: N/A;  . RIGHT/LEFT HEART CATH AND CORONARY ANGIOGRAPHY N/A 10/23/2018   Procedure: RIGHT/LEFT HEART CATH AND CORONARY ANGIOGRAPHY;  Surgeon: [name], [name], MD;  Location: [place] [facility];  Service: Cardiovascular;  Laterality: N/A;  . SALPINGOOPHORECTOMY  03/03/2012   Procedure: SALPINGO OOPHERECTOMY;   Surgeon: Alvino Chapel, MD;  [facility];  Service: Gynecology;  Laterality: Bilateral;  . TEE WITHOUT CARDIOVERSION N/A 11/10/2018   Procedure: TRANSESOPHAGEAL ECHOCARDIOGRAM (TEE);  Surgeon: Sherren Mocha, MD;  Location: [place] [facility];  Service: Open Heart Surgery;  Laterality: N/A;  . TONSILLECTOMY    . TOTAL HIP ARTHROPLASTY Left 03/24/2013   Procedure: LEFT TOTAL HIP ARTHROPLASTY ANTERIOR APPROACH;  Surgeon: [name], MD;  [facility];  Service: Orthopedics;  Laterality: Left;  . TOTAL HIP ARTHROPLASTY Right 12/18/2016   Procedure: RIGHT TOTAL HIP ARTHROPLASTY ANTERIOR APPROACH;  Surgeon: Gaynelle Arabian, MD;  [facility];  Service: Orthopedics;  Laterality: Right;  . TRANSCATHETER AORTIC VALVE REPLACEMENT, TRANSFEMORAL N/A 11/10/2018   Procedure: TRANSCATHETER AORTIC VALVE REPLACEMENT, TRANSFEMORAL;  Surgeon: Sherren Mocha, MD;  Location: [place] [facility];  Service: Open Heart Surgery;  Laterality: N/A;  . TUBAL LIGATION       Family History  Problem Relation Age of Onset  . Hypertension Mother   . Bone cancer Father   . Lung cancer Father   . Ovarian cancer Sister   . Diabetes Maternal Aunt   . Breast cancer Maternal Aunt        Age 71's     Social History   Socioeconomic History  . Marital status: Widowed    Spouse name: Not on file  . Number of children: Not on file  . Years of education: Not on file  . Highest education level: Not on file  Occupational History  . Not on file  Tobacco Use  . Smoking status: Never Smoker  . Smokeless tobacco: Never Used  Vaping Use  . Vaping Use: Never used  Substance and Sexual Activity  . Alcohol use: Yes    Alcohol/week: 7.0 standard drinks    Types: 7 Standard drinks or equivalent per week    Comment: 1 / PER DAY  . Drug use: No  . Sexual activity: Not Currently    Birth control/protection: Surgical    Comment: 1st intercourse 74 yo-Fewer than 5 partners  Other Topics Concern  . Not  on file  Social History Narrative   Lives locally.  Retired. Active - walks frequently and plays tennis regularly.   Social Determinants of Health   Financial Resource Strain:   . Difficulty of Paying Living Expenses: Not on file  Food Insecurity:   . Worried About Charity fundraiser in the Last Year: Not on file  . Ran Out of Food in the Last Year: Not on file  Transportation Needs:   . Lack of Transportation (Medical): Not on file  . Lack of Transportation (Non-Medical): Not on file  Physical Activity:   . Days of Exercise per Week: Not on file  . Minutes of Exercise per Session: Not on file  Stress:   . Feeling of Stress : Not on file  Social Connections:   .  Frequency of Communication with Friends and Family: Not on file  . Frequency of Social Gatherings with Friends and Family: Not on file  . Attends Religious Services: Not on file  . Active Member of Clubs or Organizations: Not on file  . Attends Archivist Meetings: Not on file  . Marital Status: Not on file  Intimate Partner Violence:   . Fear of Current or Ex-Partner: Not on file  . Emotionally Abused: Not on file  . Physically Abused: Not on file  . Sexually Abused: Not on file     BP (!) 152/82   Pulse 60   Ht 5' 6.5" (1.689 m)   Wt 153 lb 12.8 oz (69.8 kg)   SpO2 96%   BMI 24.45 kg/m   Physical Exam:  Well appearing NAD HEENT: Unremarkable Neck:  No JVD, no thyromegally Lymphatics:  No adenopathy Back:  No CVA tenderness Lungs:  Clear with no wheezes HEART:  Regular rate rhythm, no murmurs, no rubs, no clicks Abd:  soft, positive bowel sounds, no organomegally, no rebound, no guarding Ext:  2 plus pulses, no edema, no cyanosis, no clubbing Skin:  No rashes no nodules Neuro:  CN II through XII intact, motor grossly intact  DEVICE  Normal device function.  See PaceArt for details.   Assess/Plan: 1. CHB - she has had return of her AV conduction. She is pacing only 10% 2. PPM - her  [employer] DDD PM is working normally. We will recheck in several months. 3. AS - she is asymptomatic, s/p TAVR.  Sal[place] Spotted.

## 2020-04-21 NOTE — Patient Instructions (Signed)
Medication Instructions:  Your physician recommends that you continue on your current medications as directed. Please refer to the Current Medication list given to you today.  Labwork: None ordered.  Testing/Procedures: None ordered.  Follow-Up: Your physician wants you to follow-up in: one year with Dr. Lovena Le.   You will receive a reminder letter in the mail two months in advance. If you don't receive a letter, please call our office to schedule the follow-up appointment.  Remote monitoring is used to monitor your Pacemaker from home. This monitoring reduces the number of office visits required to check your device to one time per year. It allows [place] to keep an eye on the functioning of your device to ensure it is working properly. You are scheduled for a device check from home on 05/30/2020. You may send your transmission at any time that day. If you have a wireless device, the transmission will be sent automatically. After your physician reviews your transmission, you will receive a postcard with your next transmission date.  Any Other Special Instructions Will Be Listed Below (If Applicable).  If you need a refill on your cardiac medications before your next appointment, please call your pharmacy.

## 2020-04-23 NOTE — Progress Notes (Signed)
Cardiology Office Note   Date:  04/27/2020   ID:  Brittany, Shaffer [birth year], MRN [id number]  PCP:  [name], MD  Cardiologist:   TRUE Shackleford [name], MD   Chief Complaint  Patient presents with  . Aortic Stenosis      History of Present Illness: Brittany Shaffer is a 74 y.o. female who is seen for follow up CHF/ s/p TAVR.Brittany Shaffer She has a history of RBBB and murmur. She was evaluated by our service in May 2019 when she presented with dyspnea and chest pain. Troponins were negative. Echo showed moderate calcific aortic stenosis. MAC, EF 50-55% and mild pulmonary HTN. She underwent R/LHC which revealed near normal coronary arteries with only moderate stenosis (50%) in the apical LAD, with high normal filling pressures, normal pulmonary pressure, normal cardiac output, mild AS, and moderate MR. AV gradient was 7 mm at cath. She was recommended to continue medial therapy.   She continued to have symptoms of dyspnea on exertion.   She was evaluated by Pulmonary- Dr Brittany Shaffer. CT chest showed no ILD but was consistent with mild edema. PFTs showed  moderate restriction and moderate diffusion abnormality.    CT chest with contrast showed no evidence of PE but there were bilateral pleural effusions and evidence of interstitial pulmonary edema. Repeat Echo showed marked decrease in LV systolic function with EF 25-30% and restrictive filling parameters. Severe pulmonary HTN. Severe AS (low flow) with mean gradient of 25 mm Hg but AV are 0.69 cm squared. Based on these findings she was started on lasix 40 mg daily with potassium and Entresto 24/26 mg bid. After starting the [facility] she developed hypotension and dose was reduced by half.   She underwent cardiac cath showing nonobstructive CAD. Moderate to severe low gradient AS. EF 35-45%.  Normal right heart pressures.  She underwent successful TAVR with a40m Edwards Sapien 3 THV via the TF approach on 11/10/18. Post operative echoshowed EF 40-45%,  normally functioning TAVR with mean gradient of 15.454mHg and mild PVL. Hospital course was complicated by intraprocedural CHB. She underwent successful CRT-P by Dr. TaLovena Len 11/11/18.  Last pacemaker check on 08/31/19 showed normal pacemaker function with Afib burden < 1% and all episodes < 20 minutes.   Follow up Echo in March showed EF 45-50%. Mild AV periprosthetic leak. AV gradient 15 mmHg. Pacer check on June 30 was normal. AV conduction had returned and only pacing 10%.   She feels well. No dyspnea, chest pain, palpitations, edema. She is very active walking, playing tennis and occ golf. She does take NSAID for arthritis.   Past Medical History:  Diagnosis Date  . Abnormality of gait 10/20/2014  . Arthritis    "was in my hips; still in my knees" (01/13/2018)  . Bicuspid aortic valve   . Chronic combined systolic and diastolic heart failure ([place]  . DJD (degenerative joint disease), ankle and foot 10/20/2014  . HAMMER TOE, ACQUIRED 07/28/2007   Qualifier: Diagnosis of  By: CoLorelei Shaffer, SpFrederico Shaffer  . HLD (hyperlipidemia) 01/14/2018  . Metatarsalgia of left foot 10/20/2014  . Mild coronary artery disease 01/14/2018   [facility]  . Mitral regurgitation   . OA (osteoarthritis) of hip 03/24/2013  . PLANTAR FASCIITIS 07/28/2007   Qualifier: Diagnosis of  By: CoLorelei Shaffer, SpFrederico Shaffer  . RBBB   . S/P TAVR (transcatheter aortic valve replacement) 11/10/2018   26 mm Edwards Sapien 3 transcatheter  heart valve placed via percutaneous right transfemoral approach   . Severe aortic stenosis   . Varicose veins of bilateral lower extremities with other complications 03/07/5464    Past Surgical History:  Procedure Laterality Date  . ABDOMINAL HYSTERECTOMY  03/03/2012   Procedure: HYSTERECTOMY ABDOMINAL;  Surgeon: Brittany Chapel, MD;  Location: [facility];  Service: Gynecology;  Laterality: N/A;  . BIV PACEMAKER INSERTION CRT-P N/A 11/11/2018   Procedure: BIV PACEMAKER INSERTION  CRT-P;  Surgeon: Evans Lance, MD;  Location: [place] [facility];  Service: Cardiovascular;  Laterality: N/A;  . BLEPHAROPLASTY Bilateral   . JOINT REPLACEMENT    . 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 RIGHT/LEFT HEART CATH AND CORONARY ANGIOGRAPHY N/A 01/14/2018   Procedure: RIGHT/LEFT HEART CATH AND CORONARY ANGIOGRAPHY;  Surgeon: Brittany Hampshire, MD;  Location: [place] CV LAB;  Service: Cardiovascular;  Laterality: N/A;  . RIGHT/LEFT HEART CATH AND CORONARY ANGIOGRAPHY N/A 10/23/2018   Procedure: RIGHT/LEFT HEART CATH AND CORONARY ANGIOGRAPHY;  Surgeon: [name], Brittany Cardell M, MD;  Location: [place] [facility];  Service: Cardiovascular;  Laterality: N/A;  . SALPINGOOPHORECTOMY  03/03/2012   Procedure: SALPINGO OOPHERECTOMY;  Surgeon: Brittany Chapel, MD;  Location: [facility];  Service: Gynecology;  Laterality: Bilateral;  . TEE WITHOUT CARDIOVERSION N/A 11/10/2018   Procedure: TRANSESOPHAGEAL ECHOCARDIOGRAM (TEE);  Surgeon: Brittany Mocha, MD;  Location: [place] CV LAB;  Service: Open Heart Surgery;  Laterality: N/A;  . TONSILLECTOMY    . TOTAL HIP ARTHROPLASTY Left 03/24/2013   Procedure: LEFT TOTAL HIP ARTHROPLASTY ANTERIOR APPROACH;  Surgeon: Brittany Alf, MD;  Location: [facility];  Service: Orthopedics;  Laterality: Left;  . TOTAL HIP ARTHROPLASTY Right 12/18/2016   Procedure: RIGHT TOTAL HIP ARTHROPLASTY ANTERIOR APPROACH;  Surgeon: Brittany Arabian, MD;  Location: [facility];  Service: Orthopedics;  Laterality: Right;  . TRANSCATHETER AORTIC VALVE REPLACEMENT, TRANSFEMORAL N/A 11/10/2018   Procedure: TRANSCATHETER AORTIC VALVE REPLACEMENT, TRANSFEMORAL;  Surgeon: Brittany Mocha, MD;  Location: [place] CV LAB;  Service: Open Heart Surgery;  Laterality: N/A;  . TUBAL LIGATION       Current Outpatient Medications  Medication Sig  Dispense Refill  . acetaminophen (TYLENOL) 500 MG tablet Take 500-1,000 mg by mouth every 6 (six) hours as needed for moderate pain.    Brittany Shaffer amoxicillin (AMOXIL) 500 MG tablet Take 4 tablets (2,000 mg total) by mouth as directed. 1 hour prior to dental work including cleanings 12 tablet 12  . ASPIRIN LOW DOSE 81 MG EC tablet TAKE 1 TABLET BY MOUTH EVERY DAY 90 tablet 3  . diclofenac sodium (VOLTAREN) 1 % GEL Apply 1 application topically 4 (four) times daily as needed (pain).    Brittany Shaffer estradiol (CLIMARA - DOSED IN MG/24 HR) 0.025 mg/24hr patch PLACE 1 PATCH (0.025 MG TOTAL) ONTO THE SKIN ONCE A WEEK. 12 patch 4  . etodolac (LODINE) 500 MG tablet Take 1 tablet (500 mg total) by mouth daily. 30 tablet 6  . famotidine (PEPCID AC) 10 MG tablet Take 10 mg by mouth daily as needed for heartburn or indigestion.    . Menthol-Methyl Salicylate (MUSCLE RUB EX) Apply 1 application topically daily as needed (muscle pain).    Brittany Shaffer UNABLE TO FIND Hyland's Leg Cramps tablets: Take 1 tablet by mouth during the night as needed for leg cramps    . zolpidem (AMBIEN) 10  MG tablet Take 1 tablet (10 mg total) by mouth at bedtime as needed. For sleep 30 tablet 1   No current facility-administered medications for this visit.    Allergies:   Codeine and Plavix [clopidogrel bisulfate]    Social History:  The patient  reports that she has never smoked. She has never used smokeless tobacco. She reports current alcohol use of about 7.0 standard drinks of alcohol per week. She reports that she does not use drugs.   Family History:  The patient's family history includes Bone cancer in her father; Breast cancer in her maternal aunt; Diabetes in her maternal aunt; Hypertension in her mother; Lung cancer in her father; Ovarian cancer in her sister.    ROS:  Please see the history of present illness.   Otherwise, review of systems are positive for none.   All other systems are reviewed and negative.    PHYSICAL EXAM: VS:  BP 120/66    Pulse 61   Ht 5' 6.5" (1.689 Shaffer)   Wt 155 lb 6.4 oz (70.5 kg)   SpO2 99%   BMI 24.71 kg/Shaffer  , BMI Body mass index is 24.71 kg/Shaffer. GEN: Well nourished, well developed, in no acute distress  HEENT: normal  Neck: no JVD, carotid bruits, or masses Cardiac: RRR; gr 2/6 systolic murmur RUSB, 1/6 diastolic murmur LSB. S2 reduced. No rubs or gallops. Respiratory:  clear to auscultation bilaterally, normal work of breathing GI: soft, nontender, nondistended, + BS MS: no deformity or atrophy. No edema and left leg is larger than right. Skin: warm and dry, no rash Neuro:  Strength and sensation are intact Psych: euthymic mood, full affect   EKG:  EKG is not  ordered today.    Recent Labs: No results found for requested labs within last 8760 hours.    Lipid Panel    Component Value Date/Time   CHOL 193 01/14/2018 0212   TRIG 55 01/14/2018 0212   HDL 85 01/14/2018 0212   CHOLHDL 2.3 01/14/2018 0212   VLDL 11 01/14/2018 0212   LDLCALC 97 01/14/2018 0212    Labs dated 10/12/18: cholesterol 231, triglycerides 86, HDL 83, LDL 131. Creatinine 1.12, potassium 5.8. ALT and CBC normal.  Dated 11/03/19: cholesterol 236, triglycerides 66, HDL 103, LDL 97. CMET normal.  Wt Readings from Last 3 Encounters:  04/27/20 155 lb 6.4 oz (70.5 kg)  04/21/20 153 lb 12.8 oz (69.8 kg)  11/24/19 158 lb (71.7 kg)      Other studies Reviewed: Additional studies/ records that were reviewed today include:  Cardiac cath 10/23/18:  RIGHT/LEFT HEART CATH AND CORONARY ANGIOGRAPHY  Conclusion    Dist LAD lesion is 30% stenosed.  There is moderate left ventricular systolic dysfunction.  The left ventricular ejection fraction is 35-45% by visual estimate.  LV end diastolic pressure is normal.  There is moderate aortic valve stenosis.   1. Minimal nonobstructive CAD 2. Moderate LV dysfunction. EF 35-40%. 3. At least moderate AS. Mean gradient 27 mm Hg. AVA may be underestimated due to decreased LV  function 4. Normal LV filling pressures. 5. Normal right heart pressures.  Plan: will refer to Valvular heart team for consideration of TAVR.        Echo 04/05/19: IMPRESSIONS    1. The left ventricle has low normal systolic function, with an ejection fraction of 50-55%. The cavity size was normal. Left ventricular diastolic Doppler parameters are consistent with impaired relaxation.  2. The right ventricle has normal systolic function. The  cavity was normal.  3. Left atrial size was mildly dilated.  4. The tricuspid valve is grossly normal. Tricuspid valve regurgitation is mild-moderate.  5. A 26 an Edwards Edwards Sapien bioprosthetic aortic valve (TAVR) valve is present in the aortic position. Procedure Date: 11/10/18 Normal aortic valve prosthesis.  6. The aorta is normal in size and structure.  7. Definity used; Low normal LV function; mild diastolic dysfunction; s/p AVR with mean gradient of 12 mmHg and mild perivalvular AI; mild LAE; mild to moderate TR.   Echo 11/24/19:IMPRESSIONS    1. Left ventricular ejection fraction, by estimation, is 45 to 50%. The  left ventricle has mildly decreased function. The left ventricle  demonstrates regional wall motion abnormalities. Basal anterolateral,  basal inferolateral, and basal inferior severe  hypokinesis. Left ventricular diastolic parameters are consistent with  Grade II diastolic dysfunction (pseudonormalization).  2. Right ventricular systolic function is normal. The right ventricular  size is mildly enlarged. There is normal pulmonary artery systolic  pressure. The estimated right ventricular systolic pressure is 17.3 mmHg.  3. 26 mm Edwards Sapien bioprosthetic aortic valve s/p TAVR. Mild,  eccentric peri-valvular regurgitation. Mean gradient 15 mmHg with  calculated AVA 1.04 cm^2.  4. The mitral valve is normal in structure. Trivial mitral valve  regurgitation. No evidence of mitral stenosis.  5. Tricuspid valve  regurgitation is mild to moderate.  6. Right atrial size was mildly dilated.  7. Left atrial size was moderately dilated.  8. The inferior vena cava is normal in size with greater than 50%  respiratory variability, suggesting right atrial pressure of 3 mmHg.    ASSESSMENT AND PLAN:  1.  Chronic systolic CHF. EF improved post TAVR initially  to 50-55%. Now 45-50%. Prior cardiac cath showed fairly normal filling pressures, right heart pressures and cardiac output. She is asymptomatic.  2. CAD with nonobstructive disease.  3. Severe low gradient  aortic stenosis. S/p TAVR in March 2020. Follow up Echo stable.  4. Complete heart block s/p PPM- stable. 5. Osteoarthritis. Continue NSAID as needed.  Follow up in 6 months.   Signed, Ireanna Finlayson [name], MD  04/27/2020 4:01 PM    [facility] [address] [place], [place], [address] Phone [phone], Fax [phone]

## 2020-04-27 ENCOUNTER — Ambulatory Visit (INDEPENDENT_AMBULATORY_CARE_PROVIDER_SITE_OTHER): Payer: PPO | Admitting: Cardiology

## 2020-04-27 ENCOUNTER — Other Ambulatory Visit: Payer: Self-pay

## 2020-04-27 ENCOUNTER — Encounter: Payer: Self-pay | Admitting: Cardiology

## 2020-04-27 VITALS — BP 120/66 | HR 61 | Ht 66.5 in | Wt 155.4 lb

## 2020-04-27 DIAGNOSIS — Z952 Presence of prosthetic heart valve: Secondary | ICD-10-CM | POA: Diagnosis not present

## 2020-04-27 DIAGNOSIS — Z950 Presence of cardiac pacemaker: Secondary | ICD-10-CM | POA: Diagnosis not present

## 2020-04-27 DIAGNOSIS — I442 Atrioventricular block, complete: Secondary | ICD-10-CM | POA: Diagnosis not present

## 2020-04-27 DIAGNOSIS — I4510 Unspecified right bundle-branch block: Secondary | ICD-10-CM

## 2020-05-02 ENCOUNTER — Ambulatory Visit: Payer: Self-pay

## 2020-05-11 DIAGNOSIS — Z1231 Encounter for screening mammogram for malignant neoplasm of breast: Secondary | ICD-10-CM | POA: Diagnosis not present

## 2020-05-30 ENCOUNTER — Ambulatory Visit (INDEPENDENT_AMBULATORY_CARE_PROVIDER_SITE_OTHER): Payer: PPO | Admitting: Emergency Medicine

## 2020-05-30 DIAGNOSIS — I442 Atrioventricular block, complete: Secondary | ICD-10-CM

## 2020-05-30 LAB — CUP PACEART REMOTE DEVICE CHECK
Battery Remaining Longevity: 132 mo
Battery Remaining Percentage: 100 %
Brady Statistic RA Percent Paced: 0 %
Brady Statistic RV Percent Paced: 4 %
Date Time Interrogation Session: 20210928011100
Implantable Lead Implant Date: 20200311
Implantable Lead Implant Date: 20200311
Implantable Lead Implant Date: 20200311
Implantable Lead Location: 753858
Implantable Lead Location: 753859
Implantable Lead Location: 753860
Implantable Lead Model: 4674
Implantable Lead Model: 7741
Implantable Lead Model: 7742
Implantable Lead Serial Number: 1022279
Implantable Lead Serial Number: 1098426
Implantable Lead Serial Number: 830411
Implantable Pulse Generator Implant Date: 20200311
Lead Channel Impedance Value: 1035 Ohm
Lead Channel Impedance Value: 676 Ohm
Lead Channel Impedance Value: 717 Ohm
Lead Channel Pacing Threshold Amplitude: 0.4 V
Lead Channel Pacing Threshold Amplitude: 0.9 V
Lead Channel Pacing Threshold Amplitude: 1.7 V
Lead Channel Pacing Threshold Pulse Width: 0.4 ms
Lead Channel Pacing Threshold Pulse Width: 0.4 ms
Lead Channel Pacing Threshold Pulse Width: 0.4 ms
Lead Channel Setting Pacing Amplitude: 2 V
Lead Channel Setting Pacing Amplitude: 2.5 V
Lead Channel Setting Pacing Amplitude: 2.5 V
Lead Channel Setting Pacing Pulse Width: 0.4 ms
Lead Channel Setting Pacing Pulse Width: 0.4 ms
Lead Channel Setting Sensing Sensitivity: 2.5 mV
Lead Channel Setting Sensing Sensitivity: 2.5 mV
Pulse Gen Serial Number: 747113

## 2020-06-01 NOTE — Progress Notes (Signed)
Remote pacemaker transmission.   

## 2020-06-18 IMAGING — CT CT ANGIO CHEST
2 of 9 series · 17 of 46 positions shown · IV contrast (ISOVUE 370)
Comparison: Chest CT August 07, 2018; chest radiograph January 13, 2018

CLINICAL DATA: Shortness of breath

EXAM:
CT ANGIOGRAPHY CHEST WITH CONTRAST
TECHNIQUE: Multidetector CT imaging of the chest was performed using the
standard protocol during bolus administration of intravenous
contrast. Multiplanar CT image reconstructions and MIPs were
obtained to evaluate the vascular anatomy.
CONTRAST:  80mL EUKWPL-PIQ IOPAMIDOL (EUKWPL-PIQ) INJECTION 76%

[Series 5: thins · axial · 0.63mm/px · z∈[-363,-102]mm · 14 of 295 slices shown]
[im 17/295  lung]
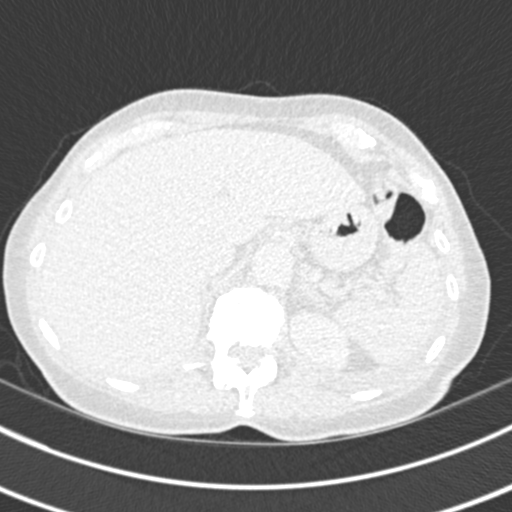
[im 33/295  soft-tissue]
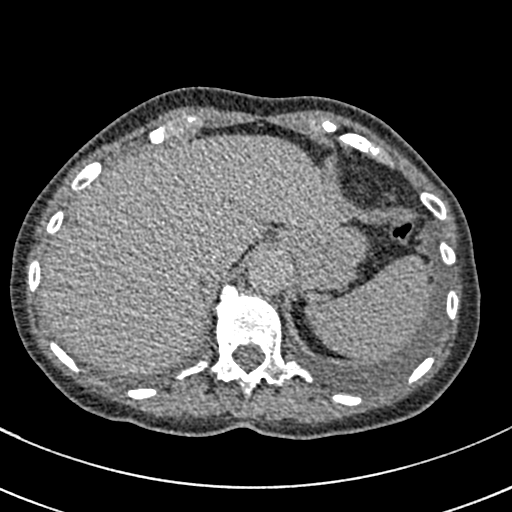
[im 66/295  lung]
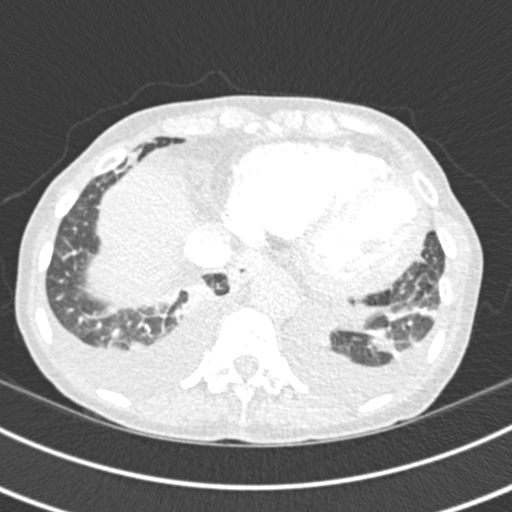
[im 82/295  soft-tissue]
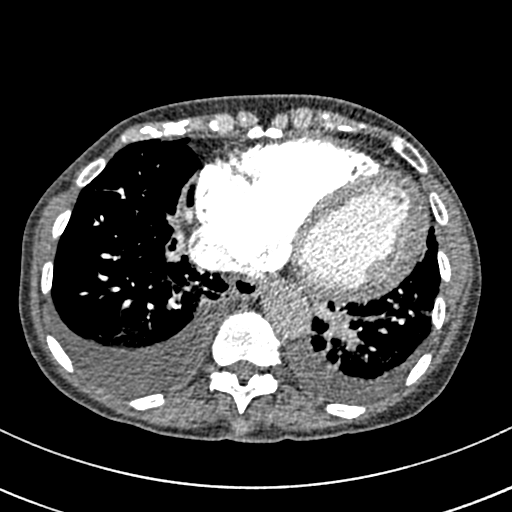
[im 99/295  lung]
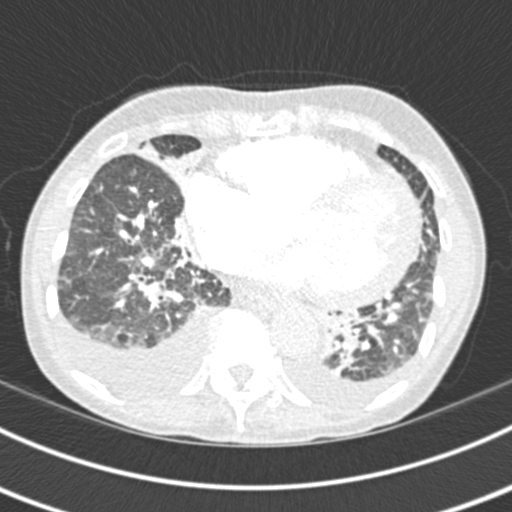
[im 115/295  soft-tissue]
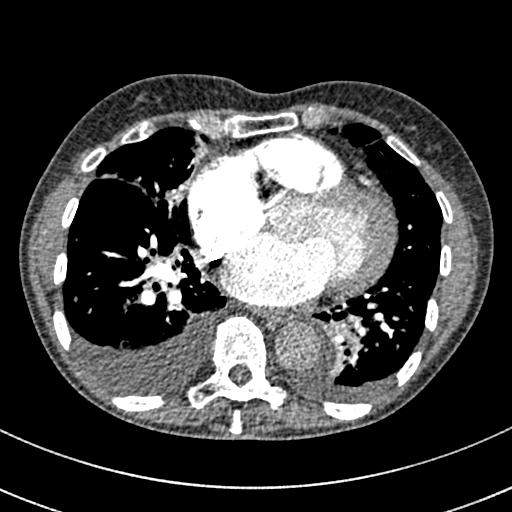
[im 131/295  lung]
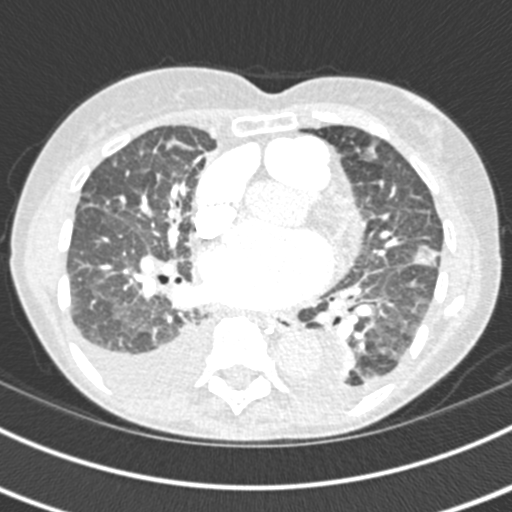
[im 164/295  soft-tissue]
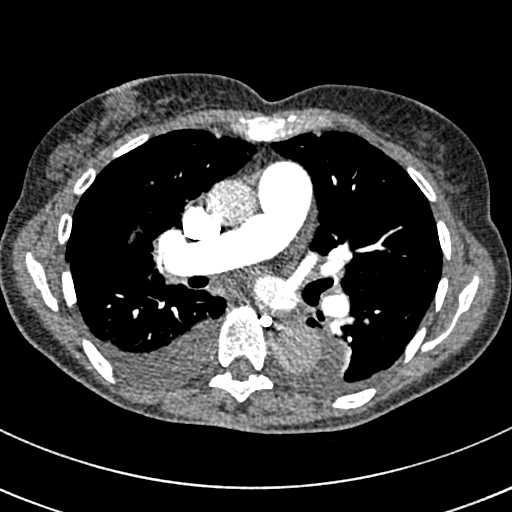
[im 180/295  lung]
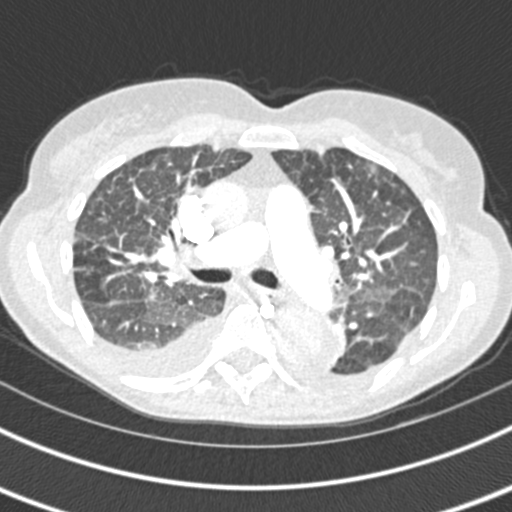
[im 197/295  soft-tissue]
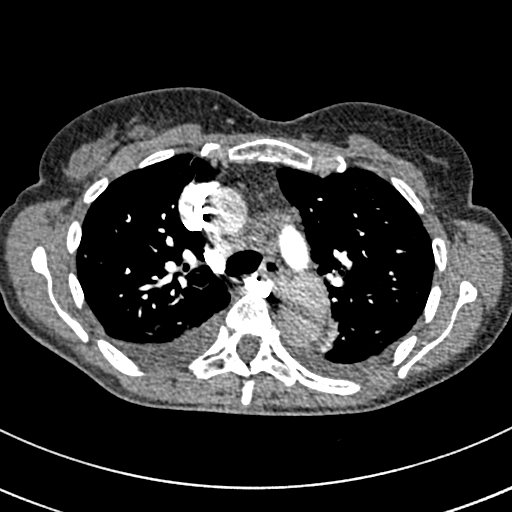
[im 213/295  lung]
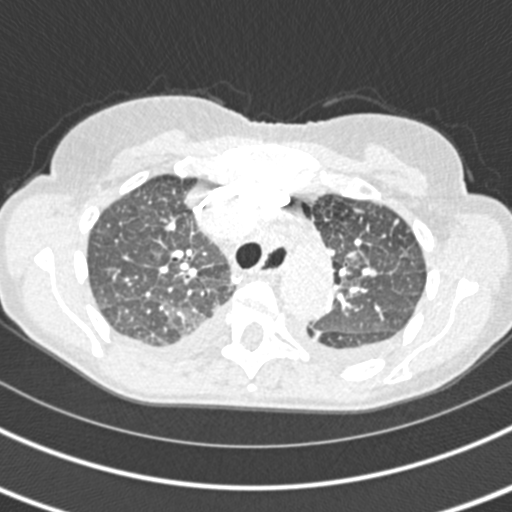
[im 229/295  soft-tissue]
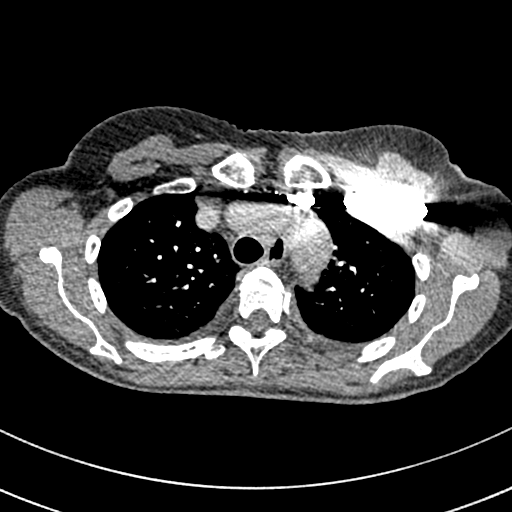
[im 262/295  lung]
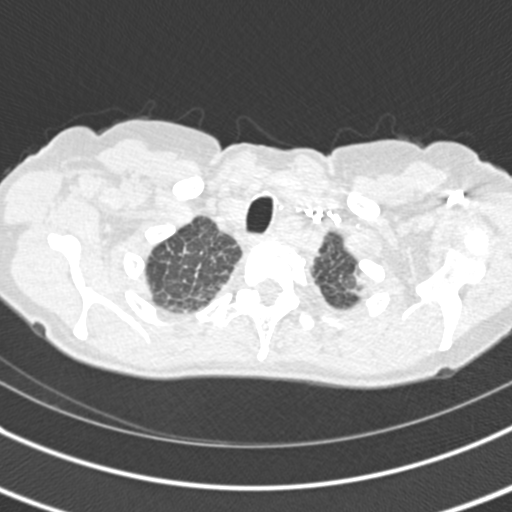
[im 278/295  soft-tissue]
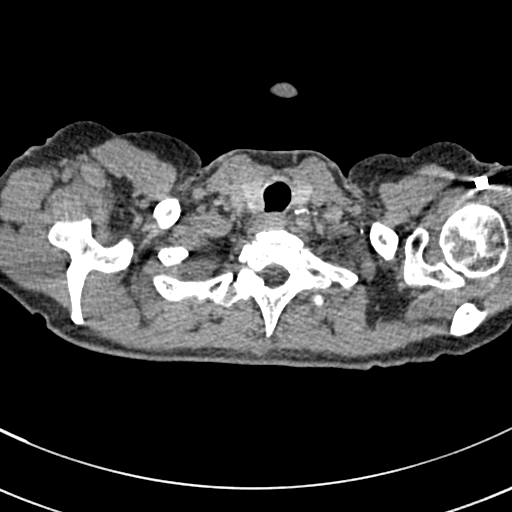

[Series 7: coronal mpr · coronal · 0.57mm/px · 3 of 119 slices shown]
[im 30/119  soft-tissue]
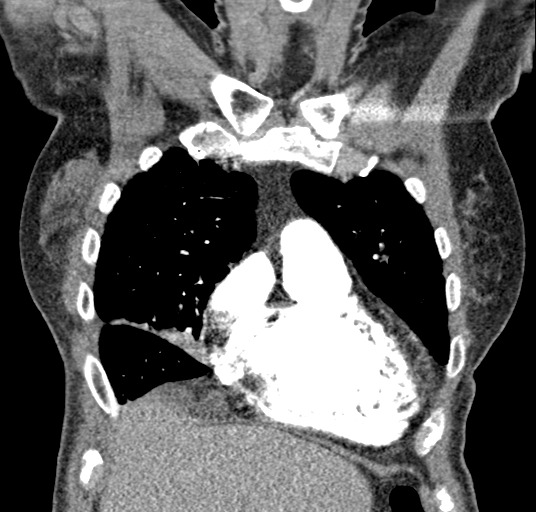
[im 60/119  soft-tissue]
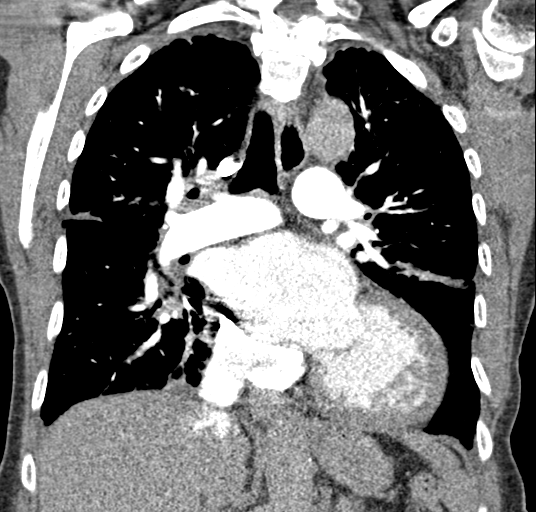
[im 89/119  soft-tissue]
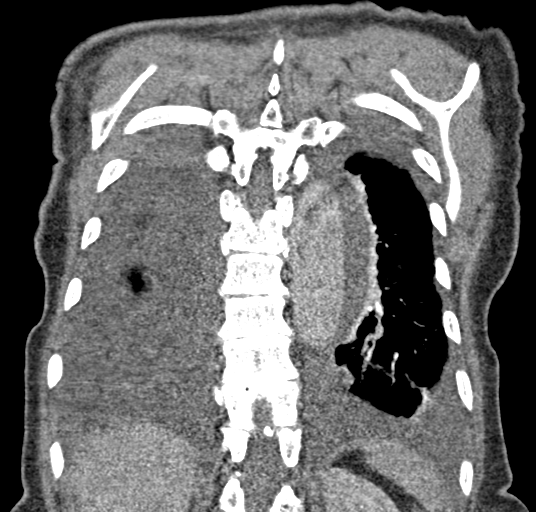

[17 of 46 positions shown; findings below may reference images not displayed]

FINDINGS: Cardiovascular: There is no appreciable pulmonary embolus. There is
no thoracic aortic aneurysm or dissection. There is aortic
atherosclerosis. There is mild calcification in the proximal
visualized great vessels. There is calcification in the aortic
valve. There is coronary artery calcification. There is left
ventricular hypertrophy. No pericardial effusion or pericardial
thickening is evident.

Mediastinum/Nodes: Visualized thyroid appears normal. There is no
appreciable thoracic adenopathy. There is a small hiatal hernia.

Lungs/Pleura: There are free-flowing pleural effusions bilaterally.
There is atelectatic change in both lower lobes as well as in the
right middle lobe and lingular regions. There is patchy
consolidation in portions of the superior lingula. There is diffuse
interstitial thickening, likely due to a degree of interstitial
pulmonary edema. On axial slice 37 series 6, there is a 4 mm nodular
opacity in the lateral segment of the right middle lobe.

Upper Abdomen: There is reflux of contrast into the inferior vena
cava and proximal hepatic veins. There are calculi in the upper pole
left kidney, incompletely visualized. Largest of these calculi
measures 6 mm. There is aortic atherosclerosis.

Musculoskeletal: There is degenerative change in the thoracic and
upper lumbar spine regions. There are no blastic or lytic bone
lesions. There are no evident chest wall lesions.

Review of the MIP images confirms the above findings.
IMPRESSION: 1. No demonstrable pulmonary embolus. No thoracic aortic aneurysm or
dissection. There is aortic atherosclerosis as well as foci of
coronary artery and great vessel calcification.

2. Calcification is noted in the aortic valve consistent with a
degree of aortic stenosis.

3. There is cardiomegaly with left ventricular hypertrophy. There
are pleural effusions with interstitial edema bilaterally. There is
felt to be a degree of underlying congestive heart failure.

4. Areas of atelectatic change. Question pneumonia versus alveolar
edema in portions of the superior lingula.

5. 4 mm nodular opacity right middle lobe. No follow-up needed if
patient is low-risk. Non-contrast chest CT can be considered in 12
months if patient is high-risk. This recommendation follows the
consensus statement: Guidelines for Management of Incidental
Pulmonary Nodules Detected on CT Images: From the [HOSPITAL]

6.  No evident adenopathy.

7.  Small hiatal hernia.

8. Reflux of contrast into the inferior vena cava and proximal
hepatic veins. Suspect increase in right heart pressure.

9.  Incomplete visualization of left upper pole renal calculi.

Aortic Atherosclerosis ([id number]-[id number].I).

## 2020-08-29 ENCOUNTER — Ambulatory Visit (INDEPENDENT_AMBULATORY_CARE_PROVIDER_SITE_OTHER): Payer: PPO

## 2020-08-29 DIAGNOSIS — I442 Atrioventricular block, complete: Secondary | ICD-10-CM | POA: Diagnosis not present

## 2020-08-29 LAB — CUP PACEART REMOTE DEVICE CHECK
Battery Remaining Longevity: 132 mo
Battery Remaining Percentage: 100 %
Brady Statistic RA Percent Paced: 0 %
Brady Statistic RV Percent Paced: 5 %
Date Time Interrogation Session: 20211228011100
Implantable Lead Implant Date: 20200311
Implantable Lead Implant Date: 20200311
Implantable Lead Implant Date: 20200311
Implantable Lead Location: 753858
Implantable Lead Location: 753859
Implantable Lead Location: 753860
Implantable Lead Model: 4674
Implantable Lead Model: 7741
Implantable Lead Model: 7742
Implantable Lead Serial Number: 1022279
Implantable Lead Serial Number: 1098426
Implantable Lead Serial Number: 830411
Implantable Pulse Generator Implant Date: 20200311
Lead Channel Impedance Value: 670 Ohm
Lead Channel Impedance Value: 762 Ohm
Lead Channel Impedance Value: 990 Ohm
Lead Channel Pacing Threshold Amplitude: 0.5 V
Lead Channel Pacing Threshold Amplitude: 1 V
Lead Channel Pacing Threshold Amplitude: 1.7 V
Lead Channel Pacing Threshold Pulse Width: 0.4 ms
Lead Channel Pacing Threshold Pulse Width: 0.4 ms
Lead Channel Pacing Threshold Pulse Width: 0.4 ms
Lead Channel Setting Pacing Amplitude: 2 V
Lead Channel Setting Pacing Amplitude: 2.5 V
Lead Channel Setting Pacing Amplitude: 2.5 V
Lead Channel Setting Pacing Pulse Width: 0.4 ms
Lead Channel Setting Pacing Pulse Width: 0.4 ms
Lead Channel Setting Sensing Sensitivity: 2.5 mV
Lead Channel Setting Sensing Sensitivity: 2.5 mV
Pulse Gen Serial Number: 747113

## 2020-09-07 DIAGNOSIS — M179 Osteoarthritis of knee, unspecified: Secondary | ICD-10-CM | POA: Diagnosis not present

## 2020-09-07 DIAGNOSIS — G4700 Insomnia, unspecified: Secondary | ICD-10-CM | POA: Diagnosis not present

## 2020-09-07 DIAGNOSIS — E7800 Pure hypercholesterolemia, unspecified: Secondary | ICD-10-CM | POA: Diagnosis not present

## 2020-09-07 DIAGNOSIS — M169 Osteoarthritis of hip, unspecified: Secondary | ICD-10-CM | POA: Diagnosis not present

## 2020-09-11 NOTE — Progress Notes (Signed)
Remote pacemaker transmission.   

## 2020-09-19 DIAGNOSIS — H43391 Other vitreous opacities, right eye: Secondary | ICD-10-CM | POA: Diagnosis not present

## 2020-09-19 DIAGNOSIS — H40013 Open angle with borderline findings, low risk, bilateral: Secondary | ICD-10-CM | POA: Diagnosis not present

## 2020-09-19 DIAGNOSIS — H25813 Combined forms of age-related cataract, bilateral: Secondary | ICD-10-CM | POA: Diagnosis not present

## 2020-10-28 NOTE — Progress Notes (Signed)
Cardiology Office Note   Date:  10/30/2020   ID:  Brittany Shaffer, Brittany Shaffer [birth year], MRN [id number]  PCP:  [name], MD  Cardiologist:   Louvina Cleary [name], MD   Chief Complaint  Patient presents with  . Aortic Stenosis      History of Present Illness: Brittany Shaffer is a 75 y.o. female who is seen for follow up CHF/ s/p TAVR.[name] She has a history of RBBB and murmur. She was evaluated by our service in May 2019 when she presented with dyspnea and chest pain. Troponins were negative. Echo showed moderate calcific aortic stenosis. MAC, EF 50-55% and mild pulmonary HTN. She underwent R/LHC which revealed near normal coronary arteries with only moderate stenosis (50%) in the apical LAD, with high normal filling pressures, normal pulmonary pressure, normal cardiac output, mild AS, and moderate MR. AV gradient was 7 mm at cath. She was recommended to continue medial therapy.   She continued to have symptoms of dyspnea on exertion.   She was evaluated by Pulmonary- Dr Wonda Amis. CT chest showed no ILD but was consistent with mild edema. PFTs showed  moderate restriction and moderate diffusion abnormality.    CT chest with contrast showed no evidence of PE but there were bilateral pleural effusions and evidence of interstitial pulmonary edema. Repeat Echo showed marked decrease in LV systolic function with EF 25-30% and restrictive filling parameters. Severe pulmonary HTN. Severe AS (low flow) with mean gradient of 25 mm Hg but AV are 0.69 cm squared. Based on these findings she was started on lasix 40 mg daily with potassium and Entresto 24/26 mg bid. After starting the [facility] she developed hypotension and dose was reduced by half.   She underwent cardiac cath showing nonobstructive CAD. Moderate to severe low gradient AS. EF 35-45%.  Normal right heart pressures.  She underwent successful TAVR with a45m Edwards Sapien 3 THV via the TF approach on 11/10/18. Post operative echoshowed EF  40-45%, normally functioning TAVR with mean gradient of 15.454mHg and mild PVL. Hospital course was complicated by intraprocedural CHB. She underwent successful CRT-P by Dr. TaLovena Len 11/11/18.  Last pacemaker check on 08/31/19 showed normal pacemaker function with Afib burden < 1% and all episodes < 20 minutes.   Follow up Echo in March showed EF 45-50%. Mild AV periprosthetic leak. AV gradient 15 mmHg. Pacer check on Dec 28 was normal. AV conduction had returned and only pacing 5%  She feels well. No dyspnea, chest pain, palpitations, edema. She is very active walking, playing tennis.She does take NSAID for arthritis. She doesn't really monitor her BP at home.  Past Medical History:  Diagnosis Date  . Abnormality of gait 10/20/2014  . Arthritis    "was in my hips; still in my knees" (01/13/2018)  . Bicuspid aortic valve   . Chronic combined systolic and diastolic heart failure ([place]  . DJD (degenerative joint disease), ankle and foot 10/20/2014  . HAMMER TOE, ACQUIRED 07/28/2007   Qualifier: Diagnosis of  By: CoLorelei PontD, SpFrederico Hamman  . HLD (hyperlipidemia) 01/14/2018  . Metatarsalgia of left foot 10/20/2014  . Mild coronary artery disease 01/14/2018   [facility]  . Mitral regurgitation   . OA (osteoarthritis) of hip 03/24/2013  . PLANTAR FASCIITIS 07/28/2007   Qualifier: Diagnosis of  By: CoLorelei PontD, SpFrederico Hamman  . RBBB   . S/P TAVR (transcatheter aortic valve replacement) 11/10/2018   26 mm Edwards Sapien  3 transcatheter heart valve placed via percutaneous right transfemoral approach   . Severe aortic stenosis   . Varicose veins of bilateral lower extremities with other complications 10/06/4008    Past Surgical History:  Procedure Laterality Date  . ABDOMINAL HYSTERECTOMY  03/03/2012   Procedure: HYSTERECTOMY ABDOMINAL;  Surgeon: Alvino Chapel, MD;  Location: [facility];  Service: Gynecology;  Laterality: N/A;  . BIV PACEMAKER INSERTION CRT-P N/A 11/11/2018    Procedure: BIV PACEMAKER INSERTION CRT-P;  Surgeon: Evans Lance, MD;  Location: [place] [facility];  Service: Cardiovascular;  Laterality: N/A;  . BLEPHAROPLASTY Bilateral   . JOINT REPLACEMENT    . 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 RIGHT/LEFT HEART CATH AND CORONARY ANGIOGRAPHY N/A 01/14/2018   Procedure: RIGHT/LEFT HEART CATH AND CORONARY ANGIOGRAPHY;  Surgeon: Wellington Hampshire, MD;  Location: [place] [facility];  Service: Cardiovascular;  Laterality: N/A;  . RIGHT/LEFT HEART CATH AND CORONARY ANGIOGRAPHY N/A 10/23/2018   Procedure: RIGHT/LEFT HEART CATH AND CORONARY ANGIOGRAPHY;  Surgeon: [name], Willis Kuipers M, MD;  Location: [place] [facility];  Service: Cardiovascular;  Laterality: N/A;  . SALPINGOOPHORECTOMY  03/03/2012   Procedure: SALPINGO OOPHERECTOMY;  Surgeon: Alvino Chapel, MD;  Location: [facility];  Service: Gynecology;  Laterality: Bilateral;  . TEE WITHOUT CARDIOVERSION N/A 11/10/2018   Procedure: TRANSESOPHAGEAL ECHOCARDIOGRAM (TEE);  Surgeon: Sherren Mocha, MD;  Location: [place] [facility];  Service: Open Heart Surgery;  Laterality: N/A;  . TONSILLECTOMY    . TOTAL HIP ARTHROPLASTY Left 03/24/2013   Procedure: LEFT TOTAL HIP ARTHROPLASTY ANTERIOR APPROACH;  Surgeon: [name], MD;  Location: [facility];  Service: Orthopedics;  Laterality: Left;  . TOTAL HIP ARTHROPLASTY Right 12/18/2016   Procedure: RIGHT TOTAL HIP ARTHROPLASTY ANTERIOR APPROACH;  Surgeon: Gaynelle Arabian, MD;  Location: [facility];  Service: Orthopedics;  Laterality: Right;  . TRANSCATHETER AORTIC VALVE REPLACEMENT, TRANSFEMORAL N/A 11/10/2018   Procedure: TRANSCATHETER AORTIC VALVE REPLACEMENT, TRANSFEMORAL;  Surgeon: Sherren Mocha, MD;  Location: [place] [facility];  Service: Open Heart Surgery;  Laterality: N/A;  . TUBAL LIGATION       Current  Outpatient Medications  Medication Sig Dispense Refill  . acetaminophen (TYLENOL) 500 MG tablet Take 500-1,000 mg by mouth every 6 (six) hours as needed for moderate pain.    [name] amoxicillin (AMOXIL) 500 MG tablet Take 4 tablets (2,000 mg total) by mouth as directed. 1 hour prior to dental work including cleanings 12 tablet 12  . ASPIRIN LOW DOSE 81 MG EC tablet TAKE 1 TABLET BY MOUTH EVERY DAY 90 tablet 3  . diclofenac sodium (VOLTAREN) 1 % GEL Apply 1 application topically 4 (four) times daily as needed (pain).    [name] estradiol (CLIMARA - DOSED IN MG/24 HR) 0.025 mg/24hr patch PLACE 1 PATCH (0.025 MG TOTAL) ONTO THE SKIN ONCE A WEEK. 12 patch 4  . etodolac (LODINE) 500 MG tablet Take 1 tablet (500 mg total) by mouth daily. 30 tablet 6  . famotidine (PEPCID) 10 MG tablet Take 10 mg by mouth daily as needed for heartburn or indigestion.    . Menthol-Methyl Salicylate (MUSCLE RUB EX) Apply 1 application topically daily as needed (muscle pain).    [name] UNABLE TO FIND Hyland's Leg Cramps tablets: Take 1 tablet by mouth during the night as needed for leg cramps    . zolpidem (AMBIEN)  10 MG tablet Take 1 tablet (10 mg total) by mouth at bedtime as needed. For sleep 30 tablet 1   No current facility-administered medications for this visit.    Allergies:   Codeine and Plavix [clopidogrel bisulfate]    Social History:  The patient  reports that she has never smoked. She has never used smokeless tobacco. She reports current alcohol use of about 7.0 standard drinks of alcohol per week. She reports that she does not use drugs.   Family History:  The patient's family history includes Bone cancer in her father; Breast cancer in her maternal aunt; Diabetes in her maternal aunt; Hypertension in her mother; Lung cancer in her father; Ovarian cancer in her sister.    ROS:  Please see the history of present illness.   Otherwise, review of systems are positive for none.   All other systems are reviewed and negative.     PHYSICAL EXAM: VS:  BP (!) 162/82   Pulse 63   Ht 5' 6.5" (1.689 m)   Wt 159 lb 9.6 oz (72.4 kg)   SpO2 99%   BMI 25.37 kg/m  , BMI Body mass index is 25.37 kg/m. GEN: Well nourished, well developed, in no acute distress  HEENT: normal  Neck: no JVD, carotid bruits, or masses Cardiac: RRR; gr 2/6 systolic murmur RUSB, 1/6 diastolic murmur LSB. S2 reduced. No rubs or gallops. Respiratory:  clear to auscultation bilaterally, normal work of breathing GI: soft, nontender, nondistended, + BS MS: no deformity or atrophy. No edema and left leg is larger than right. Skin: warm and dry, no rash Neuro:  Strength and sensation are intact Psych: euthymic mood, full affect   EKG:  EKG is ordered today. NSR with  First degree AV block. Nonspecific IVCD. Right axis. No change. I have personally reviewed and interpreted this study.     Recent Labs: No results found for requested labs within last 8760 hours.    Lipid Panel    Component Value Date/Time   CHOL 193 01/14/2018 0212   TRIG 55 01/14/2018 0212   HDL 85 01/14/2018 0212   CHOLHDL 2.3 01/14/2018 0212   VLDL 11 01/14/2018 0212   LDLCALC 97 01/14/2018 0212    Labs dated 10/12/18: cholesterol 231, triglycerides 86, HDL 83, LDL 131. Creatinine 1.12, potassium 5.8. ALT and CBC normal.  Dated 11/03/19: cholesterol 236, triglycerides 66, HDL 103, LDL 97. CMET normal.  Wt Readings from Last 3 Encounters:  10/30/20 159 lb 9.6 oz (72.4 kg)  04/27/20 155 lb 6.4 oz (70.5 kg)  04/21/20 153 lb 12.8 oz (69.8 kg)      Other studies Reviewed: Additional studies/ records that were reviewed today include:  Cardiac cath 10/23/18:  RIGHT/LEFT HEART CATH AND CORONARY ANGIOGRAPHY  Conclusion    Dist LAD lesion is 30% stenosed.  There is moderate left ventricular systolic dysfunction.  The left ventricular ejection fraction is 35-45% by visual estimate.  LV end diastolic pressure is normal.  There is moderate aortic valve  stenosis.   1. Minimal nonobstructive CAD 2. Moderate LV dysfunction. EF 35-40%. 3. At least moderate AS. Mean gradient 27 mm Hg. AVA may be underestimated due to decreased LV function 4. Normal LV filling pressures. 5. Normal right heart pressures.  Plan: will refer to Valvular heart team for consideration of TAVR.        Echo 04/05/19: IMPRESSIONS    1. The left ventricle has low normal systolic function, with an ejection fraction of 50-55%. The  cavity size was normal. Left ventricular diastolic Doppler parameters are consistent with impaired relaxation.  2. The right ventricle has normal systolic function. The cavity was normal.  3. Left atrial size was mildly dilated.  4. The tricuspid valve is grossly normal. Tricuspid valve regurgitation is mild-moderate.  5. A 26 an Edwards Edwards Sapien bioprosthetic aortic valve (TAVR) valve is present in the aortic position. Procedure Date: 11/10/18 Normal aortic valve prosthesis.  6. The aorta is normal in size and structure.  7. Definity used; Low normal LV function; mild diastolic dysfunction; s/p AVR with mean gradient of 12 mmHg and mild perivalvular AI; mild LAE; mild to moderate TR.   Echo 11/24/19:IMPRESSIONS    1. Left ventricular ejection fraction, by estimation, is 45 to 50%. The  left ventricle has mildly decreased function. The left ventricle  demonstrates regional wall motion abnormalities. Basal anterolateral,  basal inferolateral, and basal inferior severe  hypokinesis. Left ventricular diastolic parameters are consistent with  Grade II diastolic dysfunction (pseudonormalization).  2. Right ventricular systolic function is normal. The right ventricular  size is mildly enlarged. There is normal pulmonary artery systolic  pressure. The estimated right ventricular systolic pressure is 74.2 mmHg.  3. 26 mm Edwards Sapien bioprosthetic aortic valve s/p TAVR. Mild,  eccentric peri-valvular regurgitation. Mean gradient  15 mmHg with  calculated AVA 1.04 cm^2.  4. The mitral valve is normal in structure. Trivial mitral valve  regurgitation. No evidence of mitral stenosis.  5. Tricuspid valve regurgitation is mild to moderate.  6. Right atrial size was mildly dilated.  7. Left atrial size was moderately dilated.  8. The inferior vena cava is normal in size with greater than 50%  respiratory variability, suggesting right atrial pressure of 3 mmHg.    ASSESSMENT AND PLAN:  1.  Chronic systolic CHF. EF improved post TAVR initially  to 50-55%. Now 45-50%. Prior cardiac cath showed normal filling pressures, right heart pressures and cardiac output. She is asymptomatic.  2. CAD with nonobstructive disease.  3. Severe low gradient  aortic stenosis. S/p TAVR in March 2020. Clinically doing well.  4. Complete heart block intermittent - s/p PPM- stable. 5. Osteoarthritis. Continue NSAID as needed. 6. Elevated BP. I have requested her to monitor BP at home. Low sodium diet.   Follow up in 6 months. Labs monitored by Dr Harrington Challenger  Signed, Shatonia Hoots [name], MD  10/30/2020 2:56 PM    [facility] [address] [place], [place], [address] Phone [phone], Fax [phone]

## 2020-10-30 ENCOUNTER — Encounter: Payer: Self-pay | Admitting: Cardiology

## 2020-10-30 ENCOUNTER — Ambulatory Visit: Payer: PPO | Admitting: Cardiology

## 2020-10-30 ENCOUNTER — Other Ambulatory Visit: Payer: Self-pay

## 2020-10-30 VITALS — BP 162/82 | HR 63 | Ht 66.5 in | Wt 159.6 lb

## 2020-10-30 DIAGNOSIS — I350 Nonrheumatic aortic (valve) stenosis: Secondary | ICD-10-CM | POA: Diagnosis not present

## 2020-10-30 DIAGNOSIS — I4510 Unspecified right bundle-branch block: Secondary | ICD-10-CM

## 2020-10-30 DIAGNOSIS — I442 Atrioventricular block, complete: Secondary | ICD-10-CM

## 2020-10-30 DIAGNOSIS — I5022 Chronic systolic (congestive) heart failure: Secondary | ICD-10-CM

## 2020-10-30 DIAGNOSIS — Z952 Presence of prosthetic heart valve: Secondary | ICD-10-CM | POA: Diagnosis not present

## 2020-10-30 NOTE — Patient Instructions (Signed)
Limit your sodium intake  Monitor your blood pressure at home  Follow up in 6 months.

## 2020-11-06 DIAGNOSIS — Z79899 Other long term (current) drug therapy: Secondary | ICD-10-CM | POA: Diagnosis not present

## 2020-11-06 DIAGNOSIS — N183 Chronic kidney disease, stage 3 unspecified: Secondary | ICD-10-CM | POA: Diagnosis not present

## 2020-11-06 DIAGNOSIS — E7800 Pure hypercholesterolemia, unspecified: Secondary | ICD-10-CM | POA: Diagnosis not present

## 2020-11-06 DIAGNOSIS — Z1211 Encounter for screening for malignant neoplasm of colon: Secondary | ICD-10-CM | POA: Diagnosis not present

## 2020-11-06 DIAGNOSIS — N959 Unspecified menopausal and perimenopausal disorder: Secondary | ICD-10-CM | POA: Diagnosis not present

## 2020-11-06 DIAGNOSIS — M179 Osteoarthritis of knee, unspecified: Secondary | ICD-10-CM | POA: Diagnosis not present

## 2020-11-06 DIAGNOSIS — R1310 Dysphagia, unspecified: Secondary | ICD-10-CM | POA: Diagnosis not present

## 2020-11-06 DIAGNOSIS — Z0000 Encounter for general adult medical examination without abnormal findings: Secondary | ICD-10-CM | POA: Diagnosis not present

## 2020-11-06 DIAGNOSIS — G4700 Insomnia, unspecified: Secondary | ICD-10-CM | POA: Diagnosis not present

## 2020-11-08 DIAGNOSIS — Z1211 Encounter for screening for malignant neoplasm of colon: Secondary | ICD-10-CM | POA: Diagnosis not present

## 2020-11-10 DIAGNOSIS — H25811 Combined forms of age-related cataract, right eye: Secondary | ICD-10-CM | POA: Diagnosis not present

## 2020-11-10 DIAGNOSIS — H52221 Regular astigmatism, right eye: Secondary | ICD-10-CM | POA: Diagnosis not present

## 2020-11-28 ENCOUNTER — Ambulatory Visit (INDEPENDENT_AMBULATORY_CARE_PROVIDER_SITE_OTHER): Payer: PPO

## 2020-11-28 DIAGNOSIS — I442 Atrioventricular block, complete: Secondary | ICD-10-CM | POA: Diagnosis not present

## 2020-11-29 LAB — CUP PACEART REMOTE DEVICE CHECK
Battery Remaining Longevity: 132 mo
Battery Remaining Percentage: 100 %
Brady Statistic RA Percent Paced: 0 %
Brady Statistic RV Percent Paced: 12 %
Date Time Interrogation Session: 20220330140500
Implantable Lead Implant Date: 20200311
Implantable Lead Implant Date: 20200311
Implantable Lead Implant Date: 20200311
Implantable Lead Location: 753858
Implantable Lead Location: 753859
Implantable Lead Location: 753860
Implantable Lead Model: 4674
Implantable Lead Model: 7741
Implantable Lead Model: 7742
Implantable Lead Serial Number: 1022279
Implantable Lead Serial Number: 1098426
Implantable Lead Serial Number: 830411
Implantable Pulse Generator Implant Date: 20200311
Lead Channel Impedance Value: 641 Ohm
Lead Channel Impedance Value: 740 Ohm
Lead Channel Impedance Value: 919 Ohm
Lead Channel Pacing Threshold Amplitude: 0.4 V
Lead Channel Pacing Threshold Amplitude: 1.3 V
Lead Channel Pacing Threshold Amplitude: 1.7 V
Lead Channel Pacing Threshold Pulse Width: 0.4 ms
Lead Channel Pacing Threshold Pulse Width: 0.4 ms
Lead Channel Pacing Threshold Pulse Width: 0.4 ms
Lead Channel Setting Pacing Amplitude: 2 V
Lead Channel Setting Pacing Amplitude: 2.5 V
Lead Channel Setting Pacing Amplitude: 2.5 V
Lead Channel Setting Pacing Pulse Width: 0.4 ms
Lead Channel Setting Pacing Pulse Width: 0.4 ms
Lead Channel Setting Sensing Sensitivity: 2.5 mV
Lead Channel Setting Sensing Sensitivity: 2.5 mV
Pulse Gen Serial Number: 747113

## 2020-12-03 ENCOUNTER — Other Ambulatory Visit: Payer: Self-pay | Admitting: Cardiology

## 2020-12-12 NOTE — Progress Notes (Signed)
Remote pacemaker transmission.   

## 2021-02-09 DIAGNOSIS — Z0100 Encounter for examination of eyes and vision without abnormal findings: Secondary | ICD-10-CM | POA: Diagnosis not present

## 2021-02-23 DIAGNOSIS — M169 Osteoarthritis of hip, unspecified: Secondary | ICD-10-CM | POA: Diagnosis not present

## 2021-02-23 DIAGNOSIS — G4700 Insomnia, unspecified: Secondary | ICD-10-CM | POA: Diagnosis not present

## 2021-02-23 DIAGNOSIS — M179 Osteoarthritis of knee, unspecified: Secondary | ICD-10-CM | POA: Diagnosis not present

## 2021-02-23 DIAGNOSIS — E7800 Pure hypercholesterolemia, unspecified: Secondary | ICD-10-CM | POA: Diagnosis not present

## 2021-02-23 DIAGNOSIS — N183 Chronic kidney disease, stage 3 unspecified: Secondary | ICD-10-CM | POA: Diagnosis not present

## 2021-02-27 ENCOUNTER — Ambulatory Visit (INDEPENDENT_AMBULATORY_CARE_PROVIDER_SITE_OTHER): Payer: PPO

## 2021-02-27 DIAGNOSIS — D225 Melanocytic nevi of trunk: Secondary | ICD-10-CM | POA: Diagnosis not present

## 2021-02-27 DIAGNOSIS — L821 Other seborrheic keratosis: Secondary | ICD-10-CM | POA: Diagnosis not present

## 2021-02-27 DIAGNOSIS — L570 Actinic keratosis: Secondary | ICD-10-CM | POA: Diagnosis not present

## 2021-02-27 DIAGNOSIS — I442 Atrioventricular block, complete: Secondary | ICD-10-CM

## 2021-02-27 DIAGNOSIS — D1801 Hemangioma of skin and subcutaneous tissue: Secondary | ICD-10-CM | POA: Diagnosis not present

## 2021-02-27 DIAGNOSIS — D2272 Melanocytic nevi of left lower limb, including hip: Secondary | ICD-10-CM | POA: Diagnosis not present

## 2021-02-27 LAB — CUP PACEART REMOTE DEVICE CHECK
Battery Remaining Longevity: 132 mo
Battery Remaining Percentage: 100 %
Brady Statistic RA Percent Paced: 0 %
Brady Statistic RV Percent Paced: 16 %
Date Time Interrogation Session: 20220628011100
Implantable Lead Implant Date: 20200311
Implantable Lead Implant Date: 20200311
Implantable Lead Implant Date: 20200311
Implantable Lead Location: 753858
Implantable Lead Location: 753859
Implantable Lead Location: 753860
Implantable Lead Model: 4674
Implantable Lead Model: 7741
Implantable Lead Model: 7742
Implantable Lead Serial Number: 1022279
Implantable Lead Serial Number: 1098426
Implantable Lead Serial Number: 830411
Implantable Pulse Generator Implant Date: 20200311
Lead Channel Impedance Value: 660 Ohm
Lead Channel Impedance Value: 747 Ohm
Lead Channel Impedance Value: 937 Ohm
Lead Channel Pacing Threshold Amplitude: 0.5 V
Lead Channel Pacing Threshold Amplitude: 1.1 V
Lead Channel Pacing Threshold Amplitude: 1.7 V
Lead Channel Pacing Threshold Pulse Width: 0.4 ms
Lead Channel Pacing Threshold Pulse Width: 0.4 ms
Lead Channel Pacing Threshold Pulse Width: 0.4 ms
Lead Channel Setting Pacing Amplitude: 2 V
Lead Channel Setting Pacing Amplitude: 2.5 V
Lead Channel Setting Pacing Amplitude: 2.5 V
Lead Channel Setting Pacing Pulse Width: 0.4 ms
Lead Channel Setting Pacing Pulse Width: 0.4 ms
Lead Channel Setting Sensing Sensitivity: 2.5 mV
Lead Channel Setting Sensing Sensitivity: 2.5 mV
Pulse Gen Serial Number: 747113

## 2021-03-19 NOTE — Progress Notes (Signed)
Remote pacemaker transmission.   

## 2021-04-29 NOTE — Progress Notes (Signed)
Cardiology Office Note   Date:  04/30/2021   ID:  [name], [name] [birth year], MRN [id number]  PCP:  [name], MD  Cardiologist:  Peter [name], MD EP: None  Chief Complaint  Patient presents with   Follow-up    CHF, s/p TAVR       History of Present Illness: Brittany Shaffer is a 75 y.o. female with a PMH of chronic combined CHF, AS s/p TAVR, CHB s/p PPM, HLD, who presents for routine follow-up.  Her cardiac history dates back to 2019 when she admitted to the hospital for CP and SOB. She had an echocardiogram which showed EF 50-55%, mild pulmHTN, and moderate aortic stenosis. She underwent a LHC that admission showing near normal coronary arteries with only moderate apical LAD stenosis (50%), mild AS, and moderate MR. She had a repeat echocardiogram in 10/2018 which showed significant drop in EF to 25-30% with restrictive filling parameters, severe pulmHTN, and low flow, low gradient severe aortic stenosis. Repeat cardiac catheterization showed improvement in non-obstructive CAD (30% dLAD stenosis) and moderate-severe low gradient AS with some improvement in EF to 35-45%. She underwent TAVR 0000000 which was complicated by intraprocedureal CHB managed with CRT-P placed by Dr. Lovena Le. Subsequent device monitoring showed paroxysmal atrial fibrillation with burden <1% and all episodes <20 minutes. Her last echocardiogram 11/2019 showed EF 45-50%, RWMA in the basal region, G2DD, sp TAVR with mild per-valvular regurgitation, mild-moderate TR, mild RAE, and moderate LAE.   She was last evaluated by cardiology at an outpatient visit with Dr. [name] 10/2020, at which time she was doing well from a cardiac standpoint. She was noted to be very active, walking and playing tennis and had no anginal complaints. No medication changes occurred and she was recommended to follow-up in 6 months.  She returns today for routine follow-up. She has been doing fairly well since her last visit -  continuing to stay active, mowing her lawn and playing golf/tennis. She has noticed some increased fatigue recently and wonders if part of this is just aging. She has not had any chest pain or SOB. She recently stopped taking her estrogen a couple months ago which may also be contributing to her fatigue as she is not sleeping well, waking up with night sweats. She has not had any palpitations, lightheadedness, syncope, orthopnea, PND, or LE edema. She does have varicose veins. She also mentions an occasional eye twitching/unfocused sensation of her vision which will last for 30 seconds, occurring 2-3x/mo. She has mentioned this to her ophthalmologist as well who she states did not have an explanation. Suggested she circle back with PCP regarding this issue - no other neurologic complaints.     Past Medical History:  Diagnosis Date   Abnormality of gait 10/20/2014   Arthritis    "was in my hips; still in my knees" (01/13/2018)   Bicuspid aortic valve    Chronic combined systolic and diastolic heart failure (HCC)    DJD (degenerative joint disease), ankle and foot 10/20/2014   HAMMER TOE, ACQUIRED 07/28/2007   Qualifier: Diagnosis of  By: Copland MD, Spencer     HLD (hyperlipidemia) 01/14/2018   Metatarsalgia of left foot 10/20/2014   Mild coronary artery disease 01/14/2018   LAD with 50% stenosis on [facility] 12/2017   Mitral regurgitation    OA (osteoarthritis) of hip 03/24/2013   PLANTAR FASCIITIS 07/28/2007   Qualifier: Diagnosis of  By: Lorelei Pont MD, Spencer     RBBB  S/P TAVR (transcatheter aortic valve replacement) 11/10/2018   26 mm Edwards Sapien 3 transcatheter heart valve placed via percutaneous right transfemoral approach    Severe aortic stenosis    Varicose veins of bilateral lower extremities with other complications [id number]    Past Surgical History:  Procedure Laterality Date   ABDOMINAL HYSTERECTOMY  03/03/2012   Procedure: HYSTERECTOMY ABDOMINAL;  Surgeon: Alvino Chapel,  MD;  Location: [facility];  Service: Gynecology;  Laterality: N/A;   BIV PACEMAKER INSERTION CRT-P N/A 11/11/2018   Procedure: BIV PACEMAKER INSERTION CRT-P;  Surgeon: Evans Lance, MD;  Location: [place] [facility];  Service: Cardiovascular;  Laterality: N/A;   BLEPHAROPLASTY Bilateral    JOINT REPLACEMENT     LAPAROTOMY  03/03/2012   Procedure: EXPLORATORY LAPAROTOMY;  Surgeon: Alvino Chapel, MD;  Location: [facility];  Service: Gynecology;  Laterality: N/A; 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   RIGHT/LEFT HEART CATH AND CORONARY ANGIOGRAPHY N/A 01/14/2018   Procedure: RIGHT/LEFT HEART CATH AND CORONARY ANGIOGRAPHY;  Surgeon: Wellington Hampshire, MD;  Location: [place] [facility];  Service: Cardiovascular;  Laterality: N/A;   RIGHT/LEFT HEART CATH AND CORONARY ANGIOGRAPHY N/A 10/23/2018   Procedure: RIGHT/LEFT HEART CATH AND CORONARY ANGIOGRAPHY;  Surgeon: [name], [name], MD;  Location: [place] [facility];  Service: Cardiovascular;  Laterality: N/A;   SALPINGOOPHORECTOMY  03/03/2012   Procedure: SALPINGO OOPHERECTOMY;  Surgeon: Alvino Chapel, MD;  Location: [facility];  Service: Gynecology;  Laterality: Bilateral;   TEE WITHOUT CARDIOVERSION N/A 11/10/2018   Procedure: TRANSESOPHAGEAL ECHOCARDIOGRAM (TEE);  Surgeon: Sherren Mocha, MD;  Location: [place] [facility];  Service: Open Heart Surgery;  Laterality: N/A;   TONSILLECTOMY     TOTAL HIP ARTHROPLASTY Left 03/24/2013   Procedure: LEFT TOTAL HIP ARTHROPLASTY ANTERIOR APPROACH;  Surgeon: [name], MD;  Location: [facility];  Service: Orthopedics;  Laterality: Left;   TOTAL HIP ARTHROPLASTY Right 12/18/2016   Procedure: RIGHT TOTAL HIP ARTHROPLASTY ANTERIOR APPROACH;  Surgeon: Gaynelle Arabian, MD;  Location: [facility];  Service: Orthopedics;  Laterality: Right;   TRANSCATHETER AORTIC VALVE REPLACEMENT, TRANSFEMORAL N/A 11/10/2018   Procedure: TRANSCATHETER AORTIC VALVE REPLACEMENT, TRANSFEMORAL;  Surgeon: Sherren Mocha, MD;  Location: [place]  [facility];  Service: Open Heart Surgery;  Laterality: N/A;   TUBAL LIGATION       Current Outpatient Medications  Medication Sig Dispense Refill   acetaminophen (TYLENOL) 500 MG tablet Take 500-1,000 mg by mouth every 6 (six) hours as needed for moderate pain.     amoxicillin (AMOXIL) 500 MG tablet Take 4 tablets (2,000 mg total) by mouth as directed. 1 hour prior to dental work including cleanings 12 tablet 12   ASPIRIN LOW DOSE 81 MG EC tablet TAKE 1 TABLET BY MOUTH EVERY DAY 90 tablet 3   diclofenac sodium (VOLTAREN) 1 % GEL Apply 1 application topically 4 (four) times daily as needed (pain).     ibuprofen (ADVIL) 200 MG tablet Take 200 mg by mouth every 6 (six) hours as needed.     Menthol-Methyl Salicylate (MUSCLE RUB EX) Apply 1 application topically daily as needed (muscle pain).     UNABLE TO FIND Hyland's Leg Cramps tablets: Take 1 tablet by mouth during the night as needed for leg cramps     zolpidem (AMBIEN) 10 MG tablet Take 1 tablet (10 mg total) by mouth at bedtime as needed. For sleep 30 tablet 1   estradiol (CLIMARA - DOSED IN MG/24 HR) 0.025 mg/24hr patch PLACE 1  PATCH (0.025 MG TOTAL) ONTO THE SKIN ONCE A WEEK. (Patient not taking: Reported on 04/30/2021) 12 patch 4   No current facility-administered medications for this visit.    Allergies:   Codeine and Plavix [clopidogrel bisulfate]    Social History:  The patient  reports that she has never smoked. She has never used smokeless tobacco. She reports current alcohol use of about 7.0 standard drinks per week. She reports that she does not use drugs.   Family History:  The patient's family history includes Bone cancer in her father; Breast cancer in her maternal aunt; Diabetes in her maternal aunt; Hypertension in her mother; Lung cancer in her father; Ovarian cancer in her sister.    ROS:  Please see the history of present illness.   Otherwise, review of systems are positive for none.   All other systems are reviewed and  negative.    PHYSICAL EXAM: VS:  BP 130/80 (BP Location: Left Arm)   Pulse 65   Ht 5' 6.5" (1.689 m)   Wt 157 lb 6.4 oz (71.4 kg)   SpO2 96%   BMI 25.02 kg/m  , BMI Body mass index is 25.02 kg/m. GEN: Well nourished, well developed, in no acute distress HEENT: normal Neck: no JVD, carotid bruits, or masses Cardiac: RRR; no murmurs, rubs, or gallops,no edema  Respiratory:  clear to auscultation bilaterally, normal work of breathing GI: soft, nontender, nondistended, + BS MS: no deformity or atrophy Skin: warm and dry, no rash Neuro:  Strength and sensation are intact Psych: euthymic mood, full affect   EKG:  EKG is not ordered today.   Recent Labs: No results found for requested labs within last 8760 hours.    Lipid Panel    Component Value Date/Time   CHOL 193 01/14/2018 0212   TRIG 55 01/14/2018 0212   HDL 85 01/14/2018 0212   CHOLHDL 2.3 01/14/2018 0212   VLDL 11 01/14/2018 0212   LDLCALC 97 01/14/2018 0212      Wt Readings from Last 3 Encounters:  04/30/21 157 lb 6.4 oz (71.4 kg)  10/30/20 159 lb 9.6 oz (72.4 kg)  04/27/20 155 lb 6.4 oz (70.5 kg)      Other studies Reviewed: Additional studies/ records that were reviewed today include:   Echocardiogram 11/2019: 1. Left ventricular ejection fraction, by estimation, is 45 to 50%. The  left ventricle has mildly decreased function. The left ventricle  demonstrates regional wall motion abnormalities. Basal anterolateral,  basal inferolateral, and basal inferior severe   hypokinesis. Left ventricular diastolic parameters are consistent with  Grade II diastolic dysfunction (pseudonormalization).   2. Right ventricular systolic function is normal. The right ventricular  size is mildly enlarged. There is normal pulmonary artery systolic  pressure. The estimated right ventricular systolic pressure is [id number] mmHg.   3. 26 mm Edwards Sapien bioprosthetic aortic valve s/p TAVR. Mild,  eccentric peri-valvular  regurgitation. Mean gradient 15 mmHg with  calculated AVA 1.04 cm^2.   4. The mitral valve is normal in structure. Trivial mitral valve  regurgitation. No evidence of mitral stenosis.   5. Tricuspid valve regurgitation is mild to moderate.   6. Right atrial size was mildly dilated.   7. Left atrial size was moderately dilated.   8. The inferior vena cava is normal in size with greater than 50%  respiratory variability, suggesting right atrial pressure of 3 mmHg.   R/LHC 10/2018: Dist LAD lesion is 30% stenosed. There is moderate left ventricular systolic dysfunction. The  left ventricular ejection fraction is 35-45% by visual estimate. LV end diastolic pressure is normal. There is moderate aortic valve stenosis.   1. Minimal nonobstructive CAD 2. Moderate LV dysfunction. EF 35-40%. 3. At least moderate AS. Mean gradient 27 mm Hg. AVA may be underestimated due to decreased LV function 4. Normal LV filling pressures. 5. Normal right heart pressures.   Plan: will refer to Valvular heart team for consideration of TAVR.   ASSESSMENT AND PLAN:  1. Chronic combined CHF/NICM: no volume overload complaints and she appears euvolemic on exam. Ultimately unable to tolerate GDMT due to hypotension - Continue to encourage monitoring daily weights and limiting salt intake - Will update an echocardiogram to ensure there has not been a change in her LV function to explain recent fatigue  2. Non-obstructive CAD: mild distal LAD disease noted on [facility] 10/2018. No recent anginal complaints. She continues to be active - Continue aggressive risk factor modifications to meet goal BP <130/80, LDL <70, A1C <7  3. Severe aortic stenosis s/p TAVR 11/2018: stable on last echo 11/2019 with mild perivalvular leak noted.  - Will update an echocardiogram for routine monitoring post-TAVR - Continue aspirin - she had not been taking for the past 2-3 months and was encouraged to restart. - Continue SBE ppx with  amoxicillin  4. CHB s/p PPM: normal device function without abnormalities noted on last device interrogation 01/2021 - Continue routine device monitoring per Dr. Lovena Le  5. HLD: LDL 132 10/2020. She is not interested in starting statins. She does report being a [employer] and likes to use fatback in recipes.  - Continue lifestyle and dietary modifications to promote lower cholesterol - will give information on dietary recommendations to lower cholesterol - Suggested addition of zetia if LDL remains above goal on next lipid check   Current medicines are reviewed at length with the patient today.  The patient does not have concerns regarding medicines.  The following changes have been made:  As above  Labs/ tests ordered today include:   Orders Placed This Encounter  Procedures   ECHOCARDIOGRAM COMPLETE      Disposition:   FU with Dr. [name] in 6 months  Signed, Abigail Butts, PA-C  04/30/2021 2:54 PM

## 2021-04-30 ENCOUNTER — Ambulatory Visit: Payer: PPO | Admitting: Medical

## 2021-04-30 ENCOUNTER — Encounter: Payer: Self-pay | Admitting: Medical

## 2021-04-30 ENCOUNTER — Other Ambulatory Visit: Payer: Self-pay

## 2021-04-30 VITALS — BP 130/80 | HR 65 | Ht 66.5 in | Wt 157.4 lb

## 2021-04-30 DIAGNOSIS — I5042 Chronic combined systolic (congestive) and diastolic (congestive) heart failure: Secondary | ICD-10-CM

## 2021-04-30 DIAGNOSIS — I428 Other cardiomyopathies: Secondary | ICD-10-CM

## 2021-04-30 DIAGNOSIS — Z952 Presence of prosthetic heart valve: Secondary | ICD-10-CM

## 2021-04-30 DIAGNOSIS — I2510 Atherosclerotic heart disease of native coronary artery without angina pectoris: Secondary | ICD-10-CM

## 2021-04-30 DIAGNOSIS — I442 Atrioventricular block, complete: Secondary | ICD-10-CM | POA: Diagnosis not present

## 2021-04-30 DIAGNOSIS — Z950 Presence of cardiac pacemaker: Secondary | ICD-10-CM

## 2021-04-30 DIAGNOSIS — E785 Hyperlipidemia, unspecified: Secondary | ICD-10-CM | POA: Diagnosis not present

## 2021-04-30 DIAGNOSIS — I350 Nonrheumatic aortic (valve) stenosis: Secondary | ICD-10-CM | POA: Diagnosis not present

## 2021-04-30 NOTE — Patient Instructions (Signed)
Medication Instructions:  Restart Aspirin 81 mg (1 Tablet Daily). Consider Taking Zetia of cholesterol continues to be elevated. *If you need a refill on your cardiac medications before your next appointment, please call your pharmacy*   Lab Work: No Labs If you have labs (blood work) drawn today and your tests are completely normal, you will receive your results only by: [place] (if you have MyChart) OR A paper copy in the mail If you have any lab test that is abnormal or we need to change your treatment, we will call you to review the results.   Testing/Procedures: [address] [place] has requested that you have an echocardiogram. Echocardiography is a painless test that uses sound waves to create images of your heart. It provides your doctor with information about the size and shape of your heart and how well your heart's chambers and valves are working. This procedure takes approximately one hour. There are no restrictions for this procedure.    Follow-Up: At [facility], you and your health needs are our priority.  As part of our continuing mission to provide you with exceptional heart care, we have created designated Provider Care Teams.  These Care Teams include your primary Cardiologist (physician) and Advanced Practice Providers (APPs -  Physician Assistants and Nurse Practitioners) who all work together to provide you with the care you need, when you need it.   Your next appointment:   6 month(s)  The format for your next appointment:   In Person  Provider:   Peter [name], MD   Other Instructions Heart-Healthy Eating Plan Many factors influence your heart (coronary) health, including eating and exercise habits. Coronary risk increases with abnormal blood fat (lipid) levels. Heart-healthy meal planning includes limiting unhealthy fats,increasing healthy fats, and making other diet and lifestyle changes. What is my plan? Your health  care provider may recommend that you: Limit your fat intake to _________% or less of your total calories each day. Limit your saturated fat intake to _________% or less of your total calories each day. Limit the amount of cholesterol in your diet to less than _________ mg per day. What are tips for following this plan? Cooking Cook foods using methods other than frying. Baking, boiling, grilling, and broiling are all good options. Other ways to reduce fat include: Removing the skin from poultry. Removing all visible fats from meats. Steaming vegetables in water or broth. Meal planning  At meals, imagine dividing your plate into fourths: Fill one-half of your plate with vegetables and green salads. Fill one-fourth of your plate with whole grains. Fill one-fourth of your plate with lean protein foods. Eat 4-5 servings of vegetables per day. One serving equals 1 cup raw or cooked vegetable, or 2 cups raw leafy greens. Eat 4-5 servings of fruit per day. One serving equals 1 medium whole fruit,  cup dried fruit,  cup fresh, frozen, or canned fruit, or  cup 100% fruit juice. Eat more foods that contain soluble fiber. Examples include apples, broccoli, carrots, beans, peas, and barley. Aim to get 25-30 g of fiber per day. Increase your consumption of legumes, nuts, and seeds to 4-5 servings per week. One serving of dried beans or legumes equals  cup cooked, 1 serving of nuts is  cup, and 1 serving of seeds equals 1 tablespoon.  Fats Choose healthy fats more often. Choose monounsaturated and polyunsaturated fats, such as olive and canola oils, flaxseeds, walnuts, almonds, and seeds. Eat more omega-3 fats.  Choose salmon, mackerel, sardines, tuna, flaxseed oil, and ground flaxseeds. Aim to eat fish at least 2 times each week. Check food labels carefully to identify foods with trans fats or high amounts of saturated fat. Limit saturated fats. These are found in animal products, such as meats,  butter, and cream. Plant sources of saturated fats include palm oil, palm kernel oil, and coconut oil. Avoid foods with partially hydrogenated oils in them. These contain trans fats. Examples are stick margarine, some tub margarines, cookies, crackers, and other baked goods. Avoid fried foods. General information Eat more home-cooked food and less restaurant, buffet, and fast food. Limit or avoid alcohol. Limit foods that are high in starch and sugar. Lose weight if you are overweight. Losing just 5-10% of your body weight can help your overall health and prevent diseases such as diabetes and heart disease. Monitor your salt (sodium) intake, especially if you have high blood pressure. Talk with your health care provider about your sodium intake. Try to incorporate more vegetarian meals weekly. What foods can I eat? Fruits All fresh, canned (in natural juice), or frozen fruits. Vegetables Fresh or frozen vegetables (raw, steamed, roasted, or grilled). Green salads. Grains Most grains. Choose whole wheat and whole grains most of the time. Rice andpasta, including brown rice and pastas made with whole wheat. Meats and other proteins Lean, well-trimmed beef, veal, pork, and lamb. Chicken and [place] without skin. All fish and shellfish. Wild duck, rabbit, pheasant, and venison. Egg whites or low-cholesterol egg substitutes. Dried beans, peas, lentils, and tofu. Seedsand most nuts. Dairy Low-fat or nonfat cheeses, including ricotta and mozzarella. Skim or 1% milk (liquid, powdered, or evaporated). Buttermilk made with low-fat milk. Nonfat orlow-fat yogurt. Fats and oils Non-hydrogenated (trans-free) margarines. Vegetable oils, including soybean, sesame, sunflower, olive, peanut, safflower, corn, canola, and cottonseed. Salad dressings or mayonnaisemade with a vegetable oil. Beverages Water (mineral or sparkling). Coffee and tea. Diet carbonated beverages. Sweets and desserts Sherbet, gelatin, and  fruit ice. Small amounts of dark chocolate. Limit all sweets and desserts. Seasonings and condiments All seasonings and condiments. The items listed above may not be a complete list of foods and beverages you can eat. Contact a dietitian for more options. What foods are not recommended? Fruits Canned fruit in heavy syrup. Fruit in cream or butter sauce. Fried fruit. Limitcoconut. Vegetables Vegetables cooked in cheese, cream, or butter sauce. Fried vegetables. Grains Breads made with saturated or trans fats, oils, or whole milk. Croissants. Sweet rolls. Donuts. High-fat crackers,such as cheese crackers. Meats and other proteins Fatty meats, such as hot dogs, ribs, sausage, bacon, rib-eye roast or steak. High-fat deli meats, such as salami and bologna. Caviar. Domestic duck andgoose. Organ meats, such as liver. Dairy Cream, sour cream, cream cheese, and creamed cottage cheese. Whole milk cheeses. Whole or 2% milk (liquid, evaporated, or condensed). Whole buttermilk.Cream sauce or high-fat cheese sauce. Whole-milk yogurt. Fats and oils Meat fat, or shortening. Cocoa butter, hydrogenated oils, palm oil, coconut oil, palm kernel oil. Solid fats and shortenings, including bacon fat, salt pork, lard, and butter. Nondairy cream substitutes. Salad dressings with cheeseor sour cream. Beverages Regular sodas and any drinks with added sugar. Sweets and desserts Frosting. Pudding. Cookies. Cakes. Pies. Milk chocolate or white chocolate.Buttered syrups. Full-fat ice cream or ice cream drinks. The items listed above may not be a complete list of foods and beverages to avoid. Contact a dietitian for more information. Summary Heart-healthy meal planning includes limiting unhealthy fats, increasing healthy fats, and making  other diet and lifestyle changes. Lose weight if you are overweight. Losing just 5-10% of your body weight can help your overall health and prevent diseases such as diabetes and heart  disease. Focus on eating a balance of foods, including fruits and vegetables, low-fat or nonfat dairy, lean protein, nuts and legumes, whole grains, and heart-healthy oils and fats. This information is not intended to replace advice given to you by your health care provider. Make sure you discuss any questions you have with your healthcare provider. Document Revised: 09/26/2017 Document Reviewed: 09/26/2017 Elsevier Patient Education  2022 [employer].

## 2021-05-08 ENCOUNTER — Other Ambulatory Visit: Payer: Self-pay

## 2021-05-08 ENCOUNTER — Ambulatory Visit: Payer: PPO | Admitting: Internal Medicine

## 2021-05-08 VITALS — BP 138/74 | Ht 66.5 in | Wt 159.2 lb

## 2021-05-08 DIAGNOSIS — I428 Other cardiomyopathies: Secondary | ICD-10-CM | POA: Diagnosis not present

## 2021-05-08 DIAGNOSIS — Z950 Presence of cardiac pacemaker: Secondary | ICD-10-CM | POA: Diagnosis not present

## 2021-05-08 DIAGNOSIS — I442 Atrioventricular block, complete: Secondary | ICD-10-CM | POA: Diagnosis not present

## 2021-05-08 NOTE — Patient Instructions (Signed)
Medication Instructions:  Your physician recommends that you continue on your current medications as directed. Please refer to the Current Medication list given to you today.  Labwork: None ordered.  Testing/Procedures: None ordered.  Follow-Up: Your physician wants you to follow-up in: one year with Cristopher Peru, MD or one of the following Advanced Practice Providers on your designated Care Team:   Tommye Standard, [place] Legrand Como "Jonni Sanger" Chalmers Cater, [place]  Remote monitoring is used to monitor your Pacemaker from home. This monitoring reduces the number of office visits required to check your device to one time per year. It allows [place] to keep an eye on the functioning of your device to ensure it is working properly. You are scheduled for a device check from home on 05/29/2021. You may send your transmission at any time that day. If you have a wireless device, the transmission will be sent automatically. After your physician reviews your transmission, you will receive a postcard with your next transmission date.  Any Other Special Instructions Will Be Listed Below (If Applicable).  If you need a refill on your cardiac medications before your next appointment, please call your pharmacy.

## 2021-05-08 NOTE — Progress Notes (Signed)
HPI Brittany Shaffer returns today for followup. She is a pleasant 75 yo woman with AS, s/p TAVR, complicated by CHB. She has been stable in the interim. She is playing tennis and golf without limit. She denies chest pain or sob. Minimal peripheral edema. No syncope.  Allergies  Allergen Reactions   Codeine Other (See Comments)    Hallucinations   Plavix [Clopidogrel Bisulfate] Rash     Current Outpatient Medications  Medication Sig Dispense Refill   acetaminophen (TYLENOL) 500 MG tablet Take 500-1,000 mg by mouth every 6 (six) hours as needed for moderate pain.     amoxicillin (AMOXIL) 500 MG tablet Take 4 tablets (2,000 mg total) by mouth as directed. 1 hour prior to dental work including cleanings 12 tablet 12   ASPIRIN LOW DOSE 81 MG EC tablet TAKE 1 TABLET BY MOUTH EVERY DAY 90 tablet 3   diclofenac sodium (VOLTAREN) 1 % GEL Apply 1 application topically 4 (four) times daily as needed (pain).     ibuprofen (ADVIL) 200 MG tablet Take 200 mg by mouth every 6 (six) hours as needed.     Menthol-Methyl Salicylate (MUSCLE RUB EX) Apply 1 application topically daily as needed (muscle pain).     UNABLE TO FIND Hyland's Leg Cramps tablets: Take 1 tablet by mouth during the night as needed for leg cramps     zolpidem (AMBIEN) 10 MG tablet Take 1 tablet (10 mg total) by mouth at bedtime as needed. For sleep 30 tablet 1   estradiol (CLIMARA - DOSED IN MG/24 HR) 0.025 mg/24hr patch PLACE 1 PATCH (0.025 MG TOTAL) ONTO THE SKIN ONCE A WEEK. (Patient not taking: Reported on 05/08/2021) 12 patch 4   No current facility-administered medications for this visit.     Past Medical History:  Diagnosis Date   Abnormality of gait 10/20/2014   Arthritis    "was in my hips; still in my knees" (01/13/2018)   Bicuspid aortic valve    Chronic combined systolic and diastolic heart failure (HCC)    DJD (degenerative joint disease), ankle and foot 10/20/2014   HAMMER TOE, ACQUIRED 07/28/2007   Qualifier:  Diagnosis of  By: Copland MD, Spencer     HLD (hyperlipidemia) 01/14/2018   Metatarsalgia of left foot 10/20/2014   Mild coronary artery disease 01/14/2018   LAD with 50% stenosis on LHC 12/2017   Mitral regurgitation    OA (osteoarthritis) of hip 03/24/2013   PLANTAR FASCIITIS 07/28/2007   Qualifier: Diagnosis of  By: Lorelei Pont MD, Spencer     RBBB    S/P TAVR (transcatheter aortic valve replacement) 11/10/2018   26 mm Edwards Sapien 3 transcatheter heart valve placed via percutaneous right transfemoral approach    Severe aortic stenosis    Varicose veins of bilateral lower extremities with other complications [id number]    ROS:   All systems reviewed and negative except as noted in the HPI.   Past Surgical History:  Procedure Laterality Date   ABDOMINAL HYSTERECTOMY  03/03/2012   Procedure: HYSTERECTOMY ABDOMINAL;  Surgeon: Alvino Chapel, MD;  [facility];  Service: Gynecology;  Laterality: N/A;   BIV PACEMAKER INSERTION CRT-P N/A 11/11/2018   Procedure: BIV PACEMAKER INSERTION CRT-P;  Surgeon: Evans Lance, MD;  Location: [place] [facility];  Service: Cardiovascular;  Laterality: N/A;   BLEPHAROPLASTY Bilateral    JOINT REPLACEMENT     LAPAROTOMY  03/03/2012   Procedure: EXPLORATORY LAPAROTOMY;  Surgeon: Alvino Chapel, MD;  Location:  [facility];  Service: Gynecology;  Laterality: N/A; 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   RIGHT/LEFT HEART CATH AND CORONARY ANGIOGRAPHY N/A 01/14/2018   Procedure: RIGHT/LEFT HEART CATH AND CORONARY ANGIOGRAPHY;  Surgeon: Wellington Hampshire, MD;  Location: [place] [facility];  Service: Cardiovascular;  Laterality: N/A;   RIGHT/LEFT HEART CATH AND CORONARY ANGIOGRAPHY N/A 10/23/2018   Procedure: RIGHT/LEFT HEART CATH AND CORONARY ANGIOGRAPHY;  Surgeon: [name], [name], MD;  Location: [place] [facility];  Service: Cardiovascular;  Laterality: N/A;   SALPINGOOPHORECTOMY  03/03/2012   Procedure: SALPINGO OOPHERECTOMY;  Surgeon: Alvino Chapel, MD;  [facility];  Service: Gynecology;  Laterality: Bilateral;   TEE WITHOUT CARDIOVERSION N/A 11/10/2018   Procedure: TRANSESOPHAGEAL ECHOCARDIOGRAM (TEE);  Surgeon: Sherren Mocha, MD;  Location: [place] [facility];  Service: Open Heart Surgery;  Laterality: N/A;   TONSILLECTOMY     TOTAL HIP ARTHROPLASTY Left 03/24/2013   Procedure: LEFT TOTAL HIP ARTHROPLASTY ANTERIOR APPROACH;  Surgeon: [name], MD;  [facility];  Service: Orthopedics;  Laterality: Left;   TOTAL HIP ARTHROPLASTY Right 12/18/2016   Procedure: RIGHT TOTAL HIP ARTHROPLASTY ANTERIOR APPROACH;  Surgeon: Gaynelle Arabian, MD;  [facility];  Service: Orthopedics;  Laterality: Right;   TRANSCATHETER AORTIC VALVE REPLACEMENT, TRANSFEMORAL N/A 11/10/2018   Procedure: TRANSCATHETER AORTIC VALVE REPLACEMENT, TRANSFEMORAL;  Surgeon: Sherren Mocha, MD;  Location: [place] [facility];  Service: Open Heart Surgery;  Laterality: N/A;   TUBAL LIGATION       Family History  Problem Relation Age of Onset   Hypertension Mother    Bone cancer Father    Lung cancer Father    Ovarian cancer Sister    Diabetes Maternal Aunt    Breast cancer Maternal Aunt        Age 65's     Social History   Socioeconomic History   Marital status: Widowed    Spouse name: Not on file   Number of children: Not on file   Years of education: Not on file   Highest education level: Not on file  Occupational History   Not on file  Tobacco Use   Smoking status: Never   Smokeless tobacco: Never  Vaping Use   Vaping Use: Never used  Substance and Sexual Activity   Alcohol use: Yes    Alcohol/week: 7.0 standard drinks    Types: 7 Standard drinks or equivalent per week    Comment: 1 / PER DAY   Drug use: No   Sexual activity: Not Currently    Birth control/protection: Surgical    Comment: 1st intercourse 75 yo-Fewer than 5 partners  Other Topics Concern   Not on file  Social History Narrative   Lives locally.   Retired. Active - walks frequently and plays tennis regularly.   Social Determinants of Health   Financial Resource Strain: Not on file  Food Insecurity: Not on file  Transportation Needs: Not on file  Physical Activity: Not on file  Stress: Not on file  Social Connections: Not on file  Intimate Partner Violence: Not on file     BP 138/74   Ht 5' 6.5" (1.689 m)   Wt 159 lb 3.2 oz (72.2 kg)   SpO2 95%   BMI 25.31 kg/m   Physical Exam:  Well appearing NAD HEENT: Unremarkable Neck:  No JVD, no thyromegally Lymphatics:  No adenopathy Back:  No CVA tenderness Lungs:  Clear with no wheezes HEART:  Regular rate rhythm, no murmurs, no  rubs, no clicks Abd:  soft, positive bowel sounds, no organomegally, no rebound, no guarding Ext:  2 plus pulses, no edema, no cyanosis, no clubbing Skin:  No rashes no nodules Neuro:  CN II through XII intact, motor grossly intact  DEVICE  Normal device function.  See PaceArt for details.   Assess/Plan:  1. CHB - she has had return of her AV conduction. She is pacing only 10% 2. PPM - her [employer] DDD PM is working normally. We will recheck in several months. 3. AS - she is asymptomatic, s/p TAVR.   Brittany Shaffer.

## 2021-05-10 DIAGNOSIS — G4700 Insomnia, unspecified: Secondary | ICD-10-CM | POA: Diagnosis not present

## 2021-05-10 DIAGNOSIS — R030 Elevated blood-pressure reading, without diagnosis of hypertension: Secondary | ICD-10-CM | POA: Diagnosis not present

## 2021-05-10 DIAGNOSIS — N183 Chronic kidney disease, stage 3 unspecified: Secondary | ICD-10-CM | POA: Diagnosis not present

## 2021-05-10 DIAGNOSIS — N959 Unspecified menopausal and perimenopausal disorder: Secondary | ICD-10-CM | POA: Diagnosis not present

## 2021-05-17 DIAGNOSIS — Z1231 Encounter for screening mammogram for malignant neoplasm of breast: Secondary | ICD-10-CM | POA: Diagnosis not present

## 2021-05-22 DIAGNOSIS — R197 Diarrhea, unspecified: Secondary | ICD-10-CM | POA: Diagnosis not present

## 2021-05-29 ENCOUNTER — Ambulatory Visit (INDEPENDENT_AMBULATORY_CARE_PROVIDER_SITE_OTHER): Payer: PPO

## 2021-05-29 DIAGNOSIS — M179 Osteoarthritis of knee, unspecified: Secondary | ICD-10-CM | POA: Diagnosis not present

## 2021-05-29 DIAGNOSIS — I442 Atrioventricular block, complete: Secondary | ICD-10-CM

## 2021-05-29 DIAGNOSIS — G4700 Insomnia, unspecified: Secondary | ICD-10-CM | POA: Diagnosis not present

## 2021-05-29 DIAGNOSIS — N183 Chronic kidney disease, stage 3 unspecified: Secondary | ICD-10-CM | POA: Diagnosis not present

## 2021-05-29 DIAGNOSIS — M169 Osteoarthritis of hip, unspecified: Secondary | ICD-10-CM | POA: Diagnosis not present

## 2021-05-29 DIAGNOSIS — E7800 Pure hypercholesterolemia, unspecified: Secondary | ICD-10-CM | POA: Diagnosis not present

## 2021-05-29 LAB — CUP PACEART REMOTE DEVICE CHECK
Battery Remaining Longevity: 132 mo
Battery Remaining Percentage: 100 %
Brady Statistic RA Percent Paced: 0 %
Brady Statistic RV Percent Paced: 14 %
Date Time Interrogation Session: 20220927011100
Implantable Lead Implant Date: 20200311
Implantable Lead Implant Date: 20200311
Implantable Lead Implant Date: 20200311
Implantable Lead Location: 753858
Implantable Lead Location: 753859
Implantable Lead Location: 753860
Implantable Lead Model: 4674
Implantable Lead Model: 7741
Implantable Lead Model: 7742
Implantable Lead Serial Number: 1022279
Implantable Lead Serial Number: 1098426
Implantable Lead Serial Number: 830411
Implantable Pulse Generator Implant Date: 20200311
Lead Channel Impedance Value: 662 Ohm
Lead Channel Impedance Value: 684 Ohm
Lead Channel Impedance Value: 951 Ohm
Lead Channel Pacing Threshold Amplitude: 0.5 V
Lead Channel Pacing Threshold Amplitude: 0.9 V
Lead Channel Pacing Threshold Amplitude: 1.6 V
Lead Channel Pacing Threshold Pulse Width: 0.4 ms
Lead Channel Pacing Threshold Pulse Width: 0.4 ms
Lead Channel Pacing Threshold Pulse Width: 0.4 ms
Lead Channel Setting Pacing Amplitude: 2 V
Lead Channel Setting Pacing Amplitude: 2.5 V
Lead Channel Setting Pacing Amplitude: 2.5 V
Lead Channel Setting Pacing Pulse Width: 0.4 ms
Lead Channel Setting Pacing Pulse Width: 0.4 ms
Lead Channel Setting Sensing Sensitivity: 2.5 mV
Lead Channel Setting Sensing Sensitivity: 2.5 mV
Pulse Gen Serial Number: 747113

## 2021-05-31 ENCOUNTER — Ambulatory Visit (HOSPITAL_COMMUNITY): Payer: PPO | Attending: Cardiology

## 2021-05-31 ENCOUNTER — Other Ambulatory Visit: Payer: Self-pay

## 2021-05-31 DIAGNOSIS — E785 Hyperlipidemia, unspecified: Secondary | ICD-10-CM | POA: Insufficient documentation

## 2021-05-31 DIAGNOSIS — I350 Nonrheumatic aortic (valve) stenosis: Secondary | ICD-10-CM | POA: Insufficient documentation

## 2021-05-31 DIAGNOSIS — I428 Other cardiomyopathies: Secondary | ICD-10-CM | POA: Diagnosis not present

## 2021-05-31 DIAGNOSIS — Z952 Presence of prosthetic heart valve: Secondary | ICD-10-CM | POA: Diagnosis not present

## 2021-05-31 DIAGNOSIS — I5042 Chronic combined systolic (congestive) and diastolic (congestive) heart failure: Secondary | ICD-10-CM | POA: Insufficient documentation

## 2021-05-31 DIAGNOSIS — I442 Atrioventricular block, complete: Secondary | ICD-10-CM

## 2021-05-31 DIAGNOSIS — I2510 Atherosclerotic heart disease of native coronary artery without angina pectoris: Secondary | ICD-10-CM | POA: Diagnosis not present

## 2021-05-31 DIAGNOSIS — Z950 Presence of cardiac pacemaker: Secondary | ICD-10-CM

## 2021-05-31 LAB — ECHOCARDIOGRAM COMPLETE
AV Mean grad: 10.8 mmHg
AV Peak grad: 19.1 mmHg
Ao pk vel: 2.19 m/s
Area-P 1/2: 3.85 cm2
P 1/2 time: 969 msec
S' Lateral: 3.7 cm

## 2021-06-05 NOTE — Progress Notes (Signed)
Remote pacemaker transmission.   

## 2021-07-11 DIAGNOSIS — H40013 Open angle with borderline findings, low risk, bilateral: Secondary | ICD-10-CM | POA: Diagnosis not present

## 2021-07-11 DIAGNOSIS — H25812 Combined forms of age-related cataract, left eye: Secondary | ICD-10-CM | POA: Diagnosis not present

## 2021-07-11 DIAGNOSIS — H43391 Other vitreous opacities, right eye: Secondary | ICD-10-CM | POA: Diagnosis not present

## 2021-07-11 DIAGNOSIS — Z961 Presence of intraocular lens: Secondary | ICD-10-CM | POA: Diagnosis not present

## 2021-07-31 DIAGNOSIS — Z952 Presence of prosthetic heart valve: Secondary | ICD-10-CM | POA: Diagnosis not present

## 2021-07-31 DIAGNOSIS — Z950 Presence of cardiac pacemaker: Secondary | ICD-10-CM | POA: Diagnosis not present

## 2021-07-31 DIAGNOSIS — R051 Acute cough: Secondary | ICD-10-CM | POA: Diagnosis not present

## 2021-08-01 ENCOUNTER — Telehealth: Payer: Self-pay | Admitting: Cardiology

## 2021-08-01 NOTE — Telephone Encounter (Signed)
Spoke to patient Dr.Jordan's advice given. 

## 2021-08-01 NOTE — Telephone Encounter (Signed)
Returned call to patient-patient reports having a cough starting Saturday.  Reports only having a cough, no congestion, no fever.  Reports extreme fatigue, reports when she walks from room to room she becomes exhausted.  Some SOB with exertion, denies CP.  No swelling.   She did a televisit with PCP yesterday and was prescribed tesselon pearles which helped her cough last night but cannot take during the day due to making her dizzy.   She states her home covid test was negative.    She also feels like her heart is working "overtime".  Describes this as having palpitations when moving around.   She has tried nyquil, dayquil, advil cold and sinus, tylenol severe cold, robitussin, benedryl without relief.  She is drinking hot tea and eating soup.   She has no appetite.  She is wondering if there is any concern from a cardiac standpoint and anything additional she could take that is recommended with her current condition and medication.    Advised would send to Dr. [name] to review but would recommend follow up with PCP if symptoms continue or worsen.   Patient aware and verbalized understanding.

## 2021-08-01 NOTE — Telephone Encounter (Signed)
I would be concerned that this is more infectious. Doubt cardiac. She should follow up with PCP first  Brettany Sydney [name] MD, [facility]

## 2021-08-01 NOTE — Telephone Encounter (Signed)
Patient called and said she has absolutely no energy. She gets wiped out just walking from room to room. She wanted to know if there was anything Dr. [name] would recommend to help her energy come back. She also has no appetite

## 2021-08-28 ENCOUNTER — Ambulatory Visit (INDEPENDENT_AMBULATORY_CARE_PROVIDER_SITE_OTHER): Payer: PPO

## 2021-08-28 DIAGNOSIS — I442 Atrioventricular block, complete: Secondary | ICD-10-CM

## 2021-08-28 LAB — CUP PACEART REMOTE DEVICE CHECK
Battery Remaining Longevity: 132 mo
Battery Remaining Percentage: 100 %
Brady Statistic RA Percent Paced: 0 %
Brady Statistic RV Percent Paced: 28 %
Date Time Interrogation Session: 20221227013200
Implantable Lead Implant Date: 20200311
Implantable Lead Implant Date: 20200311
Implantable Lead Implant Date: 20200311
Implantable Lead Location: 753858
Implantable Lead Location: 753859
Implantable Lead Location: 753860
Implantable Lead Model: 4674
Implantable Lead Model: 7741
Implantable Lead Model: 7742
Implantable Lead Serial Number: 1022279
Implantable Lead Serial Number: 1098426
Implantable Lead Serial Number: 830411
Implantable Pulse Generator Implant Date: 20200311
Lead Channel Impedance Value: 667 Ohm
Lead Channel Impedance Value: 786 Ohm
Lead Channel Impedance Value: 925 Ohm
Lead Channel Pacing Threshold Amplitude: 0.5 V
Lead Channel Pacing Threshold Amplitude: 1 V
Lead Channel Pacing Threshold Amplitude: 1.6 V
Lead Channel Pacing Threshold Pulse Width: 0.4 ms
Lead Channel Pacing Threshold Pulse Width: 0.4 ms
Lead Channel Pacing Threshold Pulse Width: 0.4 ms
Lead Channel Setting Pacing Amplitude: 2 V
Lead Channel Setting Pacing Amplitude: 2.5 V
Lead Channel Setting Pacing Amplitude: 2.5 V
Lead Channel Setting Pacing Pulse Width: 0.4 ms
Lead Channel Setting Pacing Pulse Width: 0.4 ms
Lead Channel Setting Sensing Sensitivity: 2.5 mV
Lead Channel Setting Sensing Sensitivity: 2.5 mV
Pulse Gen Serial Number: 747113

## 2021-08-29 DIAGNOSIS — M25561 Pain in right knee: Secondary | ICD-10-CM | POA: Diagnosis not present

## 2021-08-29 DIAGNOSIS — M25562 Pain in left knee: Secondary | ICD-10-CM | POA: Diagnosis not present

## 2021-09-05 NOTE — Progress Notes (Signed)
Remote pacemaker transmission.   

## 2021-10-04 DIAGNOSIS — M170 Bilateral primary osteoarthritis of knee: Secondary | ICD-10-CM | POA: Diagnosis not present

## 2021-10-06 NOTE — Progress Notes (Signed)
Cardiology Office Note   Date:  10/08/2021   ID:  Charlye, Spare [birth year], MRN [id number]  PCP:  [name], MD  Cardiologist:  Trooper Olander [name], MD EP: None  Chief Complaint  Patient presents with   tavr       History of Present Illness: Brittany Shaffer is a 76 y.o. female with a PMH of chronic combined CHF, AS s/p TAVR, CHB s/p PPM, HLD, who presents for  follow-up.  In 2019  she was admitted  for CP and SOB. She had an echocardiogram which showed EF 50-55%, mild pulmHTN, and moderate aortic stenosis. She underwent a LHC that admission showing near normal coronary arteries with only moderate apical LAD stenosis (50%), mild AS, and moderate MR. She had a repeat echocardiogram in 10/2018 which showed significant drop in EF to 25-30% with restrictive filling parameters, severe pulmHTN, and low flow, low gradient severe aortic stenosis. Repeat cardiac catheterization showed improvement in non-obstructive CAD (30% dLAD stenosis) and moderate-severe low gradient AS with some improvement in EF to 35-45%. She underwent TAVR 01/92 which was complicated by intraprocedureal CHB managed with CRT-P placed by Dr. Lovena Le. Subsequent device monitoring showed paroxysmal atrial fibrillation with burden <1% and all episodes <20 minutes. Her last echocardiogram 11/2019 showed EF 45-50%, RWMA in the basal region, G2DD, sp TAVR with mild per-valvular regurgitation, mild-moderate TR, mild RAE, and moderate LAE.   Pacer check 08/28/21 showed normal function. No Afib. Repeat Echo September 2022 showed no change. EF 45-50%. Mild perivalvular TAVR leak. No AS. Mild pulmonary HTN.   She notes that in December she had a bad cough that lasted 4 weeks. No fever. Finally resolved. Now back walking and playing tennis. She is getting injections in knee for pain. Tries and limits NSAIDs with some renal insufficiency.   Past Medical History:  Diagnosis Date   Abnormality of gait 10/20/2014   Arthritis     "was in my hips; still in my knees" (01/13/2018)   Bicuspid aortic valve    Chronic combined systolic and diastolic heart failure (HCC)    DJD (degenerative joint disease), ankle and foot 10/20/2014   HAMMER TOE, ACQUIRED 07/28/2007   Qualifier: Diagnosis of  By: Copland MD, Spencer     HLD (hyperlipidemia) 01/14/2018   Metatarsalgia of left foot 10/20/2014   Mild coronary artery disease 01/14/2018   LAD with 50% stenosis on [facility] 12/2017   Mitral regurgitation    OA (osteoarthritis) of hip 03/24/2013   PLANTAR FASCIITIS 07/28/2007   Qualifier: Diagnosis of  By: Lorelei Pont MD, Spencer     RBBB    S/P TAVR (transcatheter aortic valve replacement) 11/10/2018   26 mm Edwards Sapien 3 transcatheter heart valve placed via percutaneous right transfemoral approach    Severe aortic stenosis    Varicose veins of bilateral lower extremities with other complications 04/02/8298    Past Surgical History:  Procedure Laterality Date   ABDOMINAL HYSTERECTOMY  03/03/2012   Procedure: HYSTERECTOMY ABDOMINAL;  Surgeon: Alvino Chapel, MD;  [facility];  Service: Gynecology;  Laterality: N/A;   BIV PACEMAKER INSERTION CRT-P N/A 11/11/2018   Procedure: BIV PACEMAKER INSERTION CRT-P;  Surgeon: Evans Lance, MD;  Location: [place] [facility];  Service: Cardiovascular;  Laterality: N/A;   BLEPHAROPLASTY Bilateral    JOINT REPLACEMENT     LAPAROTOMY  03/03/2012   Procedure: EXPLORATORY LAPAROTOMY;  Surgeon: Alvino Chapel, MD;  [facility];  Service: Gynecology;  Laterality: N/A; fibrothecoma  on final pathology   RIGHT/LEFT HEART CATH AND CORONARY ANGIOGRAPHY N/A 01/14/2018   Procedure: RIGHT/LEFT HEART CATH AND CORONARY ANGIOGRAPHY;  Surgeon: Wellington Hampshire, MD;  Location: [place] [facility];  Service: Cardiovascular;  Laterality: N/A;   RIGHT/LEFT HEART CATH AND CORONARY ANGIOGRAPHY N/A 10/23/2018   Procedure: RIGHT/LEFT HEART CATH AND CORONARY ANGIOGRAPHY;  Surgeon: [name], Phuong Moffatt M, MD;   Location: [place] [facility];  Service: Cardiovascular;  Laterality: N/A;   SALPINGOOPHORECTOMY  03/03/2012   Procedure: SALPINGO OOPHERECTOMY;  Surgeon: Alvino Chapel, MD;  [facility];  Service: Gynecology;  Laterality: Bilateral;   TEE WITHOUT CARDIOVERSION N/A 11/10/2018   Procedure: TRANSESOPHAGEAL ECHOCARDIOGRAM (TEE);  Surgeon: Sherren Mocha, MD;  Location: [place] [facility];  Service: Open Heart Surgery;  Laterality: N/A;   TONSILLECTOMY     TOTAL HIP ARTHROPLASTY Left 03/24/2013   Procedure: LEFT TOTAL HIP ARTHROPLASTY ANTERIOR APPROACH;  Surgeon: [name], MD;  [facility];  Service: Orthopedics;  Laterality: Left;   TOTAL HIP ARTHROPLASTY Right 12/18/2016   Procedure: RIGHT TOTAL HIP ARTHROPLASTY ANTERIOR APPROACH;  Surgeon: Gaynelle Arabian, MD;  [facility];  Service: Orthopedics;  Laterality: Right;   TRANSCATHETER AORTIC VALVE REPLACEMENT, TRANSFEMORAL N/A 11/10/2018   Procedure: TRANSCATHETER AORTIC VALVE REPLACEMENT, TRANSFEMORAL;  Surgeon: Sherren Mocha, MD;  Location: [place] [facility];  Service: Open Heart Surgery;  Laterality: N/A;   TUBAL LIGATION       Current Outpatient Medications  Medication Sig Dispense Refill   acetaminophen (TYLENOL) 500 MG tablet Take 500-1,000 mg by mouth every 6 (six) hours as needed for moderate pain.     amoxicillin (AMOXIL) 500 MG tablet Take 4 tablets (2,000 mg total) by mouth as directed. 1 hour prior to dental work including cleanings 12 tablet 12   ASPIRIN LOW DOSE 81 MG EC tablet TAKE 1 TABLET BY MOUTH EVERY DAY 90 tablet 3   diclofenac sodium (VOLTAREN) 1 % GEL Apply 1 application topically 4 (four) times daily as needed (pain).     estradiol (CLIMARA - DOSED IN MG/24 HR) 0.025 mg/24hr patch PLACE 1 PATCH (0.025 MG TOTAL) ONTO THE SKIN ONCE A WEEK. (Patient not taking: Reported on 05/08/2021) 12 patch 4   ibuprofen (ADVIL) 200 MG tablet Take 200 mg by mouth every 6 (six) hours as needed.     Menthol-Methyl  Salicylate (MUSCLE RUB EX) Apply 1 application topically daily as needed (muscle pain).     UNABLE TO FIND Hyland's Leg Cramps tablets: Take 1 tablet by mouth during the night as needed for leg cramps     zolpidem (AMBIEN) 10 MG tablet Take 1 tablet (10 mg total) by mouth at bedtime as needed. For sleep 30 tablet 1   No current facility-administered medications for this visit.    Allergies:   Codeine and Plavix [clopidogrel bisulfate]    Social History:  The patient  reports that she has never smoked. She has never used smokeless tobacco. She reports current alcohol use of about 7.0 standard drinks per week. She reports that she does not use drugs.   Family History:  The patient's family history includes Bone cancer in her father; Breast cancer in her maternal aunt; Diabetes in her maternal aunt; Hypertension in her mother; Lung cancer in her father; Ovarian cancer in her sister.    ROS:  Please see the history of present illness.   Otherwise, review of systems are positive for none.   All other systems are reviewed and  negative.    PHYSICAL EXAM: VS:  BP (!) 142/88    Pulse 69    Ht 5\' 6"  (1.676 m)    Wt 154 lb 6.4 oz (70 kg)    SpO2 96%    BMI 24.92 kg/m  , BMI Body mass index is 24.92 kg/m. GEN: Well nourished, well developed, in no acute distress HEENT: normal Neck: no JVD, carotid bruits, or masses Cardiac: RRR; no murmurs, rubs, or gallops,no edema  Respiratory:  clear to auscultation bilaterally, normal work of breathing GI: soft, nontender, nondistended, + BS MS: no deformity or atrophy Skin: warm and dry, no rash Neuro:  Strength and sensation are intact Psych: euthymic mood, full affect   EKG:  EKG ordered today.  NSR rate 69. RAD. Nonspecific IVCD. I have personally reviewed and interpreted this study.    Recent Labs: No results found for requested labs within last 8760 hours.    Lipid Panel    Component Value Date/Time   CHOL 193 01/14/2018 0212   TRIG 55  01/14/2018 0212   HDL 85 01/14/2018 0212   CHOLHDL 2.3 01/14/2018 0212   VLDL 11 01/14/2018 0212   LDLCALC 97 01/14/2018 0212   Dated 11/06/20: cholesterol 244, triglycerides 57, HDL 103, LDL 132. CMET normal  Wt Readings from Last 3 Encounters:  10/08/21 154 lb 6.4 oz (70 kg)  05/08/21 159 lb 3.2 oz (72.2 kg)  04/30/21 157 lb 6.4 oz (71.4 kg)      Other studies Reviewed: Additional studies/ records that were reviewed today include:   Echocardiogram 11/2019: 1. Left ventricular ejection fraction, by estimation, is 45 to 50%. The  left ventricle has mildly decreased function. The left ventricle  demonstrates regional wall motion abnormalities. Basal anterolateral,  basal inferolateral, and basal inferior severe   hypokinesis. Left ventricular diastolic parameters are consistent with  Grade II diastolic dysfunction (pseudonormalization).   2. Right ventricular systolic function is normal. The right ventricular  size is mildly enlarged. There is normal pulmonary artery systolic  pressure. The estimated right ventricular systolic pressure is 27.0 mmHg.   3. 26 mm Edwards Sapien bioprosthetic aortic valve s/p TAVR. Mild,  eccentric peri-valvular regurgitation. Mean gradient 15 mmHg with  calculated AVA 1.04 cm^2.   4. The mitral valve is normal in structure. Trivial mitral valve  regurgitation. No evidence of mitral stenosis.   5. Tricuspid valve regurgitation is mild to moderate.   6. Right atrial size was mildly dilated.   7. Left atrial size was moderately dilated.   8. The inferior vena cava is normal in size with greater than 50%  respiratory variability, suggesting right atrial pressure of 3 mmHg.   R/LHC 10/2018: Dist LAD lesion is 30% stenosed. There is moderate left ventricular systolic dysfunction. The left ventricular ejection fraction is 35-45% by visual estimate. LV end diastolic pressure is normal. There is moderate aortic valve stenosis.   1. Minimal nonobstructive  CAD 2. Moderate LV dysfunction. EF 35-40%. 3. At least moderate AS. Mean gradient 27 mm Hg. AVA may be underestimated due to decreased LV function 4. Normal LV filling pressures. 5. Normal right heart pressures.   Plan: will refer to Valvular heart team for consideration of TAVR.   Echo 05/31/21: IMPRESSIONS     1. Left ventricular ejection fraction, by estimation, is 45 to 50%. The  left ventricle has mildly decreased function. The left ventricle  demonstrates regional wall motion abnormalities (see scoring  diagram/findings for description). Left ventricular  diastolic parameters are  consistent with Grade I diastolic dysfunction  (impaired relaxation).   2. Right ventricular systolic function is normal. The right ventricular  size is mildly enlarged. There is mildly elevated pulmonary artery  systolic pressure. The estimated right ventricular systolic pressure is  89.2 mmHg.   3. The mitral valve is normal in structure. No evidence of mitral valve  regurgitation. No evidence of mitral stenosis.   4. Tricuspid valve regurgitation is moderate.   5. Mild perivavlular leak (ventricular septal side). The aortic valve has  been repaired/replaced. Aortic valve regurgitation is not visualized. No  aortic stenosis is present. There is a 26 mm Sapien prosthetic (TAVR)  valve present in the aortic  position. Procedure Date: March 2020. Echo findings are consistent with  perivalvular leak of the aortic prosthesis. Aortic valve mean gradient  measures 10.8 mmHg. Aortic valve Vmax measures 2.19 m/s.   6. The inferior vena cava is dilated in size with >50% respiratory  variability, suggesting right atrial pressure of 8 mmHg.   Comparison(s): No significant change from prior study. Prior images  reviewed side by side.   ASSESSMENT AND PLAN:  1. Chronic combined CHF/NICM:   - EF mildly reduced 45-50%.  - no volume overload complaints and she appears euvolemic on exam. Ultimately unable to  tolerate GDMT due to hypotension - Continue to encourage monitoring daily weights and limiting salt intake  2. Non-obstructive CAD: mild distal LAD disease noted on [facility] 10/2018. No recent anginal complaints. She continues to be active  3. Severe aortic stenosis s/p TAVR 11/2018: stable on last echo Sept 2022 with mild perivalvular leak noted.  - Continue aspirin - Continue SBE ppx with amoxicillin  4. CHB s/p PPM: normal device function without abnormalities noted on last device interrogation 08/2021 - Continue routine device monitoring per Dr. Lovena Le   Current medicines are reviewed at length with the patient today.  The patient does not have concerns regarding medicines.  The following changes have been made:  As above  Labs/ tests ordered today include:   No orders of the defined types were placed in this encounter.     Disposition:   FU with Dr. [name] in 6 months  Signed, Dunbar Buras [name], MD  10/08/2021 2:37 PM

## 2021-10-08 ENCOUNTER — Other Ambulatory Visit: Payer: Self-pay

## 2021-10-08 ENCOUNTER — Ambulatory Visit: Payer: PPO | Admitting: Cardiology

## 2021-10-08 ENCOUNTER — Encounter: Payer: Self-pay | Admitting: Cardiology

## 2021-10-08 VITALS — BP 142/88 | HR 69 | Ht 66.0 in | Wt 154.4 lb

## 2021-10-08 DIAGNOSIS — I350 Nonrheumatic aortic (valve) stenosis: Secondary | ICD-10-CM

## 2021-10-08 DIAGNOSIS — I442 Atrioventricular block, complete: Secondary | ICD-10-CM | POA: Diagnosis not present

## 2021-10-08 DIAGNOSIS — I2510 Atherosclerotic heart disease of native coronary artery without angina pectoris: Secondary | ICD-10-CM | POA: Diagnosis not present

## 2021-10-08 DIAGNOSIS — I5042 Chronic combined systolic (congestive) and diastolic (congestive) heart failure: Secondary | ICD-10-CM | POA: Diagnosis not present

## 2021-10-08 DIAGNOSIS — Z952 Presence of prosthetic heart valve: Secondary | ICD-10-CM

## 2021-10-11 DIAGNOSIS — M170 Bilateral primary osteoarthritis of knee: Secondary | ICD-10-CM | POA: Diagnosis not present

## 2021-10-18 DIAGNOSIS — M170 Bilateral primary osteoarthritis of knee: Secondary | ICD-10-CM | POA: Diagnosis not present

## 2021-11-08 DIAGNOSIS — E7800 Pure hypercholesterolemia, unspecified: Secondary | ICD-10-CM | POA: Diagnosis not present

## 2021-11-08 DIAGNOSIS — Z79899 Other long term (current) drug therapy: Secondary | ICD-10-CM | POA: Diagnosis not present

## 2021-11-09 DIAGNOSIS — Z79899 Other long term (current) drug therapy: Secondary | ICD-10-CM | POA: Diagnosis not present

## 2021-11-09 DIAGNOSIS — Z23 Encounter for immunization: Secondary | ICD-10-CM | POA: Diagnosis not present

## 2021-11-09 DIAGNOSIS — R7301 Impaired fasting glucose: Secondary | ICD-10-CM | POA: Diagnosis not present

## 2021-11-09 DIAGNOSIS — G479 Sleep disorder, unspecified: Secondary | ICD-10-CM | POA: Diagnosis not present

## 2021-11-09 DIAGNOSIS — R5383 Other fatigue: Secondary | ICD-10-CM | POA: Diagnosis not present

## 2021-11-09 DIAGNOSIS — E7800 Pure hypercholesterolemia, unspecified: Secondary | ICD-10-CM | POA: Diagnosis not present

## 2021-11-09 DIAGNOSIS — Z87448 Personal history of other diseases of urinary system: Secondary | ICD-10-CM | POA: Diagnosis not present

## 2021-11-09 DIAGNOSIS — R252 Cramp and spasm: Secondary | ICD-10-CM | POA: Diagnosis not present

## 2021-11-09 DIAGNOSIS — Z0000 Encounter for general adult medical examination without abnormal findings: Secondary | ICD-10-CM | POA: Diagnosis not present

## 2021-11-09 DIAGNOSIS — M179 Osteoarthritis of knee, unspecified: Secondary | ICD-10-CM | POA: Diagnosis not present

## 2021-11-27 ENCOUNTER — Ambulatory Visit (INDEPENDENT_AMBULATORY_CARE_PROVIDER_SITE_OTHER): Payer: PPO

## 2021-11-27 DIAGNOSIS — I442 Atrioventricular block, complete: Secondary | ICD-10-CM

## 2021-11-27 LAB — CUP PACEART REMOTE DEVICE CHECK
Battery Remaining Longevity: 126 mo
Battery Remaining Percentage: 100 %
Brady Statistic RA Percent Paced: 0 %
Brady Statistic RV Percent Paced: 34 %
Date Time Interrogation Session: 20230328011100
Implantable Lead Implant Date: 20200311
Implantable Lead Implant Date: 20200311
Implantable Lead Implant Date: 20200311
Implantable Lead Location: 753858
Implantable Lead Location: 753859
Implantable Lead Location: 753860
Implantable Lead Model: 4674
Implantable Lead Model: 7741
Implantable Lead Model: 7742
Implantable Lead Serial Number: 1022279
Implantable Lead Serial Number: 1098426
Implantable Lead Serial Number: 830411
Implantable Pulse Generator Implant Date: 20200311
Lead Channel Impedance Value: 656 Ohm
Lead Channel Impedance Value: 744 Ohm
Lead Channel Impedance Value: 920 Ohm
Lead Channel Pacing Threshold Amplitude: 0.5 V
Lead Channel Pacing Threshold Amplitude: 0.9 V
Lead Channel Pacing Threshold Amplitude: 1.6 V
Lead Channel Pacing Threshold Pulse Width: 0.4 ms
Lead Channel Pacing Threshold Pulse Width: 0.4 ms
Lead Channel Pacing Threshold Pulse Width: 0.4 ms
Lead Channel Setting Pacing Amplitude: 2 V
Lead Channel Setting Pacing Amplitude: 2.5 V
Lead Channel Setting Pacing Amplitude: 2.5 V
Lead Channel Setting Pacing Pulse Width: 0.4 ms
Lead Channel Setting Pacing Pulse Width: 0.4 ms
Lead Channel Setting Sensing Sensitivity: 2.5 mV
Lead Channel Setting Sensing Sensitivity: 2.5 mV
Pulse Gen Serial Number: 747113

## 2021-12-07 NOTE — Progress Notes (Signed)
Remote pacemaker transmission.   

## 2021-12-12 ENCOUNTER — Other Ambulatory Visit: Payer: Self-pay | Admitting: Cardiology

## 2022-02-06 DIAGNOSIS — M1711 Unilateral primary osteoarthritis, right knee: Secondary | ICD-10-CM | POA: Diagnosis not present

## 2022-02-15 DIAGNOSIS — M25561 Pain in right knee: Secondary | ICD-10-CM | POA: Diagnosis not present

## 2022-02-18 ENCOUNTER — Telehealth: Payer: Self-pay

## 2022-02-18 ENCOUNTER — Other Ambulatory Visit (HOSPITAL_COMMUNITY): Payer: Self-pay | Admitting: Orthopaedic Surgery

## 2022-02-18 ENCOUNTER — Other Ambulatory Visit: Payer: Self-pay | Admitting: Orthopaedic Surgery

## 2022-02-18 DIAGNOSIS — M25561 Pain in right knee: Secondary | ICD-10-CM

## 2022-02-18 NOTE — Telephone Encounter (Signed)
The patient asked if there is any way we can speed up her MRI? I let the patient know that we do not schedule MRI's. All we do is fill out the clearance form.  She also states her left arm hurts when she move her arm. Her pacemaker site is bruised. I let her speak with Leigh, rn.

## 2022-02-18 NOTE — Telephone Encounter (Signed)
Spoke to patient reports of intermittent sharp pain in left arm when she moves arm over her device. Aslo reports of bruising to site.  Device Clinic apt made 02/21/22 @ 4:00 pm.

## 2022-02-19 ENCOUNTER — Telehealth: Payer: Self-pay | Admitting: Internal Medicine

## 2022-02-19 NOTE — Telephone Encounter (Signed)
Spoke with MRI at [facility] she stated that order was just put in today  and would be faxing clearance for patients device over when the MRI team reviews patients information.

## 2022-02-19 NOTE — Telephone Encounter (Signed)
Patient received call from [facility].     Patient needs clearance paperwork filled out so she can have an MRI. Form can be faxed to [phone] or call (phone# [phone]) for further information.

## 2022-02-20 NOTE — Telephone Encounter (Signed)
Pt is calling to get update in regards to this paperwork and scheduling. Requesting call back.

## 2022-02-21 ENCOUNTER — Ambulatory Visit: Payer: PPO

## 2022-02-26 ENCOUNTER — Ambulatory Visit (INDEPENDENT_AMBULATORY_CARE_PROVIDER_SITE_OTHER): Payer: PPO

## 2022-02-26 DIAGNOSIS — I442 Atrioventricular block, complete: Secondary | ICD-10-CM

## 2022-02-26 LAB — CUP PACEART REMOTE DEVICE CHECK
Battery Remaining Longevity: 126 mo
Battery Remaining Percentage: 100 %
Brady Statistic RA Percent Paced: 0 %
Brady Statistic RV Percent Paced: 38 %
Date Time Interrogation Session: 20230627011100
Implantable Lead Implant Date: 20200311
Implantable Lead Implant Date: 20200311
Implantable Lead Implant Date: 20200311
Implantable Lead Location: 753858
Implantable Lead Location: 753859
Implantable Lead Location: 753860
Implantable Lead Model: 4674
Implantable Lead Model: 7741
Implantable Lead Model: 7742
Implantable Lead Serial Number: 1022279
Implantable Lead Serial Number: 1098426
Implantable Lead Serial Number: 830411
Implantable Pulse Generator Implant Date: 20200311
Lead Channel Impedance Value: 670 Ohm
Lead Channel Impedance Value: 723 Ohm
Lead Channel Impedance Value: 879 Ohm
Lead Channel Pacing Threshold Amplitude: 0.5 V
Lead Channel Pacing Threshold Amplitude: 0.9 V
Lead Channel Pacing Threshold Amplitude: 1.6 V
Lead Channel Pacing Threshold Pulse Width: 0.4 ms
Lead Channel Pacing Threshold Pulse Width: 0.4 ms
Lead Channel Pacing Threshold Pulse Width: 0.4 ms
Lead Channel Setting Pacing Amplitude: 2 V
Lead Channel Setting Pacing Amplitude: 2.5 V
Lead Channel Setting Pacing Amplitude: 2.5 V
Lead Channel Setting Pacing Pulse Width: 0.4 ms
Lead Channel Setting Pacing Pulse Width: 0.4 ms
Lead Channel Setting Sensing Sensitivity: 2.5 mV
Lead Channel Setting Sensing Sensitivity: 2.5 mV
Pulse Gen Serial Number: 747113

## 2022-03-14 DIAGNOSIS — M1711 Unilateral primary osteoarthritis, right knee: Secondary | ICD-10-CM | POA: Diagnosis not present

## 2022-03-14 DIAGNOSIS — S72431A Displaced fracture of medial condyle of right femur, initial encounter for closed fracture: Secondary | ICD-10-CM | POA: Diagnosis not present

## 2022-03-14 DIAGNOSIS — M25561 Pain in right knee: Secondary | ICD-10-CM | POA: Diagnosis not present

## 2022-03-14 DIAGNOSIS — S83281A Other tear of lateral meniscus, current injury, right knee, initial encounter: Secondary | ICD-10-CM | POA: Diagnosis not present

## 2022-03-19 NOTE — Progress Notes (Signed)
Remote pacemaker transmission.   

## 2022-03-22 DIAGNOSIS — M1711 Unilateral primary osteoarthritis, right knee: Secondary | ICD-10-CM | POA: Diagnosis not present

## 2022-03-22 DIAGNOSIS — M25561 Pain in right knee: Secondary | ICD-10-CM | POA: Diagnosis not present

## 2022-04-02 DIAGNOSIS — L570 Actinic keratosis: Secondary | ICD-10-CM | POA: Diagnosis not present

## 2022-04-02 DIAGNOSIS — D485 Neoplasm of uncertain behavior of skin: Secondary | ICD-10-CM | POA: Diagnosis not present

## 2022-04-02 DIAGNOSIS — L821 Other seborrheic keratosis: Secondary | ICD-10-CM | POA: Diagnosis not present

## 2022-04-02 DIAGNOSIS — L820 Inflamed seborrheic keratosis: Secondary | ICD-10-CM | POA: Diagnosis not present

## 2022-04-02 DIAGNOSIS — D1801 Hemangioma of skin and subcutaneous tissue: Secondary | ICD-10-CM | POA: Diagnosis not present

## 2022-04-12 ENCOUNTER — Other Ambulatory Visit: Payer: Self-pay | Admitting: *Deleted

## 2022-04-12 NOTE — Patient Outreach (Signed)
  Care Coordination   04/12/2022 Name: Consuello Lassalle MRN: [id number] DOB: 1946-03-05   Care Coordination Outreach Attempts:  An unsuccessful telephone outreach was attempted today to offer the patient information about available care coordination services as a benefit of their health plan.   Follow Up Plan:  Additional outreach attempts will be made to offer the patient care coordination information and services.   Encounter Outcome:  No Answer  Care Coordination Interventions Activated:  Yes   Care Coordination Interventions:  Yes, provided    [place] [facility] [phone]

## 2022-04-16 ENCOUNTER — Encounter (HOSPITAL_COMMUNITY): Payer: Self-pay

## 2022-04-16 ENCOUNTER — Ambulatory Visit (HOSPITAL_COMMUNITY): Payer: PPO

## 2022-04-22 DIAGNOSIS — M170 Bilateral primary osteoarthritis of knee: Secondary | ICD-10-CM | POA: Diagnosis not present

## 2022-04-22 DIAGNOSIS — M25561 Pain in right knee: Secondary | ICD-10-CM | POA: Diagnosis not present

## 2022-05-28 ENCOUNTER — Ambulatory Visit (INDEPENDENT_AMBULATORY_CARE_PROVIDER_SITE_OTHER): Payer: PPO

## 2022-05-28 DIAGNOSIS — Z1231 Encounter for screening mammogram for malignant neoplasm of breast: Secondary | ICD-10-CM | POA: Diagnosis not present

## 2022-05-28 DIAGNOSIS — I442 Atrioventricular block, complete: Secondary | ICD-10-CM | POA: Diagnosis not present

## 2022-05-29 LAB — CUP PACEART REMOTE DEVICE CHECK
Battery Remaining Longevity: 126 mo
Battery Remaining Percentage: 100 %
Brady Statistic RA Percent Paced: 0 %
Brady Statistic RV Percent Paced: 37 %
Date Time Interrogation Session: 20230926011100
Implantable Lead Implant Date: 20200311
Implantable Lead Implant Date: 20200311
Implantable Lead Implant Date: 20200311
Implantable Lead Location: 753858
Implantable Lead Location: 753859
Implantable Lead Location: 753860
Implantable Lead Model: 4674
Implantable Lead Model: 7741
Implantable Lead Model: 7742
Implantable Lead Serial Number: 1022279
Implantable Lead Serial Number: 1098426
Implantable Lead Serial Number: 830411
Implantable Pulse Generator Implant Date: 20200311
Lead Channel Impedance Value: 711 Ohm
Lead Channel Impedance Value: 779 Ohm
Lead Channel Impedance Value: 864 Ohm
Lead Channel Pacing Threshold Amplitude: 0.5 V
Lead Channel Pacing Threshold Amplitude: 0.8 V
Lead Channel Pacing Threshold Amplitude: 1.6 V
Lead Channel Pacing Threshold Pulse Width: 0.4 ms
Lead Channel Pacing Threshold Pulse Width: 0.4 ms
Lead Channel Pacing Threshold Pulse Width: 0.4 ms
Lead Channel Setting Pacing Amplitude: 2 V
Lead Channel Setting Pacing Amplitude: 2.5 V
Lead Channel Setting Pacing Amplitude: 2.5 V
Lead Channel Setting Pacing Pulse Width: 0.4 ms
Lead Channel Setting Pacing Pulse Width: 0.4 ms
Lead Channel Setting Sensing Sensitivity: 2.5 mV
Lead Channel Setting Sensing Sensitivity: 2.5 mV
Pulse Gen Serial Number: 747113

## 2022-06-06 NOTE — Progress Notes (Signed)
Remote pacemaker transmission.   

## 2022-06-20 ENCOUNTER — Telehealth: Payer: Self-pay | Admitting: Cardiology

## 2022-06-20 NOTE — Telephone Encounter (Signed)
Patient reports that on 10/17, she was playing tennis and became tachycardic with SOB, back ache, and weak legs. This was between 9 AM and 10:30 AM She has a pacer. Yesterday, she played golf without symptoms. She gets tired when walking uphill. Also, she stated for the past 6 months, she has had weakness in her left hand, and when she reaches with her left arm, she feels nerve pain. Dr. [name] advised. Recommended for device team to look at 06/18/22 between 9-10:30 AM. Patient may need to see APP. Patient advised. She did a Banker while on the phone. Informed her that Dr. [name] will be advised of device team's information.

## 2022-06-20 NOTE — Telephone Encounter (Signed)
Patient is returning RN's call. She is requesting if the call goes straight to VM when calling back attempt the call again so it will go through.

## 2022-06-20 NOTE — Telephone Encounter (Signed)
Patient c/o Palpitations:  High priority if patient c/o lightheadedness, shortness of breath, or chest pain  How long have you had palpitations/irregular HR/ Afib? Are you having the symptoms now? Had episode Tuesday, not having symptoms now   Are you currently experiencing lightheadedness, SOB or CP? No   Do you have a history of afib (atrial fibrillation) or irregular heart rhythm? No   Have you checked your BP or HR? (document readings if available): No   Are you experiencing any other symptoms? Had back pain and legs felt weak   Patient was playing tennis Tuesday 06/18/22 and had an episode of her HR pounding extremely fast. She reports she had symptoms of back pain and her legs feeling weak causing her to stop playing. She states this has not happened before and has not occurred since, but she has also not performed any more activities to elevate her HR. She is unsure how she should proceed with this. Please advise.

## 2022-06-20 NOTE — Telephone Encounter (Signed)
Remote transmission received and reviewed. Normal device function. No episodes found on interrogation.

## 2022-06-20 NOTE — Telephone Encounter (Signed)
LMTCB

## 2022-06-20 NOTE — Telephone Encounter (Signed)
Informed patient of Dr. Doug Sou response to the download: "No significant arrhythmia to correlate with symptoms on 10/17." Patient asked if she can still be active. Recommended she be active, but go slow at first and stay hydrated. If she feels symptoms again, to stop activity and do another download. She stated she takes ASA '81mg'$  daily and will take 2 ibuprofen tablets before tennis due to arthritis. Education given on dosing and signs of bleeding. Patient verbalized understanding.

## 2022-06-24 ENCOUNTER — Telehealth: Payer: Self-pay | Admitting: Cardiology

## 2022-06-24 NOTE — Telephone Encounter (Signed)
   Pt is calling back, she said she experiencing her symptoms again and would like to speak with RN Judson Roch

## 2022-06-24 NOTE — Telephone Encounter (Signed)
Patient stated that when she played tennis over the weekend or walks uphill, she gets "very winded" and has to stop the activity to catch her breath. Recommended that she not do strenuous activity until seen in the office. Appointment made with E, Monge for 10/26. Please refer to 10/19 phone note as well. She took entresto in the past, but didn't like how it made her feel.

## 2022-06-26 NOTE — Telephone Encounter (Signed)
Patient stated she drank coffee this morning with collagen added to it and her heart began to pound harder. She looked up collagen and found that it could cause afib. She has taken collagen for her joints for 3 months. She wanted to know if she should keep her appointment tomorrow with E. Monge. Recommended that she keep her appointment for a ib evaluation. Patient education on checking with pharmacist before ordering supplements online or OTC. She verbalized understanding and stated she will keep appointment.

## 2022-06-26 NOTE — Telephone Encounter (Signed)
Patient is following up, requesting to speak with Judson Roch, RN specifically. She mentions she may want to cancel 10/26 follow-up with Diona Browner, but she would like to speak with RN prior to cancelling. She states she is no longer having issues and she would like to discuss further with RN.

## 2022-06-27 ENCOUNTER — Ambulatory Visit: Payer: PPO | Attending: Nurse Practitioner | Admitting: Nurse Practitioner

## 2022-06-27 ENCOUNTER — Other Ambulatory Visit: Payer: Self-pay

## 2022-06-27 ENCOUNTER — Encounter: Payer: Self-pay | Admitting: Nurse Practitioner

## 2022-06-27 VITALS — BP 172/92 | HR 62 | Ht 66.0 in | Wt 156.4 lb

## 2022-06-27 DIAGNOSIS — I350 Nonrheumatic aortic (valve) stenosis: Secondary | ICD-10-CM

## 2022-06-27 DIAGNOSIS — I5042 Chronic combined systolic (congestive) and diastolic (congestive) heart failure: Secondary | ICD-10-CM | POA: Diagnosis not present

## 2022-06-27 DIAGNOSIS — I442 Atrioventricular block, complete: Secondary | ICD-10-CM

## 2022-06-27 DIAGNOSIS — E785 Hyperlipidemia, unspecified: Secondary | ICD-10-CM

## 2022-06-27 DIAGNOSIS — I428 Other cardiomyopathies: Secondary | ICD-10-CM | POA: Diagnosis not present

## 2022-06-27 DIAGNOSIS — Z952 Presence of prosthetic heart valve: Secondary | ICD-10-CM | POA: Diagnosis not present

## 2022-06-27 DIAGNOSIS — Z950 Presence of cardiac pacemaker: Secondary | ICD-10-CM

## 2022-06-27 DIAGNOSIS — I2510 Atherosclerotic heart disease of native coronary artery without angina pectoris: Secondary | ICD-10-CM

## 2022-06-27 DIAGNOSIS — R0609 Other forms of dyspnea: Secondary | ICD-10-CM

## 2022-06-27 DIAGNOSIS — R002 Palpitations: Secondary | ICD-10-CM

## 2022-06-27 MED ORDER — SACUBITRIL-VALSARTAN 24-26 MG PO TABS: 1.0000 | ORAL_TABLET | Freq: Two times a day (BID) | ORAL | 3 refills | Status: DC

## 2022-06-27 NOTE — Progress Notes (Signed)
Office Visit    Patient Name: Brittany Shaffer Date of Encounter: 06/27/2022  Primary Care Provider:  [name], MD Primary Cardiologist:  Peter [name], MD  Chief Complaint   76 year old female with a history of aortic stenosis s/p TAVR, CHB s/p PPM, chronic combined systolic and diastolic heart failure, ICM, nonobstructive CAD, and hyperlipidemia who presents for follow-up related to heart failure and aortic stenosis.  Past Medical History    Past Medical History:  Diagnosis Date   Abnormality of gait 10/20/2014   Arthritis    "was in my hips; still in my knees" (01/13/2018)   Bicuspid aortic valve    Chronic combined systolic and diastolic heart failure (HCC)    DJD (degenerative joint disease), ankle and foot 10/20/2014   HAMMER TOE, ACQUIRED 07/28/2007   Qualifier: Diagnosis of  By: Copland MD, Spencer     HLD (hyperlipidemia) 01/14/2018   Metatarsalgia of left foot 10/20/2014   Mild coronary artery disease 01/14/2018   LAD with 50% stenosis on [facility] 12/2017   Mitral regurgitation    OA (osteoarthritis) of hip 03/24/2013   PLANTAR FASCIITIS 07/28/2007   Qualifier: Diagnosis of  By: Lorelei Pont MD, Spencer     RBBB    S/P TAVR (transcatheter aortic valve replacement) 11/10/2018   26 mm Edwards Sapien 3 transcatheter heart valve placed via percutaneous right transfemoral approach    Severe aortic stenosis    Varicose veins of bilateral lower extremities with other complications 5/0/9326   Past Surgical History:  Procedure Laterality Date   ABDOMINAL HYSTERECTOMY  03/03/2012   Procedure: HYSTERECTOMY ABDOMINAL;  Surgeon: Alvino Chapel, MD;  Location: [facility];  Service: Gynecology;  Laterality: N/A;   BIV PACEMAKER INSERTION CRT-P N/A 11/11/2018   Procedure: BIV PACEMAKER INSERTION CRT-P;  Surgeon: Evans Lance, MD;  Location: [place] [facility];  Service: Cardiovascular;  Laterality: N/A;   BLEPHAROPLASTY Bilateral    JOINT REPLACEMENT     LAPAROTOMY  03/03/2012    Procedure: EXPLORATORY LAPAROTOMY;  Surgeon: Alvino Chapel, MD;  Location: [facility];  Service: Gynecology;  Laterality: N/A; 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 [facility];  Service: Cardiovascular;  Laterality: N/A;   RIGHT/LEFT HEART CATH AND CORONARY ANGIOGRAPHY N/A 10/23/2018   Procedure: RIGHT/LEFT HEART CATH AND CORONARY ANGIOGRAPHY;  Surgeon: [name], [name], MD;  Location: [place] [facility];  Service: Cardiovascular;  Laterality: N/A;   SALPINGOOPHORECTOMY  03/03/2012   Procedure: SALPINGO OOPHERECTOMY;  Surgeon: Alvino Chapel, MD;  Location: [facility];  Service: Gynecology;  Laterality: Bilateral;   TEE WITHOUT CARDIOVERSION N/A 11/10/2018   Procedure: TRANSESOPHAGEAL ECHOCARDIOGRAM (TEE);  Surgeon: Sherren Mocha, MD;  Location: [place] [facility];  Service: Open Heart Surgery;  Laterality: N/A;   TONSILLECTOMY     TOTAL HIP ARTHROPLASTY Left 03/24/2013   Procedure: LEFT TOTAL HIP ARTHROPLASTY ANTERIOR APPROACH;  Surgeon: [name], MD;  Location: [facility];  Service: Orthopedics;  Laterality: Left;   TOTAL HIP ARTHROPLASTY Right 12/18/2016   Procedure: RIGHT TOTAL HIP ARTHROPLASTY ANTERIOR APPROACH;  Surgeon: Gaynelle Arabian, MD;  Location: [facility];  Service: Orthopedics;  Laterality: Right;   TRANSCATHETER AORTIC VALVE REPLACEMENT, TRANSFEMORAL N/A 11/10/2018   Procedure: TRANSCATHETER AORTIC VALVE REPLACEMENT, TRANSFEMORAL;  Surgeon: Sherren Mocha, MD;  Location: [place] [facility];  Service: Open Heart Surgery;  Laterality: N/A;   TUBAL LIGATION      Allergies  Allergies  Allergen Reactions   Codeine Other (See Comments)    Hallucinations   Plavix [Clopidogrel Bisulfate] Rash    History of Present Illness   76 year old female with the above past medical history including aortic stenosis  s/p TAVR, CHB s/p PPM, chronic combined systolic and diastolic heart failure, NICM, nonobstructive CAD, and hyperlipidemia.  She was hospitalized in 2019 with chest pain and shortness of breath.  Echocardiogram at the time showed EF 50 to 55%, mild pulmonary hypertension, moderate aortic stenosis.  LHC during that admission showed near normal coronary arteries with only moderate apical LAD stenosis (50%), mild AS, moderate MR.  Repeat echocardiogram in 10/2018 showed EF decreased to 25 to 30%, restrictive filling parameters, severe pulmonary hypertension, low-flow, low gradient severe aortic stenosis.  Repeat cardiac catheterization in 10/2018 showed improvement in nonobstructive CAD (30% distal LAD stenosis), moderate to severe low gradient AS with some improvement in EF (35 to 45%).  She underwent TAVR in 05/4173 which was complicated by intraprocedural CHB s/p CRT-P placed by Dr. Lovena Le.  Subsequent device monitoring showed paroxysmal atrial fibrillation with burden less than 1%, all episodes less than 20 minutes. Most recent echocardiogram in September 2022 showed no change from prior.  EF 45 to 50%, mild paravalvular TAVR leak, no AS, mild pulmonary hypertension.  She was last seen in the office on 10/08/2021 and was stable from a cardiac standpoint.  She denied symptoms concerning for angina.  Escalation of GDMT was limited in the setting of hypotension.  She contacted our office on 06/20/2022 and reported palpitations while playing tennis 2 days prior. She noted associated back pain and weakness in her legs.  Remote pacer transmission showed normal device function, no evidence of arrhythmia.  She contacted our office again on 06/26/2022 and noted that she had palpitations once again after drinking her coffee with collagen added to it.  She presents today for follow-up. Since her last visit he has been stable from a cardiac standpoint.  She thinks that most of her symptoms began when she started taking a  collagen supplement.  She has stopped taking it as of today.  She denies any recurrent palpitations, chest pain, back pain, or dyspnea.  Her BP is significantly elevated in office today.  Other than her recent episodes of palpitations, back pain, and weakness, she denies any additional concerns today.  Home Medications    Current Outpatient Medications  Medication Sig Dispense Refill   acetaminophen (TYLENOL) 500 MG tablet Take 500-1,000 mg by mouth every 6 (six) hours as needed for moderate pain.     amoxicillin (AMOXIL) 500 MG tablet Take 4 tablets (2,000 mg total) by mouth as directed. 1 hour prior to dental work including cleanings 12 tablet 12   ASPIRIN LOW DOSE 81 MG EC tablet TAKE 1 TABLET BY MOUTH EVERY DAY 90 tablet 3   diclofenac sodium (VOLTAREN) 1 % GEL Apply 1 application topically 4 (four) times daily as needed (pain).     ibuprofen (ADVIL) 200 MG tablet Take 200 mg by mouth every 6 (six) hours as needed.     Menthol-Methyl Salicylate (MUSCLE RUB EX) Apply 1 application topically daily as needed (muscle pain).     sacubitril-valsartan (ENTRESTO) 24-26 MG Take 1 tablet by mouth 2 (two) times daily. 60 tablet 3   UNABLE TO FIND Hyland's Leg Cramps tablets: Take 1 tablet by mouth during the night as needed for leg cramps  zolpidem (AMBIEN) 10 MG tablet Take 1 tablet (10 mg total) by mouth at bedtime as needed. For sleep 30 tablet 1   No current facility-administered medications for this visit.     Review of Systems    She denies chest pain, pnd, orthopnea, n, v, dizziness, syncope, edema, weight gain, or early satiety. All other systems reviewed and are otherwise negative except as noted above.  Physical Exam    VS:  BP (!) 172/92   Pulse 62   Ht '5\' 6"'$  (1.676 m)   Wt 156 lb 6.4 oz (70.9 kg)   SpO2 96%   BMI 25.24 kg/m   GEN: Well nourished, well developed, in no acute distress. HEENT: normal. Neck: Supple, no JVD, carotid bruits, or masses. Cardiac: RRR, no murmurs,  rubs, or gallops. No clubbing, cyanosis, edema.  Radials/DP/PT 2+ and equal bilaterally.  Respiratory:  Respirations regular and unlabored, clear to auscultation bilaterally. GI: Soft, nontender, nondistended, BS + x 4. MS: no deformity or atrophy. Skin: warm and dry, no rash. Neuro:  Strength and sensation are intact. Psych: Normal affect.  Accessory Clinical Findings    ECG personally reviewed by me today -atrial sensed, V paced, 66 bpm- no acute changes.   Lab Results  Component Value Date   WBC 6.6 11/12/2018   HGB 10.3 (L) 11/12/2018   HCT 31.0 (L) 11/12/2018   MCV 99.0 11/12/2018   PLT 121 (L) 11/12/2018   Lab Results  Component Value Date   CREATININE 0.96 11/12/2018   BUN 15 11/12/2018   NA 138 11/12/2018   K 3.9 11/12/2018   CL 107 11/12/2018   CO2 26 11/12/2018   Lab Results  Component Value Date   ALT 15 11/09/2018   AST 24 11/09/2018   ALKPHOS 59 11/09/2018   BILITOT 0.7 11/09/2018   Lab Results  Component Value Date   CHOL 193 01/14/2018   HDL 85 01/14/2018   LDLCALC 97 01/14/2018   TRIG 55 01/14/2018   CHOLHDL 2.3 01/14/2018    Lab Results  Component Value Date   HGBA1C 5.9 (H) 11/09/2018    Assessment & Plan    1. Palpitations: She notes a recent increase in palpitations with physical exertion (while playing tennis), and after taking collagen supplementation.  She noted associated shortness of breath, back pain, and weakness in her legs.  Remote pacer transmission showed normal device function, no evidence of arrhythmia.  EKG today shows atrial sensed, V paced rhythm, 66 bpm, prolonged AV conduction.  She thinks that her palpitations may be the result of her collagen supplementation (apparently she researched online and saw that this could contribute to palpitations). She denies any chest pain, presyncope, syncope.  Discussed ED precautions.  She is going to stop taking collagen and continue to monitor symptoms.    2. Dyspnea on exertion: Occurred  in the setting of palpitations while playing tennis. She also noted some back pain, she denies chest pain.  States she played golf yesterday and tolerated this well.  Repeat echo as below, restart Entresto as below.  Continue to monitor.  3. Hypertension: BP is elevated. She was on Entresto previously and tolerated this well. Will restart Entresto 24-26 mg bid. BMET in 2 weeks. Goal BP 130/80.    4. Aortic stenosis: S/p TAVR in 11/2018. Most recent echo in September 2022 showed no change from prior.  EF 45 to 50%, mild paravalvular TAVR leak, no AS, mild pulmonary hypertension. Will repeat echo as above.   5.  CHB: S/p PPM. She notes positional L arm discomfort over the past 6 months. She thinks this may be related to her pacemanker. I advised her to discuss with her PCP as well as this sounds musculoskeletal.   6. Chronic combined systolic and diastolic heart failure/NICM: Most recent echo in September 2022 showed no change from prior. EF 45 to 50%, mild paravalvular TAVR leak, no AS, mild pulmonary hypertension.  Euvolemic and well compensated on exam.  Given elevated BP, recent dyspnea, will restart Entresto as above.   7. Nonobstructive CAD: Cath in showed improvement in nonobstructive CAD (30% distal LAD stenosis), moderate to severe low gradient AS with some improvement in EF (35 to 45%).She did have recent exertional dyspnea, palpitations, back pain, and weakness in her legs while playing tennis.  She denies any further symptoms concerning for angina.  She does note some intermittent left arm pain that occurs with certain positions, this is not exertional and sound musculoskeletal in nature.  However if symptoms persist, consider ischemic evaluation.  Continue aspirin.    8. Hyperlipidemia: LDL was 113 in 10/2021.  Monitored and managed per PCP. Declines statin.   9. Disposition: Follow-up in 1 month.   HYPERTENSION CONTROL Vitals:   06/27/22 0843 06/27/22 0933  BP: (!) 190/100 (!) 172/92     The patient's blood pressure is elevated above target today.  In order to address the patient's elevated BP: A new medication was prescribed today.; Blood pressure will be monitored at home to determine if medication changes need to be made.; Follow up with general cardiology has been recommended.     Lenna Sciara, NP 06/27/2022, 12:39 PM

## 2022-06-27 NOTE — Patient Instructions (Addendum)
Medication Instructions:  Entresto 24-26 mg take one tablet twice daily  *If you need a refill on your cardiac medications before your next appointment, please call your pharmacy*   Lab Work: Your physician recommends that you return for lab work in 2 weeks BMET  If you have labs (blood work) drawn today and your tests are completely normal, you will receive your results only by: [place] (if you have MyChart) OR A paper copy in the mail If you have any lab test that is abnormal or we need to change your treatment, we will call you to review the results.   Testing/Procedures: Your physician has requested that you have an echocardiogram. Echocardiography is a painless test that uses sound waves to create images of your heart. It provides your doctor with information about the size and shape of your heart and how well your heart's chambers and valves are working. This procedure takes approximately one hour. There are no restrictions for this procedure. Please do NOT wear cologne, perfume, aftershave, or lotions (deodorant is allowed). Please arrive 15 minutes prior to your appointment time.    Follow-Up: At [facility], you and your health needs are our priority.  As part of our continuing mission to provide you with exceptional heart care, we have created designated Provider Care Teams.  These Care Teams include your primary Cardiologist (physician) and Advanced Practice Providers (APPs -  Physician Assistants and Nurse Practitioners) who all work together to provide you with the care you need, when you need it.  We recommend signing up for the patient portal called "MyChart".  Sign up information is provided on this After Visit Summary.  MyChart is used to connect with patients for Virtual Visits (Telemedicine).  Patients are able to view lab/test results, encounter notes, upcoming appointments, etc.  Non-urgent messages can be sent to your provider as well.   To learn more  about what you can do with MyChart, go to [email or web address].    Your next appointment:   1 month(s)  The format for your next appointment:   In Person  Provider:   Diona Browner, NP        Other Instructions   Important Information About Sugar

## 2022-07-01 ENCOUNTER — Telehealth: Payer: Self-pay | Admitting: Nurse Practitioner

## 2022-07-01 NOTE — Telephone Encounter (Signed)
Returned call to pt. No answer. Left msg to return call.

## 2022-07-01 NOTE — Telephone Encounter (Signed)
Patient called to talk with Diona Browner about [facility].

## 2022-07-01 NOTE — Telephone Encounter (Signed)
Patient called to talk with Diona Browner about [facility].

## 2022-07-03 ENCOUNTER — Telehealth: Payer: Self-pay

## 2022-07-03 NOTE — Telephone Encounter (Signed)
Patient is returning call.  °

## 2022-07-03 NOTE — Telephone Encounter (Signed)
Patient called triage asking for me. She stated she has the codes for her echo and wanted me to call for pricing. I explained that triage nurses do not make those calls, for her to contatc her insurance company.

## 2022-07-03 NOTE — Telephone Encounter (Signed)
Patient asked for the dose of Entresto she is to take. Informed patient she is to take [facility] 24-26 one tablet twice daily. Patient stated she used to take 1/2 of that twice daily. She told me her prior prescription expired in early 2023 and that she was still using it due to cost. Advise patient to check with her pharmacy to ensure the medication was still good to take. Reviewed again with patient the correct dose of Entresto 24-26 twice daily. Patient then asked if it was alright to take ASA with entresto. She also takes '200mg'$  ibuprofen each morning and '400mg'$  if playing golf or tennis. Patient education provided on NSAIDs. Please advise on daily ASA with taking entresto.

## 2022-07-04 NOTE — Telephone Encounter (Signed)
Patient informed per Carmelia Roller, NP that is it okay to be taking ASA while on Entresto. Patient verbalized undersanding.

## 2022-07-09 ENCOUNTER — Ambulatory Visit (HOSPITAL_COMMUNITY): Payer: PPO | Attending: Nurse Practitioner

## 2022-07-09 DIAGNOSIS — I350 Nonrheumatic aortic (valve) stenosis: Secondary | ICD-10-CM | POA: Diagnosis not present

## 2022-07-09 LAB — ECHOCARDIOGRAM COMPLETE
AR max vel: 1.16 cm2
AV Area VTI: 1.19 cm2
AV Area mean vel: 1.09 cm2
AV Mean grad: 18.5 mmHg
AV Peak grad: 32 mmHg
Ao pk vel: 2.83 m/s
Area-P 1/2: 3.89 cm2
P 1/2 time: 554 msec
S' Lateral: 3.4 cm

## 2022-07-11 DIAGNOSIS — R0609 Other forms of dyspnea: Secondary | ICD-10-CM | POA: Diagnosis not present

## 2022-07-11 DIAGNOSIS — Z950 Presence of cardiac pacemaker: Secondary | ICD-10-CM | POA: Diagnosis not present

## 2022-07-11 DIAGNOSIS — R002 Palpitations: Secondary | ICD-10-CM | POA: Diagnosis not present

## 2022-07-11 DIAGNOSIS — I350 Nonrheumatic aortic (valve) stenosis: Secondary | ICD-10-CM | POA: Diagnosis not present

## 2022-07-11 DIAGNOSIS — I5042 Chronic combined systolic (congestive) and diastolic (congestive) heart failure: Secondary | ICD-10-CM | POA: Diagnosis not present

## 2022-07-11 DIAGNOSIS — E785 Hyperlipidemia, unspecified: Secondary | ICD-10-CM | POA: Diagnosis not present

## 2022-07-11 DIAGNOSIS — I428 Other cardiomyopathies: Secondary | ICD-10-CM | POA: Diagnosis not present

## 2022-07-11 DIAGNOSIS — Z952 Presence of prosthetic heart valve: Secondary | ICD-10-CM | POA: Diagnosis not present

## 2022-07-11 DIAGNOSIS — I2510 Atherosclerotic heart disease of native coronary artery without angina pectoris: Secondary | ICD-10-CM | POA: Diagnosis not present

## 2022-07-11 DIAGNOSIS — I442 Atrioventricular block, complete: Secondary | ICD-10-CM | POA: Diagnosis not present

## 2022-07-11 LAB — BASIC METABOLIC PANEL
BUN/Creatinine Ratio: 24 (ref 12–28)
BUN: 23 mg/dL (ref 8–27)
CO2: 26 mmol/L (ref 20–29)
Calcium: 9.5 mg/dL (ref 8.7–10.3)
Chloride: 102 mmol/L (ref 96–106)
Creatinine, Ser: 0.96 mg/dL (ref 0.57–1.00)
Glucose: 103 mg/dL — ABNORMAL HIGH (ref 70–99)
Potassium: 4.6 mmol/L (ref 3.5–5.2)
Sodium: 141 mmol/L (ref 134–144)
eGFR: 61 mL/min/{1.73_m2} (ref >59)

## 2022-07-15 DIAGNOSIS — H43391 Other vitreous opacities, right eye: Secondary | ICD-10-CM | POA: Diagnosis not present

## 2022-07-15 DIAGNOSIS — H40013 Open angle with borderline findings, low risk, bilateral: Secondary | ICD-10-CM | POA: Diagnosis not present

## 2022-07-15 DIAGNOSIS — Z961 Presence of intraocular lens: Secondary | ICD-10-CM | POA: Diagnosis not present

## 2022-07-15 DIAGNOSIS — H25812 Combined forms of age-related cataract, left eye: Secondary | ICD-10-CM | POA: Diagnosis not present

## 2022-07-17 ENCOUNTER — Telehealth: Payer: Self-pay

## 2022-07-17 NOTE — Telephone Encounter (Signed)
Lmom, waiting on a return call to discuss echo results.

## 2022-07-30 ENCOUNTER — Ambulatory Visit: Payer: PPO | Admitting: Nurse Practitioner

## 2022-08-27 ENCOUNTER — Ambulatory Visit (INDEPENDENT_AMBULATORY_CARE_PROVIDER_SITE_OTHER): Payer: PPO

## 2022-08-27 DIAGNOSIS — I428 Other cardiomyopathies: Secondary | ICD-10-CM | POA: Diagnosis not present

## 2022-08-27 LAB — CUP PACEART REMOTE DEVICE CHECK
Battery Remaining Longevity: 120 mo
Battery Remaining Percentage: 100 %
Brady Statistic RA Percent Paced: 0 %
Brady Statistic RV Percent Paced: 39 %
Date Time Interrogation Session: 20231226013400
Implantable Lead Connection Status: 753985
Implantable Lead Connection Status: 753985
Implantable Lead Connection Status: 753985
Implantable Lead Implant Date: 20200311
Implantable Lead Implant Date: 20200311
Implantable Lead Implant Date: 20200311
Implantable Lead Location: 753858
Implantable Lead Location: 753859
Implantable Lead Location: 753860
Implantable Lead Model: 4674
Implantable Lead Model: 7741
Implantable Lead Model: 7742
Implantable Lead Serial Number: 1022279
Implantable Lead Serial Number: 1098426
Implantable Lead Serial Number: 830411
Implantable Pulse Generator Implant Date: 20200311
Lead Channel Impedance Value: 654 Ohm
Lead Channel Impedance Value: 937 Ohm
Lead Channel Impedance Value: 979 Ohm
Lead Channel Pacing Threshold Amplitude: 0.6 V
Lead Channel Pacing Threshold Amplitude: 0.7 V
Lead Channel Pacing Threshold Amplitude: 1.6 V
Lead Channel Pacing Threshold Pulse Width: 0.4 ms
Lead Channel Pacing Threshold Pulse Width: 0.4 ms
Lead Channel Pacing Threshold Pulse Width: 0.4 ms
Lead Channel Setting Pacing Amplitude: 2 V
Lead Channel Setting Pacing Amplitude: 2.5 V
Lead Channel Setting Pacing Amplitude: 2.5 V
Lead Channel Setting Pacing Pulse Width: 0.4 ms
Lead Channel Setting Pacing Pulse Width: 0.4 ms
Lead Channel Setting Sensing Sensitivity: 2.5 mV
Lead Channel Setting Sensing Sensitivity: 2.5 mV
Pulse Gen Serial Number: 747113
Zone Setting Status: 755011

## 2022-09-06 ENCOUNTER — Ambulatory Visit: Payer: PPO | Attending: Internal Medicine | Admitting: Internal Medicine

## 2022-09-06 ENCOUNTER — Encounter: Payer: Self-pay | Admitting: Internal Medicine

## 2022-09-06 VITALS — BP 118/76 | HR 68 | Ht 66.0 in | Wt 156.0 lb

## 2022-09-06 DIAGNOSIS — I442 Atrioventricular block, complete: Secondary | ICD-10-CM

## 2022-09-06 DIAGNOSIS — Z950 Presence of cardiac pacemaker: Secondary | ICD-10-CM | POA: Diagnosis not present

## 2022-09-06 LAB — CUP PACEART INCLINIC DEVICE CHECK
Date Time Interrogation Session: 20240105165334
Implantable Lead Connection Status: 753985
Implantable Lead Connection Status: 753985
Implantable Lead Connection Status: 753985
Implantable Lead Implant Date: 20200311
Implantable Lead Implant Date: 20200311
Implantable Lead Implant Date: 20200311
Implantable Lead Location: 753858
Implantable Lead Location: 753859
Implantable Lead Location: 753860
Implantable Lead Model: 4674
Implantable Lead Model: 7741
Implantable Lead Model: 7742
Implantable Lead Serial Number: 1022279
Implantable Lead Serial Number: 1098426
Implantable Lead Serial Number: 830411
Implantable Pulse Generator Implant Date: 20200311
Lead Channel Setting Pacing Amplitude: 2 V
Lead Channel Setting Pacing Amplitude: 2.2 V
Lead Channel Setting Pacing Amplitude: 2.5 V
Lead Channel Setting Pacing Pulse Width: 0.4 ms
Lead Channel Setting Pacing Pulse Width: 0.6 ms
Lead Channel Setting Sensing Sensitivity: 2.5 mV
Lead Channel Setting Sensing Sensitivity: 2.5 mV
Pulse Gen Serial Number: 747113
Zone Setting Status: 755011

## 2022-09-06 NOTE — Patient Instructions (Signed)
Medication Instructions:  Your physician recommends that you continue on your current medications as directed. Please refer to the Current Medication list given to you today.  *If you need a refill on your cardiac medications before your next appointment, please call your pharmacy*  Lab Work: None ordered.  If you have labs (blood work) drawn today and your tests are completely normal, you will receive your results only by: [place] (if you have MyChart) OR A paper copy in the mail If you have any lab test that is abnormal or we need to change your treatment, we will call you to review the results.  Testing/Procedures: None ordered.  Follow-Up: At [facility], you and your health needs are our priority.  As part of our continuing mission to provide you with exceptional heart care, we have created designated Provider Care Teams.  These Care Teams include your primary Cardiologist (physician) and Advanced Practice Providers (APPs -  Physician Assistants and Nurse Practitioners) who all work together to provide you with the care you need, when you need it.  We recommend signing up for the patient portal called "MyChart".  Sign up information is provided on this After Visit Summary.  MyChart is used to connect with patients for Virtual Visits (Telemedicine).  Patients are able to view lab/test results, encounter notes, upcoming appointments, etc.  Non-urgent messages can be sent to your provider as well.   To learn more about what you can do with MyChart, go to [email or web address].    Your next appointment:   1 year(s)  The format for your next appointment:   In Person  Provider:   Cristopher Peru, MD{or one of the following Advanced Practice Providers on your designated Care Team:   Tommye Standard, [place] Legrand Como "Jonni Sanger" Chalmers Cater, [place]  Remote monitoring is used to monitor your Pacemaker from home. This monitoring reduces the number of office visits required to check your device to  one time per year. It allows [place] to keep an eye on the functioning of your device to ensure it is working properly. You are scheduled for a device check from home on 11/26/22. You may send your transmission at any time that day. If you have a wireless device, the transmission will be sent automatically. After your physician reviews your transmission, you will receive a postcard with your next transmission date.  Important Information About Sugar

## 2022-09-06 NOTE — Progress Notes (Signed)
HPI Brittany Shaffer returns today for followup. She is a pleasant 77 yo woman with AS, s/p TAVR, complicated by CHB. Her conduction ultimately recovered after her PPM. She has developed worsening sob over the last several months. She has some increase in her mean gradient by echo on her TAVR valve. She notes that she has class 2B symptoms. She denies chest pain or syncope. No edema. After her conduction improved we turned her AV delays up to avoid pacing.  Allergies  Allergen Reactions   Codeine Other (See Comments)    Hallucinations   Plavix [Clopidogrel Bisulfate] Rash     Current Outpatient Medications  Medication Sig Dispense Refill   acetaminophen (TYLENOL) 500 MG tablet Take 500-1,000 mg by mouth every 6 (six) hours as needed for moderate pain.     amoxicillin (AMOXIL) 500 MG tablet Take 4 tablets (2,000 mg total) by mouth as directed. 1 hour prior to dental work including cleanings 12 tablet 12   ASPIRIN LOW DOSE 81 MG EC tablet TAKE 1 TABLET BY MOUTH EVERY DAY 90 tablet 3   diclofenac sodium (VOLTAREN) 1 % GEL Apply 1 application topically 4 (four) times daily as needed (pain).     ibuprofen (ADVIL) 200 MG tablet Take 200 mg by mouth every 6 (six) hours as needed.     Menthol-Methyl Salicylate (MUSCLE RUB EX) Apply 1 application topically daily as needed (muscle pain).     sacubitril-valsartan (ENTRESTO) 24-26 MG Take 1 tablet by mouth 2 (two) times daily. 60 tablet 3   UNABLE TO FIND Hyland's Leg Cramps tablets: Take 1 tablet by mouth during the night as needed for leg cramps     zolpidem (AMBIEN) 10 MG tablet Take 1 tablet (10 mg total) by mouth at bedtime as needed. For sleep 30 tablet 1   No current facility-administered medications for this visit.     Past Medical History:  Diagnosis Date   Abnormality of gait 10/20/2014   Arthritis    "was in my hips; still in my knees" (01/13/2018)   Bicuspid aortic valve    Chronic combined systolic and diastolic heart failure (HCC)     DJD (degenerative joint disease), ankle and foot 10/20/2014   HAMMER TOE, ACQUIRED 07/28/2007   Qualifier: Diagnosis of  By: Copland MD, Spencer     HLD (hyperlipidemia) 01/14/2018   Metatarsalgia of left foot 10/20/2014   Mild coronary artery disease 01/14/2018   LAD with 50% stenosis on LHC 12/2017   Mitral regurgitation    OA (osteoarthritis) of hip 03/24/2013   PLANTAR FASCIITIS 07/28/2007   Qualifier: Diagnosis of  By: Lorelei Pont MD, Spencer     RBBB    S/P TAVR (transcatheter aortic valve replacement) 11/10/2018   26 mm Edwards Sapien 3 transcatheter heart valve placed via percutaneous right transfemoral approach    Severe aortic stenosis    Varicose veins of bilateral lower extremities with other complications 10/04/252    ROS:   All systems reviewed and negative except as noted in the HPI.   Past Surgical History:  Procedure Laterality Date   ABDOMINAL HYSTERECTOMY  03/03/2012   Procedure: HYSTERECTOMY ABDOMINAL;  Surgeon: Alvino Chapel, MD;  Location: [facility];  Service: Gynecology;  Laterality: N/A;   BIV PACEMAKER INSERTION CRT-P N/A 11/11/2018   Procedure: BIV PACEMAKER INSERTION CRT-P;  Surgeon: Evans Lance, MD;  Location: [place] [facility];  Service: Cardiovascular;  Laterality: N/A;   BLEPHAROPLASTY Bilateral    JOINT REPLACEMENT  LAPAROTOMY  03/03/2012   Procedure: EXPLORATORY LAPAROTOMY;  Surgeon: Alvino Chapel, MD;  Location: [facility];  Service: Gynecology;  Laterality: N/A; 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   RIGHT/LEFT HEART CATH AND CORONARY ANGIOGRAPHY N/A 01/14/2018   Procedure: RIGHT/LEFT HEART CATH AND CORONARY ANGIOGRAPHY;  Surgeon: Wellington Hampshire, MD;  Location: [place] [facility];  Service: Cardiovascular;  Laterality: N/A;   RIGHT/LEFT HEART CATH AND CORONARY ANGIOGRAPHY N/A 10/23/2018   Procedure: RIGHT/LEFT HEART CATH AND CORONARY ANGIOGRAPHY;  Surgeon: [name], [name], MD;  Location: [place] [facility];  Service: Cardiovascular;   Laterality: N/A;   SALPINGOOPHORECTOMY  03/03/2012   Procedure: SALPINGO OOPHERECTOMY;  Surgeon: Alvino Chapel, MD;  Location: [facility];  Service: Gynecology;  Laterality: Bilateral;   TEE WITHOUT CARDIOVERSION N/A 11/10/2018   Procedure: TRANSESOPHAGEAL ECHOCARDIOGRAM (TEE);  Surgeon: Sherren Mocha, MD;  Location: [place] [facility];  Service: Open Heart Surgery;  Laterality: N/A;   TONSILLECTOMY     TOTAL HIP ARTHROPLASTY Left 03/24/2013   Procedure: LEFT TOTAL HIP ARTHROPLASTY ANTERIOR APPROACH;  Surgeon: [name], MD;  Location: [facility];  Service: Orthopedics;  Laterality: Left;   TOTAL HIP ARTHROPLASTY Right 12/18/2016   Procedure: RIGHT TOTAL HIP ARTHROPLASTY ANTERIOR APPROACH;  Surgeon: Gaynelle Arabian, MD;  Location: [facility];  Service: Orthopedics;  Laterality: Right;   TRANSCATHETER AORTIC VALVE REPLACEMENT, TRANSFEMORAL N/A 11/10/2018   Procedure: TRANSCATHETER AORTIC VALVE REPLACEMENT, TRANSFEMORAL;  Surgeon: Sherren Mocha, MD;  Location: [place] [facility];  Service: Open Heart Surgery;  Laterality: N/A;   TUBAL LIGATION       Family History  Problem Relation Age of Onset   Hypertension Mother    Bone cancer Father    Lung cancer Father    Ovarian cancer Sister    Diabetes Maternal Aunt    Breast cancer Maternal Aunt        Age 1's     Social History   Socioeconomic History   Marital status: Widowed    Spouse name: Not on file   Number of children: Not on file   Years of education: Not on file   Highest education level: Not on file  Occupational History   Not on file  Tobacco Use   Smoking status: Never   Smokeless tobacco: Never  Vaping Use   Vaping Use: Never used  Substance and Sexual Activity   Alcohol use: Yes    Alcohol/week: 7.0 standard drinks of alcohol    Types: 7 Standard drinks or equivalent per week    Comment: 1 / PER DAY   Drug use: No   Sexual activity: Not Currently    Birth control/protection: Surgical    Comment: 1st  intercourse 77 yo-Fewer than 5 partners  Other Topics Concern   Not on file  Social History Narrative   Lives locally.  Retired. Active - walks frequently and plays tennis regularly.   Social Determinants of Health   Financial Resource Strain: Not on file  Food Insecurity: Not on file  Transportation Needs: Not on file  Physical Activity: Not on file  Stress: Not on file  Social Connections: Not on file  Intimate Partner Violence: Not on file     BP 118/76   Pulse 68   Ht '5\' 6"'$  (1.676 m)   Wt 156 lb (70.8 kg)   SpO2 96%   BMI 25.18 kg/m   Physical Exam:  Well appearing NAD HEENT: Unremarkable Neck:  No JVD, no thyromegally Lymphatics:  No  adenopathy Back:  No CVA tenderness Lungs:  Clear with no wheezes HEART:  Regular rate rhythm, no murmurs, no rubs, no clicks Abd:  soft, positive bowel sounds, no organomegally, no rebound, no guarding Ext:  2 plus pulses, no edema, no cyanosis, no clubbing Skin:  No rashes no nodules Neuro:  CN II through XII intact, motor grossly intact  EKG - nsr with rbbb  DEVICE  Normal device function.  See PaceArt for details.   Assess/Plan: CHB - she has had return of her AV conduction. She is pacing only 10%. Today I shortened her AV delays to have her biv pace.  2. PPM - her [employer] DDD PM is working normally. We will recheck in several months. Today I turned on her rate response. I have her walk in the halls and her pulse ox got up to 113.  3. AS - she is asymptomatic, s/p TAVR. 4. Dyspnea - I hope that her rate increase will help. She did drop her oxygen sat walking around the hall and this would raise the question about possible underlying lung disease. I'll defer to Dr. [name] as to whether or not to evaluate this further with PFT's.  Brittany Sinner Jantz Main,MD.

## 2022-09-10 ENCOUNTER — Telehealth: Payer: Self-pay

## 2022-09-10 NOTE — Patient Outreach (Signed)
  Care Coordination   Initial Visit Note   09/10/2022 Name: Brittany Shaffer MRN: [id number] DOB: [birth year]  Brittany Shaffer is a 77 y.o. year old female who sees Brittany Cruel, MD for primary care. I spoke with  Brittany Shaffer by phone today.  What matters to the patients health and wellness today?  I am doing good today.  I am followed by my cardiologist closely and I do not feel I need any help at this time    Goals Addressed             This Visit's Progress    COMPLETED: Care Coordination Activities - no follow up required       Care Coordination Interventions: Provided education to patient re: care coordination services, annual wellness visit Assessed social determinant of health barriers No further interventions needed           SDOH assessments and interventions completed:  Yes  SDOH Interventions Today    Flowsheet Row Most Recent Value  SDOH Interventions   Food Insecurity Interventions Intervention Not Indicated  Housing Interventions Intervention Not Indicated  Transportation Interventions Intervention Not Indicated  Utilities Interventions Intervention Not Indicated        Care Coordination Interventions:  Yes, provided   Follow up plan: No further intervention required.   Encounter Outcome:  Pt. Visit Completed  Peter Garter RN, BSN,CCM, CDE Care Management Coordinator [facility] [place] [facility] [phone]

## 2022-09-10 NOTE — Patient Instructions (Signed)
Visit Information  Thank you for taking time to visit with me today. Please don't hesitate to contact me if I can be of assistance to you.   Following are the goals we discussed today:   Goals Addressed             This Visit's Progress    COMPLETED: Care Coordination Activities - no follow up required       Care Coordination Interventions: Provided education to patient re: care coordination services, annual wellness visit Assessed social determinant of health barriers No further interventions needed           If you are experiencing a Mental Health or [place] or need someone to talk to, please call the Suicide and Crisis Lifeline: 988 call the [place] National Suicide Prevention Lifeline: 929-780-1857 or TTY: 410-454-5995 TTY 6466003624) to talk to a trained counselor call 1-800-273-TALK (toll free, 24 hour hotline) go to [facility] [place] [phone]) call 911   Patient verbalizes understanding of instructions and care plan provided today and agrees to view in [place]. Active MyChart status and patient understanding of how to access instructions and care plan via MyChart confirmed with patient.     No further follow up required:    Peter Garter RN, Jackquline [place], [place] [facility] [phone]

## 2022-09-10 NOTE — Patient Outreach (Signed)
  Care Coordination   09/10/2022 Name: Brittany Shaffer MRN: [id number] DOB: [birth year]   Care Coordination Outreach Attempts:  An unsuccessful telephone outreach was attempted today to offer the patient information about available care coordination services as a benefit of their health plan.   Follow Up Plan:  Additional outreach attempts will be made to offer the patient care coordination information and services.   Encounter Outcome:  No Answer   Care Coordination Interventions:  No, not indicated    Peter Garter RN, BSN,CCM, CDE Care Management Coordinator [place] [facility] [phone]

## 2022-09-17 NOTE — Progress Notes (Signed)
Cardiology Office Note   Date:  09/27/2022   ID:  Baylor, Teegarden 06-14-1946, MRN [id number]  PCP:  [name], MD  Cardiologist:  Herma Uballe [name], MD EP: None  Chief Complaint  Patient presents with   Shortness of Breath   Palpitations       History of Present Illness: Brittany Shaffer is a 77 y.o. female with a PMH of chronic combined CHF, AS s/p TAVR, CHB s/p PPM, HLD, who presents for  follow-up.  In 2019  she was admitted  for CP and SOB. She had an echocardiogram which showed EF 50-55%, mild pulmHTN, and moderate aortic stenosis. She underwent a LHC that admission showing near normal coronary arteries with only moderate apical LAD stenosis (50%), mild AS, and moderate MR. She had a repeat echocardiogram in 10/2018 which showed significant drop in EF to 25-30% with restrictive filling parameters, severe pulmHTN, and low flow, low gradient severe aortic stenosis. Repeat cardiac catheterization showed improvement in non-obstructive CAD (30% dLAD stenosis) and moderate-severe low gradient AS with some improvement in EF to 35-45%. She underwent TAVR 0/3009 which was complicated by intraprocedureal CHB managed with CRT-P placed by Dr. Lovena Le. Subsequent device monitoring showed paroxysmal atrial fibrillation with burden <1% and all episodes <20 minutes. Her last echocardiogram 11/2019 showed EF 45-50%, RWMA in the basal region, G2DD, sp TAVR with mild per-valvular regurgitation, mild-moderate TR, mild RAE, and moderate LAE.   Pacer check 08/28/21 showed normal function. No Afib. Repeat Echo September 2022 showed no change. EF 45-50%. Mild perivalvular TAVR leak. No AS. Mild pulmonary HTN.   She notes that in December she had a bad cough that lasted 4 weeks. No fever. Finally resolved. Now back walking and playing tennis. She is getting injections in knee for pain. Tries and limits NSAIDs with some renal insufficiency.  She contacted our office on 06/20/2022 and reported  palpitations while playing tennis 2 days prior. She noted associated back pain and weakness in her legs.  Remote pacer transmission showed normal device function, no evidence of arrhythmia.  She contacted our office again on 06/26/2022 and noted that she had palpitations once again after drinking her coffee with collagen added to it. Follow up device check on Jan 4 showed normal function with no arrhythmia. She did see Dr Lovena Le at that time. It was noted that she wasn't pacing much so AV delay was decreased to allow more BiV pacing and her rate responsiveness was increased. Since these changes she has noted no change in her symptoms.  She tells me that she hasn't felt well since she had an URI in Dec 2022. Since she notes she is SOB with activity. No cough. Feels heart pounding and legs and back hurt. Also worse when doing yard work or walking up hill. She quit playing tennis because of this. She did have an Echo in November that looked good. She reduced her Entresto by half without any change. She notes some shooting pain in right hand and tingling.    Past Medical History:  Diagnosis Date   Abnormality of gait 10/20/2014   Arthritis    "was in my hips; still in my knees" (01/13/2018)   Bicuspid aortic valve    Chronic combined systolic and diastolic heart failure (HCC)    DJD (degenerative joint disease), ankle and foot 10/20/2014   HAMMER TOE, ACQUIRED 07/28/2007   Qualifier: Diagnosis of  By: Copland MD, Spencer     HLD (hyperlipidemia) 01/14/2018  Metatarsalgia of left foot 10/20/2014   Mild coronary artery disease 01/14/2018   LAD with 50% stenosis on [facility] 12/2017   Mitral regurgitation    OA (osteoarthritis) of hip 03/24/2013   PLANTAR FASCIITIS 07/28/2007   Qualifier: Diagnosis of  By: Copland MD, Spencer     RBBB    S/P TAVR (transcatheter aortic valve replacement) 11/10/2018   26 mm Edwards Sapien 3 transcatheter heart valve placed via percutaneous right transfemoral approach    Severe  aortic stenosis    Varicose veins of bilateral lower extremities with other complications 02/07/1274    Past Surgical History:  Procedure Laterality Date   ABDOMINAL HYSTERECTOMY  03/03/2012   Procedure: HYSTERECTOMY ABDOMINAL;  Surgeon: Alvino Chapel, MD;  Location: [facility];  Service: Gynecology;  Laterality: N/A;   BIV PACEMAKER INSERTION CRT-P N/A 11/11/2018   Procedure: BIV PACEMAKER INSERTION CRT-P;  Surgeon: Evans Lance, MD;  Location: [place] [facility];  Service: Cardiovascular;  Laterality: N/A;   BLEPHAROPLASTY Bilateral    JOINT REPLACEMENT     LAPAROTOMY  03/03/2012   Procedure: EXPLORATORY LAPAROTOMY;  Surgeon: Alvino Chapel, MD;  Location: [facility];  Service: Gynecology;  Laterality: N/A; 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   RIGHT/LEFT HEART CATH AND CORONARY ANGIOGRAPHY N/A 01/14/2018   Procedure: RIGHT/LEFT HEART CATH AND CORONARY ANGIOGRAPHY;  Surgeon: Wellington Hampshire, MD;  Location: [place] [facility];  Service: Cardiovascular;  Laterality: N/A;   RIGHT/LEFT HEART CATH AND CORONARY ANGIOGRAPHY N/A 10/23/2018   Procedure: RIGHT/LEFT HEART CATH AND CORONARY ANGIOGRAPHY;  Surgeon: [name], English Craighead M, MD;  Location: [place] [facility];  Service: Cardiovascular;  Laterality: N/A;   SALPINGOOPHORECTOMY  03/03/2012   Procedure: SALPINGO OOPHERECTOMY;  Surgeon: Alvino Chapel, MD;  Location: [facility];  Service: Gynecology;  Laterality: Bilateral;   TEE WITHOUT CARDIOVERSION N/A 11/10/2018   Procedure: TRANSESOPHAGEAL ECHOCARDIOGRAM (TEE);  Surgeon: Sherren Mocha, MD;  Location: [place] [facility];  Service: Open Heart Surgery;  Laterality: N/A;   TONSILLECTOMY     TOTAL HIP ARTHROPLASTY Left 03/24/2013   Procedure: LEFT TOTAL HIP ARTHROPLASTY ANTERIOR APPROACH;  Surgeon: [name], MD;  Location: [facility];  Service: Orthopedics;  Laterality: Left;   TOTAL HIP ARTHROPLASTY Right 12/18/2016   Procedure: RIGHT TOTAL HIP ARTHROPLASTY ANTERIOR APPROACH;  Surgeon: Gaynelle Arabian, MD;  Location: [facility];  Service: Orthopedics;  Laterality: Right;   TRANSCATHETER AORTIC VALVE REPLACEMENT, TRANSFEMORAL N/A 11/10/2018   Procedure: TRANSCATHETER AORTIC VALVE REPLACEMENT, TRANSFEMORAL;  Surgeon: Sherren Mocha, MD;  Location: [place] [facility];  Service: Open Heart Surgery;  Laterality: N/A;   TUBAL LIGATION       Current Outpatient Medications  Medication Sig Dispense Refill   acetaminophen (TYLENOL) 500 MG tablet Take 500-1,000 mg by mouth every 6 (six) hours as needed for moderate pain.     amoxicillin (AMOXIL) 500 MG tablet Take 4 tablets (2,000 mg total) by mouth as directed. 1 hour prior to dental work including cleanings 12 tablet 12   ASPIRIN LOW DOSE 81 MG EC tablet TAKE 1 TABLET BY MOUTH EVERY DAY 90 tablet 3   diclofenac sodium (VOLTAREN) 1 % GEL Apply 1 application topically 4 (four) times daily as needed (pain).     ibuprofen (ADVIL) 200 MG tablet Take 200 mg by mouth every 6 (six) hours as needed.     Menthol-Methyl Salicylate (MUSCLE RUB EX) Apply 1 application topically daily as needed (muscle pain).     sacubitril-valsartan (ENTRESTO) 24-26 MG  Take 1 tablet by mouth 2 (two) times daily. (Patient taking differently: Take 0.5 tablets by mouth 2 (two) times daily.) 60 tablet 3   UNABLE TO FIND Hyland's Leg Cramps tablets: Take 1 tablet by mouth during the night as needed for leg cramps     zolpidem (AMBIEN) 10 MG tablet Take 1 tablet (10 mg total) by mouth at bedtime as needed. For sleep 30 tablet 1   No current facility-administered medications for this visit.    Allergies:   Codeine and Plavix [clopidogrel bisulfate]    Social History:  The patient  reports that she has never smoked. She has never used smokeless tobacco. She reports current alcohol use of about 7.0 standard drinks of alcohol per week. She reports that she does not use drugs.   Family History:  The patient's family history includes Bone cancer in her father; Breast cancer in her  maternal aunt; Diabetes in her maternal aunt; Hypertension in her mother; Lung cancer in her father; Ovarian cancer in her sister.    ROS:  Please see the history of present illness.   Otherwise, review of systems are positive for none.   All other systems are reviewed and negative.    PHYSICAL EXAM: VS:  BP 130/72   Pulse 70   Ht 5' 6.5" (1.689 m)   Wt 156 lb (70.8 kg)   SpO2 99%   BMI 24.80 kg/m  , BMI Body mass index is 24.8 kg/m. GEN: Well nourished, well developed, in no acute distress HEENT: normal Neck: no JVD, carotid bruits, or masses Cardiac: RRR; no murmurs, rubs, or gallops,no edema  Respiratory:  clear to auscultation bilaterally, normal work of breathing GI: soft, nontender, nondistended, + BS MS: no deformity or atrophy Skin: warm and dry, no rash Neuro:  Strength and sensation are intact Psych: euthymic mood, full affect   EKG:  EKG not ordered today.     Recent Labs: 07/11/2022: BUN 23; Creatinine, Ser 0.96; Potassium 4.6; Sodium 141    Lipid Panel    Component Value Date/Time   CHOL 193 01/14/2018 0212   TRIG 55 01/14/2018 0212   HDL 85 01/14/2018 0212   CHOLHDL 2.3 01/14/2018 0212   VLDL 11 01/14/2018 0212   LDLCALC 97 01/14/2018 0212   Dated 11/06/20: cholesterol 244, triglycerides 57, HDL 103, LDL 132. CMET normal Dated 11/08/21: cholesterol 236, triglycerides 73, HDL 111, LDL 113. CMET normal.   Wt Readings from Last 3 Encounters:  09/27/22 156 lb (70.8 kg)  09/06/22 156 lb (70.8 kg)  06/27/22 156 lb 6.4 oz (70.9 kg)      Other studies Reviewed: Additional studies/ records that were reviewed today include:   Echocardiogram 11/2019: 1. Left ventricular ejection fraction, by estimation, is 45 to 50%. The  left ventricle has mildly decreased function. The left ventricle  demonstrates regional wall motion abnormalities. Basal anterolateral,  basal inferolateral, and basal inferior severe   hypokinesis. Left ventricular diastolic parameters are  consistent with  Grade II diastolic dysfunction (pseudonormalization).   2. Right ventricular systolic function is normal. The right ventricular  size is mildly enlarged. There is normal pulmonary artery systolic  pressure. The estimated right ventricular systolic pressure is 50.5 mmHg.   3. 26 mm Edwards Sapien bioprosthetic aortic valve s/p TAVR. Mild,  eccentric peri-valvular regurgitation. Mean gradient 15 mmHg with  calculated AVA 1.04 cm^2.   4. The mitral valve is normal in structure. Trivial mitral valve  regurgitation. No evidence of mitral stenosis.  5. Tricuspid valve regurgitation is mild to moderate.   6. Right atrial size was mildly dilated.   7. Left atrial size was moderately dilated.   8. The inferior vena cava is normal in size with greater than 50%  respiratory variability, suggesting right atrial pressure of 3 mmHg.   R/LHC 10/2018: Dist LAD lesion is 30% stenosed. There is moderate left ventricular systolic dysfunction. The left ventricular ejection fraction is 35-45% by visual estimate. LV end diastolic pressure is normal. There is moderate aortic valve stenosis.   1. Minimal nonobstructive CAD 2. Moderate LV dysfunction. EF 35-40%. 3. At least moderate AS. Mean gradient 27 mm Hg. AVA may be underestimated due to decreased LV function 4. Normal LV filling pressures. 5. Normal right heart pressures.   Plan: will refer to Valvular heart team for consideration of TAVR.   Echo 05/31/21: IMPRESSIONS     1. Left ventricular ejection fraction, by estimation, is 45 to 50%. The  left ventricle has mildly decreased function. The left ventricle  demonstrates regional wall motion abnormalities (see scoring  diagram/findings for description). Left ventricular  diastolic parameters are consistent with Grade I diastolic dysfunction  (impaired relaxation).   2. Right ventricular systolic function is normal. The right ventricular  size is mildly enlarged. There is mildly  elevated pulmonary artery  systolic pressure. The estimated right ventricular systolic pressure is  26.8 mmHg.   3. The mitral valve is normal in structure. No evidence of mitral valve  regurgitation. No evidence of mitral stenosis.   4. Tricuspid valve regurgitation is moderate.   5. Mild perivavlular leak (ventricular septal side). The aortic valve has  been repaired/replaced. Aortic valve regurgitation is not visualized. No  aortic stenosis is present. There is a 26 mm Sapien prosthetic (TAVR)  valve present in the aortic  position. Procedure Date: March 2020. Echo findings are consistent with  perivalvular leak of the aortic prosthesis. Aortic valve mean gradient  measures 10.8 mmHg. Aortic valve Vmax measures 2.19 m/s.   6. The inferior vena cava is dilated in size with >50% respiratory  variability, suggesting right atrial pressure of 8 mmHg.   Comparison(s): No significant change from prior study. Prior images  reviewed side by side.   ASSESSMENT AND PLAN:  1. Chronic combined CHF/NICM:   - EF was mildly reduced 45-50%. More recent Echo shows improvement to 55%.  - no volume overload  - on low dose Entresto- unable to tolerate due to low BP  - Continue to encourage monitoring daily weights and limiting salt intake  2. Non-obstructive CAD: mild distal LAD disease noted on [facility] 10/2018. No anginal complaints.   3. Severe aortic stenosis s/p TAVR 11/2018: stable on last echo  Nov 2023 with mild perivalvular leak noted.  - Continue aspirin - Continue SBE ppx with amoxicillin  4. CHB s/p PPM: normal device function without abnormalities noted on last device interrogation Jan 2024 - some parameters changed to increase BiV pacing.  monitoring per Dr. Lovena Le  5. Dyspnea on exertion. I don't feel this is related to CAD, CHF, or arrhythmia based on above data. She did have PFTs done in Jan 2020 showing mod restrictive defect and decreased diffusion capacity. Will update CXR. Will  arrange for cardiopulmonary stress test to try and get a better handle on her limitations.      Disposition:   FU with Dr. [name] in 3  months  Signed, Meher Kucinski [name], MD  09/27/2022 3:39 PM

## 2022-09-18 NOTE — Progress Notes (Signed)
Remote pacemaker transmission.   

## 2022-09-27 ENCOUNTER — Ambulatory Visit
Admission: RE | Admit: 2022-09-27 | Discharge: 2022-09-27 | Disposition: A | Discharge status: Home / Self Care | Payer: PPO | Source: Ambulatory Visit | Attending: Cardiology | Admitting: Cardiology

## 2022-09-27 ENCOUNTER — Encounter: Payer: Self-pay | Admitting: Cardiology

## 2022-09-27 ENCOUNTER — Ambulatory Visit: Payer: PPO | Attending: Cardiology | Admitting: Cardiology

## 2022-09-27 VITALS — BP 130/72 | HR 70 | Ht 66.5 in | Wt 156.0 lb

## 2022-09-27 DIAGNOSIS — R0609 Other forms of dyspnea: Secondary | ICD-10-CM

## 2022-09-27 DIAGNOSIS — Z950 Presence of cardiac pacemaker: Secondary | ICD-10-CM | POA: Diagnosis not present

## 2022-09-27 DIAGNOSIS — Z952 Presence of prosthetic heart valve: Secondary | ICD-10-CM | POA: Diagnosis not present

## 2022-09-27 DIAGNOSIS — I5042 Chronic combined systolic (congestive) and diastolic (congestive) heart failure: Secondary | ICD-10-CM

## 2022-09-27 DIAGNOSIS — I442 Atrioventricular block, complete: Secondary | ICD-10-CM | POA: Diagnosis not present

## 2022-09-27 DIAGNOSIS — R0602 Shortness of breath: Secondary | ICD-10-CM | POA: Diagnosis not present

## 2022-09-27 DIAGNOSIS — I428 Other cardiomyopathies: Secondary | ICD-10-CM

## 2022-09-27 NOTE — Patient Instructions (Addendum)
Medication Instructions:  Your physician recommends that you continue on your current medications as directed. Please refer to the Current Medication list given to you today.  *If you need a refill on your cardiac medications before your next appointment, please call your pharmacy*  Lab Work: NONE ordered at this time of appointment   If you have labs (blood work) drawn today and your tests are completely normal, you will receive your results only by: [facility] (if you have MyChart) OR A paper copy in the mail If you have any lab test that is abnormal or we need to change your treatment, we will call you to review the results.  Testing/Procedures: A chest x-ray takes a picture of the organs and structures inside the chest, including the heart, lungs, and blood vessels. This test can show several things, including, whether the heart is enlarges; whether fluid is building up in the lungs; and whether pacemaker / defibrillator leads are still in place. This test is performed at [facility] [address] [address]  Your physician has recommended that you have a cardiopulmonary stress test (CPX). CPX testing is a non-invasive measurement of heart and lung function. It replaces a traditional treadmill stress test. This type of test provides a tremendous amount of information that relates not only to your present condition but also for future outcomes. This test combines measurements of you ventilation, respiratory gas exchange in the lungs, electrocardiogram (EKG), blood pressure and physical response before, during, and following an exercise protocol.  Follow-Up: At [facility], you and your health needs are our priority.  As part of our continuing mission to provide you with exceptional heart care, we have created designated Provider Care Teams.  These Care Teams include your primary Cardiologist (physician) and Advanced Practice Providers (APPs -  Physician  Assistants and Nurse Practitioners) who all work together to provide you with the care you need, when you need it.  Your next appointment:   3 month(s)  Provider:   Peter [name], MD     Other Instructions

## 2022-10-15 ENCOUNTER — Ambulatory Visit (HOSPITAL_COMMUNITY): Payer: PPO | Attending: Cardiology

## 2022-10-15 DIAGNOSIS — I4510 Unspecified right bundle-branch block: Secondary | ICD-10-CM | POA: Insufficient documentation

## 2022-10-15 DIAGNOSIS — M7742 Metatarsalgia, left foot: Secondary | ICD-10-CM | POA: Insufficient documentation

## 2022-10-15 DIAGNOSIS — I8393 Asymptomatic varicose veins of bilateral lower extremities: Secondary | ICD-10-CM | POA: Insufficient documentation

## 2022-10-15 DIAGNOSIS — M2040 Other hammer toe(s) (acquired), unspecified foot: Secondary | ICD-10-CM | POA: Diagnosis not present

## 2022-10-15 DIAGNOSIS — I2510 Atherosclerotic heart disease of native coronary artery without angina pectoris: Secondary | ICD-10-CM | POA: Insufficient documentation

## 2022-10-15 DIAGNOSIS — Z7982 Long term (current) use of aspirin: Secondary | ICD-10-CM | POA: Insufficient documentation

## 2022-10-15 DIAGNOSIS — I080 Rheumatic disorders of both mitral and aortic valves: Secondary | ICD-10-CM | POA: Insufficient documentation

## 2022-10-15 DIAGNOSIS — R269 Unspecified abnormalities of gait and mobility: Secondary | ICD-10-CM | POA: Diagnosis not present

## 2022-10-15 DIAGNOSIS — M169 Osteoarthritis of hip, unspecified: Secondary | ICD-10-CM | POA: Insufficient documentation

## 2022-10-15 DIAGNOSIS — E785 Hyperlipidemia, unspecified: Secondary | ICD-10-CM | POA: Diagnosis not present

## 2022-10-15 DIAGNOSIS — Z79899 Other long term (current) drug therapy: Secondary | ICD-10-CM | POA: Insufficient documentation

## 2022-10-15 DIAGNOSIS — I509 Heart failure, unspecified: Secondary | ICD-10-CM | POA: Insufficient documentation

## 2022-10-15 DIAGNOSIS — R0609 Other forms of dyspnea: Secondary | ICD-10-CM

## 2022-10-15 DIAGNOSIS — R0600 Dyspnea, unspecified: Secondary | ICD-10-CM

## 2022-10-23 ENCOUNTER — Encounter (HOSPITAL_COMMUNITY): Payer: PPO

## 2022-11-01 ENCOUNTER — Other Ambulatory Visit: Payer: Self-pay | Admitting: Cardiology

## 2022-11-12 DIAGNOSIS — E7800 Pure hypercholesterolemia, unspecified: Secondary | ICD-10-CM | POA: Diagnosis not present

## 2022-11-12 DIAGNOSIS — R7301 Impaired fasting glucose: Secondary | ICD-10-CM | POA: Diagnosis not present

## 2022-11-12 DIAGNOSIS — Z79899 Other long term (current) drug therapy: Secondary | ICD-10-CM | POA: Diagnosis not present

## 2022-11-13 DIAGNOSIS — G4700 Insomnia, unspecified: Secondary | ICD-10-CM | POA: Diagnosis not present

## 2022-11-13 DIAGNOSIS — E7800 Pure hypercholesterolemia, unspecified: Secondary | ICD-10-CM | POA: Diagnosis not present

## 2022-11-13 DIAGNOSIS — Z79899 Other long term (current) drug therapy: Secondary | ICD-10-CM | POA: Diagnosis not present

## 2022-11-13 DIAGNOSIS — R0609 Other forms of dyspnea: Secondary | ICD-10-CM | POA: Diagnosis not present

## 2022-11-13 DIAGNOSIS — I5040 Unspecified combined systolic (congestive) and diastolic (congestive) heart failure: Secondary | ICD-10-CM | POA: Diagnosis not present

## 2022-11-13 DIAGNOSIS — N183 Chronic kidney disease, stage 3 unspecified: Secondary | ICD-10-CM | POA: Diagnosis not present

## 2022-11-13 DIAGNOSIS — R7303 Prediabetes: Secondary | ICD-10-CM | POA: Diagnosis not present

## 2022-11-13 DIAGNOSIS — Z6825 Body mass index (BMI) 25.0-25.9, adult: Secondary | ICD-10-CM | POA: Diagnosis not present

## 2022-11-13 DIAGNOSIS — Z0000 Encounter for general adult medical examination without abnormal findings: Secondary | ICD-10-CM | POA: Diagnosis not present

## 2022-11-13 DIAGNOSIS — I130 Hypertensive heart and chronic kidney disease with heart failure and stage 1 through stage 4 chronic kidney disease, or unspecified chronic kidney disease: Secondary | ICD-10-CM | POA: Diagnosis not present

## 2022-11-26 ENCOUNTER — Ambulatory Visit (INDEPENDENT_AMBULATORY_CARE_PROVIDER_SITE_OTHER): Payer: PPO

## 2022-11-26 DIAGNOSIS — I442 Atrioventricular block, complete: Secondary | ICD-10-CM | POA: Diagnosis not present

## 2022-11-26 LAB — CUP PACEART REMOTE DEVICE CHECK
Battery Remaining Longevity: 102 mo
Battery Remaining Percentage: 100 %
Brady Statistic RA Percent Paced: 16 %
Brady Statistic RV Percent Paced: 99 %
Date Time Interrogation Session: 20240326011100
Implantable Lead Connection Status: 753985
Implantable Lead Connection Status: 753985
Implantable Lead Connection Status: 753985
Implantable Lead Implant Date: 20200311
Implantable Lead Implant Date: 20200311
Implantable Lead Implant Date: 20200311
Implantable Lead Location: 753858
Implantable Lead Location: 753859
Implantable Lead Location: 753860
Implantable Lead Model: 4674
Implantable Lead Model: 7741
Implantable Lead Model: 7742
Implantable Lead Serial Number: 1022279
Implantable Lead Serial Number: 1098426
Implantable Lead Serial Number: 830411
Implantable Pulse Generator Implant Date: 20200311
Lead Channel Impedance Value: 1050 Ohm
Lead Channel Impedance Value: 667 Ohm
Lead Channel Impedance Value: 962 Ohm
Lead Channel Pacing Threshold Amplitude: 0.6 V
Lead Channel Pacing Threshold Amplitude: 0.6 V
Lead Channel Pacing Threshold Amplitude: 1.3 V
Lead Channel Pacing Threshold Pulse Width: 0.4 ms
Lead Channel Pacing Threshold Pulse Width: 0.4 ms
Lead Channel Pacing Threshold Pulse Width: 0.4 ms
Lead Channel Setting Pacing Amplitude: 2 V
Lead Channel Setting Pacing Amplitude: 2.2 V
Lead Channel Setting Pacing Amplitude: 2.5 V
Lead Channel Setting Pacing Pulse Width: 0.4 ms
Lead Channel Setting Pacing Pulse Width: 0.6 ms
Lead Channel Setting Sensing Sensitivity: 2.5 mV
Lead Channel Setting Sensing Sensitivity: 2.5 mV
Pulse Gen Serial Number: 747113
Zone Setting Status: 755011

## 2022-12-03 DIAGNOSIS — Z1212 Encounter for screening for malignant neoplasm of rectum: Secondary | ICD-10-CM | POA: Diagnosis not present

## 2022-12-03 DIAGNOSIS — Z1211 Encounter for screening for malignant neoplasm of colon: Secondary | ICD-10-CM | POA: Diagnosis not present

## 2022-12-09 ENCOUNTER — Other Ambulatory Visit: Payer: Self-pay | Admitting: Nurse Practitioner

## 2023-01-07 NOTE — Progress Notes (Signed)
Remote pacemaker transmission.   

## 2023-01-15 NOTE — Progress Notes (Unsigned)
Cardiology Office Note   Date:  09/27/2022   ID:  Brittany Shaffer Nov 02, 1945, MRN [id number]  PCP:  Brittany Floro, MD  Cardiologist:  Brittany Strider [place], MD EP: None  Chief Complaint  Patient presents with   Shortness of Breath   Palpitations       History of Present Illness: Brittany Shaffer is a 77 y.o. female with a PMH of chronic combined CHF, AS s/p TAVR, CHB s/p PPM, HLD, who presents for  follow-up.  In 2019  she was admitted  for CP and SOB. She had an echocardiogram which showed EF 50-55%, mild pulmHTN, and moderate aortic stenosis. She underwent a LHC that admission showing near normal coronary arteries with only moderate apical LAD stenosis (50%), mild AS, and moderate MR. She had a repeat echocardiogram in 10/2018 which showed significant drop in EF to 25-30% with restrictive filling parameters, severe pulmHTN, and low flow, low gradient severe aortic stenosis. Repeat cardiac catheterization showed improvement in non-obstructive CAD (30% dLAD stenosis) and moderate-severe low gradient AS with some improvement in EF to 35-45%. She underwent TAVR 11/2018 which was complicated by intraprocedureal CHB managed with CRT-P placed by Dr. Ladona Shaffer. Subsequent device monitoring showed paroxysmal atrial fibrillation with burden <1% and all episodes <20 minutes. Her last echocardiogram 11/2019 showed EF 45-50%, RWMA in the basal region, G2DD, sp TAVR with mild per-valvular regurgitation, mild-moderate TR, mild RAE, and moderate LAE.   Pacer check 08/28/21 showed normal function. No Afib. Repeat Echo September 2022 showed no change. EF 45-50%. Mild perivalvular TAVR leak. No AS. Mild pulmonary HTN.   She notes that in December she had a bad cough that lasted 4 weeks. No fever. Finally resolved. Now back walking and playing tennis. She is getting injections in knee for pain. Tries and limits NSAIDs with some renal insufficiency.  She contacted our office on 06/20/2022 and reported  palpitations while playing tennis 2 days prior. She noted associated back pain and weakness in her legs.  Remote pacer transmission showed normal device function, no evidence of arrhythmia.  She contacted our office again on 06/26/2022 and noted that she had palpitations once again after drinking her coffee with collagen added to it. Follow up device check on Jan 4 showed normal function with no arrhythmia. She did see Dr Brittany Shaffer at that time. It was noted that she wasn't pacing much so AV delay was decreased to allow more BiV pacing and her rate responsiveness was increased. Since these changes she has noted no change in her symptoms.  She tells me that she hasn't felt well since she had an URI in Dec 2022. Since she notes she is SOB with activity. No cough. States she is trying pickleball but gets tired with everything and SOB. Even activities like working in her kitchen, shopping or yard work bring on symptoms.    Past Medical History:  Diagnosis Date   Abnormality of gait 10/20/2014   Arthritis    "was in my hips; still in my knees" (01/13/2018)   Bicuspid aortic valve    Chronic combined systolic and diastolic heart failure (HCC)    DJD (degenerative joint disease), ankle and foot 10/20/2014   HAMMER TOE, ACQUIRED 07/28/2007   Qualifier: Diagnosis of  By: Brittany Shaffer     HLD (hyperlipidemia) 01/14/2018   Metatarsalgia of left foot 10/20/2014   Mild coronary artery disease 01/14/2018   LAD with 50% stenosis on [facility] 12/2017   Mitral regurgitation  OA (osteoarthritis) of hip 03/24/2013   PLANTAR FASCIITIS 07/28/2007   Qualifier: Diagnosis of  By: Brittany Shaffer     RBBB    S/P TAVR (transcatheter aortic valve replacement) 11/10/2018   26 mm Edwards Sapien 3 transcatheter heart valve placed via percutaneous right transfemoral approach    Severe aortic stenosis    Varicose veins of bilateral lower extremities with other complications 09/10/2016    Past Surgical History:  Procedure  Laterality Date   ABDOMINAL HYSTERECTOMY  03/03/2012   Procedure: HYSTERECTOMY ABDOMINAL;  Surgeon: Brittany Corpus, MD;  Location: [facility];  Service: Gynecology;  Laterality: N/A;   BIV PACEMAKER INSERTION CRT-P N/A 11/11/2018   Procedure: BIV PACEMAKER INSERTION CRT-P;  Surgeon: Brittany Maw, MD;  Location: [facility] INVASIVE CV LAB;  Service: Cardiovascular;  Laterality: N/A;   BLEPHAROPLASTY Bilateral    JOINT REPLACEMENT     LAPAROTOMY  03/03/2012   Procedure: EXPLORATORY LAPAROTOMY;  Surgeon: Brittany Corpus, MD;  Location: [facility];  Service: Gynecology;  Laterality: N/A; 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   RIGHT/LEFT HEART CATH AND CORONARY ANGIOGRAPHY N/A 01/14/2018   Procedure: RIGHT/LEFT HEART CATH AND CORONARY ANGIOGRAPHY;  Surgeon: Brittany Ouch, MD;  Location: [facility] INVASIVE CV LAB;  Service: Cardiovascular;  Laterality: N/A;   RIGHT/LEFT HEART CATH AND CORONARY ANGIOGRAPHY N/A 10/23/2018   Procedure: RIGHT/LEFT HEART CATH AND CORONARY ANGIOGRAPHY;  Surgeon: Shaffer, Calyn Sivils M, MD;  Location: [facility] INVASIVE CV LAB;  Service: Cardiovascular;  Laterality: N/A;   SALPINGOOPHORECTOMY  03/03/2012   Procedure: SALPINGO OOPHERECTOMY;  Surgeon: Brittany Corpus, MD;  Location: [facility];  Service: Gynecology;  Laterality: Bilateral;   TEE WITHOUT CARDIOVERSION N/A 11/10/2018   Procedure: TRANSESOPHAGEAL ECHOCARDIOGRAM (TEE);  Surgeon: Brittany Bollman, MD;  Location: [facility] INVASIVE CV LAB;  Service: Open Heart Surgery;  Laterality: N/A;   TONSILLECTOMY     TOTAL HIP ARTHROPLASTY Left 03/24/2013   Procedure: LEFT TOTAL HIP ARTHROPLASTY ANTERIOR APPROACH;  Surgeon: Brittany Drilling, MD;  Location: [facility];  Service: Orthopedics;  Laterality: Left;   TOTAL HIP ARTHROPLASTY Right 12/18/2016   Procedure: RIGHT TOTAL HIP ARTHROPLASTY ANTERIOR APPROACH;  Surgeon: Brittany Gross, MD;  Location: [facility];  Service: Orthopedics;  Laterality: Right;   TRANSCATHETER AORTIC VALVE REPLACEMENT, TRANSFEMORAL N/A  11/10/2018   Procedure: TRANSCATHETER AORTIC VALVE REPLACEMENT, TRANSFEMORAL;  Surgeon: Brittany Bollman, MD;  Location: [facility] INVASIVE CV LAB;  Service: Open Heart Surgery;  Laterality: N/A;   TUBAL LIGATION       Current Outpatient Medications  Medication Sig Dispense Refill   acetaminophen (TYLENOL) 500 MG tablet Take 500-1,000 mg by mouth every 6 (six) hours as needed for moderate pain.     amoxicillin (AMOXIL) 500 MG tablet Take 4 tablets (2,000 mg total) by mouth as directed. 1 hour prior to dental work including cleanings 12 tablet 12   ASPIRIN LOW DOSE 81 MG EC tablet TAKE 1 TABLET BY MOUTH EVERY DAY 90 tablet 3   diclofenac sodium (VOLTAREN) 1 % GEL Apply 1 application topically 4 (four) times daily as needed (pain).     ibuprofen (ADVIL) 200 MG tablet Take 200 mg by mouth every 6 (six) hours as needed.     Menthol-Methyl Salicylate (MUSCLE RUB EX) Apply 1 application topically daily as needed (muscle pain).     sacubitril-valsartan (ENTRESTO) 24-26 MG Take 1 tablet by mouth 2 (two) times daily. (Patient taking differently: Take 0.5 tablets by mouth 2 (two) times daily.) 60 tablet 3   UNABLE TO  FIND Hyland's Leg Cramps tablets: Take 1 tablet by mouth during the night as needed for leg cramps     zolpidem (AMBIEN) 10 MG tablet Take 1 tablet (10 mg total) by mouth at bedtime as needed. For sleep 30 tablet 1   No current facility-administered medications for this visit.    Allergies:   Codeine and Plavix [clopidogrel bisulfate]    Social History:  The patient  reports that she has never smoked. She has never used smokeless tobacco. She reports current alcohol use of about 7.0 standard drinks of alcohol per week. She reports that she does not use drugs.   Family History:  The patient's family history includes Bone cancer in her father; Breast cancer in her maternal aunt; Diabetes in her maternal aunt; Hypertension in her mother; Lung cancer in her father; Ovarian cancer in her sister.     ROS:  Please see the history of present illness.   Otherwise, review of systems are positive for none.   All other systems are reviewed and negative.    PHYSICAL EXAM: VS:  BP 130/72   Pulse 70   Ht 5' 6.5" (1.689 Shaffer)   Wt 156 lb (70.8 kg)   SpO2 99%   BMI 24.80 kg/Shaffer  , BMI Body mass index is 24.8 kg/Shaffer. GEN: Well nourished, well developed, in no acute distress HEENT: normal Neck: no JVD, carotid bruits, or masses Cardiac: RRR; no murmurs, rubs, or gallops,no edema  Respiratory:  clear to auscultation bilaterally, normal work of breathing GI: soft, nontender, nondistended, + BS MS: no deformity or atrophy Skin: warm and dry, no rash Neuro:  Strength and sensation are intact Psych: euthymic mood, full affect   EKG:  EKG not ordered today.     Recent Labs: 07/11/2022: BUN 23; Creatinine, Ser 0.96; Potassium 4.6; Sodium 141    Lipid Panel    Component Value Date/Time   CHOL 193 01/14/2018 0212   TRIG 55 01/14/2018 0212   HDL 85 01/14/2018 0212   CHOLHDL 2.3 01/14/2018 0212   VLDL 11 01/14/2018 0212   LDLCALC 97 01/14/2018 0212   Dated 11/06/20: cholesterol 244, triglycerides 57, HDL 103, LDL 132. CMET normal Dated 11/08/21: cholesterol 236, triglycerides 73, HDL 111, LDL 113. CMET normal.  Dated 11/12/22: A1c 6.1%. cholesterol 250, triglycerides 63, HDL 97, LDL 143. CMET normal.   Wt Readings from Last 3 Encounters:  09/27/22 156 lb (70.8 kg)  09/06/22 156 lb (70.8 kg)  06/27/22 156 lb 6.4 oz (70.9 kg)      Other studies Reviewed: Additional studies/ records that were reviewed today include:   Echocardiogram 11/2019: 1. Left ventricular ejection fraction, by estimation, is 45 to 50%. The  left ventricle has mildly decreased function. The left ventricle  demonstrates regional wall motion abnormalities. Basal anterolateral,  basal inferolateral, and basal inferior severe   hypokinesis. Left ventricular diastolic parameters are consistent with  Grade II diastolic  dysfunction (pseudonormalization).   2. Right ventricular systolic function is normal. The right ventricular  size is mildly enlarged. There is normal pulmonary artery systolic  pressure. The estimated right ventricular systolic pressure is 29.8 mmHg.   3. 26 mm Edwards Sapien bioprosthetic aortic valve s/p TAVR. Mild,  eccentric peri-valvular regurgitation. Mean gradient 15 mmHg with  calculated AVA 1.04 cm^2.   4. The mitral valve is normal in structure. Trivial mitral valve  regurgitation. No evidence of mitral stenosis.   5. Tricuspid valve regurgitation is mild to moderate.   6. Right atrial  size was mildly dilated.   7. Left atrial size was moderately dilated.   8. The inferior vena cava is normal in size with greater than 50%  respiratory variability, suggesting right atrial pressure of 3 mmHg.   R/LHC 10/2018: Dist LAD lesion is 30% stenosed. There is moderate left ventricular systolic dysfunction. The left ventricular ejection fraction is 35-45% by visual estimate. LV end diastolic pressure is normal. There is moderate aortic valve stenosis.   1. Minimal nonobstructive CAD 2. Moderate LV dysfunction. EF 35-40%. 3. At least moderate AS. Mean gradient 27 mm Hg. AVA may be underestimated due to decreased LV function 4. Normal LV filling pressures. 5. Normal right heart pressures.   Plan: will refer to Valvular heart team for consideration of TAVR.   Echo 05/31/21: IMPRESSIONS     1. Left ventricular ejection fraction, by estimation, is 45 to 50%. The  left ventricle has mildly decreased function. The left ventricle  demonstrates regional wall motion abnormalities (see scoring  diagram/findings for description). Left ventricular  diastolic parameters are consistent with Grade I diastolic dysfunction  (impaired relaxation).   2. Right ventricular systolic function is normal. The right ventricular  size is mildly enlarged. There is mildly elevated pulmonary artery  systolic  pressure. The estimated right ventricular systolic pressure is  38.2 mmHg.   3. The mitral valve is normal in structure. No evidence of mitral valve  regurgitation. No evidence of mitral stenosis.   4. Tricuspid valve regurgitation is moderate.   5. Mild perivavlular leak (ventricular septal side). The aortic valve has  been repaired/replaced. Aortic valve regurgitation is not visualized. No  aortic stenosis is present. There is a 26 mm Sapien prosthetic (TAVR)  valve present in the aortic  position. Procedure Date: March 2020. Echo findings are consistent with  perivalvular leak of the aortic prosthesis. Aortic valve mean gradient  measures 10.8 mmHg. Aortic valve Vmax measures 2.19 Shaffer/s.   6. The inferior vena cava is dilated in size with >50% respiratory  variability, suggesting right atrial pressure of 8 mmHg.   Comparison(s): No significant change from prior study. Prior images  reviewed side by side.   Cardiopulmonary exercise test Order: [id number] Status: Final result     Visible to patient: Yes (seen)     Next appt: 01/22/2023 at 09:20 AM in Cardiology (Ashly Goethe [place], MD)     Dx: Dyspnea on exertion   3 Result Notes     1 Patient Communication     Narrative  Referred for: Dyspnea  Procedure: This patient underwent staged symptom-limited exercise treadmill testing using a individualized treadmill protocol with expired gas analysis metabolic evaluation during exercise.  Demographics  Age: 65 Ht. (in.) 66.5 Wt. (lb) 156.8 BMI: 24.9      Predicted Peak VO2: 17.0  Gender: Female Ht (cm) 168.9 Wt. (kg) 156.8   Results  Pre-Exercise PFTs  FVC 2.12 (74%)      FEV1 1.75 (81%)        FEV1/FVC 82 (107%)        MVV 77 (89%)       Exercise Time:    10:30   Speed (mph): 3.0       Grade (%): 10.0    RPE: 19  Reason stopped: dypsnea (8/10)  Additional symptoms: None reported  Resting HR: 70 Standing HR: 70 Peak HR: 126   (89% age predicted max HR)  BP rest:  136/68 Standing BP: 70 BP peak: 186/62  Peak VO2: 21.6 (127% predicted peak  VO2)  VE/VCO2 slope:  44  OUES: 1.30  Peak RER: 1.07  Ventilatory Threshold: 16.3 (96% predicted or 76% measured peak VO2)  Peak RR 60  Peak Ventilation:  69.5  VE/MVV:  90%  PETCO2 at peak:  27  O2pulse:  12   (135% predicted O2pulse)   Interpretation  Notes: Patient gave a very good effort. Pulse-oximetry remained 97-98% for the duration of exercise. Exercise was performed on a treadmill beginning at 2.74mph and 0% grade increasing to steady 3.0 mph and 0% grade with 2.5% grade every other minute.  (see attached document for time-down stages)  ECG:  Resting ECG in normal sinus rhythm with 1st degree AV block and possible IVCD. HR response appropriate. There were no sustained arrhythmias or diagnostic ST-T changes. BP response appropriate.  PFT:  Pre-exercise spirometry was within mostly normal limits. The MVV was normal.  CPX:  Exercise testing with gas exchange demonstrates a normal peak VO2 of 21.6 ml/kg/min (127% of the age/gender/weight matched sedentary norms). The RER of 1.07 indicates a near maximal effort. When adjusted to the patient's ideal body weight of 147.7 lb (67 kg) the peak VO2 is 22.9 ml/kg (ibw)/min (132% of the ibw-adjusted predicted). The VE/VCO2 slope is severely elevated and indicates excessive dead space ventilation. The oxygen uptake efficiency slope (OUES) is normal. The VO2 at the ventilatory threshold was normal at 96% of the predicted peak VO2 and 76% measured PVO2. At peak exercise, the ventilation reached 90% of the measured MVV indicating ventilatory reserve was depleted. The O2pulse (a surrogate for stroke volume) increased with incremental exercise, reaching early flattened peak at 12 ml/beat (135% predicted).  Conclusion: Exercise testing with gas exchange demonstrates normal (Actually excellent) functional capacity when compared to matched sedentary norms. There is no  clear indication for circulatory limitation, however elevated VC/VCO2 slope with history of valvular repair and aortic stenosis, suggests markedly elevated pulmonary pressures with exercise. Recommend further clinical correlation.   Test, report and preliminary impression by: Reggy Eye, MS, ACSM-RCEP 10/17/2022 3:20 PM  PRELIMINARY CPX RESULTS, FINALIZED RESULTS WILL BE FORWARDED WHEN COMPLETED BY INTERPRETING PHYSICIAN.   Attending: Excellent functional capacity when compared to age-adjusted norms. At peak exercise, patient is limited by reaching her ventilatory limits with borderline restriction on resting spirometry. Markedly elevated Ve/VCo2 slope with low PETCO2 suggests exercise-related hyperventilation and not HF.  Arvilla Meres, MD 9:24 PM        ASSESSMENT AND PLAN:  1. Chronic combined CHF/NICM:   - EF was mildly reduced 45-50%. More recent Echo shows improvement to 60-65%.  - no volume overload  - on low dose Entresto- unable to tolerate due to low BP  - Continue to encourage monitoring daily weights and limiting salt intake  2. Non-obstructive CAD: mild distal LAD disease noted on [facility] 10/2018. No anginal complaints.   3. Severe aortic stenosis s/p TAVR 11/2018: stable on last echo  Nov 2023 with mild perivalvular leak noted.  - Continue aspirin - Continue SBE ppx with amoxicillin  4. CHB s/p PPM: normal device function without abnormalities noted on last device interrogation Jan 2024 - some parameters changed to increase BiV pacing.  monitoring per Dr. Ladona Shaffer  5. Dyspnea on exertion. I don't feel this is related to CAD, CHF, or arrhythmia based on above data. She did have PFTs done in Jan 2020 showing mod restrictive defect and decreased diffusion capacity.  CXR showed NAD. Cardiopulmonary stress test showed excellent functional capacity without cardiac limitation. I have recommended formal  pulmonary evaluation. ? If symptoms could be related to something  like myasthenia.       Disposition:   FU with Dr. [place] in 6 months  Signed, Mercede Rollo [place], MD  09/27/2022 3:39 PM

## 2023-01-16 DIAGNOSIS — M1711 Unilateral primary osteoarthritis, right knee: Secondary | ICD-10-CM | POA: Diagnosis not present

## 2023-01-22 ENCOUNTER — Ambulatory Visit: Payer: PPO | Attending: Cardiology | Admitting: Cardiology

## 2023-01-22 ENCOUNTER — Encounter: Payer: Self-pay | Admitting: Cardiology

## 2023-01-22 VITALS — BP 116/78 | HR 64 | Ht 66.5 in | Wt 151.0 lb

## 2023-01-22 DIAGNOSIS — I442 Atrioventricular block, complete: Secondary | ICD-10-CM

## 2023-01-22 DIAGNOSIS — Z950 Presence of cardiac pacemaker: Secondary | ICD-10-CM

## 2023-01-22 DIAGNOSIS — Z952 Presence of prosthetic heart valve: Secondary | ICD-10-CM

## 2023-01-22 DIAGNOSIS — R0602 Shortness of breath: Secondary | ICD-10-CM

## 2023-01-22 DIAGNOSIS — I5042 Chronic combined systolic (congestive) and diastolic (congestive) heart failure: Secondary | ICD-10-CM

## 2023-01-22 NOTE — Patient Instructions (Addendum)
Myasthenia gravis  Medication Instructions:  Continue same medications *If you need a refill on your cardiac medications before your next appointment, please call your pharmacy*   Lab Work: None ordered   Testing/Procedures: None ordered   Follow-Up: At [facility], you and your health needs are our priority.  As part of our continuing mission to provide you with exceptional heart care, we have created designated Provider Care Teams.  These Care Teams include your primary Cardiologist (physician) and Advanced Practice Providers (APPs -  Physician Assistants and Nurse Practitioners) who all work together to provide you with the care you need, when you need it.  We recommend signing up for the patient portal called "MyChart".  Sign up information is provided on this After Visit Summary.  MyChart is used to connect with patients for Virtual Visits (Telemedicine).  Patients are able to view lab/test results, encounter notes, upcoming appointments, etc.  Non-urgent messages can be sent to your provider as well.   To learn more about what you can do with MyChart, go to [email or web address].    Your next appointment:  6 months    Provider:  Dr.Jordan  Pulmonary will call with appointment

## 2023-02-25 ENCOUNTER — Ambulatory Visit (INDEPENDENT_AMBULATORY_CARE_PROVIDER_SITE_OTHER): Payer: PPO

## 2023-02-25 DIAGNOSIS — I442 Atrioventricular block, complete: Secondary | ICD-10-CM

## 2023-02-25 LAB — CUP PACEART REMOTE DEVICE CHECK
Battery Remaining Longevity: 96 mo
Battery Remaining Percentage: 100 %
Brady Statistic RA Percent Paced: 17 %
Brady Statistic RV Percent Paced: 99 %
Date Time Interrogation Session: 20240625011100
Implantable Lead Connection Status: 753985
Implantable Lead Connection Status: 753985
Implantable Lead Connection Status: 753985
Implantable Lead Implant Date: 20200311
Implantable Lead Implant Date: 20200311
Implantable Lead Implant Date: 20200311
Implantable Lead Location: 753858
Implantable Lead Location: 753859
Implantable Lead Location: 753860
Implantable Lead Model: 4674
Implantable Lead Model: 7741
Implantable Lead Model: 7742
Implantable Lead Serial Number: 1022279
Implantable Lead Serial Number: 1098426
Implantable Lead Serial Number: 830411
Implantable Pulse Generator Implant Date: 20200311
Lead Channel Impedance Value: 1086 Ohm
Lead Channel Impedance Value: 1087 Ohm
Lead Channel Impedance Value: 753 Ohm
Lead Channel Pacing Threshold Amplitude: 0.6 V
Lead Channel Pacing Threshold Amplitude: 0.7 V
Lead Channel Pacing Threshold Amplitude: 1.3 V
Lead Channel Pacing Threshold Pulse Width: 0.4 ms
Lead Channel Pacing Threshold Pulse Width: 0.4 ms
Lead Channel Pacing Threshold Pulse Width: 0.4 ms
Lead Channel Setting Pacing Amplitude: 2 V
Lead Channel Setting Pacing Amplitude: 2.2 V
Lead Channel Setting Pacing Amplitude: 2.5 V
Lead Channel Setting Pacing Pulse Width: 0.4 ms
Lead Channel Setting Pacing Pulse Width: 0.6 ms
Lead Channel Setting Sensing Sensitivity: 2.5 mV
Lead Channel Setting Sensing Sensitivity: 2.5 mV
Pulse Gen Serial Number: 747113
Zone Setting Status: 755011

## 2023-03-05 DIAGNOSIS — M1711 Unilateral primary osteoarthritis, right knee: Secondary | ICD-10-CM | POA: Diagnosis not present

## 2023-03-13 DIAGNOSIS — M1711 Unilateral primary osteoarthritis, right knee: Secondary | ICD-10-CM | POA: Diagnosis not present

## 2023-03-19 DIAGNOSIS — M1711 Unilateral primary osteoarthritis, right knee: Secondary | ICD-10-CM | POA: Diagnosis not present

## 2023-03-19 NOTE — Progress Notes (Signed)
 Remote pacemaker transmission.   

## 2023-04-01 ENCOUNTER — Institutional Professional Consult (permissible substitution): Payer: PPO | Admitting: Internal Medicine

## 2023-04-03 DIAGNOSIS — Z6823 Body mass index (BMI) 23.0-23.9, adult: Secondary | ICD-10-CM | POA: Diagnosis not present

## 2023-04-03 DIAGNOSIS — R030 Elevated blood-pressure reading, without diagnosis of hypertension: Secondary | ICD-10-CM | POA: Diagnosis not present

## 2023-04-03 DIAGNOSIS — G4700 Insomnia, unspecified: Secondary | ICD-10-CM | POA: Diagnosis not present

## 2023-04-29 DIAGNOSIS — U071 COVID-19: Secondary | ICD-10-CM | POA: Diagnosis not present

## 2023-05-27 ENCOUNTER — Ambulatory Visit: Payer: PPO

## 2023-05-27 DIAGNOSIS — I442 Atrioventricular block, complete: Secondary | ICD-10-CM

## 2023-05-27 LAB — CUP PACEART REMOTE DEVICE CHECK
Battery Remaining Longevity: 96 mo
Battery Remaining Percentage: 100 %
Brady Statistic RA Percent Paced: 20 %
Brady Statistic RV Percent Paced: 98 %
Date Time Interrogation Session: 20240924011100
Implantable Lead Connection Status: 753985
Implantable Lead Connection Status: 753985
Implantable Lead Connection Status: 753985
Implantable Lead Implant Date: 20200311
Implantable Lead Implant Date: 20200311
Implantable Lead Implant Date: 20200311
Implantable Lead Location: 753858
Implantable Lead Location: 753859
Implantable Lead Location: 753860
Implantable Lead Model: 4674
Implantable Lead Model: 7741
Implantable Lead Model: 7742
Implantable Lead Serial Number: 1022279
Implantable Lead Serial Number: 1098426
Implantable Lead Serial Number: 830411
Implantable Pulse Generator Implant Date: 20200311
Lead Channel Impedance Value: 1027 Ohm
Lead Channel Impedance Value: 1051 Ohm
Lead Channel Impedance Value: 751 Ohm
Lead Channel Pacing Threshold Amplitude: 0.6 V
Lead Channel Pacing Threshold Amplitude: 0.7 V
Lead Channel Pacing Threshold Amplitude: 1.3 V
Lead Channel Pacing Threshold Pulse Width: 0.4 ms
Lead Channel Pacing Threshold Pulse Width: 0.4 ms
Lead Channel Pacing Threshold Pulse Width: 0.4 ms
Lead Channel Setting Pacing Amplitude: 2 V
Lead Channel Setting Pacing Amplitude: 2.2 V
Lead Channel Setting Pacing Amplitude: 2.5 V
Lead Channel Setting Pacing Pulse Width: 0.4 ms
Lead Channel Setting Pacing Pulse Width: 0.6 ms
Lead Channel Setting Sensing Sensitivity: 2.5 mV
Lead Channel Setting Sensing Sensitivity: 2.5 mV
Pulse Gen Serial Number: 747113
Zone Setting Status: 755011

## 2023-06-03 DIAGNOSIS — Z1231 Encounter for screening mammogram for malignant neoplasm of breast: Secondary | ICD-10-CM | POA: Diagnosis not present

## 2023-06-12 NOTE — Progress Notes (Signed)
Remote pacemaker transmission.   

## 2023-06-30 DIAGNOSIS — L821 Other seborrheic keratosis: Secondary | ICD-10-CM | POA: Diagnosis not present

## 2023-06-30 DIAGNOSIS — L814 Other melanin hyperpigmentation: Secondary | ICD-10-CM | POA: Diagnosis not present

## 2023-06-30 DIAGNOSIS — D485 Neoplasm of uncertain behavior of skin: Secondary | ICD-10-CM | POA: Diagnosis not present

## 2023-06-30 DIAGNOSIS — D1801 Hemangioma of skin and subcutaneous tissue: Secondary | ICD-10-CM | POA: Diagnosis not present

## 2023-06-30 DIAGNOSIS — L578 Other skin changes due to chronic exposure to nonionizing radiation: Secondary | ICD-10-CM | POA: Diagnosis not present

## 2023-07-09 NOTE — Progress Notes (Signed)
Cardiology Office Note   Date:  07/15/2023   ID:  Anuhya, Surgeon [birth year], MRN [id number]  PCP:  Daisy Floro, MD  Cardiologist:  Netasha Wehrli [place], MD EP: None  Chief Complaint  Patient presents with   Aortic Stenosis       History of Present Illness: Brittany Shaffer is a 77 y.o. female with a PMH of chronic combined CHF, AS s/p TAVR, CHB s/p PPM, HLD, who presents for  follow-up.  In 2019  she was admitted  for CP and SOB. She had an echocardiogram which showed EF 50-55%, mild pulmHTN, and moderate aortic stenosis. She underwent a LHC that admission showing near normal coronary arteries with only moderate apical LAD stenosis (50%), mild AS, and moderate MR. She had a repeat echocardiogram in 10/2018 which showed significant drop in EF to 25-30% with restrictive filling parameters, severe pulmHTN, and low flow, low gradient severe aortic stenosis. Repeat cardiac catheterization showed improvement in non-obstructive CAD (30% dLAD stenosis) and moderate-severe low gradient AS with some improvement in EF to 35-45%. She underwent TAVR 11/2018 which was complicated by intraprocedureal CHB managed with CRT-P placed by Dr. Ladona Ridgel. Subsequent device monitoring showed paroxysmal atrial fibrillation with burden <1% and all episodes <20 minutes. Her last echocardiogram 11/2019 showed EF 45-50%, RWMA in the basal region, G2DD, sp TAVR with mild per-valvular regurgitation, mild-moderate TR, mild RAE, and moderate LAE.   Pacer check 08/28/21 showed normal function. No Afib. Repeat Echo September 2022 showed no change. EF 45-50%. Mild perivalvular TAVR leak. No AS. Mild pulmonary HTN.   She notes that in December she had a bad cough that lasted 4 weeks. No fever. Finally resolved. Now back walking and playing tennis. She is getting injections in knee for pain. Tries and limits NSAIDs with some renal insufficiency.  She contacted our office on 06/20/2022 and reported palpitations while  playing tennis 2 days prior. She noted associated back pain and weakness in her legs.  Remote pacer transmission showed normal device function, no evidence of arrhythmia.  She contacted our office again on 06/26/2022 and noted that she had palpitations once again after drinking her coffee with collagen added to it. Follow up device check on Jan 4 showed normal function with no arrhythmia. She did see Dr Ladona Ridgel at that time. It was noted that she wasn't pacing much so AV delay was decreased to allow more BiV pacing and her rate responsiveness was increased. Since these changes she has noted no change in her symptoms.  On follow up today she is stable. Playing pickleball. Still gets SOB in the heat or raking leaves. Notes some cramps in her legs at night and her feet stay cold. No chest pain   Past Medical History:  Diagnosis Date   Abnormality of gait 10/20/2014   Arthritis    "was in my hips; still in my knees" (01/13/2018)   Bicuspid aortic valve    Chronic combined systolic and diastolic heart failure (HCC)    DJD (degenerative joint disease), ankle and foot 10/20/2014   HAMMER TOE, ACQUIRED 07/28/2007   Qualifier: Diagnosis of  By: Copland MD, Spencer     HLD (hyperlipidemia) 01/14/2018   Metatarsalgia of left foot 10/20/2014   Mild coronary artery disease 01/14/2018   LAD with 50% stenosis on [facility] 12/2017   Mitral regurgitation    OA (osteoarthritis) of hip 03/24/2013   PLANTAR FASCIITIS 07/28/2007   Qualifier: Diagnosis of  By: Patsy Lager MD, Karleen Hampshire  RBBB    S/P TAVR (transcatheter aortic valve replacement) 11/10/2018   26 mm Edwards Sapien 3 transcatheter heart valve placed via percutaneous right transfemoral approach    Severe aortic stenosis    Varicose veins of bilateral lower extremities with other complications 09/10/2016    Past Surgical History:  Procedure Laterality Date   ABDOMINAL HYSTERECTOMY  03/03/2012   Procedure: HYSTERECTOMY ABDOMINAL;  Surgeon: Jeannette Corpus, MD;   Location: [facility];  Service: Gynecology;  Laterality: N/A;   BIV PACEMAKER INSERTION CRT-P N/A 11/11/2018   Procedure: BIV PACEMAKER INSERTION CRT-P;  Surgeon: Marinus Maw, MD;  Location: [facility] INVASIVE CV LAB;  Service: Cardiovascular;  Laterality: N/A;   BLEPHAROPLASTY Bilateral    JOINT REPLACEMENT     LAPAROTOMY  03/03/2012   Procedure: EXPLORATORY LAPAROTOMY;  Surgeon: Jeannette Corpus, MD;  Location: [facility];  Service: Gynecology;  Laterality: N/A; 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   RIGHT/LEFT HEART CATH AND CORONARY ANGIOGRAPHY N/A 01/14/2018   Procedure: RIGHT/LEFT HEART CATH AND CORONARY ANGIOGRAPHY;  Surgeon: Iran Ouch, MD;  Location: [facility] INVASIVE CV LAB;  Service: Cardiovascular;  Laterality: N/A;   RIGHT/LEFT HEART CATH AND CORONARY ANGIOGRAPHY N/A 10/23/2018   Procedure: RIGHT/LEFT HEART CATH AND CORONARY ANGIOGRAPHY;  Surgeon: [place], Dionta Larke M, MD;  Location: [facility] INVASIVE CV LAB;  Service: Cardiovascular;  Laterality: N/A;   SALPINGOOPHORECTOMY  03/03/2012   Procedure: SALPINGO OOPHERECTOMY;  Surgeon: Jeannette Corpus, MD;  Location: [facility];  Service: Gynecology;  Laterality: Bilateral;   TEE WITHOUT CARDIOVERSION N/A 11/10/2018   Procedure: TRANSESOPHAGEAL ECHOCARDIOGRAM (TEE);  Surgeon: Tonny Bollman, MD;  Location: [facility] INVASIVE CV LAB;  Service: Open Heart Surgery;  Laterality: N/A;   TONSILLECTOMY     TOTAL HIP ARTHROPLASTY Left 03/24/2013   Procedure: LEFT TOTAL HIP ARTHROPLASTY ANTERIOR APPROACH;  Surgeon: Loanne Drilling, MD;  Location: [facility];  Service: Orthopedics;  Laterality: Left;   TOTAL HIP ARTHROPLASTY Right 12/18/2016   Procedure: RIGHT TOTAL HIP ARTHROPLASTY ANTERIOR APPROACH;  Surgeon: Ollen Gross, MD;  Location: [facility];  Service: Orthopedics;  Laterality: Right;   TRANSCATHETER AORTIC VALVE REPLACEMENT, TRANSFEMORAL N/A 11/10/2018   Procedure: TRANSCATHETER AORTIC VALVE REPLACEMENT, TRANSFEMORAL;  Surgeon: Tonny Bollman, MD;  Location: [facility] INVASIVE CV  LAB;  Service: Open Heart Surgery;  Laterality: N/A;   TUBAL LIGATION       Current Outpatient Medications  Medication Sig Dispense Refill   acetaminophen (TYLENOL) 500 MG tablet Take 500-1,000 mg by mouth every 6 (six) hours as needed for moderate pain.     amoxicillin (AMOXIL) 500 MG tablet Take 4 tablets (2,000 mg total) by mouth as directed. 1 hour prior to dental work including cleanings 12 tablet 12   ASPIRIN LOW DOSE 81 MG tablet TAKE 1 TABLET BY MOUTH EVERY DAY 90 tablet 3   diclofenac sodium (VOLTAREN) 1 % GEL Apply 1 application topically 4 (four) times daily as needed (pain).     ENTRESTO 24-26 MG TAKE 1 TABLET BY MOUTH TWICE A DAY (Patient taking differently: Take 0.5 tablets by mouth 2 (two) times daily.) 60 tablet 3   ibuprofen (ADVIL) 200 MG tablet Take 200 mg by mouth every 6 (six) hours as needed.     Ibuprofen-Acetaminophen (ADVIL DUAL ACTION PO) Take by mouth as needed.     Menthol-Methyl Salicylate (MUSCLE RUB EX) Apply 1 application topically daily as needed (muscle pain).     UNABLE TO FIND Hyland's Leg Cramps tablets: Take 1 tablet by mouth during the night  as needed for leg cramps     zolpidem (AMBIEN) 10 MG tablet Take 1 tablet (10 mg total) by mouth at bedtime as needed. For sleep 30 tablet 1   No current facility-administered medications for this visit.    Allergies:   Codeine and Plavix [clopidogrel bisulfate]    Social History:  The patient  reports that she has never smoked. She has never used smokeless tobacco. She reports current alcohol use of about 7.0 standard drinks of alcohol per week. She reports that she does not use drugs.   Family History:  The patient's family history includes Bone cancer in her father; Breast cancer in her maternal aunt; Diabetes in her maternal aunt; Hypertension in her mother; Lung cancer in her father; Ovarian cancer in her sister.    ROS:  Please see the history of present illness.   Otherwise, review of systems are positive  for none.   All other systems are reviewed and negative.    PHYSICAL EXAM: VS:  BP (!) 140/76 (BP Location: Left Arm, Patient Position: Sitting, Cuff Size: Normal)   Pulse 68   Ht 5\' 6"  (1.676 m)   Wt 149 lb 6.4 oz (67.8 kg)   SpO2 98%   BMI 24.11 kg/m  , BMI Body mass index is 24.11 kg/m. GEN: Well nourished, well developed, in no acute distress HEENT: normal Neck: no JVD, carotid bruits, or masses Cardiac: RRR; no murmurs, rubs, or gallops,no edema  Respiratory:  clear to auscultation bilaterally, normal work of breathing GI: soft, nontender, nondistended, + BS MS: no deformity or atrophy Skin: warm and dry, no rash Neuro:  Strength and sensation are intact Psych: euthymic mood, full affect   EKG:  EKG not ordered today.     Recent Labs: No results found for requested labs within last 365 days.    Lipid Panel    Component Value Date/Time   CHOL 193 01/14/2018 0212   TRIG 55 01/14/2018 0212   HDL 85 01/14/2018 0212   CHOLHDL 2.3 01/14/2018 0212   VLDL 11 01/14/2018 0212   LDLCALC 97 01/14/2018 0212   Dated 11/06/20: cholesterol 244, triglycerides 57, HDL 103, LDL 132. CMET normal Dated 11/08/21: cholesterol 236, triglycerides 73, HDL 111, LDL 113. CMET normal.  Dated 11/12/22: A1c 6.1%. cholesterol 250, triglycerides 63, HDL 97, LDL 143. CMET normal.   Wt Readings from Last 3 Encounters:  07/15/23 149 lb 6.4 oz (67.8 kg)  01/22/23 151 lb (68.5 kg)  09/27/22 156 lb (70.8 kg)      Other studies Reviewed: Additional studies/ records that were reviewed today include:   Echocardiogram 11/2019: 1. Left ventricular ejection fraction, by estimation, is 45 to 50%. The  left ventricle has mildly decreased function. The left ventricle  demonstrates regional wall motion abnormalities. Basal anterolateral,  basal inferolateral, and basal inferior severe   hypokinesis. Left ventricular diastolic parameters are consistent with  Grade II diastolic dysfunction  (pseudonormalization).   2. Right ventricular systolic function is normal. The right ventricular  size is mildly enlarged. There is normal pulmonary artery systolic  pressure. The estimated right ventricular systolic pressure is 29.8 mmHg.   3. 26 mm Edwards Sapien bioprosthetic aortic valve s/p TAVR. Mild,  eccentric peri-valvular regurgitation. Mean gradient 15 mmHg with  calculated AVA 1.04 cm^2.   4. The mitral valve is normal in structure. Trivial mitral valve  regurgitation. No evidence of mitral stenosis.   5. Tricuspid valve regurgitation is mild to moderate.   6. Right atrial  size was mildly dilated.   7. Left atrial size was moderately dilated.   8. The inferior vena cava is normal in size with greater than 50%  respiratory variability, suggesting right atrial pressure of 3 mmHg.   R/LHC 10/2018: Dist LAD lesion is 30% stenosed. There is moderate left ventricular systolic dysfunction. The left ventricular ejection fraction is 35-45% by visual estimate. LV end diastolic pressure is normal. There is moderate aortic valve stenosis.   1. Minimal nonobstructive CAD 2. Moderate LV dysfunction. EF 35-40%. 3. At least moderate AS. Mean gradient 27 mm Hg. AVA may be underestimated due to decreased LV function 4. Normal LV filling pressures. 5. Normal right heart pressures.   Plan: will refer to Valvular heart team for consideration of TAVR.   Echo 05/31/21: IMPRESSIONS     1. Left ventricular ejection fraction, by estimation, is 45 to 50%. The  left ventricle has mildly decreased function. The left ventricle  demonstrates regional wall motion abnormalities (see scoring  diagram/findings for description). Left ventricular  diastolic parameters are consistent with Grade I diastolic dysfunction  (impaired relaxation).   2. Right ventricular systolic function is normal. The right ventricular  size is mildly enlarged. There is mildly elevated pulmonary artery  systolic pressure.  The estimated right ventricular systolic pressure is  38.2 mmHg.   3. The mitral valve is normal in structure. No evidence of mitral valve  regurgitation. No evidence of mitral stenosis.   4. Tricuspid valve regurgitation is moderate.   5. Mild perivavlular leak (ventricular septal side). The aortic valve has  been repaired/replaced. Aortic valve regurgitation is not visualized. No  aortic stenosis is present. There is a 26 mm Sapien prosthetic (TAVR)  valve present in the aortic  position. Procedure Date: March 2020. Echo findings are consistent with  perivalvular leak of the aortic prosthesis. Aortic valve mean gradient  measures 10.8 mmHg. Aortic valve Vmax measures 2.19 m/s.   6. The inferior vena cava is dilated in size with >50% respiratory  variability, suggesting right atrial pressure of 8 mmHg.   Comparison(s): No significant change from prior study. Prior images  reviewed side by side.   Cardiopulmonary exercise test Order: [id number] Status: Final result     Visible to patient: Yes (seen)     Next appt: 01/22/2023 at 09:20 AM in Cardiology (Mitzi Lilja [place], MD)     Dx: Dyspnea on exertion   3 Result Notes     1 Patient Communication     Narrative  Referred for: Dyspnea  Procedure: This patient underwent staged symptom-limited exercise treadmill testing using a individualized treadmill protocol with expired gas analysis metabolic evaluation during exercise.  Demographics  Age: 35 Ht. (in.) 66.5 Wt. (lb) 156.8 BMI: 24.9      Predicted Peak VO2: 17.0  Gender: Female Ht (cm) 168.9 Wt. (kg) 156.8   Results  Pre-Exercise PFTs  FVC 2.12 (74%)      FEV1 1.75 (81%)        FEV1/FVC 82 (107%)        MVV 77 (89%)       Exercise Time:    10:30   Speed (mph): 3.0       Grade (%): 10.0    RPE: 19  Reason stopped: dypsnea (8/10)  Additional symptoms: None reported  Resting HR: 70 Standing HR: 70 Peak HR: 126   (89% age predicted max HR)  BP rest: 136/68  Standing BP: 70 BP peak: 186/62  Peak VO2: 21.6 (127% predicted peak  VO2)  VE/VCO2 slope:  44  OUES: 1.30  Peak RER: 1.07  Ventilatory Threshold: 16.3 (96% predicted or 76% measured peak VO2)  Peak RR 60  Peak Ventilation:  69.5  VE/MVV:  90%  PETCO2 at peak:  27  O2pulse:  12   (135% predicted O2pulse)   Interpretation  Notes: Patient gave a very good effort. Pulse-oximetry remained 97-98% for the duration of exercise. Exercise was performed on a treadmill beginning at 2.31mph and 0% grade increasing to steady 3.0 mph and 0% grade with 2.5% grade every other minute.  (see attached document for time-down stages)  ECG:  Resting ECG in normal sinus rhythm with 1st degree AV block and possible IVCD. HR response appropriate. There were no sustained arrhythmias or diagnostic ST-T changes. BP response appropriate.  PFT:  Pre-exercise spirometry was within mostly normal limits. The MVV was normal.  CPX:  Exercise testing with gas exchange demonstrates a normal peak VO2 of 21.6 ml/kg/min (127% of the age/gender/weight matched sedentary norms). The RER of 1.07 indicates a near maximal effort. When adjusted to the patient's ideal body weight of 147.7 lb (67 kg) the peak VO2 is 22.9 ml/kg (ibw)/min (132% of the ibw-adjusted predicted). The VE/VCO2 slope is severely elevated and indicates excessive dead space ventilation. The oxygen uptake efficiency slope (OUES) is normal. The VO2 at the ventilatory threshold was normal at 96% of the predicted peak VO2 and 76% measured PVO2. At peak exercise, the ventilation reached 90% of the measured MVV indicating ventilatory reserve was depleted. The O2pulse (a surrogate for stroke volume) increased with incremental exercise, reaching early flattened peak at 12 ml/beat (135% predicted).  Conclusion: Exercise testing with gas exchange demonstrates normal (Actually excellent) functional capacity when compared to matched sedentary norms. There is no clear  indication for circulatory limitation, however elevated VC/VCO2 slope with history of valvular repair and aortic stenosis, suggests markedly elevated pulmonary pressures with exercise. Recommend further clinical correlation.   Test, report and preliminary impression by: Reggy Eye, MS, ACSM-RCEP 10/17/2022 3:20 PM  PRELIMINARY CPX RESULTS, FINALIZED RESULTS WILL BE FORWARDED WHEN COMPLETED BY INTERPRETING PHYSICIAN.   Attending: Excellent functional capacity when compared to age-adjusted norms. At peak exercise, patient is limited by reaching her ventilatory limits with borderline restriction on resting spirometry. Markedly elevated Ve/VCo2 slope with low PETCO2 suggests exercise-related hyperventilation and not HF.  Arvilla Meres, MD 9:24 PM        ASSESSMENT AND PLAN:  1. Chronic combined CHF/NICM:   - EF was mildly reduced 45-50%. More recent Echo shows improvement to 60-65%.  - no volume overload  - on low dose Entresto- unable to tolerate due feeling wiped out on higher dose.  - Continue to encourage monitoring daily weights and limiting salt intake  2. Non-obstructive CAD: mild distal LAD disease noted on [facility] 10/2018. No anginal complaints.   3. Severe aortic stenosis s/p TAVR 11/2018: stable on last echo  Nov 2023 with mild perivalvular leak noted.  - Continue aspirin - Continue SBE ppx with amoxicillin  4. CHB s/p PPM: normal device function without abnormalities noted on last device interrogation Jan 2024 - some parameters changed to increase BiV pacing.  monitoring per Dr. Ladona Ridgel  5. Dyspnea on exertion. I don't feel this is related to CAD, CHF, or arrhythmia based on above data. She did have PFTs done in Jan 2020 showing mod restrictive defect and decreased diffusion capacity.  CXR showed NAD. Cardiopulmonary stress test showed excellent functional capacity without cardiac limitation. If  symptoms persist consider pulmonary evaluation   Disposition:   FU   in 6 months  Signed, Alwaleed Obeso [place], MD  07/15/2023 3:17 PM

## 2023-07-15 ENCOUNTER — Ambulatory Visit: Payer: PPO | Attending: Cardiology | Admitting: Cardiology

## 2023-07-15 ENCOUNTER — Encounter: Payer: Self-pay | Admitting: Cardiology

## 2023-07-15 VITALS — BP 140/76 | HR 68 | Ht 66.0 in | Wt 149.4 lb

## 2023-07-15 DIAGNOSIS — Z952 Presence of prosthetic heart valve: Secondary | ICD-10-CM

## 2023-07-15 DIAGNOSIS — Z950 Presence of cardiac pacemaker: Secondary | ICD-10-CM | POA: Diagnosis not present

## 2023-07-15 DIAGNOSIS — I442 Atrioventricular block, complete: Secondary | ICD-10-CM | POA: Diagnosis not present

## 2023-07-15 DIAGNOSIS — R0602 Shortness of breath: Secondary | ICD-10-CM

## 2023-07-15 DIAGNOSIS — R0609 Other forms of dyspnea: Secondary | ICD-10-CM | POA: Diagnosis not present

## 2023-07-15 NOTE — Patient Instructions (Signed)

## 2023-07-19 ENCOUNTER — Other Ambulatory Visit: Payer: Self-pay | Admitting: Cardiology

## 2023-07-22 DIAGNOSIS — H25812 Combined forms of age-related cataract, left eye: Secondary | ICD-10-CM | POA: Diagnosis not present

## 2023-07-22 DIAGNOSIS — H40013 Open angle with borderline findings, low risk, bilateral: Secondary | ICD-10-CM | POA: Diagnosis not present

## 2023-07-22 DIAGNOSIS — Z961 Presence of intraocular lens: Secondary | ICD-10-CM | POA: Diagnosis not present

## 2023-08-14 DIAGNOSIS — H2512 Age-related nuclear cataract, left eye: Secondary | ICD-10-CM | POA: Diagnosis not present

## 2023-08-14 DIAGNOSIS — Z961 Presence of intraocular lens: Secondary | ICD-10-CM | POA: Diagnosis not present

## 2023-08-14 DIAGNOSIS — H40013 Open angle with borderline findings, low risk, bilateral: Secondary | ICD-10-CM | POA: Diagnosis not present

## 2023-08-14 DIAGNOSIS — H18413 Arcus senilis, bilateral: Secondary | ICD-10-CM | POA: Diagnosis not present

## 2023-08-26 ENCOUNTER — Ambulatory Visit (INDEPENDENT_AMBULATORY_CARE_PROVIDER_SITE_OTHER): Payer: PPO

## 2023-08-26 DIAGNOSIS — I442 Atrioventricular block, complete: Secondary | ICD-10-CM | POA: Diagnosis not present

## 2023-08-26 LAB — CUP PACEART REMOTE DEVICE CHECK
Battery Remaining Longevity: 96 mo
Battery Remaining Percentage: 100 %
Brady Statistic RA Percent Paced: 19 %
Brady Statistic RV Percent Paced: 99 %
Date Time Interrogation Session: 20241224011200
Implantable Lead Connection Status: 753985
Implantable Lead Connection Status: 753985
Implantable Lead Connection Status: 753985
Implantable Lead Implant Date: 20200311
Implantable Lead Implant Date: 20200311
Implantable Lead Implant Date: 20200311
Implantable Lead Location: 753858
Implantable Lead Location: 753859
Implantable Lead Location: 753860
Implantable Lead Model: 4674
Implantable Lead Model: 7741
Implantable Lead Model: 7742
Implantable Lead Serial Number: 1022279
Implantable Lead Serial Number: 1098426
Implantable Lead Serial Number: 830411
Implantable Pulse Generator Implant Date: 20200311
Lead Channel Impedance Value: 1046 Ohm
Lead Channel Impedance Value: 1097 Ohm
Lead Channel Impedance Value: 741 Ohm
Lead Channel Pacing Threshold Amplitude: 0.6 V
Lead Channel Pacing Threshold Amplitude: 0.7 V
Lead Channel Pacing Threshold Amplitude: 1.3 V
Lead Channel Pacing Threshold Pulse Width: 0.4 ms
Lead Channel Pacing Threshold Pulse Width: 0.4 ms
Lead Channel Pacing Threshold Pulse Width: 0.4 ms
Lead Channel Setting Pacing Amplitude: 2 V
Lead Channel Setting Pacing Amplitude: 2.2 V
Lead Channel Setting Pacing Amplitude: 2.5 V
Lead Channel Setting Pacing Pulse Width: 0.4 ms
Lead Channel Setting Pacing Pulse Width: 0.6 ms
Lead Channel Setting Sensing Sensitivity: 2.5 mV
Lead Channel Setting Sensing Sensitivity: 2.5 mV
Pulse Gen Serial Number: 747113
Zone Setting Status: 755011

## 2023-10-06 NOTE — Progress Notes (Signed)
 Remote pacemaker transmission.

## 2023-10-29 DIAGNOSIS — M25511 Pain in right shoulder: Secondary | ICD-10-CM | POA: Diagnosis not present

## 2023-10-31 DIAGNOSIS — M25511 Pain in right shoulder: Secondary | ICD-10-CM | POA: Diagnosis not present

## 2023-11-18 DIAGNOSIS — Z79899 Other long term (current) drug therapy: Secondary | ICD-10-CM | POA: Diagnosis not present

## 2023-11-18 DIAGNOSIS — R7303 Prediabetes: Secondary | ICD-10-CM | POA: Diagnosis not present

## 2023-11-18 DIAGNOSIS — E7800 Pure hypercholesterolemia, unspecified: Secondary | ICD-10-CM | POA: Diagnosis not present

## 2023-11-20 DIAGNOSIS — G4700 Insomnia, unspecified: Secondary | ICD-10-CM | POA: Diagnosis not present

## 2023-11-20 DIAGNOSIS — N183 Chronic kidney disease, stage 3 unspecified: Secondary | ICD-10-CM | POA: Diagnosis not present

## 2023-11-20 DIAGNOSIS — Z79899 Other long term (current) drug therapy: Secondary | ICD-10-CM | POA: Diagnosis not present

## 2023-11-20 DIAGNOSIS — E7800 Pure hypercholesterolemia, unspecified: Secondary | ICD-10-CM | POA: Diagnosis not present

## 2023-11-20 DIAGNOSIS — Z6824 Body mass index (BMI) 24.0-24.9, adult: Secondary | ICD-10-CM | POA: Diagnosis not present

## 2023-11-20 DIAGNOSIS — Z0000 Encounter for general adult medical examination without abnormal findings: Secondary | ICD-10-CM | POA: Diagnosis not present

## 2023-11-20 DIAGNOSIS — R7303 Prediabetes: Secondary | ICD-10-CM | POA: Diagnosis not present

## 2023-11-25 ENCOUNTER — Encounter: Payer: Self-pay | Admitting: Internal Medicine

## 2023-11-25 ENCOUNTER — Ambulatory Visit: Payer: PPO

## 2023-11-25 DIAGNOSIS — I442 Atrioventricular block, complete: Secondary | ICD-10-CM | POA: Diagnosis not present

## 2023-11-25 LAB — CUP PACEART REMOTE DEVICE CHECK
Battery Remaining Longevity: 90 mo
Battery Remaining Percentage: 100 %
Brady Statistic RA Percent Paced: 18 %
Brady Statistic RV Percent Paced: 99 %
Date Time Interrogation Session: 20250325012400
Implantable Lead Connection Status: 753985
Implantable Lead Connection Status: 753985
Implantable Lead Connection Status: 753985
Implantable Lead Implant Date: 20200311
Implantable Lead Implant Date: 20200311
Implantable Lead Implant Date: 20200311
Implantable Lead Location: 753858
Implantable Lead Location: 753859
Implantable Lead Location: 753860
Implantable Lead Model: 4674
Implantable Lead Model: 7741
Implantable Lead Model: 7742
Implantable Lead Serial Number: 1022279
Implantable Lead Serial Number: 1098426
Implantable Lead Serial Number: 830411
Implantable Pulse Generator Implant Date: 20200311
Lead Channel Impedance Value: 1228 Ohm
Lead Channel Impedance Value: 719 Ohm
Lead Channel Impedance Value: 989 Ohm
Lead Channel Pacing Threshold Amplitude: 0.6 V
Lead Channel Pacing Threshold Amplitude: 0.8 V
Lead Channel Pacing Threshold Amplitude: 1.3 V
Lead Channel Pacing Threshold Pulse Width: 0.4 ms
Lead Channel Pacing Threshold Pulse Width: 0.4 ms
Lead Channel Pacing Threshold Pulse Width: 0.4 ms
Lead Channel Setting Pacing Amplitude: 2 V
Lead Channel Setting Pacing Amplitude: 2.2 V
Lead Channel Setting Pacing Amplitude: 2.5 V
Lead Channel Setting Pacing Pulse Width: 0.4 ms
Lead Channel Setting Pacing Pulse Width: 0.6 ms
Lead Channel Setting Sensing Sensitivity: 2.5 mV
Lead Channel Setting Sensing Sensitivity: 2.5 mV
Pulse Gen Serial Number: 747113
Zone Setting Status: 755011

## 2023-12-03 ENCOUNTER — Other Ambulatory Visit: Payer: Self-pay | Admitting: Cardiology

## 2023-12-11 DIAGNOSIS — F5104 Psychophysiologic insomnia: Secondary | ICD-10-CM | POA: Diagnosis not present

## 2023-12-11 DIAGNOSIS — G4762 Sleep related leg cramps: Secondary | ICD-10-CM | POA: Diagnosis not present

## 2023-12-11 DIAGNOSIS — F418 Other specified anxiety disorders: Secondary | ICD-10-CM | POA: Diagnosis not present

## 2023-12-11 DIAGNOSIS — R252 Cramp and spasm: Secondary | ICD-10-CM | POA: Diagnosis not present

## 2023-12-19 ENCOUNTER — Other Ambulatory Visit: Payer: Self-pay | Admitting: Cardiology

## 2024-01-08 NOTE — Progress Notes (Signed)
 Remote pacemaker transmission.

## 2024-02-20 NOTE — Progress Notes (Unsigned)
 Cardiology Office Note   Date:  02/20/2024   ID:  Brittany Shaffer [birth year], MRN [id number]  PCP:  Jimmey Mould, MD  Cardiologist:  Tresa Jolley [place], MD EP: None  No chief complaint on file.      History of Present Illness: Brittany Shaffer is a 78 y.o. female with a PMH of chronic combined CHF, AS s/p TAVR, CHB s/p PPM, HLD, who presents for  follow-up.  In 2019  she was admitted  for CP and SOB. She had an echocardiogram which showed EF 50-55%, mild pulmHTN, and moderate aortic stenosis. She underwent a LHC that admission showing near normal coronary arteries with only moderate apical LAD stenosis (50%), mild AS, and moderate MR. She had a repeat echocardiogram in 10/2018 which showed significant drop in EF to 25-30% with restrictive filling parameters, severe pulmHTN, and low flow, low gradient severe aortic stenosis. Repeat cardiac catheterization showed improvement in non-obstructive CAD (30% dLAD stenosis) and moderate-severe low gradient AS with some improvement in EF to 35-45%. She underwent TAVR 11/2018 which was complicated by intraprocedureal CHB managed with CRT-P placed by Dr. Carolynne Citron. Subsequent device monitoring showed paroxysmal atrial fibrillation with burden <1% and all episodes <20 minutes. Her last echocardiogram 11/2019 showed EF 45-50%, RWMA in the basal region, G2DD, sp TAVR with mild per-valvular regurgitation, mild-moderate TR, mild RAE, and moderate LAE.   Pacer check 08/28/21 showed normal function. No Afib. Repeat Echo September 2022 showed no change. EF 45-50%. Mild perivalvular TAVR leak. No AS. Mild pulmonary HTN.   She notes that in December she had a bad cough that lasted 4 weeks. No fever. Finally resolved. Now back walking and playing tennis. She is getting injections in knee for pain. Tries and limits NSAIDs with some renal insufficiency.  She contacted our office on 06/20/2022 and reported palpitations while playing tennis 2 days prior. She noted  associated back pain and weakness in her legs.  Remote pacer transmission showed normal device function, no evidence of arrhythmia.  She contacted our office again on 06/26/2022 and noted that she had palpitations once again after drinking her coffee with collagen added to it. Follow up device check on Jan 4 showed normal function with no arrhythmia. She did see Dr Carolynne Citron at that time. It was noted that she wasn't pacing much so AV delay was decreased to allow more BiV pacing and her rate responsiveness was increased. Since these changes she has noted no change in her symptoms.  On follow up today she is stable. Playing pickleball. Still gets SOB in the heat or raking leaves. Notes some cramps in her legs at night and her feet stay cold. No chest pain   Past Medical History:  Diagnosis Date   Abnormality of gait 10/20/2014   Arthritis    was in my hips; still in my knees (01/13/2018)   Bicuspid aortic valve    Chronic combined systolic and diastolic heart failure (HCC)    DJD (degenerative joint disease), ankle and foot 10/20/2014   HAMMER TOE, ACQUIRED 07/28/2007   Qualifier: Diagnosis of  By: Copland MD, Spencer     HLD (hyperlipidemia) 01/14/2018   Metatarsalgia of left foot 10/20/2014   Mild coronary artery disease 01/14/2018   LAD with 50% stenosis on [facility] 12/2017   Mitral regurgitation    OA (osteoarthritis) of hip 03/24/2013   PLANTAR FASCIITIS 07/28/2007   Qualifier: Diagnosis of  By: Copland MD, Spencer     RBBB    S/P  TAVR (transcatheter aortic valve replacement) 11/10/2018   26 mm Edwards Sapien 3 transcatheter heart valve placed via percutaneous right transfemoral approach    Severe aortic stenosis    Varicose veins of bilateral lower extremities with other complications 09/10/2016    Past Surgical History:  Procedure Laterality Date   ABDOMINAL HYSTERECTOMY  03/03/2012   Procedure: HYSTERECTOMY ABDOMINAL;  Surgeon: Verdia Glad, MD;  Location: [facility];  Service:  Gynecology;  Laterality: N/A;   BIV PACEMAKER INSERTION CRT-P N/A 11/11/2018   Procedure: BIV PACEMAKER INSERTION CRT-P;  Surgeon: Tammie Fall, MD;  Location: [facility] INVASIVE CV LAB;  Service: Cardiovascular;  Laterality: N/A;   BLEPHAROPLASTY Bilateral    JOINT REPLACEMENT     LAPAROTOMY  03/03/2012   Procedure: EXPLORATORY LAPAROTOMY;  Surgeon: Verdia Glad, MD;  Location: [facility];  Service: Gynecology;  Laterality: N/A; 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   RIGHT/LEFT HEART CATH AND CORONARY ANGIOGRAPHY N/A 01/14/2018   Procedure: RIGHT/LEFT HEART CATH AND CORONARY ANGIOGRAPHY;  Surgeon: Wenona Hamilton, MD;  Location: [facility] INVASIVE CV LAB;  Service: Cardiovascular;  Laterality: N/A;   RIGHT/LEFT HEART CATH AND CORONARY ANGIOGRAPHY N/A 10/23/2018   Procedure: RIGHT/LEFT HEART CATH AND CORONARY ANGIOGRAPHY;  Surgeon: [place], Alden Bensinger M, MD;  Location: [facility] INVASIVE CV LAB;  Service: Cardiovascular;  Laterality: N/A;   SALPINGOOPHORECTOMY  03/03/2012   Procedure: SALPINGO OOPHERECTOMY;  Surgeon: Verdia Glad, MD;  Location: [facility];  Service: Gynecology;  Laterality: Bilateral;   TEE WITHOUT CARDIOVERSION N/A 11/10/2018   Procedure: TRANSESOPHAGEAL ECHOCARDIOGRAM (TEE);  Surgeon: Arnoldo Lapping, MD;  Location: [facility] INVASIVE CV LAB;  Service: Open Heart Surgery;  Laterality: N/A;   TONSILLECTOMY     TOTAL HIP ARTHROPLASTY Left 03/24/2013   Procedure: LEFT TOTAL HIP ARTHROPLASTY ANTERIOR APPROACH;  Surgeon: Aurther Blue, MD;  Location: [facility];  Service: Orthopedics;  Laterality: Left;   TOTAL HIP ARTHROPLASTY Right 12/18/2016   Procedure: RIGHT TOTAL HIP ARTHROPLASTY ANTERIOR APPROACH;  Surgeon: Liliane Rei, MD;  Location: [facility];  Service: Orthopedics;  Laterality: Right;   TRANSCATHETER AORTIC VALVE REPLACEMENT, TRANSFEMORAL N/A 11/10/2018   Procedure: TRANSCATHETER AORTIC VALVE REPLACEMENT, TRANSFEMORAL;  Surgeon: Arnoldo Lapping, MD;  Location: [facility] INVASIVE CV LAB;  Service: Open Heart  Surgery;  Laterality: N/A;   TUBAL LIGATION       Current Outpatient Medications  Medication Sig Dispense Refill   acetaminophen  (TYLENOL ) 500 MG tablet Take 500-1,000 mg by mouth every 6 (six) hours as needed for moderate pain.     amoxicillin  (AMOXIL ) 500 MG tablet Take 4 tablets (2,000 mg total) by mouth as directed. 1 hour prior to dental work including cleanings 12 tablet 12   aspirin  EC (ASPIRIN  LOW DOSE) 81 MG tablet TAKE 1 TABLET BY MOUTH EVERY DAY 90 tablet 1   diclofenac  sodium (VOLTAREN ) 1 % GEL Apply 1 application topically 4 (four) times daily as needed (pain).     ibuprofen (ADVIL) 200 MG tablet Take 200 mg by mouth every 6 (six) hours as needed.     Ibuprofen-Acetaminophen  (ADVIL DUAL ACTION PO) Take by mouth as needed.     Menthol -Methyl Salicylate (MUSCLE RUB EX) Apply 1 application topically daily as needed (muscle pain).     sacubitril -valsartan  (ENTRESTO ) 24-26 MG TAKE 1 TABLET BY MOUTH TWICE A DAY 180 tablet 2   UNABLE TO FIND Hyland's Leg Cramps tablets: Take 1 tablet by mouth during the night as needed for leg cramps     zolpidem  (AMBIEN ) 10 MG tablet  Take 1 tablet (10 mg total) by mouth at bedtime as needed. For sleep 30 tablet 1   No current facility-administered medications for this visit.    Allergies:   Codeine and Plavix  [clopidogrel  bisulfate]    Social History:  The patient  reports that she has never smoked. She has never used smokeless tobacco. She reports current alcohol use of about 7.0 standard drinks of alcohol per week. She reports that she does not use drugs.   Family History:  The patient's family history includes Bone cancer in her father; Breast cancer in her maternal aunt; Diabetes in her maternal aunt; Hypertension in her mother; Lung cancer in her father; Ovarian cancer in her sister.    ROS:  Please see the history of present illness.   Otherwise, review of systems are positive for none.   All other systems are reviewed and negative.     PHYSICAL EXAM: VS:  There were no vitals taken for this visit. , BMI There is no height or weight on file to calculate BMI. GEN: Well nourished, well developed, in no acute distress HEENT: normal Neck: no JVD, carotid bruits, or masses Cardiac: RRR; no murmurs, rubs, or gallops,no edema  Respiratory:  clear to auscultation bilaterally, normal work of breathing GI: soft, nontender, nondistended, + BS MS: no deformity or atrophy Skin: warm and dry, no rash Neuro:  Strength and sensation are intact Psych: euthymic mood, full affect   EKG:  EKG not ordered today.     Recent Labs: No results found for requested labs within last 365 days.    Lipid Panel    Component Value Date/Time   CHOL 193 01/14/2018 0212   TRIG 55 01/14/2018 0212   HDL 85 01/14/2018 0212   CHOLHDL 2.3 01/14/2018 0212   VLDL 11 01/14/2018 0212   LDLCALC 97 01/14/2018 0212   Dated 11/06/20: cholesterol 244, triglycerides 57, HDL 103, LDL 132. CMET normal Dated 11/08/21: cholesterol 236, triglycerides 73, HDL 111, LDL 113. CMET normal.  Dated 11/12/22: A1c 6.1%. cholesterol 250, triglycerides 63, HDL 97, LDL 143. CMET normal.   Wt Readings from Last 3 Encounters:  07/15/23 149 lb 6.4 oz (67.8 kg)  01/22/23 151 lb (68.5 kg)  09/27/22 156 lb (70.8 kg)      Other studies Reviewed: Additional studies/ records that were reviewed today include:   Echocardiogram 11/2019: 1. Left ventricular ejection fraction, by estimation, is 45 to 50%. The  left ventricle has mildly decreased function. The left ventricle  demonstrates regional wall motion abnormalities. Basal anterolateral,  basal inferolateral, and basal inferior severe   hypokinesis. Left ventricular diastolic parameters are consistent with  Grade II diastolic dysfunction (pseudonormalization).   2. Right ventricular systolic function is normal. The right ventricular  size is mildly enlarged. There is normal pulmonary artery systolic  pressure. The  estimated right ventricular systolic pressure is 29.8 mmHg.   3. 26 mm Edwards Sapien bioprosthetic aortic valve s/p TAVR. Mild,  eccentric peri-valvular regurgitation. Mean gradient 15 mmHg with  calculated AVA 1.04 cm^2.   4. The mitral valve is normal in structure. Trivial mitral valve  regurgitation. No evidence of mitral stenosis.   5. Tricuspid valve regurgitation is mild to moderate.   6. Right atrial size was mildly dilated.   7. Left atrial size was moderately dilated.   8. The inferior vena cava is normal in size with greater than 50%  respiratory variability, suggesting right atrial pressure of 3 mmHg.   R/LHC 10/2018: Dist LAD  lesion is 30% stenosed. There is moderate left ventricular systolic dysfunction. The left ventricular ejection fraction is 35-45% by visual estimate. LV end diastolic pressure is normal. There is moderate aortic valve stenosis.   1. Minimal nonobstructive CAD 2. Moderate LV dysfunction. EF 35-40%. 3. At least moderate AS. Mean gradient 27 mm Hg. AVA may be underestimated due to decreased LV function 4. Normal LV filling pressures. 5. Normal right heart pressures.   Plan: will refer to Valvular heart team for consideration of TAVR.   Echo 05/31/21: IMPRESSIONS     1. Left ventricular ejection fraction, by estimation, is 45 to 50%. The  left ventricle has mildly decreased function. The left ventricle  demonstrates regional wall motion abnormalities (see scoring  diagram/findings for description). Left ventricular  diastolic parameters are consistent with Grade I diastolic dysfunction  (impaired relaxation).   2. Right ventricular systolic function is normal. The right ventricular  size is mildly enlarged. There is mildly elevated pulmonary artery  systolic pressure. The estimated right ventricular systolic pressure is  38.2 mmHg.   3. The mitral valve is normal in structure. No evidence of mitral valve  regurgitation. No evidence of mitral  stenosis.   4. Tricuspid valve regurgitation is moderate.   5. Mild perivavlular leak (ventricular septal side). The aortic valve has  been repaired/replaced. Aortic valve regurgitation is not visualized. No  aortic stenosis is present. There is a 26 mm Sapien prosthetic (TAVR)  valve present in the aortic  position. Procedure Date: March 2020. Echo findings are consistent with  perivalvular leak of the aortic prosthesis. Aortic valve mean gradient  measures 10.8 mmHg. Aortic valve Vmax measures 2.19 m/s.   6. The inferior vena cava is dilated in size with >50% respiratory  variability, suggesting right atrial pressure of 8 mmHg.   Comparison(s): No significant change from prior study. Prior images  reviewed side by side.   Cardiopulmonary exercise test Order: [id number] Status: Final result     Visible to patient: Yes (seen)     Next appt: 01/22/2023 at 09:20 AM in Cardiology (Debany Vantol [place], MD)     Dx: Dyspnea on exertion   3 Result Notes     1 Patient Communication     Narrative  Referred for: Dyspnea  Procedure: This patient underwent staged symptom-limited exercise treadmill testing using a individualized treadmill protocol with expired gas analysis metabolic evaluation during exercise.  Demographics  Age: 20 Ht. (in.) 66.5 Wt. (lb) 156.8 BMI: 24.9      Predicted Peak VO2: 17.0  Gender: Female Ht (cm) 168.9 Wt. (kg) 156.8   Results  Pre-Exercise PFTs  FVC 2.12 (74%)      FEV1 1.75 (81%)        FEV1/FVC 82 (107%)        MVV 77 (89%)       Exercise Time:    10:30   Speed (mph): 3.0       Grade (%): 10.0    RPE: 19  Reason stopped: dypsnea (8/10)  Additional symptoms: None reported  Resting HR: 70 Standing HR: 70 Peak HR: 126   (89% age predicted max HR)  BP rest: 136/68 Standing BP: 70 BP peak: 186/62  Peak VO2: 21.6 (127% predicted peak VO2)  VE/VCO2 slope:  44  OUES: 1.30  Peak RER: 1.07  Ventilatory Threshold: 16.3 (96% predicted or 76%  measured peak VO2)  Peak RR 60  Peak Ventilation:  69.5  VE/MVV:  90%  PETCO2 at peak:  27  O2pulse:  12   (135% predicted O2pulse)   Interpretation  Notes: Patient gave a very good effort. Pulse-oximetry remained 97-98% for the duration of exercise. Exercise was performed on a treadmill beginning at 2.69mph and 0% grade increasing to steady 3.0 mph and 0% grade with 2.5% grade every other minute.  (see attached document for time-down stages)  ECG:  Resting ECG in normal sinus rhythm with 1st degree AV block and possible IVCD. HR response appropriate. There were no sustained arrhythmias or diagnostic ST-T changes. BP response appropriate.  PFT:  Pre-exercise spirometry was within mostly normal limits. The MVV was normal.  CPX:  Exercise testing with gas exchange demonstrates a normal peak VO2 of 21.6 ml/kg/min (127% of the age/gender/weight matched sedentary norms). The RER of 1.07 indicates a near maximal effort. When adjusted to the patient's ideal body weight of 147.7 lb (67 kg) the peak VO2 is 22.9 ml/kg (ibw)/min (132% of the ibw-adjusted predicted). The VE/VCO2 slope is severely elevated and indicates excessive dead space ventilation. The oxygen uptake efficiency slope (OUES) is normal. The VO2 at the ventilatory threshold was normal at 96% of the predicted peak VO2 and 76% measured PVO2. At peak exercise, the ventilation reached 90% of the measured MVV indicating ventilatory reserve was depleted. The O2pulse (a surrogate for stroke volume) increased with incremental exercise, reaching early flattened peak at 12 ml/beat (135% predicted).  Conclusion: Exercise testing with gas exchange demonstrates normal (Actually excellent) functional capacity when compared to matched sedentary norms. There is no clear indication for circulatory limitation, however elevated VC/VCO2 slope with history of valvular repair and aortic stenosis, suggests markedly elevated pulmonary pressures with exercise.  Recommend further clinical correlation.   Test, report and preliminary impression by: Agustina Aldrich, MS, ACSM-RCEP 10/17/2022 3:20 PM  PRELIMINARY CPX RESULTS, FINALIZED RESULTS WILL BE FORWARDED WHEN COMPLETED BY INTERPRETING PHYSICIAN.   Attending: Excellent functional capacity when compared to age-adjusted norms. At peak exercise, patient is limited by reaching her ventilatory limits with borderline restriction on resting spirometry. Markedly elevated Ve/VCo2 slope with low PETCO2 suggests exercise-related hyperventilation and not HF.  Jules Oar, MD 9:24 PM        ASSESSMENT AND PLAN:  1. Chronic combined CHF/NICM:   - EF was mildly reduced 45-50%. More recent Echo shows improvement to 60-65%.  - no volume overload  - on low dose Entresto - unable to tolerate due feeling wiped out on higher dose.  - Continue to encourage monitoring daily weights and limiting salt intake  2. Non-obstructive CAD: mild distal LAD disease noted on [facility] 10/2018. No anginal complaints.   3. Severe aortic stenosis s/p TAVR 11/2018: stable on last echo  Nov 2023 with mild perivalvular leak noted.  - Continue aspirin  - Continue SBE ppx with amoxicillin   4. CHB s/p PPM: normal device function without abnormalities noted on last device interrogation Jan 2024 - some parameters changed to increase BiV pacing.  monitoring per Dr. Carolynne Citron  5. Dyspnea on exertion. I don't feel this is related to CAD, CHF, or arrhythmia based on above data. She did have PFTs done in Jan 2020 showing mod restrictive defect and decreased diffusion capacity.  CXR showed NAD. Cardiopulmonary stress test showed excellent functional capacity without cardiac limitation. If symptoms persist consider pulmonary evaluation   Disposition:   FU  in 6 months  Signed, Kash Mothershead [place], MD  02/20/2024 7:18 AM

## 2024-02-23 ENCOUNTER — Ambulatory Visit: Attending: Cardiology | Admitting: Cardiology

## 2024-02-23 ENCOUNTER — Encounter: Payer: Self-pay | Admitting: Cardiology

## 2024-02-23 VITALS — BP 132/74 | HR 60 | Ht 66.0 in | Wt 149.2 lb

## 2024-02-23 DIAGNOSIS — I442 Atrioventricular block, complete: Secondary | ICD-10-CM | POA: Diagnosis not present

## 2024-02-23 DIAGNOSIS — Z950 Presence of cardiac pacemaker: Secondary | ICD-10-CM

## 2024-02-23 DIAGNOSIS — Z952 Presence of prosthetic heart valve: Secondary | ICD-10-CM | POA: Diagnosis not present

## 2024-02-23 NOTE — Patient Instructions (Signed)
 Medication Instructions:  Continue same medications *If you need a refill on your cardiac medications before your next appointment, please call your pharmacy*  Lab Work: None ordered  Testing/Procedures: None ordered  Follow-Up: At [facility], you and your health needs are our priority.  As part of our continuing mission to provide you with exceptional heart care, our providers are all part of one team.  This team includes your primary Cardiologist (physician) and Advanced Practice Providers or APPs (Physician Assistants and Nurse Practitioners) who all work together to provide you with the care you need, when you need it.  Your next appointment:  6 months    Call in July to schedule Dec appointment     Provider:  Dr.Jordan    We recommend signing up for the patient portal called MyChart.  Sign up information is provided on this After Visit Summary.  MyChart is used to connect with patients for Virtual Visits (Telemedicine).  Patients are able to view lab/test results, encounter notes, upcoming appointments, etc.  Non-urgent messages can be sent to your provider as well.   To learn more about what you can do with MyChart, go to [email or web address].

## 2024-02-24 ENCOUNTER — Ambulatory Visit (INDEPENDENT_AMBULATORY_CARE_PROVIDER_SITE_OTHER): Payer: PPO

## 2024-02-24 DIAGNOSIS — I442 Atrioventricular block, complete: Secondary | ICD-10-CM | POA: Diagnosis not present

## 2024-02-24 LAB — CUP PACEART REMOTE DEVICE CHECK
Battery Remaining Longevity: 90 mo
Battery Remaining Percentage: 98 %
Brady Statistic RA Percent Paced: 18 %
Brady Statistic RV Percent Paced: 99 %
Date Time Interrogation Session: 20250624011100
Implantable Lead Connection Status: 753985
Implantable Lead Connection Status: 753985
Implantable Lead Connection Status: 753985
Implantable Lead Implant Date: 20200311
Implantable Lead Implant Date: 20200311
Implantable Lead Implant Date: 20200311
Implantable Lead Location: 753858
Implantable Lead Location: 753859
Implantable Lead Location: 753860
Implantable Lead Model: 4674
Implantable Lead Model: 7741
Implantable Lead Model: 7742
Implantable Lead Serial Number: 1022279
Implantable Lead Serial Number: 1098426
Implantable Lead Serial Number: 830411
Implantable Pulse Generator Implant Date: 20200311
Lead Channel Impedance Value: 1248 Ohm
Lead Channel Impedance Value: 705 Ohm
Lead Channel Impedance Value: 993 Ohm
Lead Channel Pacing Threshold Amplitude: 0.6 V
Lead Channel Pacing Threshold Amplitude: 0.7 V
Lead Channel Pacing Threshold Amplitude: 1.3 V
Lead Channel Pacing Threshold Pulse Width: 0.4 ms
Lead Channel Pacing Threshold Pulse Width: 0.4 ms
Lead Channel Pacing Threshold Pulse Width: 0.4 ms
Lead Channel Setting Pacing Amplitude: 2 V
Lead Channel Setting Pacing Amplitude: 2.2 V
Lead Channel Setting Pacing Amplitude: 2.5 V
Lead Channel Setting Pacing Pulse Width: 0.4 ms
Lead Channel Setting Pacing Pulse Width: 0.6 ms
Lead Channel Setting Sensing Sensitivity: 2.5 mV
Lead Channel Setting Sensing Sensitivity: 2.5 mV
Pulse Gen Serial Number: 747113
Zone Setting Status: 755011

## 2024-02-29 ENCOUNTER — Ambulatory Visit: Payer: Self-pay | Admitting: Internal Medicine

## 2024-05-25 ENCOUNTER — Ambulatory Visit (INDEPENDENT_AMBULATORY_CARE_PROVIDER_SITE_OTHER): Payer: PPO

## 2024-05-25 DIAGNOSIS — I442 Atrioventricular block, complete: Secondary | ICD-10-CM | POA: Diagnosis not present

## 2024-05-26 LAB — CUP PACEART REMOTE DEVICE CHECK
Battery Remaining Longevity: 90 mo
Battery Remaining Percentage: 97 %
Brady Statistic RA Percent Paced: 19 %
Brady Statistic RV Percent Paced: 99 %
Date Time Interrogation Session: 20250923100600
Implantable Lead Connection Status: 753985
Implantable Lead Connection Status: 753985
Implantable Lead Connection Status: 753985
Implantable Lead Implant Date: 20200311
Implantable Lead Implant Date: 20200311
Implantable Lead Implant Date: 20200311
Implantable Lead Location: 753858
Implantable Lead Location: 753859
Implantable Lead Location: 753860
Implantable Lead Model: 4674
Implantable Lead Model: 7741
Implantable Lead Model: 7742
Implantable Lead Serial Number: 1022279
Implantable Lead Serial Number: 1098426
Implantable Lead Serial Number: 830411
Implantable Pulse Generator Implant Date: 20200311
Lead Channel Impedance Value: 1025 Ohm
Lead Channel Impedance Value: 1308 Ohm
Lead Channel Impedance Value: 680 Ohm
Lead Channel Pacing Threshold Amplitude: 0.6 V
Lead Channel Pacing Threshold Amplitude: 0.8 V
Lead Channel Pacing Threshold Amplitude: 1.3 V
Lead Channel Pacing Threshold Pulse Width: 0.4 ms
Lead Channel Pacing Threshold Pulse Width: 0.4 ms
Lead Channel Pacing Threshold Pulse Width: 0.4 ms
Lead Channel Setting Pacing Amplitude: 2 V
Lead Channel Setting Pacing Amplitude: 2.2 V
Lead Channel Setting Pacing Amplitude: 2.5 V
Lead Channel Setting Pacing Pulse Width: 0.4 ms
Lead Channel Setting Pacing Pulse Width: 0.6 ms
Lead Channel Setting Sensing Sensitivity: 2.5 mV
Lead Channel Setting Sensing Sensitivity: 2.5 mV
Pulse Gen Serial Number: 747113
Zone Setting Status: 755011

## 2024-05-26 NOTE — Progress Notes (Signed)
 Remote PPM Transmission

## 2024-05-27 NOTE — Progress Notes (Signed)
 Remote PPM Transmission

## 2024-05-30 ENCOUNTER — Ambulatory Visit: Payer: Self-pay | Admitting: Internal Medicine

## 2024-06-03 ENCOUNTER — Other Ambulatory Visit: Payer: Self-pay | Admitting: Cardiology

## 2024-06-08 DIAGNOSIS — Z1231 Encounter for screening mammogram for malignant neoplasm of breast: Secondary | ICD-10-CM | POA: Diagnosis not present

## 2024-07-22 DIAGNOSIS — Z961 Presence of intraocular lens: Secondary | ICD-10-CM | POA: Diagnosis not present

## 2024-07-22 DIAGNOSIS — H40013 Open angle with borderline findings, low risk, bilateral: Secondary | ICD-10-CM | POA: Diagnosis not present

## 2024-08-24 ENCOUNTER — Ambulatory Visit: Payer: PPO

## 2024-08-24 DIAGNOSIS — I442 Atrioventricular block, complete: Secondary | ICD-10-CM | POA: Diagnosis not present

## 2024-08-24 LAB — CUP PACEART REMOTE DEVICE CHECK
Battery Remaining Longevity: 78 mo
Battery Remaining Percentage: 90 %
Brady Statistic RA Percent Paced: 19 %
Brady Statistic RV Percent Paced: 99 %
Date Time Interrogation Session: 20251223014400
Implantable Lead Connection Status: 753985
Implantable Lead Connection Status: 753985
Implantable Lead Connection Status: 753985
Implantable Lead Implant Date: 20200311
Implantable Lead Implant Date: 20200311
Implantable Lead Implant Date: 20200311
Implantable Lead Location: 753858
Implantable Lead Location: 753859
Implantable Lead Location: 753860
Implantable Lead Model: 4674
Implantable Lead Model: 7741
Implantable Lead Model: 7742
Implantable Lead Serial Number: 1022279
Implantable Lead Serial Number: 1098426
Implantable Lead Serial Number: 830411
Implantable Pulse Generator Implant Date: 20200311
Lead Channel Impedance Value: 1403 Ohm
Lead Channel Impedance Value: 699 Ohm
Lead Channel Impedance Value: 994 Ohm
Lead Channel Pacing Threshold Amplitude: 0.6 V
Lead Channel Pacing Threshold Amplitude: 0.8 V
Lead Channel Pacing Threshold Amplitude: 1.3 V
Lead Channel Pacing Threshold Pulse Width: 0.4 ms
Lead Channel Pacing Threshold Pulse Width: 0.4 ms
Lead Channel Pacing Threshold Pulse Width: 0.4 ms
Lead Channel Setting Pacing Amplitude: 2 V
Lead Channel Setting Pacing Amplitude: 2.2 V
Lead Channel Setting Pacing Amplitude: 2.5 V
Lead Channel Setting Pacing Pulse Width: 0.4 ms
Lead Channel Setting Pacing Pulse Width: 0.6 ms
Lead Channel Setting Sensing Sensitivity: 2.5 mV
Lead Channel Setting Sensing Sensitivity: 2.5 mV
Pulse Gen Serial Number: 747113
Zone Setting Status: 755011

## 2024-08-25 NOTE — Progress Notes (Signed)
 Remote PPM Transmission

## 2024-08-29 ENCOUNTER — Ambulatory Visit: Payer: Self-pay | Admitting: Internal Medicine

## 2024-09-14 ENCOUNTER — Telehealth: Payer: Self-pay | Admitting: Cardiology

## 2024-09-14 NOTE — Telephone Encounter (Signed)
*  STAT* If patient is at the pharmacy, call can be transferred to refill team.   1. Which medications need to be refilled? (please list name of each medication and dose if known) sacubitril -valsartan  (ENTRESTO ) 24-26 MG   2. Which pharmacy/location (including street and city if local pharmacy) is medication to be sent to?  [id number] - [place], [place] - [address]    3. Do they need a 30 day or 90 day supply? 90

## 2024-09-17 MED ORDER — SACUBITRIL-VALSARTAN 24-26 MG PO TABS: 1.0000 | ORAL_TABLET | Freq: Two times a day (BID) | ORAL | 0 refills | Status: AC

## 2024-09-17 NOTE — Telephone Encounter (Signed)
 Refill sent

## 2024-10-22 ENCOUNTER — Ambulatory Visit: Admitting: Cardiology
